# Patient Record
Sex: Female | Born: 1955 | Race: White | Hispanic: No | Smoking: Former smoker
Health system: Southern US, Community
[De-identification: ages and names within clinical notes are randomized; demographics above are authoritative.]

## PROBLEM LIST (undated history)

## (undated) DIAGNOSIS — I5189 Other ill-defined heart diseases: Secondary | ICD-10-CM

## (undated) DIAGNOSIS — R0789 Other chest pain: Secondary | ICD-10-CM

## (undated) DIAGNOSIS — Z8489 Family history of other specified conditions: Secondary | ICD-10-CM

## (undated) DIAGNOSIS — F1090 Alcohol use, unspecified, uncomplicated: Secondary | ICD-10-CM

## (undated) DIAGNOSIS — I872 Venous insufficiency (chronic) (peripheral): Secondary | ICD-10-CM

## (undated) DIAGNOSIS — I779 Disorder of arteries and arterioles, unspecified: Secondary | ICD-10-CM

## (undated) DIAGNOSIS — F40240 Claustrophobia: Secondary | ICD-10-CM

## (undated) DIAGNOSIS — K7689 Other specified diseases of liver: Secondary | ICD-10-CM

## (undated) DIAGNOSIS — R011 Cardiac murmur, unspecified: Secondary | ICD-10-CM

## (undated) DIAGNOSIS — D649 Anemia, unspecified: Secondary | ICD-10-CM

## (undated) DIAGNOSIS — I499 Cardiac arrhythmia, unspecified: Secondary | ICD-10-CM

## (undated) DIAGNOSIS — J189 Pneumonia, unspecified organism: Secondary | ICD-10-CM

## (undated) DIAGNOSIS — I2510 Atherosclerotic heart disease of native coronary artery without angina pectoris: Secondary | ICD-10-CM

## (undated) DIAGNOSIS — K5190 Ulcerative colitis, unspecified, without complications: Secondary | ICD-10-CM

## (undated) HISTORY — DX: Other ill-defined heart diseases: I51.89

## (undated) HISTORY — PX: FOOT SURGERY: SHX648

## (undated) HISTORY — DX: Venous insufficiency (chronic) (peripheral): I87.2

## (undated) HISTORY — DX: Other chest pain: R07.89

## (undated) HISTORY — DX: Ulcerative colitis, unspecified, without complications: K51.90

## (undated) HISTORY — PX: MOUTH SURGERY: SHX715

## (undated) HISTORY — PX: COLON SURGERY: SHX602

## (undated) HISTORY — PX: COLOSTOMY TAKEDOWN: SHX5783

## (undated) HISTORY — DX: Disorder of arteries and arterioles, unspecified: I77.9

## (undated) SURGERY — LAPAROSCOPIC CHOLECYSTECTOMY
Anesthesia: Choice

---

## 1989-09-21 HISTORY — PX: MANDIBLE FRACTURE SURGERY: SHX706

## 2004-11-09 ENCOUNTER — Emergency Department: Payer: Self-pay | Admitting: Emergency Medicine

## 2005-10-09 ENCOUNTER — Emergency Department: Payer: Self-pay | Admitting: Internal Medicine

## 2005-10-09 ENCOUNTER — Other Ambulatory Visit: Payer: Self-pay

## 2006-12-18 ENCOUNTER — Emergency Department: Payer: Self-pay | Admitting: Emergency Medicine

## 2007-04-02 ENCOUNTER — Ambulatory Visit: Payer: Self-pay | Admitting: Family Medicine

## 2009-01-21 ENCOUNTER — Emergency Department: Payer: Self-pay | Admitting: Emergency Medicine

## 2010-09-11 ENCOUNTER — Ambulatory Visit: Payer: Self-pay | Admitting: Obstetrics and Gynecology

## 2010-09-18 ENCOUNTER — Ambulatory Visit: Payer: Self-pay | Admitting: Obstetrics and Gynecology

## 2010-11-21 ENCOUNTER — Ambulatory Visit: Payer: Self-pay | Admitting: Unknown Physician Specialty

## 2011-10-08 ENCOUNTER — Ambulatory Visit: Payer: Self-pay | Admitting: Unknown Physician Specialty

## 2011-10-14 ENCOUNTER — Ambulatory Visit: Payer: Self-pay | Admitting: Obstetrics and Gynecology

## 2011-11-02 ENCOUNTER — Ambulatory Visit: Payer: Self-pay | Admitting: Surgery

## 2011-11-02 HISTORY — PX: BREAST BIOPSY: SHX20

## 2011-11-07 ENCOUNTER — Emergency Department: Payer: Self-pay | Admitting: Emergency Medicine

## 2012-12-09 ENCOUNTER — Other Ambulatory Visit: Payer: Self-pay | Admitting: Unknown Physician Specialty

## 2012-12-12 ENCOUNTER — Ambulatory Visit: Payer: Self-pay | Admitting: Unknown Physician Specialty

## 2013-01-05 ENCOUNTER — Ambulatory Visit: Payer: Self-pay | Admitting: Unknown Physician Specialty

## 2013-02-21 ENCOUNTER — Other Ambulatory Visit: Payer: Self-pay | Admitting: Unknown Physician Specialty

## 2013-02-21 LAB — CLOSTRIDIUM DIFFICILE BY PCR

## 2013-02-24 LAB — STOOL CULTURE

## 2013-08-22 DIAGNOSIS — K5190 Ulcerative colitis, unspecified, without complications: Secondary | ICD-10-CM | POA: Insufficient documentation

## 2014-02-23 ENCOUNTER — Emergency Department: Payer: Self-pay | Admitting: Emergency Medicine

## 2014-02-23 LAB — URINALYSIS, COMPLETE
BILIRUBIN, UR: NEGATIVE
BLOOD: NEGATIVE
Bacteria: NONE SEEN
Glucose,UR: NEGATIVE mg/dL (ref 0–75)
Ketone: NEGATIVE
Nitrite: NEGATIVE
Ph: 6 (ref 4.5–8.0)
Protein: NEGATIVE
Specific Gravity: 1.016 (ref 1.003–1.030)
Squamous Epithelial: 2

## 2014-02-23 LAB — CBC WITH DIFFERENTIAL/PLATELET
Basophil #: 0.1 10*3/uL (ref 0.0–0.1)
Basophil %: 0.5 %
EOS ABS: 0.1 10*3/uL (ref 0.0–0.7)
EOS PCT: 0.8 %
HCT: 33.8 % — AB (ref 35.0–47.0)
HGB: 10.7 g/dL — ABNORMAL LOW (ref 12.0–16.0)
LYMPHS ABS: 1.5 10*3/uL (ref 1.0–3.6)
LYMPHS PCT: 10.6 %
MCH: 25.7 pg — ABNORMAL LOW (ref 26.0–34.0)
MCHC: 31.5 g/dL — AB (ref 32.0–36.0)
MCV: 81 fL (ref 80–100)
MONOS PCT: 10.7 %
Monocyte #: 1.5 x10 3/mm — ABNORMAL HIGH (ref 0.2–0.9)
NEUTROS ABS: 11 10*3/uL — AB (ref 1.4–6.5)
Neutrophil %: 77.4 %
PLATELETS: 284 10*3/uL (ref 150–440)
RBC: 4.15 10*6/uL (ref 3.80–5.20)
RDW: 18 % — ABNORMAL HIGH (ref 11.5–14.5)
WBC: 14.3 10*3/uL — AB (ref 3.6–11.0)

## 2014-02-23 LAB — BASIC METABOLIC PANEL
Anion Gap: 4 — ABNORMAL LOW (ref 7–16)
BUN: 19 mg/dL — ABNORMAL HIGH (ref 7–18)
CO2: 29 mmol/L (ref 21–32)
Calcium, Total: 8.9 mg/dL (ref 8.5–10.1)
Chloride: 99 mmol/L (ref 98–107)
Creatinine: 0.63 mg/dL (ref 0.60–1.30)
EGFR (African American): >60
GLUCOSE: 123 mg/dL — AB (ref 65–99)
OSMOLALITY: 268 (ref 275–301)
Potassium: 3.5 mmol/L (ref 3.5–5.1)
Sodium: 132 mmol/L — ABNORMAL LOW (ref 136–145)

## 2014-02-24 ENCOUNTER — Ambulatory Visit (HOSPITAL_COMMUNITY)
Admission: AD | Admit: 2014-02-24 | Payer: Federal, State, Local not specified - PPO | Source: Ambulatory Visit | Admitting: *Deleted

## 2014-02-24 ENCOUNTER — Ambulatory Visit (HOSPITAL_COMMUNITY)
Admission: AD | Admit: 2014-02-24 | Discharge: 2014-02-24 | Disposition: A | Discharge status: Home / Self Care | Payer: Federal, State, Local not specified - PPO | Source: Ambulatory Visit | Attending: Colon and Rectal Surgery | Admitting: Colon and Rectal Surgery

## 2014-02-24 DIAGNOSIS — L0291 Cutaneous abscess, unspecified: Secondary | ICD-10-CM | POA: Insufficient documentation

## 2014-02-24 DIAGNOSIS — Z9889 Other specified postprocedural states: Secondary | ICD-10-CM | POA: Insufficient documentation

## 2014-02-24 DIAGNOSIS — L0390 Cellulitis, unspecified: Secondary | ICD-10-CM

## 2014-02-26 ENCOUNTER — Emergency Department: Payer: Self-pay | Admitting: Emergency Medicine

## 2014-02-26 LAB — COMPREHENSIVE METABOLIC PANEL
ALBUMIN: 2.7 g/dL — AB (ref 3.4–5.0)
Alkaline Phosphatase: 108 U/L
Anion Gap: 3 — ABNORMAL LOW (ref 7–16)
BILIRUBIN TOTAL: 0.2 mg/dL (ref 0.2–1.0)
BUN: 14 mg/dL (ref 7–18)
CALCIUM: 9 mg/dL (ref 8.5–10.1)
CREATININE: 0.9 mg/dL (ref 0.60–1.30)
Chloride: 109 mmol/L — ABNORMAL HIGH (ref 98–107)
Co2: 29 mmol/L (ref 21–32)
EGFR (African American): >60
Glucose: 95 mg/dL (ref 65–99)
OSMOLALITY: 282 (ref 275–301)
POTASSIUM: 4.1 mmol/L (ref 3.5–5.1)
SGOT(AST): 22 U/L (ref 15–37)
SGPT (ALT): 13 U/L (ref 12–78)
Sodium: 141 mmol/L (ref 136–145)
TOTAL PROTEIN: 7.2 g/dL (ref 6.4–8.2)

## 2014-02-26 LAB — CBC WITH DIFFERENTIAL/PLATELET
BASOS ABS: 0.1 10*3/uL (ref 0.0–0.1)
Basophil %: 1.1 %
EOS PCT: 5.8 %
Eosinophil #: 0.5 10*3/uL (ref 0.0–0.7)
HCT: 35 % (ref 35.0–47.0)
HGB: 11.3 g/dL — ABNORMAL LOW (ref 12.0–16.0)
LYMPHS PCT: 20.7 %
Lymphocyte #: 1.7 10*3/uL (ref 1.0–3.6)
MCH: 26 pg (ref 26.0–34.0)
MCHC: 32.3 g/dL (ref 32.0–36.0)
MCV: 81 fL (ref 80–100)
Monocyte #: 1 x10 3/mm — ABNORMAL HIGH (ref 0.2–0.9)
Monocyte %: 12.6 %
NEUTROS PCT: 59.8 %
Neutrophil #: 4.8 10*3/uL (ref 1.4–6.5)
PLATELETS: 376 10*3/uL (ref 150–440)
RBC: 4.34 10*6/uL (ref 3.80–5.20)
RDW: 18.2 % — AB (ref 11.5–14.5)
WBC: 8 10*3/uL (ref 3.6–11.0)

## 2014-02-28 LAB — CULTURE, BLOOD (SINGLE)

## 2014-03-03 LAB — CULTURE, BLOOD (SINGLE)

## 2015-01-13 NOTE — Op Note (Signed)
PATIENT NAME:  Haley Deleon, Haley Deleon MR#:  [id number] DATE OF BIRTH:  [birth year]  DATE OF PROCEDURE:  11/02/2011  PREOPERATIVE DIAGNOSIS: Right breast mass.   POSTOPERATIVE DIAGNOSIS: Right breast mass.   PROCEDURE: Excision right breast mass.   SURGEON: Loreli Dollar, MD  ANESTHESIA: General.   INDICATIONS: This 59 year old female had ultrasound findings of approximately 4 mm solid nodule at the 12 o'clock position of the right breast near the border of the areola. This had been persistent with follow-up ultrasound and excision was elected. She had preoperative ultrasound-guided insertion of Kopans wire with follow-up mammogram. The ultrasound and mammograms were viewed.  DESCRIPTION OF PROCEDURE: The patient was placed on the operating table in the supine position under general anesthesia. The dressing was removed from the right breast exposing the Kopans wire in the upper aspect of the right breast just above the border of the areola. The wire was cut 3 cm from the skin. The breast was prepared with ChloraPrep, draped in a sterile manner.   Ultrasound probe was used in a sterile sleeve and identified the 4 mm nodule adjacent to the wire. It appeared to be approximately 1.5 cm deep. Next, transversely oriented curvilinear incision was made near the border of the areola approximately 3 cm in length, carried down through subcutaneous tissues and encountered the wire. Next, the nodule was further demonstrated with ultrasound. It is noted that two traversing veins were divided between 4-0 chromic suture ligatures. A sample of tissue surrounding the wire was dissected free from surrounding structures. The sample of tissue was approximately 2.5 x 2.5 x 2.5 cm in dimension and was resected. Next, ultrasound was used to examine the specimen. Could identify the small nodule and also could palpate a firm nodule within the tissues and this was submitted in formalin for routine pathology. The background was  examined. Did not identify any other nodule in the immediate area. Next, the site was further infiltrated with 0.5% Sensorcaine with epinephrine. Hemostasis was intact. The wound was closed with a running 4-0 chromic subcuticular suture and Dermabond.   The patient tolerated the procedure satisfactorily and was then prepared for transfer to the recovery room.  ____________________________ Lenna Sciara. Rochel Brome, MD jws:cms D: 11/02/2011 11:33:56 ET T: 11/02/2011 11:46:47 ET JOB#: [id number]  cc: Loreli Dollar, MD, <Dictator> Loreli Dollar MD ELECTRONICALLY SIGNED 11/07/2011 12:33

## 2015-06-11 ENCOUNTER — Other Ambulatory Visit: Payer: Self-pay | Admitting: Physician Assistant

## 2015-06-11 DIAGNOSIS — M79605 Pain in left leg: Secondary | ICD-10-CM

## 2015-06-11 DIAGNOSIS — M7989 Other specified soft tissue disorders: Secondary | ICD-10-CM

## 2015-06-12 ENCOUNTER — Ambulatory Visit: Payer: Federal, State, Local not specified - PPO

## 2015-06-13 ENCOUNTER — Ambulatory Visit: Payer: Federal, State, Local not specified - PPO

## 2015-06-13 ENCOUNTER — Ambulatory Visit
Admission: RE | Admit: 2015-06-13 | Discharge: 2015-06-13 | Disposition: A | Discharge status: Home / Self Care | Payer: Federal, State, Local not specified - PPO | Source: Ambulatory Visit | Attending: Physician Assistant | Admitting: Physician Assistant

## 2015-06-13 DIAGNOSIS — M79605 Pain in left leg: Secondary | ICD-10-CM | POA: Insufficient documentation

## 2015-06-13 DIAGNOSIS — M7989 Other specified soft tissue disorders: Secondary | ICD-10-CM

## 2015-07-29 ENCOUNTER — Other Ambulatory Visit: Payer: Self-pay | Admitting: Internal Medicine

## 2015-07-29 DIAGNOSIS — Z1231 Encounter for screening mammogram for malignant neoplasm of breast: Secondary | ICD-10-CM

## 2015-08-12 ENCOUNTER — Ambulatory Visit
Admission: RE | Admit: 2015-08-12 | Discharge: 2015-08-12 | Disposition: A | Discharge status: Home / Self Care | Payer: Federal, State, Local not specified - PPO | Source: Ambulatory Visit | Attending: Internal Medicine | Admitting: Internal Medicine

## 2015-08-12 DIAGNOSIS — Z1231 Encounter for screening mammogram for malignant neoplasm of breast: Secondary | ICD-10-CM | POA: Diagnosis present

## 2015-09-19 ENCOUNTER — Emergency Department
Admission: EM | Admit: 2015-09-19 | Discharge: 2015-09-19 | Disposition: A | Discharge status: Home / Self Care | Payer: Federal, State, Local not specified - PPO | Attending: Emergency Medicine | Admitting: Emergency Medicine

## 2015-09-19 ENCOUNTER — Emergency Department: Payer: Federal, State, Local not specified - PPO

## 2015-09-19 ENCOUNTER — Encounter: Payer: Self-pay | Admitting: Emergency Medicine

## 2015-09-19 DIAGNOSIS — S60042A Contusion of left ring finger without damage to nail, initial encounter: Secondary | ICD-10-CM | POA: Diagnosis not present

## 2015-09-19 DIAGNOSIS — S60032A Contusion of left middle finger without damage to nail, initial encounter: Secondary | ICD-10-CM | POA: Diagnosis not present

## 2015-09-19 DIAGNOSIS — Y990 Civilian activity done for income or pay: Secondary | ICD-10-CM | POA: Diagnosis not present

## 2015-09-19 DIAGNOSIS — W231XXA Caught, crushed, jammed, or pinched between stationary objects, initial encounter: Secondary | ICD-10-CM | POA: Insufficient documentation

## 2015-09-19 DIAGNOSIS — S6000XA Contusion of unspecified finger without damage to nail, initial encounter: Secondary | ICD-10-CM

## 2015-09-19 DIAGNOSIS — Z87891 Personal history of nicotine dependence: Secondary | ICD-10-CM | POA: Diagnosis not present

## 2015-09-19 DIAGNOSIS — Y9389 Activity, other specified: Secondary | ICD-10-CM | POA: Diagnosis not present

## 2015-09-19 DIAGNOSIS — Y9289 Other specified places as the place of occurrence of the external cause: Secondary | ICD-10-CM | POA: Diagnosis not present

## 2015-09-19 DIAGNOSIS — S6992XA Unspecified injury of left wrist, hand and finger(s), initial encounter: Secondary | ICD-10-CM | POA: Diagnosis present

## 2015-09-19 MED ORDER — NAPROXEN 500 MG PO TABS
500.0000 mg | ORAL_TABLET | Freq: Once | ORAL | Status: DC
  Filled 2015-09-19: qty 1

## 2015-09-19 NOTE — ED Notes (Signed)
States she shut her hand ina door at work   Pain to left 3rd and 4 th fingers

## 2015-09-19 NOTE — Discharge Instructions (Signed)

## 2015-09-19 NOTE — ED Provider Notes (Signed)
[facility] Emergency Department Provider Note ?  ? ____________________________________________ ? Time seen: 7:00 PM ? I have reviewed the triage vital signs and the nursing notes.  ________ HISTORY ? Chief Complaint Hand Pain     HPI  Haley Deleon is a 59 y.o. female   who presents emergency department complaining of left hand/finger pain. She states that she smashed her left fourth digit between a door and door jamb at work today. She states the door was closing while her hand was going on to the [place]. She states that she has pain on the proximal phalanx of digits. She has full range of motion to same. She does endorse some mild edema and ecchymosis to fingers. No other injury or complaint at this time. ? ? ? History reviewed. No pertinent past medical history.  There are no active problems to display for this patient.  ? Past Surgical History  Procedure Laterality Date  . Breast biopsy Right 11/02/2011    ultrasound guided biopsy  . Colon surgery     ? No current outpatient prescriptions on file. ? Allergies Caffeine and Chocolate ? No family history on file. ? Social History Social History  Substance Use Topics  . Smoking status: Former Research scientist (life sciences)  . Smokeless tobacco: None  . Alcohol Use: No   ? Review of Systems Constitutional: no fever. Eyes: no discharge ENT: no sore throat. Cardiovascular: no chest pain. Respiratory: no cough. No sob Gastrointestinal: denies abdominal pain, vomiting, diarrhea, and constipation Genitourinary: no dysuria. Negative for hematuria Musculoskeletal: Negative for back pain. Endorses third and fourth digit pain left hand Skin: Negative for rash. Neurological: Negative for headaches  10-point ROS otherwise negative.  _______________ PHYSICAL EXAM: ? VITAL SIGNS:   ED Triage Vitals  Enc Vitals Group     BP 09/19/15 1721 155/74 mmHg     Pulse Rate 09/19/15 1721 73     Resp 09/19/15 1721 18    Temp 09/19/15 1721 98.1 F (36.7 C)     Temp Source 09/19/15 1721 Oral     SpO2 09/19/15 1721 99 %     Weight 09/19/15 1721 195 lb (88.451 kg)     Height 09/19/15 1721 5' 7.5" (1.715 m)     Head Cir --      Peak Flow --      Pain Score 09/19/15 1722 7     Pain Loc --      Pain Edu? --      Excl. in [place]? --    ?  Constitutional: Alert and oriented. Well appearing and in no distress. Eyes: Conjunctivae are normal.  ENT      Head: Normocephalic and atraumatic.      Ears:       Nose: No congestion/rhinnorhea.      Mouth/Throat: Mucous membranes are moist.       Hematological/Lymphatic/Immunilogical: No cervical lymphadenopathy. Cardiovascular: Normal rate, regular rhythm.  Respiratory: Normal respiratory effort without tachypnea nor retractions. Gastrointestinal: Soft and nontender. No distention. There is no CVA tenderness. Genitourinary:  Musculoskeletal: Nontender with normal range of motion in all extremities. No visible deformity to third and fourth digits left hand when compared with right. Minor edema and minor ecchymosis noted. Full range of motion to digits. Patient is tender to palpation over the distal phalanx. No palpable abnormality.  Neurologic:  Normal speech and language. No gross focal neurologic deficits are appreciated. Skin:  Skin is warm, dry and intact. No rash noted. Psychiatric: Mood and  affect are normal. Speech and behavior are normal. Patient exhibits appropriate insight and judgment.    ___________ RADIOLOGY  Left hand x-ray Impression: No evidence of fracture or dislocation.   I personally reviewed the images.  _____________ PROCEDURES ? Procedure(s) performed:    Medications  naproxen (NAPROSYN) tablet 500 mg (500 mg Oral Not Given 09/19/15 1824)    ______________________________________________________ INITIAL IMPRESSION / ASSESSMENT AND PLAN / ED COURSE ? Pertinent labs & imaging results that were available during my care of the  patient were reviewed by me and considered in my medical decision making (see chart for details).    She presents emergency department after having her fingers closed in a door at work. X-ray is negative for acute bony abnormality. Patient is diagnosed with contusion to the third and fourth digits. Patient is advised to continue to take anti-inflammatories. Patient does significant amount of typing at work and patient will be given off one day for recovery. Patient verbalizes understanding of diagnosis and treatment plan verbalizes compliance with same.    New Prescriptions   No medications on file   ____________________________________________ FINAL CLINICAL IMPRESSION(S) / ED DIAGNOSES?  Final diagnoses:  Finger contusion, initial encounter       Darletta Moll, PA-C 09/19/15 1900  Delman Kitten, MD 09/19/15 2053

## 2015-09-19 NOTE — ED Notes (Signed)
Pt comes into the ED via POC c/o hand pain after smashing her left middle and ring finger in a door at work.  Limited mobility in the two fingers with mild bruising.  Patient is no apparent distress at this time.

## 2016-03-18 ENCOUNTER — Ambulatory Visit: Payer: Self-pay

## 2016-03-18 ENCOUNTER — Encounter: Payer: Self-pay | Admitting: Podiatry

## 2016-03-18 ENCOUNTER — Ambulatory Visit (INDEPENDENT_AMBULATORY_CARE_PROVIDER_SITE_OTHER): Payer: Federal, State, Local not specified - PPO | Admitting: Podiatry

## 2016-03-18 VITALS — BP 128/82 | HR 66 | Resp 16

## 2016-03-18 DIAGNOSIS — Q828 Other specified congenital malformations of skin: Secondary | ICD-10-CM

## 2016-03-18 DIAGNOSIS — L600 Ingrowing nail: Secondary | ICD-10-CM

## 2016-03-18 NOTE — Progress Notes (Signed)
   Subjective:    Patient ID: Haley Deleon, female    DOB: [birth year], 60 y.o.   MRN: [id number]  HPI: She presents today with chief complaint of a painful ingrown nail which she has taken care of and it feels much better to the tibial border of the hallux/E states she pulled the edge out of and started packing it in salt seems to have gotten better and she is okay with that now however she is concerned about calluses plantar aspect of the bilateral foot and the fact that they continue to return after she has trimmed them herself.    Review of Systems  Allergic/Immunologic: Positive for food allergies.  All other systems reviewed and are negative.      Objective:   Physical Exam: STABLE SHE IS ALERT AND ORIENTED 3 PULSES ARE PALPABLE. NEUROLOGIC SENSORIUM IS INTACT. DEEP TENDON REFLEXES ARE INTACT. MUSCLE STRENGTH IS 5 OVER 5 DORSIFLEXION PLANTAR FLEXORS AND INVERTERS AND EVERTERS ALL OF HIS MUSCULATURE IS INTACT ORTHOPEDIC EVALUATION DEMONSTRATES MULTIPLE HAMMERTOE DEFORMITIES. CUTANEOUS EVALUATION WELL-HYDRATEDCUTISMULTIPLEPOROKERATOTICLESIONSDISTALASPECTOFTHETOESANDPLANTARASPECTOFTHEFOOT.        Assessment & Plan:  Painful porokeratosis ingrown nail hallux left which she has taken care of at this point.  Plan: I debrided all reactive hyperkeratoses for her today and I will follow-up with her on an as-needed basis for matrixectomy.

## 2016-03-19 NOTE — Addendum Note (Signed)
Addended by: Clovis Riley E on: 03/19/2016 05:06 PM   Modules accepted: Orders

## 2016-05-12 ENCOUNTER — Encounter: Payer: Self-pay | Admitting: Podiatry

## 2016-05-12 ENCOUNTER — Ambulatory Visit (INDEPENDENT_AMBULATORY_CARE_PROVIDER_SITE_OTHER): Payer: Federal, State, Local not specified - PPO | Admitting: Podiatry

## 2016-05-12 DIAGNOSIS — B351 Tinea unguium: Secondary | ICD-10-CM

## 2016-05-12 DIAGNOSIS — L600 Ingrowing nail: Secondary | ICD-10-CM

## 2016-05-12 NOTE — Patient Instructions (Signed)

## 2016-05-17 NOTE — Progress Notes (Signed)
Subjective: 60 year old female presents the also for concerns of ingrown toenail to the left big toe which is been ongoing for quite some time. She states that she tries to trim the area herself and does relieve the pain however discussed the point where she can no longer take care of it. She denies any draining or any redness swelling swelling. Denies any systemic complaints such as fevers, chills, nausea, vomiting. No acute changes since last appointment, and no other complaints at this time.   Objective: AAO x3, NAD DP/PT pulses palpable bilaterally, CRT less than 3 seconds There is evidence of incurvation along the medial nail border hallux toenail left side. There is localized edema to the area any erythema or increase in warmth. There is no ascending synovitis. There is no fluctuance or crepitus. No malodor. No tenderness either toenails. The nails, discolored hypertrophic. No edema, erythema, increase in warmth to bilateral lower extremities.  No open lesions or pre-ulcerative lesions.  No pain with calf compression, swelling, warmth, erythema  Assessment: Ingrown toenail left hallux toenail  Plan: -All treatment options discussed with the patient including all alternatives, risks, complications.  -At this time, the patient is requesting partial nail removal with chemical matricectomy to the symptomatic portion of the nail. Risks and complications were discussed with the patient for which they understand and  verbally consent to the procedure. Under sterile conditions a total of 3 mL of a mixture of 2% lidocaine plain and 0.5% Marcaine plain was infiltrated in a hallux block fashion. Once anesthetized, the skin was prepped in sterile fashion. A tourniquet was then applied. Next the symptomtatic aspect of hallux nail border was then sharply excised making sure to remove the entire offending nail border. Once the nails were ensured to be removed area was debrided and the underlying skin was  intact. There is no purulence identified in the procedure. Next phenol was then applied under standard conditions and copiously irrigated. Silvadene was applied. A dry sterile dressing was applied. After application of the dressing the tourniquet was removed and there is found to be an immediate capillary refill time to the digit. The patient tolerated the procedure well any complications. Post procedure instructions were discussed the patient for which he verbally understood. Follow-up in one week for nail check or sooner if any problems are to arise. Discussed signs/symptoms of infection and directed to call the office immediately should any occur or go directly to the emergency room. In the meantime, encouraged to call the office with any questions, concerns, changes symptoms. -Patient encouraged to call the office with any questions, concerns, change in symptoms.   Celesta Gentile, DPM

## 2016-05-19 ENCOUNTER — Encounter: Payer: Self-pay | Admitting: Podiatry

## 2016-05-19 ENCOUNTER — Ambulatory Visit (INDEPENDENT_AMBULATORY_CARE_PROVIDER_SITE_OTHER): Payer: Federal, State, Local not specified - PPO | Admitting: Podiatry

## 2016-05-19 DIAGNOSIS — L600 Ingrowing nail: Secondary | ICD-10-CM

## 2016-05-19 DIAGNOSIS — Z9889 Other specified postprocedural states: Secondary | ICD-10-CM

## 2016-05-19 NOTE — Patient Instructions (Signed)

## 2016-05-20 NOTE — Progress Notes (Signed)
Subjective: Haley Deleon is a 60 y.o.  Female returns to office today for follow up evaluation after having left Hallux partial nail avulsion performed. Patient has been soaking using epsom salts and applying topical antibiotic covered with bandaid daily. Patient denies fevers, chills, nausea, vomiting. Denies any calf pain, chest pain, SOB.   Objective:  Vitals: Reviewed  General: Well developed, nourished, in no acute distress, alert and oriented x3   Dermatology: Skin is warm, dry and supple bilateral. Left hallux nail border appears to be clean, dry, with mild granular tissue and surrounding scab. There is no surrounding erythema, edema, drainage/purulence. The remaining nails appear unremarkable at this time. There are no other lesions or other signs of infection present.  Neurovascular status: Intact. No lower extremity swelling; No pain with calf compression bilateral.  Musculoskeletal: Decreased tenderness to palpation of the left hallux nail fold. Muscular strength within normal limits bilateral.   Assesement and Plan: S/p partial nail avulsion, doing well.   -Continue soaking in epsom salts twice a day followed by antibiotic ointment and a band-aid. Can leave uncovered at night. Continue this until completely healed.  -If the area has not healed in 2 weeks, call the office for follow-up appointment, or sooner if any problems arise.  -Monitor for any signs/symptoms of infection. Call the office immediately if any occur or go directly to the emergency room. Call with any questions/concerns.  Celesta Gentile, DPM

## 2016-06-18 ENCOUNTER — Telehealth: Payer: Self-pay | Admitting: *Deleted

## 2016-06-18 NOTE — Telephone Encounter (Signed)
CALLED [facility] ABOUT THE MICROBIOLOGIC CULTURE AND THEY STATED THAT IT TAKES 30 DAYS FOR THAT TO GROW AND IT HAS NOT BEEN QUITE 69 DAYS YET AND WOULD LET us KNOW. Marielle Mantione

## 2016-06-29 ENCOUNTER — Telehealth: Payer: Self-pay | Admitting: *Deleted

## 2016-06-29 NOTE — Telephone Encounter (Signed)
Dr. Jacqualyn Posey reviewed 05/12/2016 fungal culture as negative, no treatment necessary.  Informed pt of results.

## 2017-03-19 ENCOUNTER — Other Ambulatory Visit: Payer: Self-pay | Admitting: Obstetrics and Gynecology

## 2017-03-19 DIAGNOSIS — Z1231 Encounter for screening mammogram for malignant neoplasm of breast: Secondary | ICD-10-CM

## 2017-04-12 ENCOUNTER — Ambulatory Visit
Admission: RE | Admit: 2017-04-12 | Discharge: 2017-04-12 | Disposition: A | Discharge status: Home / Self Care | Payer: Federal, State, Local not specified - PPO | Source: Ambulatory Visit | Attending: Obstetrics and Gynecology | Admitting: Obstetrics and Gynecology

## 2017-04-12 DIAGNOSIS — Z1231 Encounter for screening mammogram for malignant neoplasm of breast: Secondary | ICD-10-CM

## 2017-07-15 ENCOUNTER — Emergency Department: Payer: Federal, State, Local not specified - PPO

## 2017-07-15 ENCOUNTER — Encounter: Payer: Self-pay | Admitting: Emergency Medicine

## 2017-07-15 ENCOUNTER — Emergency Department
Admission: EM | Admit: 2017-07-15 | Discharge: 2017-07-15 | Disposition: A | Discharge status: Home / Self Care | Payer: Federal, State, Local not specified - PPO | Attending: Emergency Medicine | Admitting: Emergency Medicine

## 2017-07-15 DIAGNOSIS — Z87891 Personal history of nicotine dependence: Secondary | ICD-10-CM | POA: Diagnosis not present

## 2017-07-15 DIAGNOSIS — R1011 Right upper quadrant pain: Secondary | ICD-10-CM | POA: Diagnosis present

## 2017-07-15 DIAGNOSIS — Z79899 Other long term (current) drug therapy: Secondary | ICD-10-CM | POA: Insufficient documentation

## 2017-07-15 DIAGNOSIS — K8020 Calculus of gallbladder without cholecystitis without obstruction: Secondary | ICD-10-CM | POA: Diagnosis not present

## 2017-07-15 LAB — CBC
HCT: 46.3 % (ref 35.0–47.0)
Hemoglobin: 15.1 g/dL (ref 12.0–16.0)
MCH: 28 pg (ref 26.0–34.0)
MCHC: 32.6 g/dL (ref 32.0–36.0)
MCV: 85.8 fL (ref 80.0–100.0)
PLATELETS: 254 10*3/uL (ref 150–440)
RBC: 5.39 MIL/uL — AB (ref 3.80–5.20)
RDW: 14.3 % (ref 11.5–14.5)
WBC: 12.2 10*3/uL — AB (ref 3.6–11.0)

## 2017-07-15 LAB — COMPREHENSIVE METABOLIC PANEL
ALT: 16 U/L (ref 14–54)
AST: 23 U/L (ref 15–41)
Albumin: 3.8 g/dL (ref 3.5–5.0)
Alkaline Phosphatase: 98 U/L (ref 38–126)
Anion gap: 11 (ref 5–15)
BILIRUBIN TOTAL: 0.9 mg/dL (ref 0.3–1.2)
BUN: 14 mg/dL (ref 6–20)
CALCIUM: 9.4 mg/dL (ref 8.9–10.3)
CHLORIDE: 104 mmol/L (ref 101–111)
CO2: 26 mmol/L (ref 22–32)
Creatinine, Ser: 0.79 mg/dL (ref 0.44–1.00)
Glucose, Bld: 149 mg/dL — ABNORMAL HIGH (ref 65–99)
Potassium: 3.9 mmol/L (ref 3.5–5.1)
Sodium: 141 mmol/L (ref 135–145)
Total Protein: 8.1 g/dL (ref 6.5–8.1)

## 2017-07-15 LAB — URINALYSIS, COMPLETE (UACMP) WITH MICROSCOPIC
Bacteria, UA: NONE SEEN
Bilirubin Urine: NEGATIVE
GLUCOSE, UA: NEGATIVE mg/dL
Hgb urine dipstick: NEGATIVE
Ketones, ur: NEGATIVE mg/dL
LEUKOCYTES UA: NEGATIVE
Nitrite: NEGATIVE
PH: 5 (ref 5.0–8.0)
Protein, ur: NEGATIVE mg/dL
SPECIFIC GRAVITY, URINE: 1.018 (ref 1.005–1.030)

## 2017-07-15 LAB — TROPONIN I: Troponin I: <0.03 ng/mL (ref <0.03)

## 2017-07-15 LAB — LIPASE, BLOOD: LIPASE: 16 U/L (ref 11–51)

## 2017-07-15 MED ORDER — ONDANSETRON HCL 4 MG/2ML IJ SOLN
INTRAMUSCULAR | Status: AC
  Administered 2017-07-15: 4 mg via INTRAVENOUS
  Filled 2017-07-15: qty 2

## 2017-07-15 MED ORDER — KETOROLAC TROMETHAMINE 30 MG/ML IJ SOLN
15.0000 mg | Freq: Once | INTRAMUSCULAR | Status: AC
  Administered 2017-07-15: 15 mg via INTRAVENOUS

## 2017-07-15 MED ORDER — ONDANSETRON HCL 4 MG/2ML IJ SOLN
4.0000 mg | Freq: Once | INTRAMUSCULAR | Status: AC
  Administered 2017-07-15: 4 mg via INTRAVENOUS

## 2017-07-15 MED ORDER — SODIUM CHLORIDE 0.9 % IV BOLUS (SEPSIS)
1000.0000 mL | Freq: Once | INTRAVENOUS | Status: AC
  Administered 2017-07-15: 1000 mL via INTRAVENOUS

## 2017-07-15 MED ORDER — KETOROLAC TROMETHAMINE 30 MG/ML IJ SOLN
INTRAMUSCULAR | Status: AC
  Administered 2017-07-15: 15 mg via INTRAVENOUS
  Filled 2017-07-15: qty 1

## 2017-07-15 MED ORDER — ONDANSETRON 4 MG PO TBDP: 4.0000 mg | ORAL_TABLET | Freq: Three times a day (TID) | ORAL | 0 refills | Status: DC | PRN

## 2017-07-15 NOTE — ED Notes (Signed)
ED Provider at bedside. 

## 2017-07-15 NOTE — ED Notes (Addendum)
Pt c/o right side abdominal pain and nausea since last night. Pt still has gallbladder and appendix. Pt had colon resection in 2014. Pt states she had episode of diarrhea since arriving to room, but states her BM was normal.

## 2017-07-15 NOTE — ED Provider Notes (Signed)
[facility] Emergency Department Provider Note  ____________________________________________  Time seen: Approximately 2:23 PM  I have reviewed the triage vital signs and the nursing notes.   HISTORY  Chief Complaint Emesis and Abdominal Pain   HPI Haley Deleon is a 61 y.o. female with a history of ulcerative colitis status post total colectomy who presents for evaluation of abdominal pain. Patient reports that her pain started yesterday evening. She describes it as sharp, initially severe, located in the right upper quadrant, associated with nausea and several episodes of nonbloody nonbilious emesis. The pain was persistent throughout the night and is currently 6 out of 10. This morning she noted that her stool was watery. She does have a history of soft stools due to her colectomy however since this morning's been pure water. No blood, no melena, no hematemesis, no coffee-ground emesis, no hematochezia. She has had chills but no fever. Patient denies chest pain, shortness of breath, or cough. No dysuria or hematuria.  History reviewed. No pertinent past medical history.  Patient Active Problem List   Diagnosis Date Noted  . History of biliary T-tube placement 02/24/2014  . Cellulitis 02/24/2014  . Ulcerative colitis ([place]) 08/22/2013    Past Surgical History:  Procedure Laterality Date  . BREAST BIOPSY Right 11/02/2011   ultrasound guided biopsy/clip-benign  . COLON SURGERY      Prior to Admission medications   Medication Sig Start Date End Date Taking? Authorizing Provider  ferrous sulfate 325 (65 FE) MG tablet Take 325 mg by mouth.    [provider]  ibuprofen (GOODSENSE IBUPROFEN) 200 MG tablet Take 200 mg by mouth.    [provider]  loperamide (IMODIUM) 2 MG capsule Take 2 mg by mouth.    [provider]  Multiple Vitamin (MULTIVITAMIN) tablet Take 1 tablet by mouth daily.    [provider]  ondansetron  (ZOFRAN ODT) 4 MG disintegrating tablet Take 1 tablet (4 mg total) by mouth every 8 (eight) hours as needed for nausea or vomiting. 07/15/17   Rudene Re, MD    Allergies Caffeine and Chocolate  Family History  Problem Relation Age of Onset  . Breast cancer Neg Hx     Social History Social History  Substance Use Topics  . Smoking status: Former Research scientist (life sciences)  . Smokeless tobacco: Never Used  . Alcohol use No    Review of Systems  Constitutional: Negative for fever. + chills Eyes: Negative for visual changes. ENT: Negative for sore throat. Neck: No neck pain  Cardiovascular: Negative for chest pain. Respiratory: Negative for shortness of breath. Gastrointestinal: + RUQ abdominal pain, vomiting and diarrhea. Genitourinary: Negative for dysuria. Musculoskeletal: Negative for back pain. Skin: Negative for rash. Neurological: Negative for headaches, weakness or numbness. Psych: No SI or HI  ____________________________________________   PHYSICAL EXAM:  VITAL SIGNS: ED Triage Vitals  Enc Vitals Group     BP 07/15/17 1139 (!) 141/87     Pulse Rate 07/15/17 1139 90     Resp 07/15/17 1139 20     Temp 07/15/17 1139 98 F (36.7 C)     Temp Source 07/15/17 1139 Oral     SpO2 07/15/17 1139 96 %     Weight 07/15/17 1136 195 lb (88.5 kg)     Height --      Head Circumference --      Peak Flow --      Pain Score 07/15/17 1136 10     Pain Loc --  Pain Edu? --      Excl. in [place]? --     Constitutional: Alert and oriented. Well appearing and in no apparent distress. HEENT:      Head: Normocephalic and atraumatic.         Eyes: Conjunctivae are normal. Sclera is non-icteric.       Mouth/Throat: Mucous membranes are moist.       Neck: Supple with no signs of meningismus. Cardiovascular: Regular rate and rhythm. No murmurs, gallops, or rubs. 2+ symmetrical distal pulses are present in all extremities. No JVD. Respiratory: Normal respiratory effort. Lungs are clear to  auscultation bilaterally. No wheezes, crackles, or rhonchi.  Gastrointestinal: Soft, ttp over the RUQ with negative Murphy's sign, and non distended with positive bowel sounds. No rebound or guarding. Genitourinary: No CVA tenderness. Musculoskeletal: Nontender with normal range of motion in all extremities. No edema, cyanosis, or erythema of extremities. Neurologic: Normal speech and language. Face is symmetric. Moving all extremities. No gross focal neurologic deficits are appreciated. Skin: Skin is warm, dry and intact. No rash noted. Psychiatric: Mood and affect are normal. Speech and behavior are normal.  ____________________________________________   LABS (all labs ordered are listed, but only abnormal results are displayed)  Labs Reviewed  COMPREHENSIVE METABOLIC PANEL - Abnormal; Notable for the following:       Result Value   Glucose, Bld 149 (*)    All other components within normal limits  CBC - Abnormal; Notable for the following:    WBC 12.2 (*)    RBC 5.39 (*)    All other components within normal limits  URINALYSIS, COMPLETE (UACMP) WITH MICROSCOPIC - Abnormal; Notable for the following:    Color, Urine YELLOW (*)    APPearance CLEAR (*)    Squamous Epithelial / LPF 0-5 (*)    All other components within normal limits  LIPASE, BLOOD  TROPONIN I   ____________________________________________  EKG  ED ECG REPORT I, Rudene Re, the attending physician, personally viewed and interpreted this ECG.  Normal sinus rhythm, rate of 89, normal intervals, normal axis, no ST elevations or depressions, low voltage QRS.no significant changes when compared to prior from 2007 ____________________________________________  RADIOLOGY  RUQ [place]: cholelithiasis with no evidence of cholecystitis  ____________________________________________   PROCEDURES  Procedure(s) performed: None Procedures Critical Care performed:   None ____________________________________________   INITIAL IMPRESSION / ASSESSMENT AND PLAN / ED COURSE  61 y.o. female with a history of ulcerative colitis status post total colectomy who presents for evaluation of RUQ sharp constant abdominal pain since last night associated with nausea, chills, NBNB emesis and this morning having watery stools. patient is well-appearing, in no distress, has normal vital signs, abdomen shows tenderness to palpation in the right upper quadrant, negative Murphy sign, no rebound or guarding, positive bowel sounds with no clinical evidence of SBO. Differential diagnoses including food poisoning versus gastroenteritis versus gallbladder disease versus kidney stone. Labs show a slightly elevated white count of 12.2. Normal CMP and lipase.UA with no blood or infection. An EKG and troponin with no evidence of ischemia. We'll give IV fluids, Toradol, Zofran, and sent patient for right upper quadrant ultrasound.    _________________________ 3:15 PM on 07/15/2017 -----------------------------------------  ultrasound concerning for cholelithiasis with no evidence of cholecystitis. [name] pain has fully resolved after IV Toradol. She has no tenderness on exam. She is tolerating by mouth. We'll refer to Dr. Dahlia Byes at the request of the patient for outpatient management. Discussed return precautions for pain that  returns or doesn't resolve after 1-2 hours, significant nausea vomiting, or fever. Otherwise discussed the importance of avoiding fatty foods.   As part of my medical decision making, I reviewed the following data within the [place] notes reviewed and incorporated, Labs reviewed , EKG interpreted , Old EKG reviewed, Old chart reviewed, Radiograph reviewed , Notes from prior ED visits and [place] Controlled Substance Database    Pertinent labs & imaging results that were available during my care of the patient were reviewed by me and  considered in my medical decision making (see chart for details).    ____________________________________________   FINAL CLINICAL IMPRESSION(S) / ED DIAGNOSES  Final diagnoses:  RUQ abdominal pain  Calculus of gallbladder without cholecystitis without obstruction      NEW MEDICATIONS STARTED DURING THIS VISIT:  New Prescriptions   ONDANSETRON (ZOFRAN ODT) 4 MG DISINTEGRATING TABLET    Take 1 tablet (4 mg total) by mouth every 8 (eight) hours as needed for nausea or vomiting.     Note:  This document was prepared using Dragon voice recognition software and may include unintentional dictation errors.    Rudene Re, MD 07/15/17 [id number]

## 2017-07-15 NOTE — ED Triage Notes (Signed)
Pt reports abd pain and emesis started last night.

## 2017-07-26 ENCOUNTER — Encounter: Payer: Self-pay | Admitting: Surgery

## 2017-07-26 ENCOUNTER — Ambulatory Visit (INDEPENDENT_AMBULATORY_CARE_PROVIDER_SITE_OTHER): Payer: Federal, State, Local not specified - PPO | Admitting: Surgery

## 2017-07-26 VITALS — BP 121/75 | HR 73 | Temp 97.1°F | Ht 67.5 in | Wt 250.0 lb

## 2017-07-26 DIAGNOSIS — K8020 Calculus of gallbladder without cholecystitis without obstruction: Secondary | ICD-10-CM

## 2017-07-26 DIAGNOSIS — K432 Incisional hernia without obstruction or gangrene: Secondary | ICD-10-CM

## 2017-07-26 NOTE — Patient Instructions (Addendum)
You have requested to have your gallbladder removed. This will be done on 07/29/17 at [facility] with Dr. Dahlia Byes.  You will most likely be out of work 1-2 weeks for this surgery. You will return after your post-op appointment with a lifting restriction for approximately 4 more weeks.  You will be able to eat anything you would like to following surgery. But, start by eating a bland diet and advance this as tolerated. The Gallbladder diet is below, please go as closely by this diet as possible prior to surgery to avoid any further attacks.  Please see the (blue)pre-care form that you have been given today. If you have any questions, please call our office.  Laparoscopic Cholecystectomy Laparoscopic cholecystectomy is surgery to remove the gallbladder. The gallbladder is located in the upper right part of the abdomen, behind the liver. It is a storage sac for bile, which is produced in the liver. Bile aids in the digestion and absorption of fats. Cholecystectomy is often done for inflammation of the gallbladder (cholecystitis). This condition is usually caused by a buildup of gallstones (cholelithiasis) in the gallbladder. Gallstones can block the flow of bile, and that can result in inflammation and pain. In severe cases, emergency surgery may be required. If emergency surgery is not required, you will have time to prepare for the procedure. Laparoscopic surgery is an alternative to open surgery. Laparoscopic surgery has a shorter recovery time. Your common bile duct may also need to be examined during the procedure. If stones are found in the common bile duct, they may be removed. LET [facility] CARE PROVIDER KNOW ABOUT:  Any allergies you have.  All medicines you are taking, including vitamins, herbs, eye drops, creams, and over-the-counter medicines.  Previous problems you or members of your family have had with the use of anesthetics.  Any blood disorders you have.  Previous surgeries  you have had.    Any medical conditions you have. RISKS AND COMPLICATIONS Generally, this is a safe procedure. However, problems may occur, including:  Infection.  Bleeding.  Allergic reactions to medicines.  Damage to other structures or organs.  A stone remaining in the common bile duct.  A bile leak from the cyst duct that is clipped when your gallbladder is removed.  The need to convert to open surgery, which requires a larger incision in the abdomen. This may be necessary if your surgeon thinks that it is not safe to continue with a laparoscopic procedure. BEFORE THE PROCEDURE  Ask your health care provider about:  Changing or stopping your regular medicines. This is especially important if you are taking diabetes medicines or blood thinners.  Taking medicines such as aspirin and ibuprofen. These medicines can thin your blood. Do not take these medicines before your procedure if your health care provider instructs you not to.  Follow instructions from your health care provider about eating or drinking restrictions.  Let your health care provider know if you develop a cold or an infection before surgery.  Plan to have someone take you home after the procedure.  Ask your health care provider how your surgical site will be marked or identified.  You may be given antibiotic medicine to help prevent infection. PROCEDURE  To reduce your risk of infection:  Your health care team will wash or sanitize their hands.  Your skin will be washed with soap.  An IV tube may be inserted into one of your veins.  You will be given a medicine to make  you fall asleep (general anesthetic).  A breathing tube will be placed in your mouth.  The surgeon will make several small cuts (incisions) in your abdomen.  A thin, lighted tube (laparoscope) that has a tiny camera on the end will be inserted through one of the small incisions. The camera on the laparoscope will send a picture to  a TV screen (monitor) in the operating room. This will give the surgeon a good view inside your abdomen.  A gas will be pumped into your abdomen. This will expand your abdomen to give the surgeon more room to perform the surgery.  Other tools that are needed for the procedure will be inserted through the other incisions. The gallbladder will be removed through one of the incisions.  After your gallbladder has been removed, the incisions will be closed with stitches (sutures), staples, or skin glue.  Your incisions may be covered with a bandage (dressing). The procedure may vary among health care providers and hospitals. AFTER THE PROCEDURE  Your blood pressure, heart rate, breathing rate, and blood oxygen level will be monitored often until the medicines you were given have worn off.  You will be given medicines as needed to control your pain.   This information is not intended to replace advice given to you by your health care provider. Make sure you discuss any questions you have with your health care provider.   Document Released: 09/07/2005 Document Revised: 05/29/2015 Document Reviewed: 04/19/2013 Elsevier Interactive Patient Education 2016 [place] Diet for Gallbladder Conditions A low-fat diet can be helpful if you have pancreatitis or a gallbladder condition. With these conditions, your pancreas and gallbladder have trouble digesting fats. A healthy eating plan with less fat will help rest your pancreas and gallbladder and reduce your symptoms. WHAT DO I NEED TO KNOW ABOUT THIS DIET?  Eat a low-fat diet.  Reduce your fat intake to less than 20-30% of your total daily calories. This is less than 50-60 g of fat per day.  Remember that you need some fat in your diet. Ask your dietician what your daily goal should be.  Choose nonfat and low-fat healthy foods. Look for the words "nonfat," "low fat," or "fat free."  As a guide, look on the label and choose foods  with less than 3 g of fat per serving. Eat only one serving.  Avoid alcohol.  Do not smoke. If you need help quitting, talk with your health care provider.  Eat small frequent meals instead of three large heavy meals. WHAT FOODS CAN I EAT? Grains Include healthy grains and starches such as potatoes, wheat bread, fiber-rich cereal, and brown rice. Choose whole grain options whenever possible. In adults, whole grains should account for 45-65% of your daily calories.  Fruits and Vegetables Eat plenty of fruits and vegetables. Fresh fruits and vegetables add fiber to your diet. Meats and Other Protein Sources Eat lean meat such as chicken and pork. Trim any fat off of meat before cooking it. Eggs, fish, and beans are other sources of protein. In adults, these foods should account for 10-35% of your daily calories. Dairy Choose low-fat milk and dairy options. Dairy includes fat and protein, as well as calcium.  Fats and Oils Limit high-fat foods such as fried foods, sweets, baked goods, sugary drinks.  Other Creamy sauces and condiments, such as mayonnaise, can add extra fat. Think about whether or not you need to use them, or use smaller amounts or low fat options.  WHAT FOODS ARE NOT RECOMMENDED?  High fat foods, such as:  [employer].  Ice cream.  [place] toast.  Sweet rolls.  Pizza.  Cheese bread.  Foods covered with batter, butter, creamy sauces, or cheese.  Fried foods.  Sugary drinks and desserts.  Foods that cause gas or bloating   This information is not intended to replace advice given to you by your health care provider. Make sure you discuss any questions you have with your health care provider.   Document Released: 09/12/2013 Document Reviewed: 09/12/2013 Elsevier Interactive Patient Education [employer].

## 2017-07-27 ENCOUNTER — Encounter: Payer: Self-pay | Admitting: Surgery

## 2017-07-27 NOTE — Progress Notes (Signed)
Patient ID: Haley Deleon, female   DOB: Aug 20, 1956, 61 y.o.   MRN: [id number]  HPI Haley Deleon is a 61 y.o. female seen in consultation at the request of Dr. Alfred Levins. She does have significant surgical history significant for ulcerative colitis status post total abdominal colectomy with ileal J-pouch. She has had multiple major abdominal operations including a previous loop ileostomy takedown, multiple intra-abdominal abscesses requiring percutaneous drainage and prolonged antibiotics. She was previously on steroids for her use CPAP since she had the proctocolectomy she has been doing well. No evidence of bleeding. More recently sent to the emergency room around 10 days ago with abdominal pain nausea and vomiting. The pain was located in the right upper quadrant, was severe and sharp in nature. She was treated emergency room and responded well to IV narcotics. No fevers no chills no evidence of biliary obstruction. Ultrasound personally reviewed showing evidence of stones, no evidence of wall thickening. Normal common bile duct. White count was 12,000 and normal CMP. I have personally reviewed all the previous records from [facility] which she had previous abdominal operations. Is also a CT scan report from 2015 showing evidence of a ventral hernia near the umbilicus. For some reason there is a previous history in the computer consistent with T-tube placement. However the patient does not recall and looking at the previous CT scan I do not see any evidence of this.   HPI  History reviewed. No pertinent past medical history.  Past Surgical History:  Procedure Laterality Date  . BREAST BIOPSY Right 11/02/2011   ultrasound guided biopsy/clip-benign  . COLON SURGERY      Family History  Problem Relation Age of Onset  . Breast cancer Neg Hx     Social History Social History   Tobacco Use  . Smoking status: Former Research scientist (life sciences)  . Smokeless tobacco: Never Used  Substance Use Topics  .  Alcohol use: No  . Drug use: No    Allergies  Allergen Reactions  . Caffeine Anaphylaxis  . Chocolate Anaphylaxis  . Cocoa Anaphylaxis    Current Outpatient Medications  Medication Sig Dispense Refill  . ferrous sulfate 325 (65 FE) MG tablet Take 325 mg by mouth.    Marland Kitchen ibuprofen (GOODSENSE IBUPROFEN) 200 MG tablet Take 200 mg by mouth.    . loperamide (IMODIUM) 2 MG capsule Take 2 mg by mouth.    . Multiple Vitamin (MULTIVITAMIN) tablet Take 1 tablet by mouth daily.    . ondansetron (ZOFRAN ODT) 4 MG disintegrating tablet Take 1 tablet (4 mg total) by mouth every 8 (eight) hours as needed for nausea or vomiting. 20 tablet 0   No current facility-administered medications for this visit.      Review of Systems Full ROS  was asked and was negative except for the information on the HPI  Physical Exam Blood pressure 121/75, pulse 73, temperature (!) 97.1 F (36.2 C), temperature source Oral, height 5' 7.5" (1.715 m), weight 113.4 kg (250 lb). CONSTITUTIONAL: NAD obese female EYES: Pupils are equal, round, and reactive to light, Sclera are non-icteric. EARS, NOSE, MOUTH AND THROAT: The oropharynx is clear. The oral mucosa is pink and moist. Hearing is intact to voice. LYMPH NODES:  Lymph nodes in the neck are normal. RESPIRATORY:  Lungs are clear. There is normal respiratory effort, with equal breath sounds bilaterally, and without pathologic use of accessory muscles. CARDIOVASCULAR: Heart is regular without murmurs, gallops, or rubs. GI: The abdomen is soft, nontender, and nondistended.  Previous lower midline incision. Reducible ventral hernia periumbilical area There are no palpable masses. There is no hepatosplenomegaly. There are normal bowel sounds in all quadrants. GU: Rectal deferred.   MUSCULOSKELETAL: Normal muscle strength and tone. No cyanosis or edema.   SKIN: Turgor is good and there are no pathologic skin lesions or ulcers. NEUROLOGIC: Motor and sensation is grossly  normal. Cranial nerves are grossly intact. PSYCH:  Oriented to person, place and time. Affect is normal.  Data Reviewed  I have personally reviewed the patient's imaging, laboratory findings and medical records.    Assessment/Plan 61 yo female w symptomatic cholelithiasis as well as a small ventral hernia. Scars with the patient in detail about my recommendation for cholecystectomy and concomitant hernia repair. + or minus mesh placement depending on clinical condition. The risks, benefits, complications, treatment options, and expected outcomes were discussed with the patient. The possibilities of bleeding, recurrent infection, finding a normal gallbladder, perforation of viscus organs, damage to surrounding structures, bile leak, abscess formation, needing a drain placed, the need for additional procedures, reaction to medication, pulmonary aspiration,  failure to diagnose a condition, the possible need to convert to an open procedure, and creating a complication requiring transfusion or operation were discussed with the patient. The patient and/or family concurred with the proposed plan, giving informed consent.    Caroleen Hamman, MD FACS General Surgeon 07/27/2017, 12:28 PM

## 2017-07-27 NOTE — H&P (View-Only) (Signed)
Patient ID: Haley Deleon, female   DOB: 1956-07-14, 61 y.o.   MRN: [id number]  HPI Haley Deleon is a 61 y.o. female seen in consultation at the request of Dr. Alfred Levins. She does have significant surgical history significant for ulcerative colitis status post total abdominal colectomy with ileal J-pouch. She has had multiple major abdominal operations including a previous loop ileostomy takedown, multiple intra-abdominal abscesses requiring percutaneous drainage and prolonged antibiotics. She was previously on steroids for her use CPAP since she had the proctocolectomy she has been doing well. No evidence of bleeding. More recently sent to the emergency room around 10 days ago with abdominal pain nausea and vomiting. The pain was located in the right upper quadrant, was severe and sharp in nature. She was treated emergency room and responded well to IV narcotics. No fevers no chills no evidence of biliary obstruction. Ultrasound personally reviewed showing evidence of stones, no evidence of wall thickening. Normal common bile duct. White count was 12,000 and normal CMP. I have personally reviewed all the previous records from [facility] which she had previous abdominal operations. Is also a CT scan report from 2015 showing evidence of a ventral hernia near the umbilicus. For some reason there is a previous history in the computer consistent with T-tube placement. However the patient does not recall and looking at the previous CT scan I do not see any evidence of this.   HPI  History reviewed. No pertinent past medical history.  Past Surgical History:  Procedure Laterality Date  . BREAST BIOPSY Right 11/02/2011   ultrasound guided biopsy/clip-benign  . COLON SURGERY      Family History  Problem Relation Age of Onset  . Breast cancer Neg Hx     Social History Social History   Tobacco Use  . Smoking status: Former Research scientist (life sciences)  . Smokeless tobacco: Never Used  Substance Use Topics  .  Alcohol use: No  . Drug use: No    Allergies  Allergen Reactions  . Caffeine Anaphylaxis  . Chocolate Anaphylaxis  . Cocoa Anaphylaxis    Current Outpatient Medications  Medication Sig Dispense Refill  . ferrous sulfate 325 (65 FE) MG tablet Take 325 mg by mouth.    Marland Kitchen ibuprofen (GOODSENSE IBUPROFEN) 200 MG tablet Take 200 mg by mouth.    . loperamide (IMODIUM) 2 MG capsule Take 2 mg by mouth.    . Multiple Vitamin (MULTIVITAMIN) tablet Take 1 tablet by mouth daily.    . ondansetron (ZOFRAN ODT) 4 MG disintegrating tablet Take 1 tablet (4 mg total) by mouth every 8 (eight) hours as needed for nausea or vomiting. 20 tablet 0   No current facility-administered medications for this visit.      Review of Systems Full ROS  was asked and was negative except for the information on the HPI  Physical Exam Blood pressure 121/75, pulse 73, temperature (!) 97.1 F (36.2 C), temperature source Oral, height 5' 7.5" (1.715 m), weight 113.4 kg (250 lb). CONSTITUTIONAL: NAD obese female EYES: Pupils are equal, round, and reactive to light, Sclera are non-icteric. EARS, NOSE, MOUTH AND THROAT: The oropharynx is clear. The oral mucosa is pink and moist. Hearing is intact to voice. LYMPH NODES:  Lymph nodes in the neck are normal. RESPIRATORY:  Lungs are clear. There is normal respiratory effort, with equal breath sounds bilaterally, and without pathologic use of accessory muscles. CARDIOVASCULAR: Heart is regular without murmurs, gallops, or rubs. GI: The abdomen is soft, nontender, and nondistended.  Previous lower midline incision. Reducible ventral hernia periumbilical area There are no palpable masses. There is no hepatosplenomegaly. There are normal bowel sounds in all quadrants. GU: Rectal deferred.   MUSCULOSKELETAL: Normal muscle strength and tone. No cyanosis or edema.   SKIN: Turgor is good and there are no pathologic skin lesions or ulcers. NEUROLOGIC: Motor and sensation is grossly  normal. Cranial nerves are grossly intact. PSYCH:  Oriented to person, place and time. Affect is normal.  Data Reviewed  I have personally reviewed the patient's imaging, laboratory findings and medical records.    Assessment/Plan 61 yo female w symptomatic cholelithiasis as well as a small ventral hernia. Scars with the patient in detail about my recommendation for cholecystectomy and concomitant hernia repair. + or minus mesh placement depending on clinical condition. The risks, benefits, complications, treatment options, and expected outcomes were discussed with the patient. The possibilities of bleeding, recurrent infection, finding a normal gallbladder, perforation of viscus organs, damage to surrounding structures, bile leak, abscess formation, needing a drain placed, the need for additional procedures, reaction to medication, pulmonary aspiration,  failure to diagnose a condition, the possible need to convert to an open procedure, and creating a complication requiring transfusion or operation were discussed with the patient. The patient and/or family concurred with the proposed plan, giving informed consent.    Caroleen Hamman, MD FACS General Surgeon 07/27/2017, 12:28 PM

## 2017-07-28 ENCOUNTER — Telehealth: Payer: Self-pay | Admitting: Surgery

## 2017-07-28 ENCOUNTER — Encounter
Admission: RE | Admit: 2017-07-28 | Discharge: 2017-07-28 | Disposition: A | Discharge status: Home / Self Care | Payer: Federal, State, Local not specified - PPO | Source: Ambulatory Visit | Attending: Surgery | Admitting: Surgery

## 2017-07-28 HISTORY — DX: Anemia, unspecified: D64.9

## 2017-07-28 HISTORY — DX: Claustrophobia: F40.240

## 2017-07-28 HISTORY — DX: Family history of other specified conditions: Z84.89

## 2017-07-28 HISTORY — DX: Other specified diseases of liver: K76.89

## 2017-07-28 MED ORDER — CEFAZOLIN SODIUM-DEXTROSE 2-4 GM/100ML-% IV SOLN
2.0000 g | INTRAVENOUS | Status: AC
  Administered 2017-07-29: 2 g via INTRAVENOUS

## 2017-07-28 NOTE — Pre-Procedure Instructions (Signed)
EKG  ED ECG REPORT I, Rudene Re, the attending physician, personally viewed and interpreted this ECG.  Normal sinus rhythm, rate of 89, normal intervals, normal axis, no ST elevations or depressions, low voltage QRS.no significant changes when compared to prior from 2007 ____________________________________________  RADIOLOGY  RUQ [place]: cholelithiasis with no evidence of cholecystitis  ____________________________________________   PROCEDURES  Procedure(s) performed: None Procedures Critical Care performed:  None ____________________________________________   INITIAL IMPRESSION / ASSESSMENT AND PLAN / ED COURSE  61 y.o. female with a history of ulcerative colitis status post total colectomy who presents for evaluation of RUQ sharp constant abdominal pain since last night associated with nausea, chills, NBNB emesis and this morning having watery stools. patient is well-appearing, in no distress, has normal vital signs, abdomen shows tenderness to palpation in the right upper quadrant, negative Murphy sign, no rebound or guarding, positive bowel sounds with no clinical evidence of SBO. Differential diagnoses including food poisoning versus gastroenteritis versus gallbladder disease versus kidney stone. Labs show a slightly elevated white count of 12.2. Normal CMP and lipase.UA with no blood or infection. An EKG and troponin with no evidence of ischemia. We'll give IV fluids, Toradol, Zofran, and sent patient for right upper quadrant ultrasound.    _________________________ 3:15 PM on 07/15/2017 -----------------------------------------  ultrasound concerning for cholelithiasis with no evidence of cholecystitis. [name] pain has fully resolved after IV Toradol. She has no tenderness on exam. She is tolerating by mouth. We'll refer to Dr. Dahlia Byes at the request of the patient for outpatient management. Discussed return precautions for pain that returns or doesn't resolve  after 1-2 hours, significant nausea vomiting, or fever. Otherwise discussed the importance of avoiding fatty foods.   As part of my medical decision making, I reviewed the following data within the [facility] notes reviewed and incorporated, Labs reviewed , EKG interpreted , Old EKG reviewed, Old chart reviewed, Radiograph reviewed , Notes from prior ED visits and [place] Controlled Substance Database    Pertinent labs & imaging results that were available during my care of the patient were reviewed by me and considered in my medical decision making (see chart for details).    ____________________________________________   FINAL CLINICAL IMPRESSION(S) / ED DIAGNOSES  Final diagnoses:  RUQ abdominal pain  Calculus of gallbladder without cholecystitis without obstruction      NEW MEDICATIONS STARTED DURING THIS VISIT:      New Prescriptions   ONDANSETRON (ZOFRAN ODT) 4 MG DISINTEGRATING TABLET    Take 1 tablet (4 mg total) by mouth every 8 (eight) hours as needed for nausea or vomiting.     Note:  This document was prepared using Dragon voice recognition software and may include unintentional dictation errors.    Rudene Re, MD 07/15/17 [phone]           Electronically signed by Rudene Re, MD at 07/15/2017 3:16 PM     ED on 07/15/2017        Detailed Report

## 2017-07-28 NOTE — Patient Instructions (Signed)
  Your procedure is scheduled on: 07-29-17  Report to Same Day Surgery 2nd floor medical mall [facility] Entrance-take elevator on left to 2nd floor.  Check in with surgery information desk.) To find out your arrival time please call [phone] between 1PM - 3PM on 07-28-17  Remember: Instructions that are not followed completely may result in serious medical risk, up to and including death, or upon the discretion of your surgeon and anesthesiologist your surgery may need to be rescheduled.    _x___ 1. Do not eat food after midnight the night before your procedure. NO GUM CHEWING OR CANDY AFTER MIDNIGHT.  You may drink clear liquids up to 2 hours before you are scheduled to arrive at the hospital for your procedure.  Do not drink clear liquids within 2 hours of your scheduled arrival to the hospital.  Clear liquids include  --Water or Apple juice without pulp  --Clear carbohydrate beverage such as ClearFast or Gatorade  --Black Coffee or Clear Tea (No milk, no creamers, do not add anything to  the coffee or Tea      __x__ 2. No Alcohol for 24 hours before or after surgery.   __x__3. No Smoking for 24 prior to surgery.   ____  4. Bring all medications with you on the day of surgery if instructed.    __x__ 5. Notify your doctor if there is any change in your medical condition     (cold, fever, infections).     Do not wear jewelry, make-up, hairpins, clips or nail polish.  Do not wear lotions, powders, or perfumes. You may wear deodorant.  Do not shave 48 hours prior to surgery. Men may shave face and neck.  Do not bring valuables to the hospital.    [facility] is not responsible for any belongings or valuables.               Contacts, dentures or bridgework may not be worn into surgery.  Leave your suitcase in the car. After surgery it may be brought to your room.  For patients admitted to the hospital, discharge time is determined by your treatment team.   Patients discharged  the day of surgery will not be allowed to drive home.  You will need someone to drive you home and stay with you the night of your procedure.    Please read over the following fact sheets that you were given:   [facility] Preparing for Surgery and or MRSA Information   ____ Take anti-hypertensive listed below, cardiac, seizure, asthma, anti-reflux and psychiatric medicines. These include:  1. NONE  2.  3.  4.  5.  6.  ____Fleets enema or Magnesium Citrate as directed.   ____ Use CHG Soap or sage wipes as directed on instruction sheet   ____ Use inhalers on the day of surgery and bring to hospital day of surgery  ____ Stop Metformin and Janumet 2 days prior to surgery.    ____ Take 1/2 of usual insulin dose the night before surgery and none on the morning surgery.   ____ Follow recommendations from Cardiologist, Pulmonologist or PCP regarding stopping Aspirin, Coumadin, Plavix ,Eliquis, Effient, or Pradaxa, and Pletal.  X____Stop Anti-inflammatories such as Advil, Aleve, Ibuprofen, Motrin, Naproxen, Naprosyn, Goodies powders or aspirin products NOW- OK to take Tylenol    ____ Stop supplements until after surgery.     ____ Bring C-Pap to the hospital.

## 2017-07-28 NOTE — Telephone Encounter (Signed)
Pt advised of pre op date/time and sx date. Sx: 07/29/17 with Dr Pabon--laparoscopic cholecystectomy with ventral hernia repair.  Pre op: arrive 2 hours early -9:30am-- the day of surgery at Pre Admit testing in the [facility].   Patient advised to hold all medications, food and water after midnight.   Patient understands.

## 2017-07-29 ENCOUNTER — Ambulatory Visit: Payer: Federal, State, Local not specified - PPO | Admitting: Certified Registered"

## 2017-07-29 ENCOUNTER — Encounter
Admission: RE | Disposition: A | Discharge status: Home / Self Care | Payer: Self-pay | Source: Ambulatory Visit | Attending: Surgery

## 2017-07-29 ENCOUNTER — Ambulatory Visit
Admission: RE | Admit: 2017-07-29 | Discharge: 2017-07-29 | Disposition: A | Discharge status: Home / Self Care | Payer: Federal, State, Local not specified - PPO | Source: Ambulatory Visit | Attending: Surgery | Admitting: Surgery

## 2017-07-29 DIAGNOSIS — N800 Endometriosis of uterus: Secondary | ICD-10-CM | POA: Insufficient documentation

## 2017-07-29 DIAGNOSIS — Z87891 Personal history of nicotine dependence: Secondary | ICD-10-CM | POA: Insufficient documentation

## 2017-07-29 DIAGNOSIS — K439 Ventral hernia without obstruction or gangrene: Secondary | ICD-10-CM | POA: Diagnosis not present

## 2017-07-29 DIAGNOSIS — K8010 Calculus of gallbladder with chronic cholecystitis without obstruction: Secondary | ICD-10-CM | POA: Insufficient documentation

## 2017-07-29 DIAGNOSIS — Z6838 Body mass index (BMI) 38.0-38.9, adult: Secondary | ICD-10-CM | POA: Insufficient documentation

## 2017-07-29 DIAGNOSIS — Z79899 Other long term (current) drug therapy: Secondary | ICD-10-CM | POA: Insufficient documentation

## 2017-07-29 DIAGNOSIS — K5190 Ulcerative colitis, unspecified, without complications: Secondary | ICD-10-CM | POA: Diagnosis not present

## 2017-07-29 DIAGNOSIS — K432 Incisional hernia without obstruction or gangrene: Secondary | ICD-10-CM | POA: Diagnosis not present

## 2017-07-29 DIAGNOSIS — Z9049 Acquired absence of other specified parts of digestive tract: Secondary | ICD-10-CM | POA: Insufficient documentation

## 2017-07-29 DIAGNOSIS — K8000 Calculus of gallbladder with acute cholecystitis without obstruction: Secondary | ICD-10-CM | POA: Diagnosis not present

## 2017-07-29 HISTORY — PX: VENTRAL HERNIA REPAIR: SHX424

## 2017-07-29 HISTORY — PX: CHOLECYSTECTOMY: SHX55

## 2017-07-29 SURGERY — LAPAROSCOPIC CHOLECYSTECTOMY
Anesthesia: General | Wound class: Clean Contaminated

## 2017-07-29 MED ORDER — ROCURONIUM BROMIDE 50 MG/5ML IV SOLN
INTRAVENOUS | Status: AC
  Filled 2017-07-29: qty 1

## 2017-07-29 MED ORDER — CHLORHEXIDINE GLUCONATE CLOTH 2 % EX PADS: 6.0000 | MEDICATED_PAD | Freq: Once | CUTANEOUS | Status: DC

## 2017-07-29 MED ORDER — ONDANSETRON HCL 4 MG/2ML IJ SOLN
INTRAMUSCULAR | Status: AC
  Filled 2017-07-29: qty 2

## 2017-07-29 MED ORDER — BUPIVACAINE-EPINEPHRINE (PF) 0.25% -1:200000 IJ SOLN
INTRAMUSCULAR | Status: AC
  Filled 2017-07-29: qty 30

## 2017-07-29 MED ORDER — LIDOCAINE HCL (CARDIAC) 20 MG/ML IV SOLN
INTRAVENOUS | Status: DC | PRN
  Administered 2017-07-29: 100 mg via INTRAVENOUS

## 2017-07-29 MED ORDER — MIDAZOLAM HCL 2 MG/2ML IJ SOLN
INTRAMUSCULAR | Status: AC
  Filled 2017-07-29: qty 2

## 2017-07-29 MED ORDER — FENTANYL CITRATE (PF) 100 MCG/2ML IJ SOLN: 25.0000 ug | INTRAMUSCULAR | Status: DC | PRN

## 2017-07-29 MED ORDER — PROMETHAZINE HCL 25 MG/ML IJ SOLN: 6.2500 mg | INTRAMUSCULAR | Status: DC | PRN

## 2017-07-29 MED ORDER — HYDROMORPHONE HCL 1 MG/ML IJ SOLN
INTRAMUSCULAR | Status: DC | PRN
  Administered 2017-07-29: 0.5 mg via INTRAVENOUS

## 2017-07-29 MED ORDER — ROCURONIUM BROMIDE 100 MG/10ML IV SOLN
INTRAVENOUS | Status: DC | PRN
  Administered 2017-07-29: 40 mg via INTRAVENOUS
  Administered 2017-07-29: 10 mg via INTRAVENOUS

## 2017-07-29 MED ORDER — DEXAMETHASONE SODIUM PHOSPHATE 10 MG/ML IJ SOLN
INTRAMUSCULAR | Status: AC
  Filled 2017-07-29: qty 1

## 2017-07-29 MED ORDER — PROPOFOL 10 MG/ML IV BOLUS
INTRAVENOUS | Status: AC
  Filled 2017-07-29: qty 20

## 2017-07-29 MED ORDER — SUCCINYLCHOLINE CHLORIDE 20 MG/ML IJ SOLN
INTRAMUSCULAR | Status: DC | PRN
  Administered 2017-07-29: 100 mg via INTRAVENOUS

## 2017-07-29 MED ORDER — ONDANSETRON HCL 4 MG/2ML IJ SOLN
INTRAMUSCULAR | Status: DC | PRN
  Administered 2017-07-29: 4 mg via INTRAVENOUS

## 2017-07-29 MED ORDER — HYDROMORPHONE HCL 1 MG/ML IJ SOLN
INTRAMUSCULAR | Status: AC
  Filled 2017-07-29: qty 1

## 2017-07-29 MED ORDER — SUGAMMADEX SODIUM 500 MG/5ML IV SOLN
INTRAVENOUS | Status: AC
  Filled 2017-07-29: qty 5

## 2017-07-29 MED ORDER — FENTANYL CITRATE (PF) 100 MCG/2ML IJ SOLN
INTRAMUSCULAR | Status: DC | PRN
  Administered 2017-07-29 (×3): 50 ug via INTRAVENOUS
  Administered 2017-07-29: 100 ug via INTRAVENOUS

## 2017-07-29 MED ORDER — ACETAMINOPHEN 10 MG/ML IV SOLN
INTRAVENOUS | Status: DC | PRN
  Administered 2017-07-29: 1000 mg via INTRAVENOUS

## 2017-07-29 MED ORDER — LIDOCAINE HCL (PF) 2 % IJ SOLN
INTRAMUSCULAR | Status: AC
  Filled 2017-07-29: qty 10

## 2017-07-29 MED ORDER — BUPIVACAINE-EPINEPHRINE (PF) 0.25% -1:200000 IJ SOLN
INTRAMUSCULAR | Status: DC | PRN
  Administered 2017-07-29: 30 mL via PERINEURAL

## 2017-07-29 MED ORDER — MIDAZOLAM HCL 2 MG/2ML IJ SOLN
INTRAMUSCULAR | Status: DC | PRN
  Administered 2017-07-29: 2 mg via INTRAVENOUS

## 2017-07-29 MED ORDER — SUGAMMADEX SODIUM 500 MG/5ML IV SOLN
INTRAVENOUS | Status: DC | PRN
  Administered 2017-07-29: 230 mg via INTRAVENOUS

## 2017-07-29 MED ORDER — ACETAMINOPHEN 10 MG/ML IV SOLN
INTRAVENOUS | Status: AC
  Filled 2017-07-29: qty 100

## 2017-07-29 MED ORDER — FAMOTIDINE 20 MG PO TABS
20.0000 mg | ORAL_TABLET | Freq: Once | ORAL | Status: AC
  Administered 2017-07-29: 20 mg via ORAL

## 2017-07-29 MED ORDER — HYDROCODONE-ACETAMINOPHEN 5-325 MG PO TABS: 1.0000 | ORAL_TABLET | Freq: Four times a day (QID) | ORAL | 0 refills | Status: DC | PRN

## 2017-07-29 MED ORDER — LACTATED RINGERS IV SOLN
INTRAVENOUS | Status: DC
  Administered 2017-07-29: 11:00:00 via INTRAVENOUS

## 2017-07-29 MED ORDER — DEXAMETHASONE SODIUM PHOSPHATE 10 MG/ML IJ SOLN
INTRAMUSCULAR | Status: DC | PRN
  Administered 2017-07-29: 4 mg via INTRAVENOUS

## 2017-07-29 MED ORDER — FENTANYL CITRATE (PF) 250 MCG/5ML IJ SOLN
INTRAMUSCULAR | Status: AC
  Filled 2017-07-29: qty 5

## 2017-07-29 MED ORDER — FAMOTIDINE 20 MG PO TABS
ORAL_TABLET | ORAL | Status: AC
  Administered 2017-07-29: 20 mg via ORAL
  Filled 2017-07-29: qty 1

## 2017-07-29 MED ORDER — PROPOFOL 10 MG/ML IV BOLUS
INTRAVENOUS | Status: DC | PRN
  Administered 2017-07-29: 200 mg via INTRAVENOUS

## 2017-07-29 MED ORDER — KETOROLAC TROMETHAMINE 30 MG/ML IJ SOLN
INTRAMUSCULAR | Status: DC | PRN
  Administered 2017-07-29: 30 mg via INTRAVENOUS

## 2017-07-29 MED ORDER — CEFAZOLIN SODIUM-DEXTROSE 2-4 GM/100ML-% IV SOLN
INTRAVENOUS | Status: AC
  Filled 2017-07-29: qty 100

## 2017-07-29 SURGICAL SUPPLY — 56 items
APPLICATOR COTTON TIP 6IN STRL (MISCELLANEOUS) IMPLANT
APPLIER CLIP 5 13 M/L LIGAMAX5 (MISCELLANEOUS) ×3
BLADE SURG 15 STRL LF DISP TIS (BLADE) ×1 IMPLANT
BLADE SURG 15 STRL SS (BLADE) ×2
CANISTER SUCT 1200ML W/VALVE (MISCELLANEOUS) ×3 IMPLANT
CHLORAPREP W/TINT 26ML (MISCELLANEOUS) ×3 IMPLANT
CHOLANGIOGRAM CATH TAUT (CATHETERS) IMPLANT
CLEANER CAUTERY TIP 5X5 PAD (MISCELLANEOUS) ×1 IMPLANT
CLIP APPLIE 5 13 M/L LIGAMAX5 (MISCELLANEOUS) ×1 IMPLANT
DECANTER SPIKE VIAL GLASS SM (MISCELLANEOUS) IMPLANT
DERMABOND ADVANCED (GAUZE/BANDAGES/DRESSINGS) ×2
DERMABOND ADVANCED .7 DNX12 (GAUZE/BANDAGES/DRESSINGS) ×1 IMPLANT
DEVICE SECURE STRAP 25 ABSORB (INSTRUMENTS) IMPLANT
DRAPE C-ARM XRAY 36X54 (DRAPES) IMPLANT
DRAPE INCISE IOBAN 66X45 STRL (DRAPES) ×3 IMPLANT
ELECT CAUTERY BLADE 6.4 (BLADE) ×3 IMPLANT
ELECT REM PT RETURN 9FT ADLT (ELECTROSURGICAL) ×3
ELECTRODE REM PT RTRN 9FT ADLT (ELECTROSURGICAL) ×1 IMPLANT
GLOVE BIO SURGEON STRL SZ7 (GLOVE) ×18 IMPLANT
GOWN STRL REUS W/ TWL LRG LVL3 (GOWN DISPOSABLE) ×3 IMPLANT
GOWN STRL REUS W/TWL LRG LVL3 (GOWN DISPOSABLE) ×6
GRASPER SUT TROCAR 14GX15 (MISCELLANEOUS) ×3 IMPLANT
HANDLE YANKAUER SUCT BULB TIP (MISCELLANEOUS) ×3 IMPLANT
IRRIGATION STRYKERFLOW (MISCELLANEOUS) ×1 IMPLANT
IRRIGATOR STRYKERFLOW (MISCELLANEOUS) ×3
IV CATH ANGIO 12GX3 LT BLUE (NEEDLE) IMPLANT
IV NS 1000ML (IV SOLUTION) ×2
IV NS 1000ML BAXH (IV SOLUTION) ×1 IMPLANT
L-HOOK LAP DISP 36CM (ELECTROSURGICAL) ×3
LHOOK LAP DISP 36CM (ELECTROSURGICAL) ×1 IMPLANT
NDL INSUFF ACCESS 14 VERSASTEP (NEEDLE) ×3 IMPLANT
NEEDLE HYPO 22GX1.5 SAFETY (NEEDLE) ×3 IMPLANT
NS IRRIG 500ML POUR BTL (IV SOLUTION) ×3 IMPLANT
PACK LAP CHOLECYSTECTOMY (MISCELLANEOUS) ×3 IMPLANT
PAD CLEANER CAUTERY TIP 5X5 (MISCELLANEOUS) ×2
PENCIL ELECTRO HAND CTR (MISCELLANEOUS) ×3 IMPLANT
POUCH SPECIMEN RETRIEVAL 10MM (ENDOMECHANICALS) ×3 IMPLANT
SCISSORS METZENBAUM CVD 33 (INSTRUMENTS) IMPLANT
SHEARS HARMONIC ACE PLUS 36CM (ENDOMECHANICALS) IMPLANT
SLEEVE ENDOPATH XCEL 5M (ENDOMECHANICALS) ×6 IMPLANT
SOL ANTI-FOG 6CC FOG-OUT (MISCELLANEOUS) ×1 IMPLANT
SOL FOG-OUT ANTI-FOG 6CC (MISCELLANEOUS) ×2
SPONGE LAP 18X18 5 PK (GAUZE/BANDAGES/DRESSINGS) ×3 IMPLANT
STOPCOCK 4 WAY LG BORE MALE ST (IV SETS) IMPLANT
SUT ETHIBOND 0 MO6 C/R (SUTURE) ×3 IMPLANT
SUT MNCRL AB 4-0 PS2 18 (SUTURE) ×3 IMPLANT
SUT VIC AB 0 SH 27 (SUTURE) ×3 IMPLANT
SUT VIC AB 2-0 SH 27 (SUTURE) ×2
SUT VIC AB 2-0 SH 27XBRD (SUTURE) ×1 IMPLANT
SUT VICRYL 0 AB UR-6 (SUTURE) ×6 IMPLANT
SYR 20CC LL (SYRINGE) ×3 IMPLANT
TROCAR XCEL BLUNT TIP 100MML (ENDOMECHANICALS) ×3 IMPLANT
TROCAR XCEL NON-BLD 11X100MML (ENDOMECHANICALS) ×3 IMPLANT
TROCAR XCEL NON-BLD 5MMX100MML (ENDOMECHANICALS) ×6 IMPLANT
TUBING INSUFFLATOR HI FLOW (MISCELLANEOUS) ×3 IMPLANT
WATER STERILE IRR 1000ML POUR (IV SOLUTION) IMPLANT

## 2017-07-29 NOTE — Anesthesia Postprocedure Evaluation (Signed)
Anesthesia Post Note  Patient: ZANYLA KLEBBA  Procedure(s) Performed: LAPAROSCOPIC CHOLECYSTECTOMY (N/A ) LAPAROSCOPIC VENTRAL HERNIA (N/A )  Patient location during evaluation: PACU Anesthesia Type: General Level of consciousness: awake and alert Pain management: pain level controlled Vital Signs Assessment: post-procedure vital signs reviewed and stable Respiratory status: spontaneous breathing, nonlabored ventilation, respiratory function stable and patient connected to nasal cannula oxygen Cardiovascular status: blood pressure returned to baseline and stable Postop Assessment: no apparent nausea or vomiting Anesthetic complications: no     Last Vitals:  Vitals:   07/29/17 1510 07/29/17 1528  BP: (!) 157/85   Pulse: 87 (P) 77  Resp: (!) 34 (P) 18  Temp: 37 C (P) 36.7 C  SpO2: 96% (P) 97%    Last Pain:  Vitals:   07/29/17 1528  TempSrc: (P) Temporal  PainSc:                  Martha Clan

## 2017-07-29 NOTE — Transfer of Care (Signed)
Immediate Anesthesia Transfer of Care Note  Patient: Haley Deleon  Procedure(s) Performed: LAPAROSCOPIC CHOLECYSTECTOMY (N/A ) LAPAROSCOPIC VENTRAL HERNIA (N/A )  Patient Location: PACU  Anesthesia Type:General  Level of Consciousness: awake and responds to stimulation  Airway & Oxygen Therapy: Patient Spontanous Breathing and Patient connected to nasal cannula oxygen  Post-op Assessment: Report given to RN and Post -op Vital signs reviewed and stable  Post vital signs: Reviewed and stable  Last Vitals:  Vitals:   07/29/17 1046 07/29/17 1425  BP: (!) 132/92 (!) 176/83  Pulse: 97 92  Resp: 18 (!) 21  Temp: 36.7 C   SpO2: 99% 100%    Last Pain:  Vitals:   07/29/17 1046  TempSrc: Oral      Patients Stated Pain Goal: 2 (48/62/82 4175)  Complications: No apparent anesthesia complications

## 2017-07-29 NOTE — Anesthesia Post-op Follow-up Note (Signed)
Anesthesia QCDR form completed.        

## 2017-07-29 NOTE — Anesthesia Preprocedure Evaluation (Signed)
Anesthesia Evaluation  Patient identified by MRN, date of birth, ID band Patient awake    Reviewed: Allergy & Precautions, H&P , NPO status , [name] Chart, lab work & pertinent test results, reviewed documented beta blocker date and time   History of Anesthesia Complications (+) PONV and history of anesthetic complications  Airway Mallampati: III  TM Distance: >3 FB Neck ROM: full    Dental  (+) Caps, Dental Advidsory Given, Teeth Intact   Pulmonary neg pulmonary ROS, former smoker,           Cardiovascular Exercise Tolerance: Good negative cardio ROS       Neuro/Psych negative neurological ROS  negative psych ROS   GI/Hepatic Neg liver ROS, PUD, neg GERD  ,  Endo/Other  neg diabetesMorbid obesity  Renal/GU negative Renal ROS  negative genitourinary   Musculoskeletal   Abdominal   Peds  Hematology  (+) Blood dyscrasia, anemia ,   Anesthesia Other Findings Past Medical History: No date: Anemia No date: Claustrophobia     Comment:  NO MEDS No date: Family history of adverse reaction to anesthesia     Comment:  1ST COUSIN-WAKE UP DURING SURGERY No date: Liver cyst   Reproductive/Obstetrics negative OB ROS                             Anesthesia Physical Anesthesia Plan  ASA: II  Anesthesia Plan: General   Post-op Pain Management:    Induction: Intravenous  PONV Risk Score and Plan: 3 and Ondansetron and Dexamethasone  Airway Management Planned: Oral ETT  Additional Equipment:   Intra-op Plan:   Post-operative Plan: Extubation in OR  Informed Consent: I have reviewed the patients History and Physical, chart, labs and discussed the procedure including the risks, benefits and alternatives for the proposed anesthesia with the patient or authorized representative who has indicated his/her understanding and acceptance.   Dental Advisory Given  Plan Discussed with:  Anesthesiologist, CRNA and Surgeon  Anesthesia Plan Comments:         Anesthesia Quick Evaluation

## 2017-07-29 NOTE — Anesthesia Procedure Notes (Signed)
Procedure Name: Intubation Date/Time: 07/29/2017 12:29 PM Performed by: Johnna Acosta, CRNA Pre-anesthesia Checklist: Patient identified, Emergency Drugs available, Suction available, Patient being monitored and Timeout performed Patient Re-evaluated:Patient Re-evaluated prior to induction Oxygen Delivery Method: Circle system utilized Preoxygenation: Pre-oxygenation with 100% oxygen Induction Type: IV induction Ventilation: Mask ventilation without difficulty and Oral airway inserted - appropriate to patient size Laryngoscope Size: Sabra Heck and 2 Grade View: Grade III Tube type: Oral Tube size: 7.0 mm Number of attempts: 1 Airway Equipment and Method: Stylet Placement Confirmation: ETT inserted through vocal cords under direct vision,  positive ETCO2 and breath sounds checked- equal and bilateral Secured at: 22 cm Tube secured with: Tape Dental Injury: Teeth and Oropharynx as per pre-operative assessment  Difficulty Due To: Difficulty was anticipated, Difficult Airway- due to anterior larynx, Difficult Airway- due to limited oral opening and Difficult Airway- due to large tongue Future Recommendations: Recommend- induction with short-acting agent, and alternative techniques readily available

## 2017-07-29 NOTE — Op Note (Signed)
Laparoscopic Cholecystectomy w Ventral hernia repair  Pre-operative Diagnosis: Cholelithiasis and ventral hernia  Post-operative Diagnosis: Same  Procedure:  Laparoscopic cholecystectomy Ventral hernia repair Primary  Surgeon: Caroleen Hamman, MD FACS  Anesthesia: Gen. with endotracheal tube  Findings: Chronic Cholecystitis Ventral hernia defect measuring 3.5 cms, I chose not to place mesh given multiple risks factors.  Extensive adhesion from the SB to the abdominal wall, liver to the abd wall and sb to the hernia sac.  Estimated Blood Loss: 25cc         Specimens: Gallbladder           Complications: none   Procedure Details  The patient was seen again in the Holding Room. The benefits, complications, treatment options, and expected outcomes were discussed with the patient. The risks of bleeding, infection, recurrence of symptoms, failure to resolve symptoms, bile duct damage, bile duct leak, retained common bile duct stone, bowel injury, any of which could require further surgery and/or ERCP, stent, or papillotomy were reviewed with the patient. The likelihood of improving the patient's symptoms with return to their baseline status is good.  The patient and/or family concurred with the proposed plan, giving informed consent.  The patient was taken to Operating Room, identified as Haley Deleon and the procedure verified as Laparoscopic Cholecystectomy.  A Time Out was held and the above information confirmed.  Prior to the induction of general anesthesia, antibiotic prophylaxis was administered. VTE prophylaxis was in place. General endotracheal anesthesia was then administered and tolerated well. After the induction, the abdomen was prepped with Chloraprep and draped in the sterile fashion. The patient was positioned in the supine position.  Supraumbilical incision created and hernia sac identified. We carefully dissected the sac from adjacent tissues w cautery. Once the sac and the  fascial edges were identified., we incised the sac encountering a loop of SB. We performed open LOA w Metz scissors until we free up the bowel from the abdominal wall. Hernia sac was removed. Under direct vision we entered the abdominal cavity and a Hasson trochar was placed after two vicryl stitches were anchored to the fascia. Pneumoperitoneum was then created with CO2 and tolerated well without any adverse changes in the patient's vital signs.  Three 5-mm ports were placed in the right upper quadrant all under direct vision. All skin incisions  were infiltrated with a local anesthetic agent before making the incision and placing the trocars.  Please note that we initially placed an epigastric port and performed lap LOA to create a window within the RUQ for proper dissection. Once the RUQ was freed all the ports were placed.  The patient was positioned  in reverse Trendelenburg, tilted slightly to the patient's left.  The gallbladder was identified, the fundus grasped and retracted cephalad. Adhesions were lysed bluntly. The infundibulum was grasped and retracted laterally, exposing the peritoneum overlying the triangle of Calot. This was then divided and exposed in a blunt fashion. An extended critical view of the cystic duct and cystic artery was obtained.  The cystic duct was clearly identified and bluntly dissected.   Artery and duct were double clipped and divided.  The gallbladder was taken from the gallbladder fossa in a retrograde fashion with the electrocautery. The gallbladder was removed and placed in an Endocatch bag. The liver bed was irrigated and inspected. Hemostasis was achieved with the electrocautery. Copious irrigation was utilized and was repeatedly aspirated until clear.  The gallbladder and Endocatch sac were then removed through a port site.  Inspection of the right upper quadrant was performed. No bleeding, bile duct injury or leak, or bowel injury was noted. Pneumoperitoneum was  released.     The Ventral hernia defect was closed with interrupted ethibond sutures. I performed an umbilicoplasty and excised the umbilicus given extensive dissection and likelyhood of wound issues post operatively.  2-0 vicryl used to close subq tissue. 4-0 subcuticular Monocryl was used to close the skin. Dermabond was  applied.  The patient was then extubated and brought to the recovery room in stable condition. Sponge, lap, and needle counts were correct at closure and at the conclusion of the case.               Caroleen Hamman, MD, FACS

## 2017-07-29 NOTE — Interval H&P Note (Signed)
History and Physical Interval Note:  07/29/2017 12:09 PM  Haley Deleon  has presented today for surgery, with the diagnosis of calculus of gallbladder  The various methods of treatment have been discussed with the patient and family. After consideration of risks, benefits and other options for treatment, the patient has consented to  Procedure(s): LAPAROSCOPIC CHOLECYSTECTOMY (N/A) LAPAROSCOPIC VENTRAL HERNIA (N/A) as a surgical intervention .  The patient's history has been reviewed, patient examined, no change in status, stable for surgery.  I have reviewed the patient's chart and labs.  Questions were answered to the patient's satisfaction.     [place]

## 2017-07-29 NOTE — Discharge Instructions (Addendum)

## 2017-07-30 ENCOUNTER — Telehealth: Payer: Self-pay | Admitting: Surgery

## 2017-07-30 ENCOUNTER — Encounter: Payer: Self-pay | Admitting: Surgery

## 2017-07-30 NOTE — Telephone Encounter (Signed)
Patient was just calling to let Dr. Dahlia Byes know that she is doing great, and said everything is very well, the only thing that was bothering her was her eyes, but thinks it may of just been by the tape.

## 2017-07-30 NOTE — Telephone Encounter (Signed)
Called patient and left a detailed message letting her know that we are glad to hear that she is doing well. I also told her that if she kept having problems with her eyes, then to contact her primary care doctor or [place]. However, this could be due to the tape that was placed.

## 2017-08-02 LAB — SURGICAL PATHOLOGY

## 2017-08-18 ENCOUNTER — Ambulatory Visit (INDEPENDENT_AMBULATORY_CARE_PROVIDER_SITE_OTHER): Payer: Federal, State, Local not specified - PPO | Admitting: Surgery

## 2017-08-18 ENCOUNTER — Encounter: Payer: Self-pay | Admitting: Surgery

## 2017-08-18 VITALS — BP 116/80 | HR 72 | Temp 98.0°F | Wt 245.0 lb

## 2017-08-18 DIAGNOSIS — Z09 Encounter for follow-up examination after completed treatment for conditions other than malignant neoplasm: Secondary | ICD-10-CM

## 2017-08-18 NOTE — Progress Notes (Signed)
S/p lap chole and ventral H repair Doing great No pain No N/V Taking Po Path d/w pt  PE NAD Abd: soft, incisions c/d/i. No infection No recurrence  A/P Doing well RTC prn

## 2017-08-18 NOTE — Patient Instructions (Signed)

## 2017-09-17 ENCOUNTER — Emergency Department
Admission: EM | Admit: 2017-09-17 | Discharge: 2017-09-17 | Disposition: A | Discharge status: Home / Self Care | Payer: Federal, State, Local not specified - PPO | Attending: Emergency Medicine | Admitting: Emergency Medicine

## 2017-09-17 ENCOUNTER — Encounter: Payer: Self-pay | Admitting: Emergency Medicine

## 2017-09-17 ENCOUNTER — Emergency Department: Payer: Federal, State, Local not specified - PPO

## 2017-09-17 DIAGNOSIS — R0789 Other chest pain: Secondary | ICD-10-CM | POA: Diagnosis not present

## 2017-09-17 DIAGNOSIS — Z87891 Personal history of nicotine dependence: Secondary | ICD-10-CM | POA: Insufficient documentation

## 2017-09-17 DIAGNOSIS — R42 Dizziness and giddiness: Secondary | ICD-10-CM | POA: Diagnosis not present

## 2017-09-17 LAB — TROPONIN I

## 2017-09-17 LAB — BASIC METABOLIC PANEL
Anion gap: 7 (ref 5–15)
BUN: 13 mg/dL (ref 6–20)
CALCIUM: 9 mg/dL (ref 8.9–10.3)
CHLORIDE: 106 mmol/L (ref 101–111)
CO2: 26 mmol/L (ref 22–32)
CREATININE: 0.8 mg/dL (ref 0.44–1.00)
GFR calc non Af Amer: >60 mL/min (ref >60)
Glucose, Bld: 140 mg/dL — ABNORMAL HIGH (ref 65–99)
Potassium: 4.1 mmol/L (ref 3.5–5.1)
SODIUM: 139 mmol/L (ref 135–145)

## 2017-09-17 LAB — CBC
HCT: 45.1 % (ref 35.0–47.0)
Hemoglobin: 14.5 g/dL (ref 12.0–16.0)
MCH: 28.4 pg (ref 26.0–34.0)
MCHC: 32.2 g/dL (ref 32.0–36.0)
MCV: 88.2 fL (ref 80.0–100.0)
PLATELETS: 251 10*3/uL (ref 150–440)
RBC: 5.11 MIL/uL (ref 3.80–5.20)
RDW: 14.4 % (ref 11.5–14.5)
WBC: 7.4 10*3/uL (ref 3.6–11.0)

## 2017-09-17 MED ORDER — MECLIZINE HCL 25 MG PO TABS: 25.0000 mg | ORAL_TABLET | Freq: Three times a day (TID) | ORAL | 1 refills | Status: DC | PRN

## 2017-09-17 NOTE — ED Provider Notes (Signed)
[facility] Emergency Department Provider Note       Time seen: ----------------------------------------- 3:31 PM on 09/17/2017 -----------------------------------------   I have reviewed the triage vital signs and the nursing notes.  HISTORY   Chief Complaint Dizziness and Chest Pain    HPI Haley Deleon is a 61 y.o. female with a history of anemia, claustrophobia, peripheral vertigo who presents to the ED for dizziness and chest discomfort that started this morning.  Patient states the room has been spinning.  Subsequently after this started she began having some chest pain.  She is not sure if she became nervous or anxious after the dizziness started.  She presents to the ER for further evaluation.  Symptoms have resolved at this time.  She does have a history of vertigo for which she has had the Epley maneuver performed.  Past Medical History:  Diagnosis Date  . Anemia   . Claustrophobia    NO MEDS  . Family history of adverse reaction to anesthesia    1ST COUSIN-WAKE UP DURING SURGERY  . Liver cyst     Patient Active Problem List   Diagnosis Date Noted  . Gallstones and inflammation of gallbladder without obstruction   . Incisional hernia, without obstruction or gangrene   . History of biliary T-tube placement 02/24/2014  . Cellulitis 02/24/2014  . Cellulitis and abscess 02/24/2014  . Ulcerative colitis ([place]) 08/22/2013    Past Surgical History:  Procedure Laterality Date  . BREAST BIOPSY Right 11/02/2011   ultrasound guided biopsy/clip-benign  . CHOLECYSTECTOMY N/A 07/29/2017   Procedure: LAPAROSCOPIC CHOLECYSTECTOMY;  Surgeon: Jules Husbands, MD;  Location: [facility] ORS;  Service: General;  Laterality: N/A;  . COLON SURGERY  2014, 2015   DUE TO ULERATIVE COLITIS WITH COLOSTOMY  . COLOSTOMY TAKEDOWN    . FOOT SURGERY Bilateral    BONE SPURS   . MANDIBLE FRACTURE SURGERY  1991  . MOUTH SURGERY    . VENTRAL HERNIA REPAIR N/A 07/29/2017   Procedure: LAPAROSCOPIC VENTRAL HERNIA;  Surgeon: Jules Husbands, MD;  Location: [facility] ORS;  Service: General;  Laterality: N/A;    Allergies Caffeine; Chocolate; and Cocoa  Social History Social History   Tobacco Use  . Smoking status: Former Smoker    Packs/day: 1.00    Years: 20.00    Pack years: 20.00    Types: Cigarettes    Last attempt to quit: 07/28/2005    Years since quitting: 12.1  . Smokeless tobacco: Never Used  Substance Use Topics  . Alcohol use: Yes    Comment: RARE WINE  . Drug use: No    Review of Systems Constitutional: Negative for fever. Cardiovascular: Positive for chest pain Respiratory: Negative for shortness of breath. Gastrointestinal: Negative for abdominal pain, vomiting and diarrhea. Genitourinary: Negative for dysuria. Musculoskeletal: Negative for back pain. Skin: Negative for rash. Neurological: Negative for headaches, focal weakness or numbness.  Positive for dizziness  All systems negative/normal/unremarkable except as stated in the HPI  ____________________________________________   PHYSICAL EXAM:  VITAL SIGNS: ED Triage Vitals  Enc Vitals Group     BP 09/17/17 1228 133/73     Pulse Rate 09/17/17 1228 89     Resp 09/17/17 1228 18     Temp 09/17/17 1228 98.2 F (36.8 C)     Temp Source 09/17/17 1228 Oral     SpO2 09/17/17 1228 100 %     Weight 09/17/17 1228 254 lb (115.2 kg)     Height 09/17/17 1228  5' 7.5" (1.715 m)     Head Circumference --      Peak Flow --      Pain Score 09/17/17 1240 1     Pain Loc --      Pain Edu? --      Excl. in [place]? --     Constitutional: Alert and oriented. Well appearing and in no distress. Eyes: Conjunctivae are normal. Normal extraocular movements. ENT   Head: Normocephalic and atraumatic.   Nose: No congestion/rhinnorhea.   Mouth/Throat: Mucous membranes are moist.   Neck: No stridor. Cardiovascular: Normal rate, regular rhythm. No murmurs, rubs, or gallops. Respiratory:  Normal respiratory effort without tachypnea nor retractions. Breath sounds are clear and equal bilaterally. No wheezes/rales/rhonchi. Gastrointestinal: Soft and nontender. Normal bowel sounds Musculoskeletal: Nontender with normal range of motion in extremities. No lower extremity tenderness nor edema. Neurologic:  Normal speech and language. No gross focal neurologic deficits are appreciated.  Skin:  Skin is warm, dry and intact. No rash noted. Psychiatric: Mood and affect are normal. Speech and behavior are normal.  ____________________________________________  EKG: Interpreted by me.  Sinus rhythm the rate of 100 bpm, normal PR interval, normal QRS, normal QT.  ____________________________________________  ED COURSE:  As part of my medical decision making, I reviewed the following data within the [place] History obtained from family if available, nursing notes, old chart and ekg, as well as notes from prior ED visits. Patient presented for dizziness and chest pain, we will assess with labs and imaging as indicated at this time.   Procedures ____________________________________________   LABS (pertinent positives/negatives)  Labs Reviewed  BASIC METABOLIC PANEL - Abnormal; Notable for the following components:      Result Value   Glucose, Bld 140 (*)    All other components within normal limits  CBC  TROPONIN I    RADIOLOGY Images were viewed by me  Chest x-ray reveals no acute process  ____________________________________________  DIFFERENTIAL DIAGNOSIS   Vertigo, anxiety, unstable angina, PE, pneumothorax, electrolyte abnormality, dehydration  FINAL ASSESSMENT AND PLAN  Vertigo, chest pain   Plan: Patient had presented for vertigo and chest pain. [name] labs do not reveal any acute process. [name] imaging was unremarkable.  She will be referred to cardiology for outpatient follow-up.  It appears she had vertigo which likely triggered  anxiety due to her mother's death within the past 2 years.  She was concerned she may be having a heart attack.  She is cleared for outpatient follow-up.   Earleen Newport, MD   Note: This note was generated in part or whole with voice recognition software. Voice recognition is usually quite accurate but there are transcription errors that can and very often do occur. I apologize for any typographical errors that were not detected and corrected.     Earleen Newport, MD 09/17/17 [phone]

## 2017-09-17 NOTE — ED Triage Notes (Signed)
Pt comes into the ED via POV c/o dizziness and chest discomfort that started this morning.  Patient states the room has been spinning.  Patient states she has a h/o crystals in her ear that can cause vertigo.  Patient has even and unlabored respirations and is neurologically intact at this time.  Patient ambulatory to triage and in NAD. Denies any N/V or shortness of breath.

## 2017-09-17 NOTE — ED Triage Notes (Addendum)
FIRST NURSE NOTE-C/O CP and dizziness.  Ambulatory to check in without difficulty. Declined wheel chair. Pulled next for EKG

## 2017-09-17 NOTE — ED Notes (Signed)
Pt reports dizziness since 9am this morning. Pt states that this has happened in the past and it has been the crystals in her ear. Denies headache or aura.

## 2017-09-17 NOTE — ED Notes (Signed)
Pt ambulatory upon discharge. Verbalized understanding of discharge instructions, follow-up care and prescriptions. VSS. Skin warm and dry. A&O x4.

## 2017-09-22 ENCOUNTER — Telehealth: Payer: Self-pay

## 2017-09-22 NOTE — Telephone Encounter (Signed)
Lmov for patient to call and schedule ED fu appointment They were seen on 09/17/17   Will try again at a later time

## 2017-10-01 NOTE — Telephone Encounter (Signed)
Pt is scheduled 11/17/16 to see Dr End   Nothing else needed

## 2017-11-14 IMAGING — US US ABDOMEN LIMITED
1 series · 14 of 25 positions shown · non-contrast
Comparison: CT 02/27/2014 .

CLINICAL DATA: Right upper quadrant pain.

EXAM:
ULTRASOUND ABDOMEN LIMITED RIGHT UPPER QUADRANT

[Series 1: us abdomen limited · 0.22mm/px · 14 of 54 slices shown]
[im 1/54]
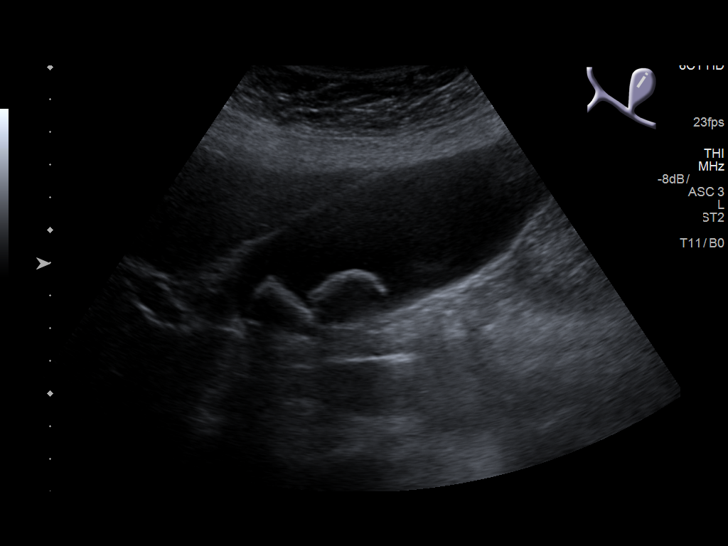
[im 5/54]
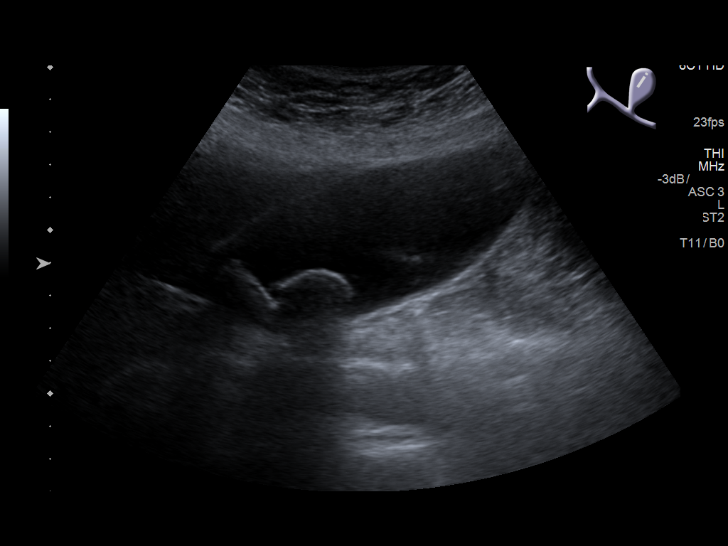
[im 9/54]
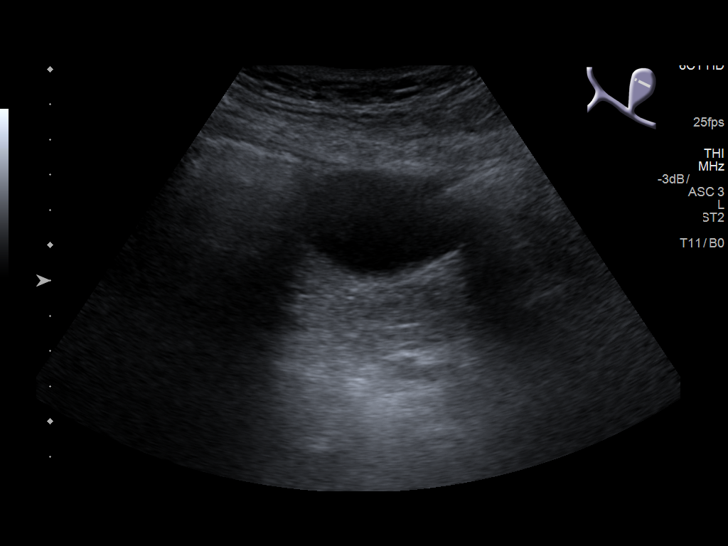
[im 14/54]
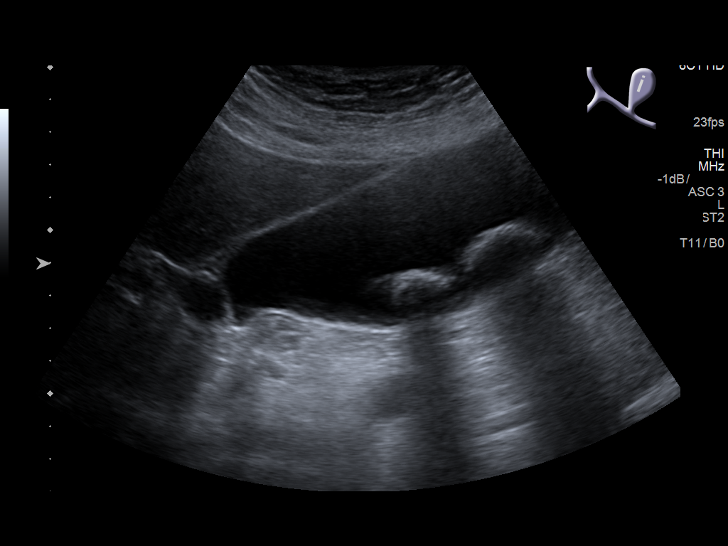
[im 18/54]
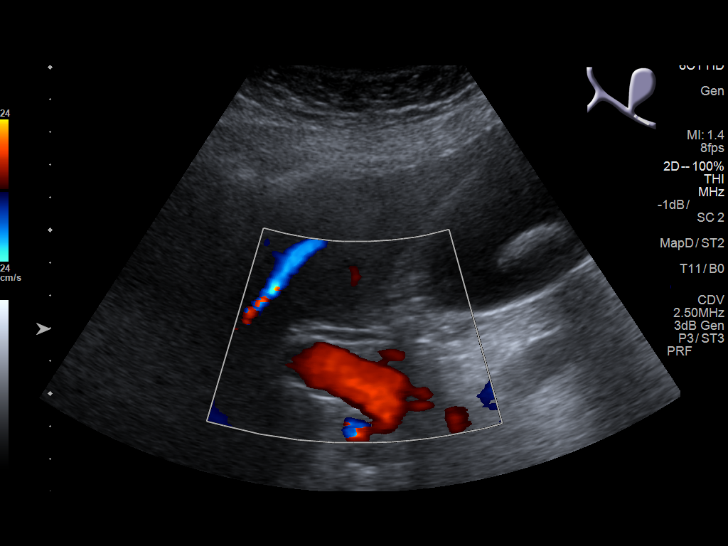
[im 20/54]
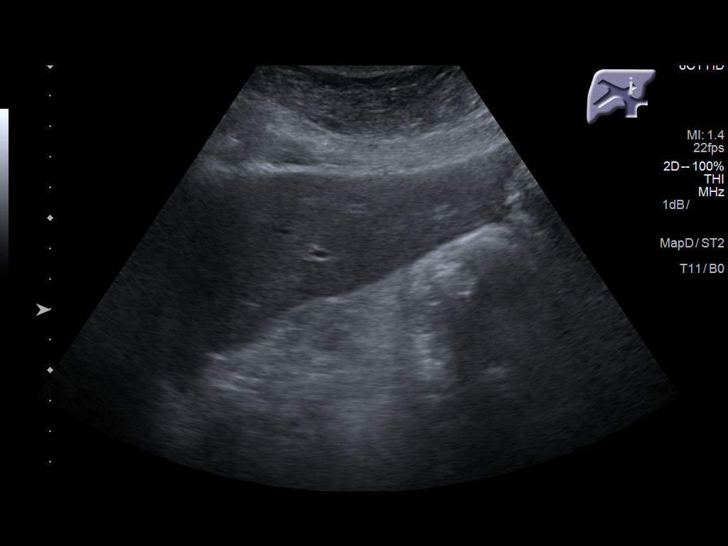
[im 25/54]
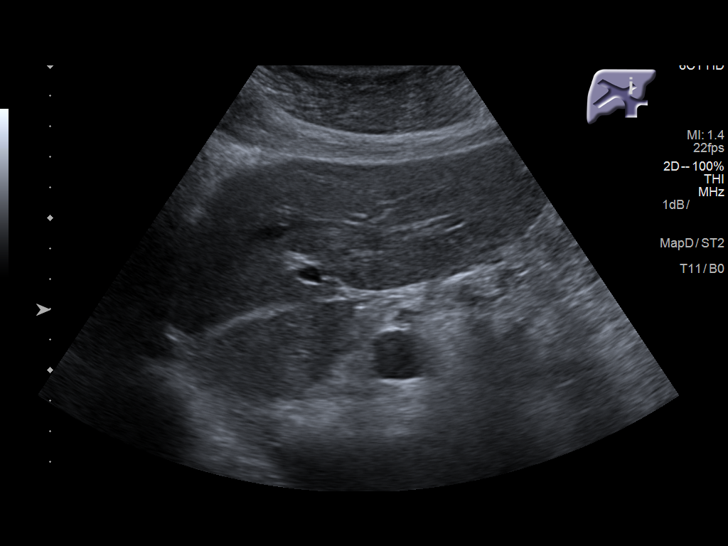
[im 29/54]
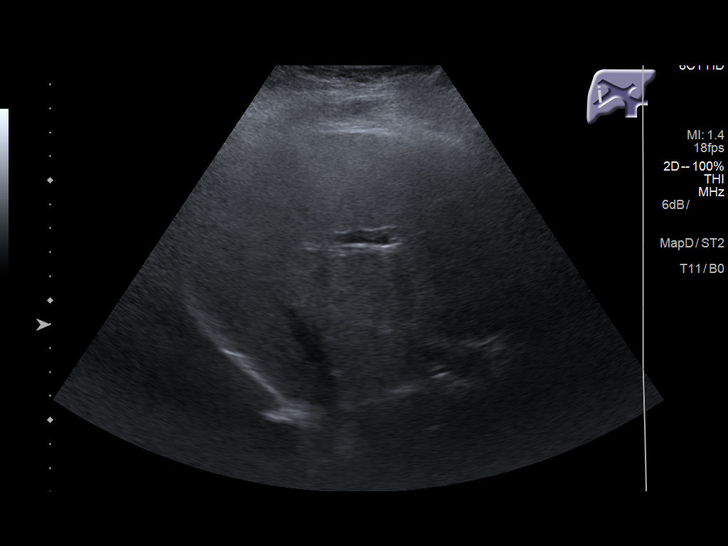
[im 34/54]
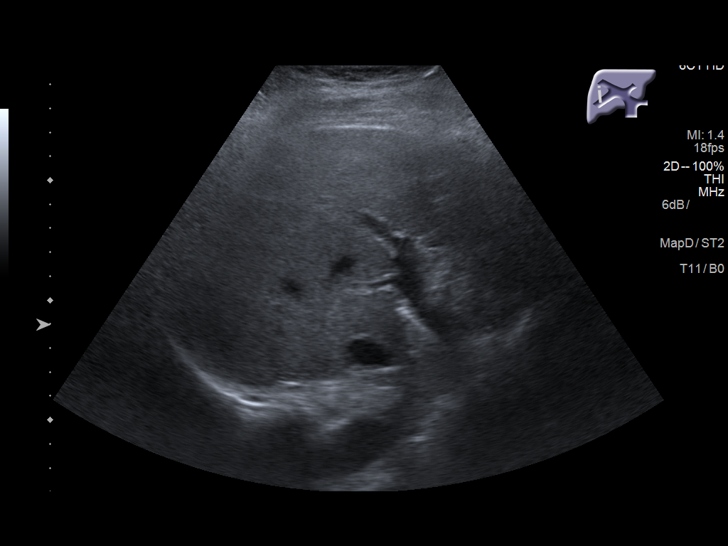
[im 36/54]
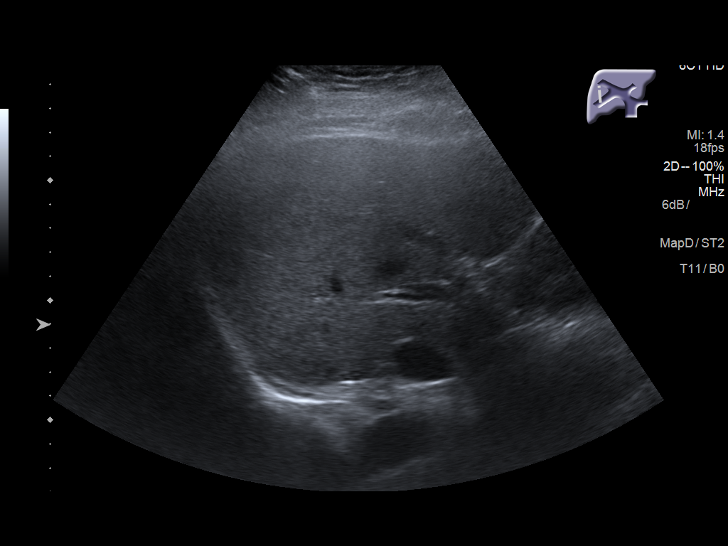
[im 40/54]
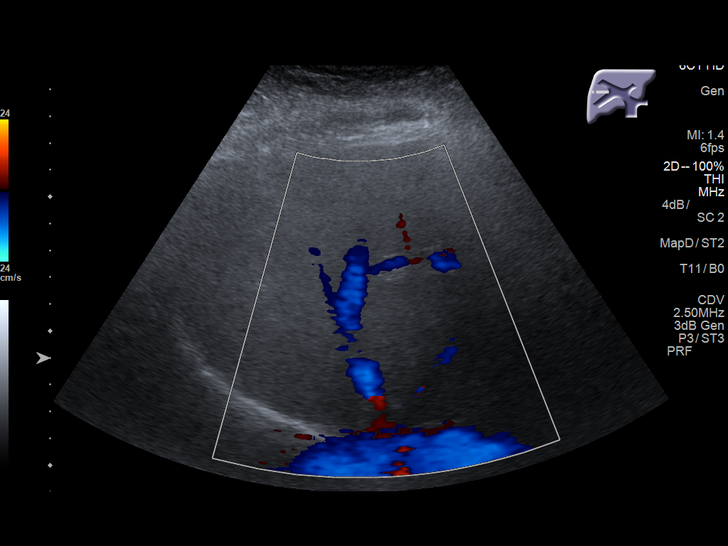
[im 45/54]
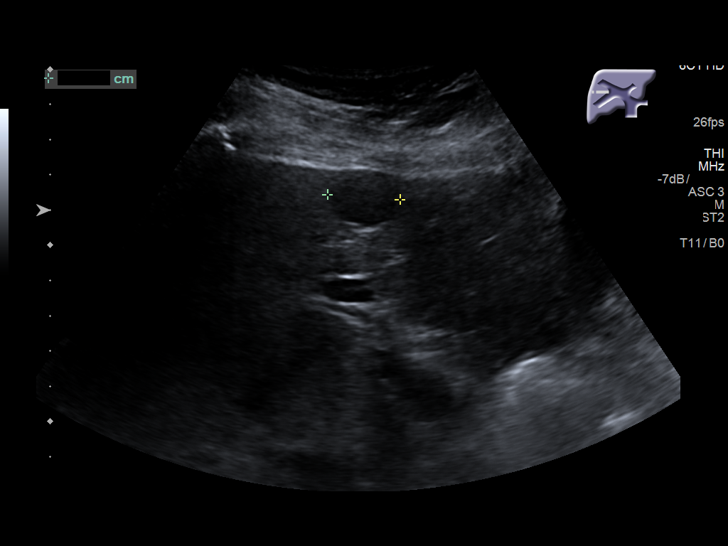
[im 49/54]
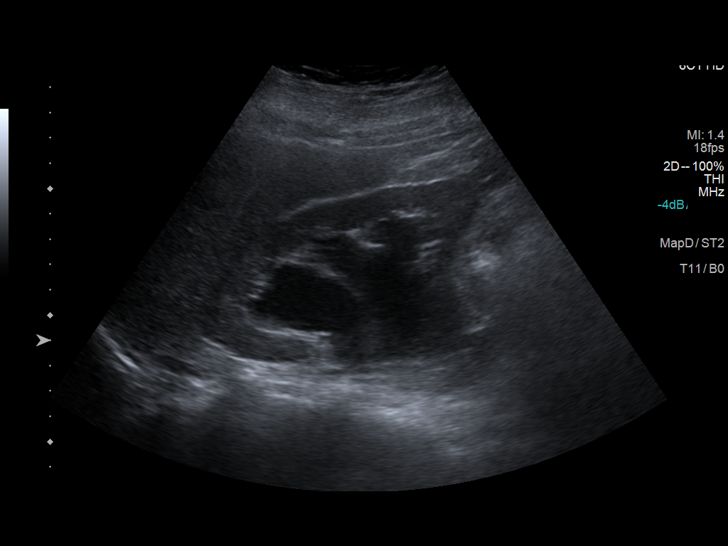
[im 54/54]
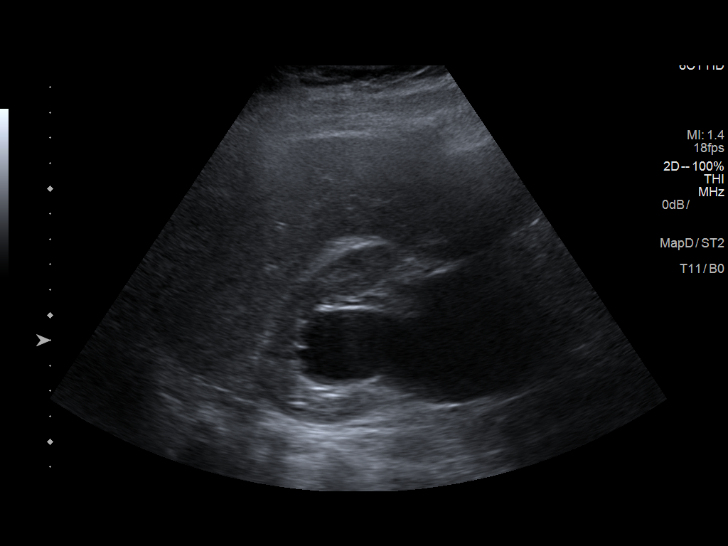

[14 of 25 positions shown; findings below may reference images not displayed]

FINDINGS: Gallbladder:

Multiple gallstones are noted.  Largest measures 2.7 cm

Common bile duct:

Diameter: 2.8 mm

Liver:

2.3 cm simple cyst in the right hepatic lobe. Portal vein is patent
on color Doppler imaging with normal direction of blood flow towards
the liver. Incidental note is made of prominent right
hydronephrosis.
IMPRESSION: 1. Multiple gallstones. No evidence of cholecystitis or biliary
distention.

2. Increased hepatic echogenicity consistent fatty infiltration
and/or hepatocellular disease. 2.3 cm simple cyst in the right
hepatic lobe noted. This is stable.

3. Incidental note most is made of prominent right hydronephrosis .

## 2017-11-17 ENCOUNTER — Ambulatory Visit: Payer: Federal, State, Local not specified - PPO | Admitting: Internal Medicine

## 2017-11-17 ENCOUNTER — Encounter: Payer: Self-pay | Admitting: Internal Medicine

## 2017-11-17 VITALS — BP 118/80 | HR 83 | Ht 67.5 in | Wt 249.5 lb

## 2017-11-17 DIAGNOSIS — R011 Cardiac murmur, unspecified: Secondary | ICD-10-CM | POA: Diagnosis not present

## 2017-11-17 DIAGNOSIS — R600 Localized edema: Secondary | ICD-10-CM

## 2017-11-17 DIAGNOSIS — Z136 Encounter for screening for cardiovascular disorders: Secondary | ICD-10-CM

## 2017-11-17 DIAGNOSIS — R42 Dizziness and giddiness: Secondary | ICD-10-CM | POA: Insufficient documentation

## 2017-11-17 NOTE — Patient Instructions (Signed)
Medication Instructions: - Your physician recommends that you continue on your current medications as directed. Please refer to the Current Medication list given to you today.  Labwork: - Your physician recommends that you return for a FASTING lipid profile: the day of your carotid ultrasound/ echo  Procedures/Testing: - Your physician has requested that you have a carotid duplex (early in the AM as possible). This test is an ultrasound of the carotid arteries in your neck. It looks at blood flow through these arteries that supply the brain with blood. Allow one hour for this exam. There are no restrictions or special instructions.  - Your physician has requested that you have an echocardiogram (early in the AM as possible). Echocardiography is a painless test that uses sound waves to create images of your heart. It provides your doctor with information about the size and shape of your heart and how well your heart's chambers and valves are working. This procedure takes approximately one hour. There are no restrictions for this procedure.  Follow-Up: - Your physician recommends that you schedule a follow-up appointment in: 3 months with Dr. Saunders Revel.   Any Additional Special Instructions Will Be Listed Below (If Applicable).     If you need a refill on your cardiac medications before your next appointment, please call your pharmacy.

## 2017-11-17 NOTE — Progress Notes (Signed)
New Outpatient Visit Date: 11/17/2017  Primary Care Provider: [facility] [address] [place], [place] [address]  Chief Complaint: Dizziness  HPI:  [name] is a 62 y.o. female who is being seen today for the evaluation of chest pain after emergency department visit in late December.  She presented to the [facility] ED with dizziness and chest discomfort (though today she denies any chest pain). She has a history of ulcerative colitis, cholecystitis, and incisional hernia.  Emergency department evaluation, including EKG and troponin x1, was unrevealing.  She was discharged with diagnoses of vertigo and anxiety, and was instructed to follow-up with cardiology.  Today, [name] reports that she is feeling relatively well.  Her dizziness, which she arrives as a feeling of things spinning around her, has resolved.  She again denies chest pain as well as shortness of breath, orthopnea, and PND.  Noted rare palpitations that she describes as a skipped beat.  There is no accompanying lightheadedness or other symptoms.  [name] has chronic leg edema and varicose veins, worse on the left.  This became more pronounced after total colectomy for ulcerative colitis a few years ago.  She notes that the leg edema improved somewhat with leg elevation.  It was also better when she was furloughed from her job earlier this year and was not sitting is much.  She was evaluated by a vein specialist several years ago.  No further intervention was recommended at that time.  [name] denies a history of prior heart disease.  She reports undergoing an echocardiogram while in the [facility] approximately 20 years ago.  She believes it was normal.  --------------------------------------------------------------------------------------------------  Cardiovascular History & Procedures: Cardiovascular Problems:  Atypical chest pain  Risk Factors:  Obesity and prior  tobacco use  Cath/PCI:  None  CV Surgery:  None  EP Procedures and Devices:  None  Non-Invasive Evaluation(s):  None available  Recent CV Pertinent Labs: Lab Results  Component Value Date   K 4.1 09/17/2017   K 4.1 02/26/2014   BUN 13 09/17/2017   BUN 14 02/26/2014   CREATININE 0.80 09/17/2017   CREATININE 0.90 02/26/2014    --------------------------------------------------------------------------------------------------  Past Medical History:  Diagnosis Date  . Anemia   . Claustrophobia    NO MEDS  . Family history of adverse reaction to anesthesia    1ST COUSIN-WAKE UP DURING SURGERY  . Liver cyst   . Ulcerative colitis ([place])   . Venous insufficiency     Past Surgical History:  Procedure Laterality Date  . BREAST BIOPSY Right 11/02/2011   ultrasound guided biopsy/clip-benign  . CHOLECYSTECTOMY N/A 07/29/2017   Procedure: LAPAROSCOPIC CHOLECYSTECTOMY;  Surgeon: Jules Husbands, MD;  Location: [facility] ORS;  Service: General;  Laterality: N/A;  . COLON SURGERY  2014, 2015   DUE TO ULERATIVE COLITIS WITH COLOSTOMY  . COLOSTOMY TAKEDOWN    . FOOT SURGERY Bilateral    BONE SPURS   . MANDIBLE FRACTURE SURGERY  1991  . MOUTH SURGERY    . VENTRAL HERNIA REPAIR N/A 07/29/2017   Procedure: LAPAROSCOPIC VENTRAL HERNIA;  Surgeon: Jules Husbands, MD;  Location: [facility] ORS;  Service: General;  Laterality: N/A;    Current Meds  Medication Sig  . ferrous sulfate 325 (65 FE) MG tablet Take 325 mg daily as needed by mouth.   . loperamide (IMODIUM) 2 MG capsule Take 2 mg as needed by mouth for diarrhea or loose stools (NORMALLY TAKES 2-3 TIMES  DAILY SINCE HAVING HER COLON SURGERY).   . Multiple Vitamin (MULTIVITAMIN) tablet Take 1 tablet by mouth daily.    Allergies: Caffeine; Chocolate; Cocoa; and Meclizine  Social History   Socioeconomic History  . Marital status: Divorced    Spouse name: Not on file  . Number of children: Not on file  . Years of education: Not on  file  . Highest education level: Not on file  Social Needs  . Financial resource strain: Not on file  . Food insecurity - worry: Not on file  . Food insecurity - inability: Not on file  . Transportation needs - medical: Not on file  . Transportation needs - non-medical: Not on file  Occupational History  . Not on file  Tobacco Use  . Smoking status: Former Smoker    Packs/day: 1.00    Years: 20.00    Pack years: 20.00    Types: Cigarettes    Last attempt to quit: 07/28/2005    Years since quitting: 12.3  . Smokeless tobacco: Never Used  Substance and Sexual Activity  . Alcohol use: No    Frequency: Never    Comment: RARE WINE  . Drug use: No  . Sexual activity: Not on file  Other Topics Concern  . Not on file  Social History Narrative  . Not on file    Family History  Problem Relation Age of Onset  . Heart attack Mother 34  . Hyperlipidemia Brother   . Breast cancer Neg Hx     Review of Systems: A 12-system review of systems was performed and was negative except as noted in the HPI.  --------------------------------------------------------------------------------------------------  Physical Exam: BP 118/80 (BP Location: Right Arm, Patient Position: Sitting, Cuff Size: Large)   Pulse 83   Ht 5' 7.5" (1.715 m)   Wt 249 lb 8 oz (113.2 kg)   BMI 38.50 kg/m   General: Obese woman, seated comfortably in the exam room. HEENT: No conjunctival pallor or scleral icterus. Moist mucous membranes. OP clear. Neck: Supple without lymphadenopathy, thyromegaly, JVD, or HJR. No carotid bruit. Lungs: Normal work of breathing. Clear to auscultation bilaterally without wheezes or crackles. Heart: Regular rate and rhythm with 1/6 systolic murmur loudest at the right upper sternal border.  No rubs or gallops.  Unable to assess PMI due to body habitus. Abd: Bowel sounds present. Soft, NT/ND.  Unable to assess HSM due to body habitus. Ext: 1-2+ left and trace right nonpitting pretibial  edema.  Prominent varicose veins noted, left greater than right.. Radial, PT, and DP pulses are 2+ bilaterally Skin: Warm and dry without rash. Neuro: CNIII-XII intact. Strength and fine-touch sensation intact in upper and lower extremities bilaterally. Psych: Normal mood and affect.  EKG: Normal sinus rhythm with low voltage.  Otherwise, no significant abnormalities.  Lab Results  Component Value Date   WBC 7.4 09/17/2017   HGB 14.5 09/17/2017   HCT 45.1 09/17/2017   MCV 88.2 09/17/2017   PLT 251 09/17/2017    Lab Results  Component Value Date   NA 139 09/17/2017   K 4.1 09/17/2017   CL 106 09/17/2017   CO2 26 09/17/2017   BUN 13 09/17/2017   CREATININE 0.80 09/17/2017   GLUCOSE 140 (H) 09/17/2017   ALT 16 07/15/2017    No results found for: CHOL, HDL, LDLCALC, LDLDIRECT, TRIG, CHOLHDL  --------------------------------------------------------------------------------------------------  ASSESSMENT AND PLAN: Dizziness and heart murmur Description of symptoms is most consistent with vertigo.  I have a low suspicion for  cardiac etiology.  However, in the setting of edema and heart murmur, we have agreed to obtain a transthoracic echocardiogram.  We will also obtain carotid Dopplers.  Lower extremity edema Most likely due to venous insufficiency and varicose veins.  I have recommended sodium restriction and elevation of the legs.  I have also provided her with a prescription for knee-high compression stockings.  If swelling does not improve or worsening pain develops, will need to refer her to a vein specialist for further assessment and treatment.  Atherosclerotic cardiovascular disease screening Cardiac risk factors include obesity and prior tobacco use.  Patient is concerned about potential for cardiovascular disease.  As above, we will obtain an echo and carotid Dopplers.  I will also plan for a lipid panel when the patient returns for her tests for further risk  stratification.  Follow-up: Return to clinic in 3 months.  Nelva Bush, MD 11/17/2017 8:38 PM

## 2017-12-10 ENCOUNTER — Other Ambulatory Visit: Payer: Self-pay

## 2017-12-16 ENCOUNTER — Ambulatory Visit (INDEPENDENT_AMBULATORY_CARE_PROVIDER_SITE_OTHER): Payer: Federal, State, Local not specified - PPO

## 2017-12-16 ENCOUNTER — Other Ambulatory Visit: Payer: Self-pay

## 2017-12-16 ENCOUNTER — Other Ambulatory Visit (INDEPENDENT_AMBULATORY_CARE_PROVIDER_SITE_OTHER): Payer: Federal, State, Local not specified - PPO | Admitting: *Deleted

## 2017-12-16 DIAGNOSIS — Z136 Encounter for screening for cardiovascular disorders: Secondary | ICD-10-CM

## 2017-12-16 DIAGNOSIS — R011 Cardiac murmur, unspecified: Secondary | ICD-10-CM

## 2017-12-16 DIAGNOSIS — R42 Dizziness and giddiness: Secondary | ICD-10-CM | POA: Diagnosis not present

## 2017-12-17 LAB — LIPID PANEL
CHOLESTEROL TOTAL: 149 mg/dL (ref 100–199)
Chol/HDL Ratio: 2 ratio (ref 0.0–4.4)
HDL: 75 mg/dL (ref >39)
LDL Calculated: 63 mg/dL (ref 0–99)
Triglycerides: 54 mg/dL (ref 0–149)
VLDL CHOLESTEROL CAL: 11 mg/dL (ref 5–40)

## 2017-12-23 ENCOUNTER — Telehealth: Payer: Self-pay | Admitting: *Deleted

## 2017-12-23 NOTE — Telephone Encounter (Signed)
Called patient. She was apologetic to me for having to call her several times to explain the results to her. I said it was no problem and I understood her concern and worry. She was very [employer] attorney.

## 2017-12-23 NOTE — Telephone Encounter (Signed)
Patient called and states she called serval times about her results and she wanted to let Haley Deleon know that she apologize if she came across as being aggravating regarding these results. Patient states after she thought about the results that Haley Deleon gave her she understood what she was talking about. Patient states Haley Deleon can call her back regarding this matter. Her contact #  Is [phone]. Please advise. Thank you

## 2018-01-27 ENCOUNTER — Ambulatory Visit: Payer: Self-pay | Admitting: Internal Medicine

## 2018-03-23 ENCOUNTER — Other Ambulatory Visit: Payer: Self-pay | Admitting: Obstetrics and Gynecology

## 2018-03-23 DIAGNOSIS — Z1231 Encounter for screening mammogram for malignant neoplasm of breast: Secondary | ICD-10-CM

## 2018-04-06 ENCOUNTER — Ambulatory Visit: Payer: Self-pay | Admitting: Internal Medicine

## 2018-04-06 ENCOUNTER — Ambulatory Visit: Payer: Federal, State, Local not specified - PPO | Admitting: Internal Medicine

## 2018-04-18 ENCOUNTER — Ambulatory Visit
Admission: RE | Admit: 2018-04-18 | Discharge: 2018-04-18 | Disposition: A | Discharge status: Home / Self Care | Payer: Federal, State, Local not specified - PPO | Source: Ambulatory Visit | Attending: Obstetrics and Gynecology | Admitting: Obstetrics and Gynecology

## 2018-04-18 DIAGNOSIS — Z1231 Encounter for screening mammogram for malignant neoplasm of breast: Secondary | ICD-10-CM | POA: Insufficient documentation

## 2019-08-07 ENCOUNTER — Emergency Department: Payer: Federal, State, Local not specified - PPO

## 2019-08-07 ENCOUNTER — Encounter: Payer: Self-pay | Admitting: Intensive Care

## 2019-08-07 ENCOUNTER — Emergency Department
Admission: EM | Admit: 2019-08-07 | Discharge: 2019-08-07 | Disposition: A | Discharge status: Home / Self Care | Payer: Federal, State, Local not specified - PPO | Attending: Emergency Medicine | Admitting: Emergency Medicine

## 2019-08-07 ENCOUNTER — Other Ambulatory Visit: Payer: Self-pay

## 2019-08-07 DIAGNOSIS — Z87891 Personal history of nicotine dependence: Secondary | ICD-10-CM | POA: Insufficient documentation

## 2019-08-07 DIAGNOSIS — Z79899 Other long term (current) drug therapy: Secondary | ICD-10-CM | POA: Diagnosis not present

## 2019-08-07 DIAGNOSIS — R079 Chest pain, unspecified: Secondary | ICD-10-CM | POA: Insufficient documentation

## 2019-08-07 DIAGNOSIS — R42 Dizziness and giddiness: Secondary | ICD-10-CM | POA: Insufficient documentation

## 2019-08-07 DIAGNOSIS — R0789 Other chest pain: Secondary | ICD-10-CM

## 2019-08-07 LAB — BASIC METABOLIC PANEL
Anion gap: 11 (ref 5–15)
BUN: 17 mg/dL (ref 8–23)
CO2: 25 mmol/L (ref 22–32)
Calcium: 9.2 mg/dL (ref 8.9–10.3)
Chloride: 105 mmol/L (ref 98–111)
Creatinine, Ser: 0.97 mg/dL (ref 0.44–1.00)
GFR calc Af Amer: >60 mL/min (ref >60)
GFR calc non Af Amer: >60 mL/min (ref >60)
Glucose, Bld: 144 mg/dL — ABNORMAL HIGH (ref 70–99)
Potassium: 4 mmol/L (ref 3.5–5.1)
Sodium: 141 mmol/L (ref 135–145)

## 2019-08-07 LAB — CBC
HCT: 43.5 % (ref 36.0–46.0)
Hemoglobin: 14.6 g/dL (ref 12.0–15.0)
MCH: 29.1 pg (ref 26.0–34.0)
MCHC: 33.6 g/dL (ref 30.0–36.0)
MCV: 86.7 fL (ref 80.0–100.0)
Platelets: 235 10*3/uL (ref 150–400)
RBC: 5.02 MIL/uL (ref 3.87–5.11)
RDW: 13.1 % (ref 11.5–15.5)
WBC: 8 10*3/uL (ref 4.0–10.5)
nRBC: 0 % (ref 0.0–0.2)

## 2019-08-07 LAB — FIBRIN DERIVATIVES D-DIMER (ARMC ONLY): Fibrin derivatives D-dimer (ARMC): 878.22 ng/mL (FEU) — ABNORMAL HIGH (ref 0.00–499.00)

## 2019-08-07 LAB — TROPONIN I (HIGH SENSITIVITY)
Troponin I (High Sensitivity): 3 ng/L (ref <18)
Troponin I (High Sensitivity): 6 ng/L (ref <18)

## 2019-08-07 MED ORDER — IOHEXOL 350 MG/ML SOLN
75.0000 mL | Freq: Once | INTRAVENOUS | Status: AC | PRN
  Administered 2019-08-07: 22:00:00 75 mL via INTRAVENOUS

## 2019-08-07 NOTE — ED Triage Notes (Signed)
Patient c/o dull chest pain intermittently. She felt her heart racing and dizziness right before chest pain incident. Denies pain in triage at this moment. A&O x4 in triage

## 2019-08-07 NOTE — ED Provider Notes (Signed)
[facility] Emergency Department Provider Note   ____________________________________________   I have reviewed the triage vital signs and the nursing notes.   HISTORY  Chief Complaint Chest Pain   History limited by: Not Limited   HPI Haley Deleon is a 63 y.o. female who presents to the emergency department today because of concern for an episode of chest pain and dizziness. The symptoms started roughly 30 minutes after the patient ate pork sandwiches. The patient describes the chest pain as being located in the middle of her chest. The pain was short lived. The patient says that she also felt a slight headache and had some dizziness. The patient has had vertigo in the past but has never had it with chest pain. Mother had heart disease. At the time of my exam the patient states that her symptoms have improved.    Records reviewed. Per medical record review patient has a history of anemia, UC, dizziness.   Past Medical History:  Diagnosis Date  . Anemia   . Claustrophobia    NO MEDS  . Family history of adverse reaction to anesthesia    1ST COUSIN-WAKE UP DURING SURGERY  . Liver cyst   . Ulcerative colitis ([place])   . Venous insufficiency     Patient Active Problem List   Diagnosis Date Noted  . Dizziness 11/17/2017  . Murmur, cardiac 11/17/2017  . Screening for cardiovascular condition 11/17/2017  . Lower extremity edema 11/17/2017  . Gallstones and inflammation of gallbladder without obstruction   . Incisional hernia, without obstruction or gangrene   . History of biliary T-tube placement 02/24/2014  . Cellulitis 02/24/2014  . Cellulitis and abscess 02/24/2014  . Ulcerative colitis ([place]) 08/22/2013    Past Surgical History:  Procedure Laterality Date  . BREAST BIOPSY Right 11/02/2011   ultrasound guided biopsy/clip-benign  . CHOLECYSTECTOMY N/A 07/29/2017   Procedure: LAPAROSCOPIC CHOLECYSTECTOMY;  Surgeon: Jules Husbands, MD;  Location:  [facility] ORS;  Service: General;  Laterality: N/A;  . COLON SURGERY  2014, 2015   DUE TO ULERATIVE COLITIS WITH COLOSTOMY  . COLOSTOMY TAKEDOWN    . FOOT SURGERY Bilateral    BONE SPURS   . MANDIBLE FRACTURE SURGERY  1991  . MOUTH SURGERY    . VENTRAL HERNIA REPAIR N/A 07/29/2017   Procedure: LAPAROSCOPIC VENTRAL HERNIA;  Surgeon: Jules Husbands, MD;  Location: [facility] ORS;  Service: General;  Laterality: N/A;    Prior to Admission medications   Medication Sig Start Date End Date Taking? Authorizing Provider  ferrous sulfate 325 (65 FE) MG tablet Take 325 mg daily as needed by mouth.     [provider]  loperamide (IMODIUM) 2 MG capsule Take 2 mg as needed by mouth for diarrhea or loose stools (NORMALLY TAKES 2-3 TIMES DAILY SINCE HAVING HER COLON SURGERY).     [provider]  Multiple Vitamin (MULTIVITAMIN) tablet Take 1 tablet by mouth daily.    [provider]    Allergies Caffeine, Chocolate, Cocoa, and Meclizine  Family History  Problem Relation Age of Onset  . Heart attack Mother 24  . Hyperlipidemia Brother   . Breast cancer Neg Hx     Social History Social History   Tobacco Use  . Smoking status: Former Smoker    Packs/day: 1.00    Years: 20.00    Pack years: 20.00    Types: Cigarettes    Quit date: 07/28/2005    Years since quitting: 14.0  . Smokeless  tobacco: Never Used  Substance Use Topics  . Alcohol use: No    Frequency: Never    Comment: RARE WINE  . Drug use: No    Review of Systems Constitutional: No fever/chills Eyes: No visual changes. ENT: No sore throat. Cardiovascular: Positive for chest pain. Respiratory: Denies shortness of breath. Gastrointestinal: No abdominal pain.  No nausea, no vomiting.  No diarrhea.   Genitourinary: Negative for dysuria. Musculoskeletal: Negative for back pain. Skin: Negative for rash. Neurological: Positive for dizziness.  ____________________________________________   PHYSICAL  EXAM:  VITAL SIGNS: ED Triage Vitals [08/07/19 1547]  Enc Vitals Group     BP (!) 143/81     Pulse Rate 85     Resp 16     Temp 98.3 F (36.8 C)     Temp Source Oral     SpO2 97 %     Weight 260 lb (117.9 kg)     Height 5' 7"  (1.702 m)     Head Circumference      Peak Flow      Pain Score 0   Constitutional: Alert and oriented.  Eyes: Conjunctivae are normal.  ENT      Head: Normocephalic and atraumatic.      Nose: No congestion/rhinnorhea.      Mouth/Throat: Mucous membranes are moist.      Neck: No stridor. Hematological/Lymphatic/Immunilogical: No cervical lymphadenopathy. Cardiovascular: Tachycardic, regular rhythm.  No murmurs, rubs, or gallops.  Respiratory: Normal respiratory effort without tachypnea nor retractions. Breath sounds are clear and equal bilaterally. No wheezes/rales/rhonchi. Gastrointestinal: Soft and non tender. No rebound. No guarding.  Genitourinary: Deferred Musculoskeletal: Normal range of motion in all extremities. No lower extremity edema. Neurologic:  Normal speech and language. No gross focal neurologic deficits are appreciated.  Skin:  Skin is warm, dry and intact. No rash noted. Psychiatric: Mood and affect are normal. Speech and behavior are normal. Patient exhibits appropriate insight and judgment.  ____________________________________________    LABS (pertinent positives/negatives)  BMP wnl except glu 144 CBC wbc 8.0, hgb 14.6, plt 235 Trop hs 3->6 ____________________________________________   EKG  I, Nance Pear, attending physician, personally viewed and interpreted this EKG  EKG Time: 1534 Rate: 86 Rhythm: normal sinus rhythm Axis: normal Intervals: qtc 428 QRS: narrow, q waves v1 ST changes: no st elevation Impression: abnormal ekg ____________________________________________    RADIOLOGY  CXR Coarse interstitial  disease  ____________________________________________   PROCEDURES  Procedures  ____________________________________________   INITIAL IMPRESSION / ASSESSMENT AND PLAN / ED COURSE  Pertinent labs & imaging results that were available during my care of the patient were reviewed by me and considered in my medical decision making (see chart for details).   Patient presented to the emergency department today because of concern for an episode of chest pain and dizziness. Patient was slightly tachycardic on my exam. Patient had negative troponin. CXR was negative. Did get d-dimer given chest pain, tachycardia and dizziness. This was elevated however CT angio was negative for blood clot. Discussed the finding with the patient. Will plan on discharging home.   ____________________________________________   FINAL CLINICAL IMPRESSION(S) / ED DIAGNOSES  Final diagnoses:  Atypical chest pain  Dizziness     Note: This dictation was prepared with Dragon dictation. Any transcriptional errors that result from this process are unintentional     Nance Pear, MD 08/07/19 2306

## 2019-08-07 NOTE — ED Notes (Signed)
Lab notified of D-Dimer lab test as ordered by Dr Archie Balboa at this time. Lab tech reports they have a light blue tube that was collected in Triage that they can use and will begin processing at this time.

## 2019-08-07 NOTE — Discharge Instructions (Addendum)
Please seek medical attention for any high fevers, chest pain, shortness of breath, change in behavior, persistent vomiting, bloody stool or any other new or concerning symptoms.  

## 2019-10-02 ENCOUNTER — Other Ambulatory Visit: Payer: Self-pay | Admitting: Obstetrics and Gynecology

## 2019-10-02 DIAGNOSIS — Z1231 Encounter for screening mammogram for malignant neoplasm of breast: Secondary | ICD-10-CM

## 2019-10-12 ENCOUNTER — Other Ambulatory Visit: Payer: Self-pay

## 2019-10-12 ENCOUNTER — Ambulatory Visit
Admission: RE | Admit: 2019-10-12 | Discharge: 2019-10-12 | Disposition: A | Discharge status: Home / Self Care | Payer: Federal, State, Local not specified - PPO | Source: Ambulatory Visit | Attending: Obstetrics and Gynecology | Admitting: Obstetrics and Gynecology

## 2019-10-12 DIAGNOSIS — Z1231 Encounter for screening mammogram for malignant neoplasm of breast: Secondary | ICD-10-CM | POA: Diagnosis present

## 2020-01-10 ENCOUNTER — Ambulatory Visit: Payer: Federal, State, Local not specified - PPO | Admitting: Nurse Practitioner

## 2020-01-10 ENCOUNTER — Encounter: Payer: Self-pay | Admitting: Nurse Practitioner

## 2020-01-10 NOTE — Progress Notes (Deleted)
Office Visit    Patient Name: Haley Deleon Date of Encounter: 01/10/2020  Primary Care Provider:  [facility] Primary Cardiologist:  Nelva Bush, MD  Chief Complaint    64 year old female with a history of ulcerative colitis, cholecystitis, incisional hernia, heart murmur, venous insufficiency, and atypical chest pain, who presents for follow-up related to ***.  Past Medical History    Past Medical History:  Diagnosis Date  . Anemia   . Atypical chest pain   . Carotid arterial disease ([place])    a. 11/2017 Carotid U/S: RICA <54, LICA nl.  . Claustrophobia    NO MEDS  . Diastolic dysfunction    a. 11/2017 Echo: EF 60-65%, no rwma, Gr1 DD. Nl RV fxn.  . Family history of adverse reaction to anesthesia    1ST COUSIN-WAKE UP DURING SURGERY  . Liver cyst   . Ulcerative colitis ([place])   . Venous insufficiency    Past Surgical History:  Procedure Laterality Date  . BREAST BIOPSY Right 11/02/2011   ultrasound guided biopsy/clip-benign  . CHOLECYSTECTOMY N/A 07/29/2017   Procedure: LAPAROSCOPIC CHOLECYSTECTOMY;  Surgeon: Jules Husbands, MD;  Location: [facility] ORS;  Service: General;  Laterality: N/A;  . COLON SURGERY  2014, 2015   DUE TO ULERATIVE COLITIS WITH COLOSTOMY  . COLOSTOMY TAKEDOWN    . FOOT SURGERY Bilateral    BONE SPURS   . MANDIBLE FRACTURE SURGERY  1991  . MOUTH SURGERY    . VENTRAL HERNIA REPAIR N/A 07/29/2017   Procedure: LAPAROSCOPIC VENTRAL HERNIA;  Surgeon: Jules Husbands, MD;  Location: [facility] ORS;  Service: General;  Laterality: N/A;    Allergies  Allergies  Allergen Reactions  . Caffeine Anaphylaxis  . Chocolate Anaphylaxis  . Cocoa Anaphylaxis  . Meclizine     Vaginal bleeding and headache.    History of Present Illness    63 year old female with a history of ulcerative colitis, cholecystitis, incisional hernia, heart murmur, venous insufficiency, and atypical chest pain.  She was previously seen in cardiology clinic  in February 2019 in the setting of dizziness and cardiac murmur.  Symptoms were felt to be most consistent with vertigo.  She also reported lower extremity swelling which was felt to be secondary to venous insufficiency and varicose veins.  She underwent 2D echocardiography which showed normal LV function and grade 1 diastolic dysfunction.  Carotid ultrasound was performed for cardiovascular risk screening and showed a less than 50% right internal carotid artery stenosis.  In the setting of an LDL of 63, conservative management was recommended.  More recently, she was seen in the emergency department in November 2020 in the setting of chest pain and dizziness that occurred after eating a pork sandwich.  High-sensitivity troponin was normal however D-dimer was elevated.  CT angio of the chest was negative for PE and she was discharged home.  Home Medications    Prior to Admission medications   Medication Sig Start Date End Date Taking? Authorizing Provider  ferrous sulfate 325 (65 FE) MG tablet Take 325 mg daily as needed by mouth.     [provider]  loperamide (IMODIUM) 2 MG capsule Take 2 mg as needed by mouth for diarrhea or loose stools (NORMALLY TAKES 2-3 TIMES DAILY SINCE HAVING HER COLON SURGERY).     [provider]  Multiple Vitamin (MULTIVITAMIN) tablet Take 1 tablet by mouth daily.    [provider]    Review of Systems    ***.  All other systems reviewed and are otherwise negative except as noted above.  Physical Exam    VS:  There were no vitals taken for this visit. , BMI There is no height or weight on file to calculate BMI. GEN: Well nourished, well developed, in no acute distress. HEENT: normal. Neck: Supple, no JVD, carotid bruits, or masses. Cardiac: RRR, no murmurs, rubs, or gallops. No clubbing, cyanosis, edema.  Radials/DP/PT 2+ and equal bilaterally.  Respiratory:  Respirations regular and unlabored, clear to auscultation bilaterally. GI:  Soft, nontender, nondistended, BS + x 4. MS: no deformity or atrophy. Skin: warm and dry, no rash. Neuro:  Strength and sensation are intact. Psych: Normal affect.  Accessory Clinical Findings    ECG personally reviewed by me today - *** - no acute changes.  Lab Results  Component Value Date   WBC 8.0 08/07/2019   HGB 14.6 08/07/2019   HCT 43.5 08/07/2019   MCV 86.7 08/07/2019   PLT 235 08/07/2019   Lab Results  Component Value Date   CREATININE 0.97 08/07/2019   BUN 17 08/07/2019   NA 141 08/07/2019   K 4.0 08/07/2019   CL 105 08/07/2019   CO2 25 08/07/2019   Lab Results  Component Value Date   ALT 16 07/15/2017   AST 23 07/15/2017   ALKPHOS 98 07/15/2017   BILITOT 0.9 07/15/2017   Lab Results  Component Value Date   CHOL 149 12/16/2017   HDL 75 12/16/2017   LDLCALC 63 12/16/2017   TRIG 54 12/16/2017   CHOLHDL 2.0 12/16/2017    No results found for: HGBA1C  Assessment & Plan    1.  ***   Murray Hodgkins, NP 01/10/2020, 9:59 AM

## 2020-01-11 ENCOUNTER — Telehealth (INDEPENDENT_AMBULATORY_CARE_PROVIDER_SITE_OTHER): Payer: Federal, State, Local not specified - PPO | Admitting: Internal Medicine

## 2020-01-11 ENCOUNTER — Encounter: Payer: Self-pay | Admitting: Internal Medicine

## 2020-01-11 ENCOUNTER — Telehealth: Payer: Self-pay

## 2020-01-11 ENCOUNTER — Telehealth: Payer: Self-pay | Admitting: Internal Medicine

## 2020-01-11 ENCOUNTER — Other Ambulatory Visit: Payer: Self-pay

## 2020-01-11 VITALS — BP 127/84 | HR 80 | Ht 67.5 in | Wt 240.0 lb

## 2020-01-11 DIAGNOSIS — R600 Localized edema: Secondary | ICD-10-CM | POA: Diagnosis not present

## 2020-01-11 DIAGNOSIS — R42 Dizziness and giddiness: Secondary | ICD-10-CM | POA: Diagnosis not present

## 2020-01-11 NOTE — Telephone Encounter (Signed)
Verbally consented    Patient Consent for Virtual Visit     {  Haley Deleon has provided verbal consent on 01/11/2020 for a virtual visit (video or telephone).   CONSENT FOR VIRTUAL VISIT FOR:  Haley Deleon  By participating in this virtual visit I agree to the following:  I hereby voluntarily request, consent and authorize [facility] and its employed or contracted physicians, physician assistants, nurse practitioners or other licensed health care professionals (the Practitioner), to provide me with telemedicine health care services (the "Services") as deemed necessary by the treating Practitioner. I acknowledge and consent to receive the Services by the Practitioner via telemedicine. I understand that the telemedicine visit will involve communicating with the Practitioner through live audiovisual communication technology and the disclosure of certain medical information by electronic transmission. I acknowledge that I have been given the opportunity to request an in-person assessment or other available alternative prior to the telemedicine visit and am voluntarily participating in the telemedicine visit.  I understand that I have the right to withhold or withdraw my consent to the use of telemedicine in the course of my care at any time, without affecting my right to future care or treatment, and that the Practitioner or I may terminate the telemedicine visit at any time. I understand that I have the right to inspect all information obtained and/or recorded in the course of the telemedicine visit and may receive copies of available information for a reasonable fee.  I understand that some of the potential risks of receiving the Services via telemedicine include:  Marland Kitchen Delay or interruption in medical evaluation due to technological equipment failure or disruption; . Information transmitted may not be sufficient (e.g. poor resolution of images) to allow for appropriate medical decision making  by the Practitioner; and/or  . In rare instances, security protocols could fail, causing a breach of personal health information.  Furthermore, I acknowledge that it is my responsibility to provide information about my medical history, conditions and care that is complete and accurate to the best of my ability. I acknowledge that Practitioner's advice, recommendations, and/or decision may be based on factors not within their control, such as incomplete or inaccurate data provided by me or distortions of diagnostic images or specimens that may result from electronic transmissions. I understand that the practice of medicine is not an exact science and that Practitioner makes no warranties or guarantees regarding treatment outcomes. I acknowledge that a copy of this consent can be made available to me via my patient portal ([facility]), or I can request a printed copy by calling the office of [facility].    I understand that my insurance will be billed for this visit.   I have read or had this consent read to me. . I understand the contents of this consent, which adequately explains the benefits and risks of the Services being provided via telemedicine.  . I have been provided ample opportunity to ask questions regarding this consent and the Services and have had my questions answered to my satisfaction. . I give my informed consent for the services to be provided through the use of telemedicine in my medical care

## 2020-01-11 NOTE — Telephone Encounter (Signed)
Allergies updated: meclizine removed and meloxicam added.  Nelva Bush, MD [facility]

## 2020-01-11 NOTE — Patient Instructions (Signed)
Medication Instructions:  Your physician recommends that you continue on your current medications as directed. Please refer to the Current Medication list given to you today.  *If you need a refill on your cardiac medications before your next appointment, please call your pharmacy*   Follow-Up: At [facility], you and your health needs are our priority.  As part of our continuing mission to provide you with exceptional heart care, we have created designated Provider Care Teams.  These Care Teams include your primary Cardiologist (physician) and Advanced Practice Providers (APPs -  Physician Assistants and Nurse Practitioners) who all work together to provide you with the care you need, when you need it.  We recommend signing up for the patient portal called "MyChart".  Sign up information is provided on this After Visit Summary.  MyChart is used to connect with patients for Virtual Visits (Telemedicine).  Patients are able to view lab/test results, encounter notes, upcoming appointments, etc.  Non-urgent messages can be sent to your provider as well.   To learn more about what you can do with MyChart, go to [email or web address].    Your next appointment:   6 month(s)  The format for your next appointment:   In Person  Provider:    You may see Nelva Bush, MD or one of the following Advanced Practice Providers on your designated Care Team:    Murray Hodgkins, NP  Christell Faith, PA-C  Marrianne Mood, PA-C

## 2020-01-11 NOTE — Progress Notes (Signed)
Virtual Visit via Telephone Note   This visit type was conducted due to national recommendations for restrictions regarding the COVID-19 Pandemic (e.g. social distancing) in an effort to limit this patient's exposure and mitigate transmission in our community.  Due to her co-morbid illnesses, this patient is at least at moderate risk for complications without adequate follow up.  This format is felt to be most appropriate for this patient at this time.  The patient did not have access to video technology/had technical difficulties with video requiring transitioning to audio format only (telephone).  All issues noted in this document were discussed and addressed.  No physical exam could be performed with this format.  Please refer to the patient's chart for her  consent to telehealth for [facility].   The patient was identified using 2 identifiers.  Date:  01/11/2020   ID:  Haley Deleon, Haley Deleon [birth year], MRN [id number]  Patient Location: Home Provider Location: Home  PCP:  [facility]  Cardiologist:  Nelva Bush, MD  Electrophysiologist:  None   Evaluation Performed:  Follow-Up Visit  Chief Complaint:  Leg swelling  History of Present Illness:    Haley Deleon is a 64 y.o. female with history of ulcerative colitis, cholecystitis, and incisional hernia.  We are speaking today evaluation of leg swelling (L>R).  I saw her once over 2 years ago for evaluation of chest pain following ED visit.  However, at the time, Haley Deleon was most concerned about dizziness and leg swelling.  Heart murmur was also noted.  Echocardiogram was recommended though normal LVEF with grade 1 diastolic dysfunction.  Carotid Dopplers demonstrated mild plaquing in the right common carotid artery.  Leg edema was felt to be most consistent with venous insufficiency.  51-monthfollow-up was recommended.  She subsequently saw a vein specialist and was told that her leg edema and varicose  veins were cosmetic.  She notes that compression stockings and leg elevation have not helped much.  Today, Haley Deleon concerned about continued leg swelling (still L>R).  There left ankle sometimes feels tight and heavy but is without pain.  She denies acute worsening of the chronic edema.  She has not had any recent surgery or prolonged travel.  Haley Deleon chest pain, shortness of breath, palpitations, and lightheadedness.  She presented to the ED last fall with dizziness and atypical chest pain.  She attributes her symptoms to eating a very salty meal.  ED workup was unrevealing.  She believed that she contracted COVID-19 over a year ago.  She has since gotten both COVID-19 vaccinations.  The patient does not have symptoms concerning for COVID-19 infection (fever, chills, cough, or new shortness of breath).    Past Medical History:  Diagnosis Date  . Anemia   . Atypical chest pain   . [place]    a. 11/2017 Carotid U/S: RICA <<38 LICA nl.  . Claustrophobia    NO MEDS  . Diastolic dysfunction    a. 11/2017 Echo: EF 60-65%, no rwma, Gr1 DD. Nl RV fxn.  . Family history of adverse reaction to anesthesia    1ST COUSIN-WAKE UP DURING SURGERY  . Liver cyst   . Ulcerative colitis ([place]   . Venous insufficiency    Past Surgical History:  Procedure Laterality Date  . BREAST BIOPSY Right 11/02/2011   ultrasound guided biopsy/clip-benign  . CHOLECYSTECTOMY N/A 07/29/2017   Procedure: LAPAROSCOPIC CHOLECYSTECTOMY;  Surgeon: PJules Husbands MD;  Location: [facility] ORS;  Service: General;  Laterality: N/A;  . COLON SURGERY  2014, 2015   DUE TO ULERATIVE COLITIS WITH COLOSTOMY  . COLOSTOMY TAKEDOWN    . FOOT SURGERY Bilateral    BONE SPURS   . MANDIBLE FRACTURE SURGERY  1991  . MOUTH SURGERY    . VENTRAL HERNIA REPAIR N/A 07/29/2017   Procedure: LAPAROSCOPIC VENTRAL HERNIA;  Surgeon: Jules Husbands, MD;  Location: [facility] ORS;  Service: General;  Laterality: N/A;      Current Meds  Medication Sig  . acetaminophen (TYLENOL) 325 MG tablet Take by mouth.  . ferrous sulfate 325 (65 FE) MG tablet Take 325 mg daily as needed by mouth.   . loperamide (IMODIUM) 2 MG capsule Take 2 mg as needed by mouth for diarrhea or loose stools (NORMALLY TAKES 2-3 TIMES DAILY SINCE HAVING HER COLON SURGERY).   . Multiple Vitamin (MULTIVITAMIN) tablet Take 1 tablet by mouth daily.     Allergies:   Caffeine, Chocolate, Chocolate flavor, Cocoa, and Meloxicam   Social History   Tobacco Use  . Smoking status: Former Smoker    Packs/day: 1.00    Years: 20.00    Pack years: 20.00    Types: Cigarettes    Quit date: 07/28/2005    Years since quitting: 14.4  . Smokeless tobacco: Never Used  Substance Use Topics  . Alcohol use: No    Comment: RARE WINE  . Drug use: No     Family Hx: The patient's family history includes Heart attack (age of onset: 54) in her mother; Hyperlipidemia in her brother. There is no history of Breast cancer.  ROS:   Please see the history of present illness.   All other systems reviewed and are negative.   Prior CV studies:   The following studies were reviewed today:  Carotid Doppler (12/16/2017): Less than 50% stenosis noted in the right common carotid artery.  Otherwise, no significant disease involving either internal carotid artery, left common carotid artery, or subclavian arteries.  Antegrade flow noted in both vertebral arteries.  TTE (12/16/2017): Normal LV size and wall thickness.  LVEF 60-65% with grade 1 diastolic dysfunction.  Normal RV size and function.  Mild tricuspid regurgitation.  Otherwise, no significant valvular abnormality.  Labs/Other Tests and Data Reviewed:    EKG:  No ECG reviewed.  Recent Labs: 08/07/2019: BUN 17; Creatinine, Ser 0.97; Hemoglobin 14.6; Platelets 235; Potassium 4.0; Sodium 141   Recent Lipid Panel Lab Results  Component Value Date/Time   CHOL 149 12/16/2017 08:13 AM   TRIG 54 12/16/2017  08:13 AM   HDL 75 12/16/2017 08:13 AM   CHOLHDL 2.0 12/16/2017 08:13 AM   LDLCALC 63 12/16/2017 08:13 AM    Wt Readings from Last 3 Encounters:  01/11/20 240 lb (108.9 kg)  08/07/19 260 lb (117.9 kg)  11/17/17 249 lb 8 oz (113.2 kg)     Objective:    Vital Signs:  BP 127/84   Pulse 80   Ht 5' 7.5" (1.715 m)   Wt 240 lb (108.9 kg)   BMI 37.03 kg/m    VITAL SIGNS:  reviewed  ASSESSMENT & PLAN:    Lower extremity edema: Chronic swelling (L>R) is most likely due to venous insufficiency.  We discussed referral to [facility] for second opinion regarding treatment but the patient declined at this time.  Advised her to work on leg elevation and sodium restriction.  Dizziness: No recent episodes.  No further workup/intervention at  this time.  Time:   Today, I have spent 12 minutes with the patient with telehealth technology discussing the above problems.     Medication Adjustments/Labs and Tests Ordered: Current medicines are reviewed at length with the patient today.  Concerns regarding medicines are outlined above.   Tests Ordered: None.  Medication Changes: None  Follow Up:  In Person in 6 month(s)  Signed, Nelva Bush, MD  01/11/2020 1:36 PM    [facility]

## 2020-01-11 NOTE — Telephone Encounter (Deleted)
Your [facility] team (Cardiologist [Heart Doctor] and Advanced Practice Provider [phone] Assistant; Nurse Practitioner]) has arranged for your next office appointment to be a virtual visit (also known as "Telehealth", "Telemedicine", "E-Visit").   We now offer virtual visits for all our patients.  This helps [place] to expand our ability to see patients in a timely and safe manner.  These visits are billed to your insurance just like traditional, in person, appointments.  Please review this IMPORTANT information about your upcoming appointment.   **PLEASE READ THE SECTION BELOW LABELED "CONSENT".**   **CALL OUR OFFICE WITH QUESTIONS.**   WHAT YOU NEED FOR YOUR VIRTUAL VISIT:  You will need a SmartPhone with microphone and video capability.  [If you are using MyChart to connect to your visit, it is also possible to use a desktop/laptop computer (with an Internet connection), as long as you have microphone and video capability.]  You will need to use Chrome, Edge or [employer] as your Wellsite geologist.  We highly recommend that you have a MyChart account as this will make connecting to your visit seamless.  A MyChart account not only allows you to connect to your provider for a virtual visit, but also allows you to see the results of all your tests, provider notes, medications and upcoming appointments.    A MyChart account is not absolutely necessary.  We can still complete your visit if you do not have one.    If you do not have a computer or SmartPhone with video/microphone capability or your Internet/cell service is weak on the day of your visit, we will do your visit by telephone.    A blood pressure cuff and scale are essential to collect your vital signs at home.  If you do not have these and you are unable to obtain them, please contact our office so that we can make arrangements for you.  If you have a pulse oximeter, Apple watch, Kardia mobile device, etc, you can collect data  from these devices as well to share with the provider for your visit.  These devices are not required for a virtual visit.    WHAT TO DO ON THE DAY OF YOUR APPOINTMENT: 30 minutes before your appointment:  Take your blood pressure, pulse or heart rate (if your blood pressure machine is able to collect it) and weight.  Write all these numbers down so you can give it to the nurse or medical assistant that calls.  Get all of the medications you currently take and put them where you will be sitting for the appointment.  The nurse/medical assistant will go over these with you when he/she calls.  15 minutes before your appointment:  You will receive a phone call from a nurse or medical assistant from our office.  The caller ID on your phone may indicate that the caller is either "[facility]" or "[place]".  However, the number may come across as spam.  Please turn off any spam blocker so you do not miss the call.  The nurse will:  Ask for your blood pressure, pulse, weight, height.  Go over all of your medications to make sure your chart is correct.  Review your allergies, smoking history, reason for appointment, etc.   Give you instructions on how to connect with the video platform or telephone.  IF YOUR VISIT IS BY TELEPHONE ONLY: After the nurse finishes getting you ready for the visit, your provider will call you on the phone  number you provide to [place].   TO CONNECT WITH YOUR PROVIDER FOR YOUR APPOINTMENT (BY VIDEO): You will either connect with the provider with your MyChart account or (if you are not using MyChart) with a link sent to your SmartPhone by text message.  If you are using MyChart, see below.  If you are not using MyChart, the nurse will send you the text message during the phone call.     If you are connecting with your MyChart account:   (The nurse that calls you will tell you when to do this):  You will log into your account. At the top of your home screen, you  should see the following prompt that tells you to "Begin your video visit with . . . ".  Click the green button (BEGIN VISIT).    The next screen will say "It's time to start your video visit!" Click the green button (BEGIN VIDEO VISIT)    There may be a screen that appears that asks for permission to use your camera and/or microphone.  Click ALLOW.  This will open the browser where your appointment will take place.   You may see the message: "Welcome.  Waiting for the call to begin."  Or, you will see that the nurse or your provider are already "in" the room waiting on you.   If you are connecting with a link sent to your SmartPhone via text: The nurse that calls you will send the link by text message. Click on the link (it should look something like this):     If asked to give permission to use the camera and or microphone, click ALLOW This will open the browser where your appointment will take place.      You may see the message: "Welcome.  Waiting for the call to begin."  Or, you will see that the nurse or your provider are already "in" the room waiting on you.    The controls for your visit look like the picture below.  Please note that this is what the microphone and camera look like when they are ON.  If muted, they will have a line through them.    After the appointment: Once your provider leaves the appointment, he/she will go over instructions with the nurse/medical assistant.  If needed, this person will call you with any instructions, appointments, etc.   A copy of your After Visit Summary (AVS) will be available later that day in your MyChart account.  This document will have all of your instructions, medications, appointments, etc.  If you are not using MyChart, we will mail it to your home.     *CONSENT FOR TELE-HEALTH VISIT - PLEASE REVIEW* By participating in the scheduled virtual visit (and any virtual visit scheduled within 365 days of the printing of this  document), I agree to the following:   I hereby voluntarily request, consent and authorize [facility] and its employed or contracted physicians, physician assistants, nurse practitioners or other licensed health care professionals (the Practitioner), to provide me with telemedicine health care services (the "Services") as deemed necessary by the treating Practitioner. I acknowledge and consent to receive the Services by the Practitioner via telemedicine. I understand that the telemedicine visit will involve communicating with the Practitioner through live audiovisual communication technology and the disclosure of certain medical information by electronic transmission. I acknowledge that I have been given the opportunity to request an in-person assessment or other available alternative prior to the telemedicine visit  and am voluntarily participating in the telemedicine visit.  I understand that I have the right to withhold or withdraw my consent to the use of telemedicine in the course of my care at any time, without affecting my right to future care or treatment, and that the Practitioner or I may terminate the telemedicine visit at any time. I understand that I have the right to inspect all information obtained and/or recorded in the course of the telemedicine visit and may receive copies of available information for a reasonable fee.  I understand that some of the potential risks of receiving the Services via telemedicine include:  Marland Kitchen Delay or interruption in medical evaluation due to technological equipment failure or disruption; . Information transmitted may not be sufficient (e.g. poor resolution of images) to allow for appropriate medical decision making by the Practitioner; and/or  . In rare instances, security protocols could fail, causing a breach of personal health information.  Furthermore, I acknowledge that it is my responsibility to provide information about my medical history, conditions  and care that is complete and accurate to the best of my ability. I acknowledge that Practitioner's advice, recommendations, and/or decision may be based on factors not within their control, such as incomplete or inaccurate data provided by me or distortions of diagnostic images or specimens that may result from electronic transmissions. I understand that the practice of medicine is not an exact science and that Practitioner makes no warranties or guarantees regarding treatment outcomes. I acknowledge that a copy of this consent can be made available to me via my patient portal ([place]), or I can request a printed copy by calling the office of [place].    I understand that my insurance will be billed for this visit.   I have read or had this consent read to me. . I understand the contents of this consent, which adequately explains the benefits and risks of the Services being provided via telemedicine.  . I have been provided ample opportunity to ask questions regarding this consent and the Services and have had my questions answered to my satisfaction. . I give my informed consent for the services to be provided through the use of telemedicine in my medical care

## 2020-01-11 NOTE — Telephone Encounter (Signed)
Pt c/o medication issue:  1. Name of Medication: meclizine 25  2. How are you currently taking this medication (dosage and times per day)? na  3. Are you having a reaction (difficulty breathing--STAT)? n/a  4. What is your medication issue? Patient noticed that she was marked allergic to the medication and she is not. Patient is allergic to Meloxicam   Patient states this was done from prior ED visit and unsure if our office can make this fix or not

## 2020-01-11 NOTE — Telephone Encounter (Signed)
Routing to Dr End to make sure it is ok to update her chart accordingly.

## 2020-03-18 ENCOUNTER — Telehealth: Payer: Self-pay | Admitting: Internal Medicine

## 2020-03-18 NOTE — Telephone Encounter (Signed)
Patient calling  Would like to know if Dr End is able to mail out colon tests If so, she would like another for [name] Badgett Please call to discuss

## 2020-03-19 NOTE — Telephone Encounter (Signed)
I spoke with the patient. I advised her that we do no do colon testing and that this would need to come from her PCP.  The patient voices understanding and states she has reached out to her PCP as well.

## 2020-04-23 ENCOUNTER — Other Ambulatory Visit (INDEPENDENT_AMBULATORY_CARE_PROVIDER_SITE_OTHER): Payer: Self-pay | Admitting: Nurse Practitioner

## 2020-04-23 DIAGNOSIS — I83899 Varicose veins of unspecified lower extremities with other complications: Secondary | ICD-10-CM

## 2020-04-24 ENCOUNTER — Ambulatory Visit (INDEPENDENT_AMBULATORY_CARE_PROVIDER_SITE_OTHER): Payer: Federal, State, Local not specified - PPO | Admitting: Nurse Practitioner

## 2020-04-24 ENCOUNTER — Ambulatory Visit (INDEPENDENT_AMBULATORY_CARE_PROVIDER_SITE_OTHER): Payer: Federal, State, Local not specified - PPO

## 2020-04-24 ENCOUNTER — Other Ambulatory Visit: Payer: Self-pay

## 2020-04-24 ENCOUNTER — Encounter (INDEPENDENT_AMBULATORY_CARE_PROVIDER_SITE_OTHER): Payer: Self-pay | Admitting: Nurse Practitioner

## 2020-04-24 VITALS — BP 137/83 | HR 81 | Resp 16 | Ht 67.0 in | Wt 269.4 lb

## 2020-04-24 DIAGNOSIS — I83813 Varicose veins of bilateral lower extremities with pain: Secondary | ICD-10-CM | POA: Diagnosis not present

## 2020-04-24 DIAGNOSIS — I83899 Varicose veins of unspecified lower extremities with other complications: Secondary | ICD-10-CM

## 2020-04-24 DIAGNOSIS — K51919 Ulcerative colitis, unspecified with unspecified complications: Secondary | ICD-10-CM | POA: Diagnosis not present

## 2020-04-24 NOTE — Patient Instructions (Signed)
Nonsurgical Procedures for Varicose Veins Various nonsurgical procedures can be used to treat varicose veins. Varicose veins are swollen, twisted veins that are visible under the skin. They occur most often in the legs. These veins may appear blue and bulging. Varicose veins are caused by damage to the valves in veins. All veins have a valve that makes blood flow in only one direction. If a valve gets weak or damaged, blood can pool and cause varicose veins. You may need a procedure to treat your varicose veins if they are causing symptoms or complications, or if lifestyle changes have not helped. These procedures can reduce pain, aching, and the risk of bleeding and blood clots. They can also improve the way the affected area looks (cosmetic appearance). The three common nonsurgical procedures are:  Sclerotherapy. A chemical is injected to close off a vein.  Laser treatment. Light energy is applied to close off the vein.  Radiofrequency vein ablation. Electrical energy is used to produce heat that closes off the vein. Your health care provider will discuss the method that is best for you based on your condition. Tell a health care provider about:  Any allergies you have.  All medicines you are taking, including vitamins, herbs, eye drops, creams, and over-the-counter medicines.  Any problems you or family members have had with anesthetic medicines.  Any blood disorders you have.  Any surgeries you have had.  Any medical conditions you have.  Whether you are pregnant or may be pregnant. What are the risks? Generally, this is a safe procedure. However, problems may occur, including:  Damage to nearby nerves, tissues, or veins.  Skin irritation, sores, or dark spots.  Numbness.  Clotting.  Infection.  Allergic reactions to medicines.  Scarring.  Leg swelling.  Need for additional treatments.  Bruising. What happens before the procedure?  Ask your health care provider  about: ? Changing or stopping your regular medicines. This is especially important if you are taking diabetes medicines or blood thinners. ? Taking over-the-counter medicines, vitamins, herbs, and supplements. ? Taking medicines such as aspirin and ibuprofen. These medicines can thin your blood. Do not take these medicines unless your health care provider tells you to take them.  You may have an exam or testing. This can include a tests to: ? Check for clots and check blood flow using sound waves (Doppler ultrasound). ? Observe how blood flows through your veins by injecting a dye that outlines your veins on X-rays (angiogram). This test is used in rare cases. What happens during the procedure? One of the following procedures will be performed: Sclerotherapy This procedure is often used for small to medium veins.  A chemical (sclerosant) that irritates the lining of the vein will be injected into the vein. This will cause the varicose vein to be closed off. Sclerosants in different amounts and strengths can be used, depending on the size and location of the vein.  All of the varicose vein sites will be injected. You may need more than one treatment because new varicose veins may develop, or more than one injection may be needed for each varicose vein.  Laser treatment There are two ways that lasers are used to treat varicose veins:  Light energy from a laser may be directed onto the vein through the skin.  A needle may be used to pass a thin laser catheter into the vein to cause it to close. You may need more than one treatment if the vein re-opens. In some cases,  laser treatment may be combined with sclerotherapy. Radiofrequency vein ablation   You will be given a medicine that numbs the area (local anesthetic).  A small incision will be made near the varicose vein.  A thin tube (catheter) will be threaded into your vein.  The tip of the catheter will deploy electrodes.  The  electrodes will deliver electrical energy to produce heat that closes off the vein. What happens after the procedure?  A bandage (dressing) may be used to cover the injection site or incisions.  You may have to wear compression stockings. These stockings help to prevent blood clots and reduce swelling in your legs.  Return to your normal activities as told by your health care provider. Summary  Varicose veins are swollen, twisted veins that are visible under the skin. They occur most often in the legs.  Various procedures can be used to treat varicose veins. You may need a procedure to treat your varicose veins if they are causing symptoms or complications, or if lifestyle changes have not helped.  Your health care provider will discuss the method that is best for you based on your condition. This information is not intended to replace advice given to you by your health care provider. Make sure you discuss any questions you have with your health care provider. Document Revised: 12/30/2018 Document Reviewed: 12/18/2016 Elsevier Patient Education  [place].  Nonsurgical Procedures for Varicose Veins, Care After This sheet gives you information about how to care for yourself after your procedure. Your health care provider may also give you more specific instructions. If you have problems or questions, contact your health care provider. What can I expect after the procedure? After the procedure, it is common to have:  Swelling.  Bruising.  Soreness.  Mild skin discoloration.  Slight bleeding at the incision sites. Follow these instructions at home: Incision or puncture site care  Follow instructions from your health care provider about how to take care of your incision or puncture site. Make sure you: ? Wash your hands with soap and water before you change your bandage (dressing). If soap and water are not available, use hand sanitizer. ? Change your dressing as told by  your health care provider. ? Leave skin glue or adhesive strips in place. These skin closures may need to stay in place for 2 weeks or longer. If adhesive strip edges start to loosen and curl up, you may trim the loose edges. Do not remove adhesive strips completely unless your health care provider tells you to do that.  Check your incision or puncture area every day for signs of infection. Check for: ? Redness, swelling, or pain. ? Fluid or blood. ? Warmth. ? Pus or a bad smell. General instructions   Take over-the-counter and prescription medicines only as told by your health care provider.  Wear compression stockings as told by your health care provider. These stockings help to prevent blood clots and reduce swelling in your legs.  Do not take baths, swim, or use a hot tub until your health care provider approves. Ask your health care provider if you can take showers.  Wear loose-fitting clothing.  Return to your normal activities as told by your health care provider. Ask your health care provider what activities are safe for you.  Get regular daily exercise. Walk or ride a stationary bike daily or as told by your health care provider.  Keep all follow-up visits as told by your health care provider. This is  important. Contact a health care provider if:  You have a fever.  You have redness, swelling, or pain around your incision or puncture site.  You have fluid or blood coming from your incision or puncture site.  Your incision or puncture site feels warm to the touch.  You have pus or a bad smell coming from your incision or puncture site.  You develop a cough. Get help right away if:  You pass out.  You have very bad pain in your leg.  You have leg pain that gets worse when you walk.  You have redness or swelling in your leg that is getting worse.  You have trouble breathing.  You cough up blood. Summary  After the procedure, it is common to have swelling,  bruising, soreness, or mild skin discoloration.  Follow instructions from your health care provider about how to take care of your incision or puncture site.  Wear compression stockings as told by your health care provider. These stockings help to prevent blood clots and reduce swelling in your legs. This information is not intended to replace advice given to you by your health care provider. Make sure you discuss any questions you have with your health care provider. Document Revised: 08/20/2017 Document Reviewed: 12/18/2016 Elsevier Patient Education  [place].

## 2020-04-24 NOTE — Progress Notes (Signed)
Subjective:    Patient ID: Haley Deleon, female    DOB: January 04, 1956, 64 y.o.   MRN: [id number] Chief Complaint  Patient presents with  . New Patient (Initial Visit)    ref Leyva spider veins w/pain    The patient is seen for evaluation of symptomatic varicose veins. The patient relates burning and stinging which worsened steadily throughout the course of the day, particularly with standing. The patient also notes an aching and throbbing pain over the varicosities, particularly with prolonged dependent positions. The symptoms are significantly improved with elevation.  The patient also notes that during hot weather the symptoms are greatly intensified. The patient states the pain from the varicose veins interferes with work, daily exercise, shopping and household maintenance. At this point, the symptoms are persistent and severe enough that they're having a negative impact on lifestyle and are interfering with daily activities.  There is no history of DVT or pulmonary embolism.  However the patient has had a superficial thrombophlebitis in the left lower extremity last year. There is no history of ulceration or hemorrhage. The patient endorses having a family history of varicose veins.   The patient has worn graduated compression in the past. At the present time the patient has been using over-the-counter analgesics. There is prior history of sclerotherapy.  Today noninvasive studies show no evidence of DVT or superficial thrombophlebitis bilaterally.  There is evidence of deep venous insufficiency in the right lower extremity.  The bilateral lower extremities have evidence of venous reflux.  The left lower extremity has evidence of reflux in the great saphenous vein at the proximal thigh extending to the knee.  These vein diameters range from 0.75-1.11.  The patient also has evidence of reflux in the left small saphenous vein with a vein diameter of 0.52 cm.  The right lower extremity has  evidence of reflux at the great saphenous vein and the saphenofemoral junction that is not present in the proximal or mid thigh but beginning at the distal thigh to the proximal calf there is evidence of venous reflux.  The vein diameters range from 0.52 cm to 0.60 cm   Review of Systems  Cardiovascular: Positive for leg swelling.  Skin:       Scattered large varicosities 4 to 5 mm in size  All other systems reviewed and are negative.      Objective:   Physical Exam Vitals reviewed.  HENT:     Head: Normocephalic.  Cardiovascular:     Rate and Rhythm: Normal rate and regular rhythm.     Pulses: Normal pulses.  Pulmonary:     Effort: Pulmonary effort is normal.  Musculoskeletal:        General: Normal range of motion.  Skin:    General: Skin is warm and dry.  Neurological:     Mental Status: She is alert and oriented to person, place, and time.  Psychiatric:        Mood and Affect: Mood normal.        Behavior: Behavior normal.        Thought Content: Thought content normal.        Judgment: Judgment normal.     BP 137/83 (BP Location: Right Arm)   Pulse 81   Resp 16   Ht 5' 7"  (1.702 m)   Wt 269 lb 6.4 oz (122.2 kg)   BMI 42.19 kg/m   Past Medical History:  Diagnosis Date  . Anemia   . Atypical chest pain   .  Carotid arterial disease ([place])    a. 11/2017 Carotid U/S: RICA <78, LICA nl.  . Claustrophobia    NO MEDS  . Diastolic dysfunction    a. 11/2017 Echo: EF 60-65%, no rwma, Gr1 DD. Nl RV fxn.  . Family history of adverse reaction to anesthesia    1ST COUSIN-WAKE UP DURING SURGERY  . Liver cyst   . Ulcerative colitis ([place])   . Venous insufficiency     Social History   Socioeconomic History  . Marital status: Divorced    Spouse name: Not on file  . Number of children: Not on file  . Years of education: Not on file  . Highest education level: Not on file  Occupational History  . Not on file  Tobacco Use  . Smoking status: Former Smoker     Packs/day: 1.00    Years: 20.00    Pack years: 20.00    Types: Cigarettes    Quit date: 07/28/2005    Years since quitting: 14.7  . Smokeless tobacco: Never Used  Vaping Use  . Vaping Use: Never used  Substance and Sexual Activity  . Alcohol use: No    Comment: RARE WINE  . Drug use: No  . Sexual activity: Not on file  Other Topics Concern  . Not on file  Social History Narrative  . Not on file   Social Determinants of Health   Financial Resource Strain:   . Difficulty of Paying Living Expenses:   Food Insecurity:   . Worried About Charity fundraiser in the Last Year:   . Arboriculturist in the Last Year:   Transportation Needs:   . Film/video editor (Medical):   Marland Kitchen Lack of Transportation (Non-Medical):   Physical Activity:   . Days of Exercise per Week:   . Minutes of Exercise per Session:   Stress:   . Feeling of Stress :   Social Connections:   . Frequency of Communication with Friends and Family:   . Frequency of Social Gatherings with Friends and Family:   . Attends Religious Services:   . Active Member of Clubs or Organizations:   . Attends Archivist Meetings:   Marland Kitchen Marital Status:   Intimate Partner Violence:   . Fear of Current or Ex-Partner:   . Emotionally Abused:   Marland Kitchen Physically Abused:   . Sexually Abused:     Past Surgical History:  Procedure Laterality Date  . BREAST BIOPSY Right 11/02/2011   ultrasound guided biopsy/clip-benign  . CHOLECYSTECTOMY N/A 07/29/2017   Procedure: LAPAROSCOPIC CHOLECYSTECTOMY;  Surgeon: Jules Husbands, MD;  Location: [facility] ORS;  Service: General;  Laterality: N/A;  . COLON SURGERY  2014, 2015   DUE TO ULERATIVE COLITIS WITH COLOSTOMY  . COLOSTOMY TAKEDOWN    . FOOT SURGERY Bilateral    BONE SPURS   . MANDIBLE FRACTURE SURGERY  1991  . MOUTH SURGERY    . VENTRAL HERNIA REPAIR N/A 07/29/2017   Procedure: LAPAROSCOPIC VENTRAL HERNIA;  Surgeon: Jules Husbands, MD;  Location: [facility] ORS;  Service: General;   Laterality: N/A;    Family History  Problem Relation Age of Onset  . Heart attack Mother 67  . Hyperlipidemia Brother   . Breast cancer Neg Hx     Allergies  Allergen Reactions  . Caffeine Anaphylaxis  . Chocolate Anaphylaxis  . Chocolate Flavor Anaphylaxis  . Cocoa Anaphylaxis  . Meloxicam        Assessment & Plan:  1. Varicose veins of both lower extremities with pain Recommend  I have reviewed my previous  discussion with the patient regarding  varicose veins and why they cause symptoms. Patient will continue  wearing graduated compression stockings class 1 on a daily basis, beginning first thing in the morning and removing them in the evening.    In addition, behavioral modification including elevation during the day was again discussed and this will continue.  The patient has utilized over the counter pain medications and has been exercising.  However, at this time conservative therapy has not alleviated the patient's symptoms of leg pain and swelling  Recommend: laser ablation of the left great saphenous vein and small saphenous vein followed by right great saphenous veins to eliminate the symptoms of pain and swelling of the lower extremities caused by the severe superficial venous reflux disease.   2. Ulcerative colitis with complication, unspecified location [facility]) Due to previous colon  surgical interventions, it may be best to have the patient scheduled early in the morning to prevent any complications or delays.   Current Outpatient Medications on File Prior to Visit  Medication Sig Dispense Refill  . acetaminophen (TYLENOL) 325 MG tablet Take by mouth.    . ferrous sulfate 325 (65 FE) MG tablet Take 325 mg daily as needed by mouth.     . loperamide (IMODIUM) 2 MG capsule Take 2 mg as needed by mouth for diarrhea or loose stools (NORMALLY TAKES 2-3 TIMES DAILY SINCE HAVING HER COLON SURGERY).     . Multiple Vitamin (MULTIVITAMIN) tablet Take 1 tablet by mouth  daily.     No current facility-administered medications on file prior to visit.    Patient Instructions  Nonsurgical Procedures for Varicose Veins Various nonsurgical procedures can be used to treat varicose veins. Varicose veins are swollen, twisted veins that are visible under the skin. They occur most often in the legs. These veins may appear blue and bulging. Varicose veins are caused by damage to the valves in veins. All veins have a valve that makes blood flow in only one direction. If a valve gets weak or damaged, blood can pool and cause varicose veins. You may need a procedure to treat your varicose veins if they are causing symptoms or complications, or if lifestyle changes have not helped. These procedures can reduce pain, aching, and the risk of bleeding and blood clots. They can also improve the way the affected area looks (cosmetic appearance). The three common nonsurgical procedures are:  Sclerotherapy. A chemical is injected to close off a vein.  Laser treatment. Light energy is applied to close off the vein.  Radiofrequency vein ablation. Electrical energy is used to produce heat that closes off the vein. Your health care provider will discuss the method that is best for you based on your condition. Tell a health care provider about:  Any allergies you have.  All medicines you are taking, including vitamins, herbs, eye drops, creams, and over-the-counter medicines.  Any problems you or family members have had with anesthetic medicines.  Any blood disorders you have.  Any surgeries you have had.  Any medical conditions you have.  Whether you are pregnant or may be pregnant. What are the risks? Generally, this is a safe procedure. However, problems may occur, including:  Damage to nearby nerves, tissues, or veins.  Skin irritation, sores, or dark spots.  Numbness.  Clotting.  Infection.  Allergic reactions to medicines.  Scarring.  Leg  swelling.  Need for  additional treatments.  Bruising. What happens before the procedure?  Ask your health care provider about: ? Changing or stopping your regular medicines. This is especially important if you are taking diabetes medicines or blood thinners. ? Taking over-the-counter medicines, vitamins, herbs, and supplements. ? Taking medicines such as aspirin and ibuprofen. These medicines can thin your blood. Do not take these medicines unless your health care provider tells you to take them.  You may have an exam or testing. This can include a tests to: ? Check for clots and check blood flow using sound waves (Doppler ultrasound). ? Observe how blood flows through your veins by injecting a dye that outlines your veins on X-rays (angiogram). This test is used in rare cases. What happens during the procedure? One of the following procedures will be performed: Sclerotherapy This procedure is often used for small to medium veins.  A chemical (sclerosant) that irritates the lining of the vein will be injected into the vein. This will cause the varicose vein to be closed off. Sclerosants in different amounts and strengths can be used, depending on the size and location of the vein.  All of the varicose vein sites will be injected. You may need more than one treatment because new varicose veins may develop, or more than one injection may be needed for each varicose vein.  Laser treatment There are two ways that lasers are used to treat varicose veins:  Light energy from a laser may be directed onto the vein through the skin.  A needle may be used to pass a thin laser catheter into the vein to cause it to close. You may need more than one treatment if the vein re-opens. In some cases, laser treatment may be combined with sclerotherapy. Radiofrequency vein ablation   You will be given a medicine that numbs the area (local anesthetic).  A small incision will be made near the varicose  vein.  A thin tube (catheter) will be threaded into your vein.  The tip of the catheter will deploy electrodes.  The electrodes will deliver electrical energy to produce heat that closes off the vein. What happens after the procedure?  A bandage (dressing) may be used to cover the injection site or incisions.  You may have to wear compression stockings. These stockings help to prevent blood clots and reduce swelling in your legs.  Return to your normal activities as told by your health care provider. Summary  Varicose veins are swollen, twisted veins that are visible under the skin. They occur most often in the legs.  Various procedures can be used to treat varicose veins. You may need a procedure to treat your varicose veins if they are causing symptoms or complications, or if lifestyle changes have not helped.  Your health care provider will discuss the method that is best for you based on your condition. This information is not intended to replace advice given to you by your health care provider. Make sure you discuss any questions you have with your health care provider. Document Revised: 12/30/2018 Document Reviewed: 12/18/2016 Elsevier Patient Education  [place].  Nonsurgical Procedures for Varicose Veins, Care After This sheet gives you information about how to care for yourself after your procedure. Your health care provider may also give you more specific instructions. If you have problems or questions, contact your health care provider. What can I expect after the procedure? After the procedure, it is common to have:  Swelling.  Bruising.  Soreness.  Mild  skin discoloration.  Slight bleeding at the incision sites. Follow these instructions at home: Incision or puncture site care  Follow instructions from your health care provider about how to take care of your incision or puncture site. Make sure you: ? Wash your hands with soap and water before you  change your bandage (dressing). If soap and water are not available, use hand sanitizer. ? Change your dressing as told by your health care provider. ? Leave skin glue or adhesive strips in place. These skin closures may need to stay in place for 2 weeks or longer. If adhesive strip edges start to loosen and curl up, you may trim the loose edges. Do not remove adhesive strips completely unless your health care provider tells you to do that.  Check your incision or puncture area every day for signs of infection. Check for: ? Redness, swelling, or pain. ? Fluid or blood. ? Warmth. ? Pus or a bad smell. General instructions   Take over-the-counter and prescription medicines only as told by your health care provider.  Wear compression stockings as told by your health care provider. These stockings help to prevent blood clots and reduce swelling in your legs.  Do not take baths, swim, or use a hot tub until your health care provider approves. Ask your health care provider if you can take showers.  Wear loose-fitting clothing.  Return to your normal activities as told by your health care provider. Ask your health care provider what activities are safe for you.  Get regular daily exercise. Walk or ride a stationary bike daily or as told by your health care provider.  Keep all follow-up visits as told by your health care provider. This is important. Contact a health care provider if:  You have a fever.  You have redness, swelling, or pain around your incision or puncture site.  You have fluid or blood coming from your incision or puncture site.  Your incision or puncture site feels warm to the touch.  You have pus or a bad smell coming from your incision or puncture site.  You develop a cough. Get help right away if:  You pass out.  You have very bad pain in your leg.  You have leg pain that gets worse when you walk.  You have redness or swelling in your leg that is getting  worse.  You have trouble breathing.  You cough up blood. Summary  After the procedure, it is common to have swelling, bruising, soreness, or mild skin discoloration.  Follow instructions from your health care provider about how to take care of your incision or puncture site.  Wear compression stockings as told by your health care provider. These stockings help to prevent blood clots and reduce swelling in your legs. This information is not intended to replace advice given to you by your health care provider. Make sure you discuss any questions you have with your health care provider. Document Revised: 08/20/2017 Document Reviewed: 12/18/2016 Elsevier Patient Education  [place].   No follow-ups on file.   Kris Hartmann, NP

## 2020-06-13 ENCOUNTER — Telehealth (INDEPENDENT_AMBULATORY_CARE_PROVIDER_SITE_OTHER): Payer: Self-pay

## 2020-06-13 ENCOUNTER — Other Ambulatory Visit (INDEPENDENT_AMBULATORY_CARE_PROVIDER_SITE_OTHER): Payer: Federal, State, Local not specified - PPO | Admitting: Vascular Surgery

## 2020-06-13 NOTE — Telephone Encounter (Signed)
Pt called and left a message on the nurses line I returned the pt's call an left a VM and asked that she return my call so I could help her with what ever she needed.

## 2020-06-14 ENCOUNTER — Ambulatory Visit (INDEPENDENT_AMBULATORY_CARE_PROVIDER_SITE_OTHER): Payer: Federal, State, Local not specified - PPO | Admitting: Vascular Surgery

## 2020-06-14 ENCOUNTER — Other Ambulatory Visit (INDEPENDENT_AMBULATORY_CARE_PROVIDER_SITE_OTHER): Payer: Self-pay | Admitting: Vascular Surgery

## 2020-06-14 ENCOUNTER — Other Ambulatory Visit: Payer: Self-pay

## 2020-06-14 DIAGNOSIS — I83812 Varicose veins of left lower extremities with pain: Secondary | ICD-10-CM

## 2020-06-14 NOTE — Progress Notes (Signed)
  Haley Deleon is a 64 y.o. female who presents with symptomatic venous reflux  Past Medical History:  Diagnosis Date  . Anemia   . Atypical chest pain   . Carotid arterial disease ([place])    a. 11/2017 Carotid U/S: RICA <88, LICA nl.  . Claustrophobia    NO MEDS  . Diastolic dysfunction    a. 11/2017 Echo: EF 60-65%, no rwma, Gr1 DD. Nl RV fxn.  . Family history of adverse reaction to anesthesia    1ST COUSIN-WAKE UP DURING SURGERY  . Liver cyst   . Ulcerative colitis ([place])   . Venous insufficiency     Past Surgical History:  Procedure Laterality Date  . BREAST BIOPSY Right 11/02/2011   ultrasound guided biopsy/clip-benign  . CHOLECYSTECTOMY N/A 07/29/2017   Procedure: LAPAROSCOPIC CHOLECYSTECTOMY;  Surgeon: Jules Husbands, MD;  Location: [facility] ORS;  Service: General;  Laterality: N/A;  . COLON SURGERY  2014, 2015   DUE TO ULERATIVE COLITIS WITH COLOSTOMY  . COLOSTOMY TAKEDOWN    . FOOT SURGERY Bilateral    BONE SPURS   . MANDIBLE FRACTURE SURGERY  1991  . MOUTH SURGERY    . VENTRAL HERNIA REPAIR N/A 07/29/2017   Procedure: LAPAROSCOPIC VENTRAL HERNIA;  Surgeon: Jules Husbands, MD;  Location: [facility] ORS;  Service: General;  Laterality: N/A;     Current Outpatient Medications:  .  acetaminophen (TYLENOL) 325 MG tablet, Take by mouth., Disp: , Rfl:  .  loperamide (IMODIUM) 2 MG capsule, Take 2 mg as needed by mouth for diarrhea or loose stools (NORMALLY TAKES 2-3 TIMES DAILY SINCE HAVING HER COLON SURGERY). , Disp: , Rfl:  .  Multiple Vitamin (MULTIVITAMIN) tablet, Take 1 tablet by mouth daily., Disp: , Rfl:  .  ALPRAZolam (XANAX) 0.5 MG tablet, Take by mouth., Disp: , Rfl:  .  ferrous sulfate 325 (65 FE) MG tablet, Take 325 mg daily as needed by mouth.  (Patient not taking: Reported on 06/14/2020), Disp: , Rfl:   Allergies  Allergen Reactions  . Caffeine Anaphylaxis  . Chocolate Anaphylaxis  . Chocolate Flavor Anaphylaxis  . Cocoa Anaphylaxis  . Meloxicam      Varicose  veins of leg with pain, left     PLAN: The patient's left lower extremity was sterilely prepped and draped. The ultrasound machine was used to visualize the saphenous vein throughout its course. A segment in the upper calf was selected for access. The saphenous vein was accessed without difficulty using ultrasound guidance with a micropuncture needle. A 0.018 wire was then placed beyond the saphenofemoral junction and the needle was removed. The 65 cm sheath was then placed over the wire and the wire and dilator were removed. The laser fiber was then placed through the sheath and its tip was placed approximately 5 centimeters below the saphenofemoral junction. Tumescent anesthesia was then created with a dilute lidocaine solution. Laser energy was then delivered with constant withdrawal of the sheath and laser fiber. Approximately 1266 joules of energy were delivered over a length of 33 centimeters using a 1470 Hz VenaCure machine at 7 W. Sterile dressings were placed. The patient tolerated the procedure well without obvious complications.   Follow-up in 1 week with post-laser duplex.

## 2020-06-17 ENCOUNTER — Other Ambulatory Visit: Payer: Self-pay

## 2020-06-17 ENCOUNTER — Ambulatory Visit (INDEPENDENT_AMBULATORY_CARE_PROVIDER_SITE_OTHER): Payer: Federal, State, Local not specified - PPO

## 2020-06-17 DIAGNOSIS — I83812 Varicose veins of left lower extremities with pain: Secondary | ICD-10-CM

## 2020-06-27 ENCOUNTER — Other Ambulatory Visit (INDEPENDENT_AMBULATORY_CARE_PROVIDER_SITE_OTHER): Payer: Federal, State, Local not specified - PPO | Admitting: Vascular Surgery

## 2020-07-01 ENCOUNTER — Encounter (INDEPENDENT_AMBULATORY_CARE_PROVIDER_SITE_OTHER): Payer: Federal, State, Local not specified - PPO

## 2020-07-04 ENCOUNTER — Ambulatory Visit: Payer: Federal, State, Local not specified - PPO | Admitting: Gastroenterology

## 2020-07-09 ENCOUNTER — Encounter (INDEPENDENT_AMBULATORY_CARE_PROVIDER_SITE_OTHER): Payer: Self-pay | Admitting: Vascular Surgery

## 2020-07-09 ENCOUNTER — Ambulatory Visit (INDEPENDENT_AMBULATORY_CARE_PROVIDER_SITE_OTHER): Payer: Federal, State, Local not specified - PPO | Admitting: Vascular Surgery

## 2020-07-09 ENCOUNTER — Other Ambulatory Visit: Payer: Self-pay

## 2020-07-09 DIAGNOSIS — I83811 Varicose veins of right lower extremities with pain: Secondary | ICD-10-CM | POA: Diagnosis not present

## 2020-07-09 DIAGNOSIS — I83813 Varicose veins of bilateral lower extremities with pain: Secondary | ICD-10-CM | POA: Diagnosis not present

## 2020-07-09 NOTE — Progress Notes (Signed)
  Haley Deleon is a 63 y.o. female who presents with symptomatic venous reflux  Past Medical History:  Diagnosis Date  . Anemia   . Atypical chest pain   . Carotid arterial disease ([place])    a. 11/2017 Carotid U/S: RICA <12, LICA nl.  . Claustrophobia    NO MEDS  . Diastolic dysfunction    a. 11/2017 Echo: EF 60-65%, no rwma, Gr1 DD. Nl RV fxn.  . Family history of adverse reaction to anesthesia    1ST COUSIN-WAKE UP DURING SURGERY  . Liver cyst   . Ulcerative colitis ([place])   . Venous insufficiency     Past Surgical History:  Procedure Laterality Date  . BREAST BIOPSY Right 11/02/2011   ultrasound guided biopsy/clip-benign  . CHOLECYSTECTOMY N/A 07/29/2017   Procedure: LAPAROSCOPIC CHOLECYSTECTOMY;  Surgeon: Jules Husbands, MD;  Location: [facility] ORS;  Service: General;  Laterality: N/A;  . COLON SURGERY  2014, 2015   DUE TO ULERATIVE COLITIS WITH COLOSTOMY  . COLOSTOMY TAKEDOWN    . FOOT SURGERY Bilateral    BONE SPURS   . MANDIBLE FRACTURE SURGERY  1991  . MOUTH SURGERY    . VENTRAL HERNIA REPAIR N/A 07/29/2017   Procedure: LAPAROSCOPIC VENTRAL HERNIA;  Surgeon: Jules Husbands, MD;  Location: [facility] ORS;  Service: General;  Laterality: N/A;     Current Outpatient Medications:  .  acetaminophen (TYLENOL) 325 MG tablet, Take by mouth., Disp: , Rfl:  .  ALPRAZolam (XANAX) 0.5 MG tablet, Take by mouth., Disp: , Rfl:  .  ferrous sulfate 325 (65 FE) MG tablet, Take 325 mg by mouth daily as needed. , Disp: , Rfl:  .  loperamide (IMODIUM) 2 MG capsule, Take 2 mg as needed by mouth for diarrhea or loose stools (NORMALLY TAKES 2-3 TIMES DAILY SINCE HAVING HER COLON SURGERY). , Disp: , Rfl:  .  Multiple Vitamin (MULTIVITAMIN) tablet, Take 1 tablet by mouth daily., Disp: , Rfl:   Allergies  Allergen Reactions  . Caffeine Anaphylaxis  . Chocolate Anaphylaxis  . Chocolate Flavor Anaphylaxis  . Cocoa Anaphylaxis  . Meloxicam      Varicose veins of lower extremity with pain,  bilateral     PLAN: The patient's right lower extremity was sterilely prepped and draped. The ultrasound machine was used to visualize the saphenous vein throughout its course. A segment in the mid to upper calf was selected for access. The saphenous vein was accessed without difficulty using ultrasound guidance with a micropuncture needle. A 0.018 wire was then placed beyond the saphenofemoral junction and the needle was removed. The 65 cm sheath was then placed over the wire and the wire and dilator were removed. The laser fiber was then placed through the sheath and its tip was placed approximately 5 centimeters below the saphenofemoral junction. Tumescent anesthesia was then created with a dilute lidocaine solution. Laser energy was then delivered with constant withdrawal of the sheath and laser fiber. Approximately 1469 joules of energy were delivered over a length of 39 centimeters using a 1470 Hz VenaCure machine at 7 W. Sterile dressings were placed. The patient tolerated the procedure well without obvious complications.   Follow-up in 1 week with post-laser duplex.

## 2020-07-12 ENCOUNTER — Ambulatory Visit (INDEPENDENT_AMBULATORY_CARE_PROVIDER_SITE_OTHER): Payer: Federal, State, Local not specified - PPO

## 2020-07-12 ENCOUNTER — Other Ambulatory Visit: Payer: Self-pay

## 2020-07-12 DIAGNOSIS — I83813 Varicose veins of bilateral lower extremities with pain: Secondary | ICD-10-CM | POA: Diagnosis not present

## 2020-07-17 ENCOUNTER — Ambulatory Visit: Payer: Federal, State, Local not specified - PPO | Admitting: Internal Medicine

## 2020-08-06 ENCOUNTER — Ambulatory Visit (INDEPENDENT_AMBULATORY_CARE_PROVIDER_SITE_OTHER): Payer: Federal, State, Local not specified - PPO | Admitting: Vascular Surgery

## 2020-08-09 ENCOUNTER — Ambulatory Visit (INDEPENDENT_AMBULATORY_CARE_PROVIDER_SITE_OTHER): Payer: Federal, State, Local not specified - PPO | Admitting: Vascular Surgery

## 2020-08-23 ENCOUNTER — Ambulatory Visit (INDEPENDENT_AMBULATORY_CARE_PROVIDER_SITE_OTHER): Payer: Federal, State, Local not specified - PPO | Admitting: Internal Medicine

## 2020-08-23 ENCOUNTER — Encounter: Payer: Self-pay | Admitting: Internal Medicine

## 2020-08-23 VITALS — BP 130/84 | HR 94 | Ht 67.0 in | Wt 249.0 lb

## 2020-08-23 DIAGNOSIS — M7989 Other specified soft tissue disorders: Secondary | ICD-10-CM | POA: Diagnosis not present

## 2020-08-23 NOTE — Progress Notes (Signed)
Follow-up Outpatient Visit Date: 08/23/2020  Primary Care Provider: [place] Sia, [place] [id number]  Chief Complaint: Follow-up leg swelling  HPI:  Haley Deleon is a 64 y.o. female with history of ulcerative colitis, cholecystitis, and incisional hernia, who presents for follow-up of leg swelling.  Haley Deleon has been under a lot of stress since her daughter died unexpectedly earlier this year.  She is now the caretaker for her grandchildren and is undergoing legal proceedings to obtain permanent custody.  Since our last visit, she underwent vein therapy by vascular surgery in both calves and has noticed significant improvement in her leg swelling.  She will likely need further cosmetic procedures to the veins in her legs.  She denies chest pain, shortness of breath, palpitations, and lightheadedness.  She has been trying to walk regularly and happily reports that she has lost about 30 pounds over the last year.  --------------------------------------------------------------------------------------------------  Past Medical History:  Diagnosis Date  . Anemia   . Atypical chest pain   . Carotid arterial disease ([place])    a. 11/2017 Carotid U/S: RICA <86, LICA nl.  . Claustrophobia    NO MEDS  . Diastolic dysfunction    a. 11/2017 Echo: EF 60-65%, no rwma, Gr1 DD. Nl RV fxn.  . Family history of adverse reaction to anesthesia    1ST COUSIN-WAKE UP DURING SURGERY  . Liver cyst   . Ulcerative colitis ([place])   . Venous insufficiency    Past Surgical History:  Procedure Laterality Date  . BREAST BIOPSY Right 11/02/2011   ultrasound guided biopsy/clip-benign  . CHOLECYSTECTOMY N/A 07/29/2017   Procedure: LAPAROSCOPIC CHOLECYSTECTOMY;  Surgeon: Jules Husbands, MD;  Location: [facility] ORS;  Service: General;  Laterality: N/A;  . COLON SURGERY  2014, 2015   DUE TO ULERATIVE COLITIS WITH COLOSTOMY  . COLOSTOMY TAKEDOWN    . FOOT SURGERY Bilateral    BONE SPURS    . MANDIBLE FRACTURE SURGERY  1991  . MOUTH SURGERY    . VENTRAL HERNIA REPAIR N/A 07/29/2017   Procedure: LAPAROSCOPIC VENTRAL HERNIA;  Surgeon: Jules Husbands, MD;  Location: [facility] ORS;  Service: General;  Laterality: N/A;    Current Meds  Medication Sig  . acetaminophen (TYLENOL) 325 MG tablet Take by mouth.  . ALPRAZolam (XANAX) 0.5 MG tablet Take by mouth.  . ferrous sulfate 325 (65 FE) MG tablet Take 325 mg by mouth daily as needed.   . loperamide (IMODIUM) 2 MG capsule Take 2 mg as needed by mouth for diarrhea or loose stools (NORMALLY TAKES 2-3 TIMES DAILY SINCE HAVING HER COLON SURGERY).   . Multiple Vitamin (MULTIVITAMIN) tablet Take 1 tablet by mouth daily.    Allergies: Caffeine, Chocolate, Chocolate flavor, Cocoa, and Meloxicam  Social History   Tobacco Use  . Smoking status: Former Smoker    Packs/day: 1.00    Years: 20.00    Pack years: 20.00    Types: Cigarettes    Quit date: 07/28/2005    Years since quitting: 15.0  . Smokeless tobacco: Never Used  Vaping Use  . Vaping Use: Never used  Substance Use Topics  . Alcohol use: No    Comment: RARE WINE  . Drug use: No    Family History  Problem Relation Age of Onset  . Heart attack Mother 60  . Hyperlipidemia Brother   . Breast cancer Neg Hx     Review of Systems: A 12-system review of systems was  performed and was negative except as noted in the HPI.  --------------------------------------------------------------------------------------------------  Physical Exam: BP 130/84 (BP Location: Left Arm, Patient Position: Sitting, Cuff Size: Large)   Pulse 94   Ht 5' 7"  (1.702 m)   Wt 249 lb (112.9 kg)   BMI 39.00 kg/m   General:  NAD.  Accompanied by her granddaughter. Neck: No JVD or HJR, though body habitus limits evaluation. Lungs: CTA bilaterally. Heart: RRR w/o m/r/g. Abd: Soft, NT/ND. Ext: Trace ankle edema noted.  EKG:  Normal sinus rhythm with borderline left atrial enlargement.  Otherwise,  no significant abnormality.  Lab Results  Component Value Date   WBC 8.0 08/07/2019   HGB 14.6 08/07/2019   HCT 43.5 08/07/2019   MCV 86.7 08/07/2019   PLT 235 08/07/2019    Lab Results  Component Value Date   NA 141 08/07/2019   K 4.0 08/07/2019   CL 105 08/07/2019   CO2 25 08/07/2019   BUN 17 08/07/2019   CREATININE 0.97 08/07/2019   GLUCOSE 144 (H) 08/07/2019   ALT 16 07/15/2017    Lab Results  Component Value Date   CHOL 149 12/16/2017   HDL 75 12/16/2017   LDLCALC 63 12/16/2017   TRIG 54 12/16/2017   CHOLHDL 2.0 12/16/2017    --------------------------------------------------------------------------------------------------  ASSESSMENT AND PLAN: Leg swelling: Most likely due to varicose veins, with edema having improved considerably following vein interventions by vascular surgery.  Significant weight loss has likely also helped.  I encouraged Ms. Gruber to continue working on her weight loss and to follow-up with vascular surgery as previously arranged.  Follow-up: Return to clinic in 1 year.  Nelva Bush, MD 08/25/2020 1:59 PM

## 2020-08-23 NOTE — Patient Instructions (Signed)
Medication Instructions:  Your physician recommends that you continue on your current medications as directed. Please refer to the Current Medication list given to you today.  *If you need a refill on your cardiac medications before your next appointment, please call your pharmacy*   Lab Work: None Ordered If you have labs (blood work) drawn today and your tests are completely normal, you will receive your results only by: Marland Kitchen MyChart Message (if you have MyChart) OR . A paper copy in the mail If you have any lab test that is abnormal or we need to change your treatment, we will call you to review the results.   Testing/Procedures: None Ordered   Follow-Up: At [facility], you and your health needs are our priority.  As part of our continuing mission to provide you with exceptional heart care, we have created designated Provider Care Teams.  These Care Teams include your primary Cardiologist (physician) and Advanced Practice Providers (APPs -  Physician Assistants and Nurse Practitioners) who all work together to provide you with the care you need, when you need it.  We recommend signing up for the patient portal called "MyChart".  Sign up information is provided on this After Visit Summary.  MyChart is used to connect with patients for Virtual Visits (Telemedicine).  Patients are able to view lab/test results, encounter notes, upcoming appointments, etc.  Non-urgent messages can be sent to your provider as well.   To learn more about what you can do with MyChart, go to [email or web address].    Your next appointment:   1 year(s)  The format for your next appointment:   In Person  Provider:   You may see Nelva Bush, MD or one of the following Advanced Practice Providers on your designated Care Team:    Murray Hodgkins, NP  Christell Faith, PA-C  Marrianne Mood, PA-C  Cadence [place], [place]  Laurann Montana, NP    Other Instructions

## 2020-08-25 ENCOUNTER — Encounter: Payer: Self-pay | Admitting: Internal Medicine

## 2020-08-27 ENCOUNTER — Ambulatory Visit (INDEPENDENT_AMBULATORY_CARE_PROVIDER_SITE_OTHER): Payer: Federal, State, Local not specified - PPO | Admitting: Vascular Surgery

## 2020-09-06 ENCOUNTER — Other Ambulatory Visit: Payer: Self-pay | Admitting: Obstetrics and Gynecology

## 2020-09-06 DIAGNOSIS — Z1231 Encounter for screening mammogram for malignant neoplasm of breast: Secondary | ICD-10-CM

## 2020-10-14 ENCOUNTER — Ambulatory Visit
Admission: RE | Admit: 2020-10-14 | Discharge: 2020-10-14 | Disposition: A | Discharge status: Home / Self Care | Payer: Federal, State, Local not specified - PPO | Source: Ambulatory Visit | Attending: Obstetrics and Gynecology | Admitting: Obstetrics and Gynecology

## 2020-10-14 ENCOUNTER — Other Ambulatory Visit: Payer: Self-pay

## 2020-10-14 DIAGNOSIS — Z1231 Encounter for screening mammogram for malignant neoplasm of breast: Secondary | ICD-10-CM | POA: Diagnosis not present

## 2020-10-22 ENCOUNTER — Ambulatory Visit (INDEPENDENT_AMBULATORY_CARE_PROVIDER_SITE_OTHER): Payer: Federal, State, Local not specified - PPO | Admitting: Nurse Practitioner

## 2020-10-22 ENCOUNTER — Other Ambulatory Visit: Payer: Self-pay

## 2020-10-22 VITALS — BP 121/81 | HR 81 | Ht 67.0 in | Wt 235.0 lb

## 2020-10-22 DIAGNOSIS — I83813 Varicose veins of bilateral lower extremities with pain: Secondary | ICD-10-CM

## 2020-10-23 ENCOUNTER — Telehealth (INDEPENDENT_AMBULATORY_CARE_PROVIDER_SITE_OTHER): Payer: Self-pay | Admitting: Nurse Practitioner

## 2020-10-23 ENCOUNTER — Other Ambulatory Visit (INDEPENDENT_AMBULATORY_CARE_PROVIDER_SITE_OTHER): Payer: Self-pay

## 2020-10-23 NOTE — Telephone Encounter (Signed)
Medications has been added the medication list

## 2020-10-25 ENCOUNTER — Other Ambulatory Visit: Payer: Self-pay | Admitting: Family Medicine

## 2020-10-25 ENCOUNTER — Other Ambulatory Visit: Payer: Self-pay

## 2020-10-25 ENCOUNTER — Ambulatory Visit
Admission: RE | Admit: 2020-10-25 | Discharge: 2020-10-25 | Disposition: A | Discharge status: Home / Self Care | Payer: Federal, State, Local not specified - PPO | Attending: Family Medicine | Admitting: Family Medicine

## 2020-10-25 ENCOUNTER — Ambulatory Visit
Admission: RE | Admit: 2020-10-25 | Discharge: 2020-10-25 | Disposition: A | Discharge status: Home / Self Care | Payer: Federal, State, Local not specified - PPO | Source: Ambulatory Visit | Attending: Family Medicine | Admitting: Family Medicine

## 2020-10-25 DIAGNOSIS — M79631 Pain in right forearm: Secondary | ICD-10-CM | POA: Diagnosis not present

## 2020-10-25 DIAGNOSIS — M25562 Pain in left knee: Secondary | ICD-10-CM

## 2020-10-25 DIAGNOSIS — M25531 Pain in right wrist: Secondary | ICD-10-CM

## 2020-10-25 DIAGNOSIS — W19XXXA Unspecified fall, initial encounter: Secondary | ICD-10-CM | POA: Insufficient documentation

## 2020-10-25 DIAGNOSIS — S0993XA Unspecified injury of face, initial encounter: Secondary | ICD-10-CM

## 2020-10-26 ENCOUNTER — Encounter (INDEPENDENT_AMBULATORY_CARE_PROVIDER_SITE_OTHER): Payer: Self-pay | Admitting: Nurse Practitioner

## 2020-10-26 NOTE — Progress Notes (Signed)
Subjective:    Patient ID: Haley Deleon, female    DOB: 1956/02/13, 65 y.o.   MRN: [id number] Chief Complaint  Patient presents with  . Follow-up    4 wk post laser     The patient returns to the office for followup status post laser ablation of the left saphenous vein on 05/23/2020 followed by right on 07/09/2020.  The patient note significant improvement in the lower extremity pain but not resolution of the symptoms. The patient notes multiple residual varicosities bilaterally which continued to hurt with dependent positions and remained tender to palpation. The patient's swelling is minimally from preoperative status. The patient continues to wear graduated compression stockings on a daily basis but these are not eliminating the pain and discomfort. The patient continues to use over-the-counter anti-inflammatory medications to treat the pain and related symptoms but this has not given the patient relief. The patient notes the pain in the lower extremities is causing problems with daily exercise, problems at work and even with household activities such as preparing meals and doing dishes.  The patient is otherwise done well and there have been no complications related to the laser procedure or interval changes in the patient's overall   Post laser ultrasound shows successful ablation of the bilateral great saphenous veins   Review of Systems  Cardiovascular: Positive for leg swelling.  Hematological: Bruises/bleeds easily.  All other systems reviewed and are negative.      Objective:   Physical Exam Vitals reviewed.  HENT:     Head: Normocephalic.  Cardiovascular:     Rate and Rhythm: Normal rate.     Pulses: Normal pulses.  Pulmonary:     Effort: Pulmonary effort is normal.  Musculoskeletal:        General: Normal range of motion.  Neurological:     Mental Status: She is alert and oriented to person, place, and time.  Psychiatric:        Mood and Affect: Mood normal.         Behavior: Behavior normal.        Thought Content: Thought content normal.        Judgment: Judgment normal.     BP 121/81   Pulse 81   Ht 5' 7"  (1.702 m)   Wt 235 lb (106.6 kg)   BMI 36.81 kg/m   Past Medical History:  Diagnosis Date  . Anemia   . Atypical chest pain   . Carotid arterial disease ([place])    a. 11/2017 Carotid U/S: RICA <28, LICA nl.  . Claustrophobia    NO MEDS  . Diastolic dysfunction    a. 11/2017 Echo: EF 60-65%, no rwma, Gr1 DD. Nl RV fxn.  . Family history of adverse reaction to anesthesia    1ST COUSIN-WAKE UP DURING SURGERY  . Liver cyst   . Ulcerative colitis ([place])   . Venous insufficiency     Social History   Socioeconomic History  . Marital status: Divorced    Spouse name: Not on file  . Number of children: Not on file  . Years of education: Not on file  . Highest education level: Not on file  Occupational History  . Not on file  Tobacco Use  . Smoking status: Former Smoker    Packs/day: 1.00    Years: 20.00    Pack years: 20.00    Types: Cigarettes    Quit date: 07/28/2005    Years since quitting: 15.2  . Smokeless tobacco: Never  Used  Vaping Use  . Vaping Use: Never used  Substance and Sexual Activity  . Alcohol use: No    Comment: RARE WINE  . Drug use: No  . Sexual activity: Not on file  Other Topics Concern  . Not on file  Social History Narrative  . Not on file   Social Determinants of Health   Financial Resource Strain: Not on file  Food Insecurity: Not on file  Transportation Needs: Not on file  Physical Activity: Not on file  Stress: Not on file  Social Connections: Not on file  Intimate Partner Violence: Not on file    Past Surgical History:  Procedure Laterality Date  . BREAST BIOPSY Right 11/02/2011   ultrasound guided biopsy/clip-benign  . CHOLECYSTECTOMY N/A 07/29/2017   Procedure: LAPAROSCOPIC CHOLECYSTECTOMY;  Surgeon: Jules Husbands, MD;  Location: [facility] ORS;  Service: General;  Laterality: N/A;  .  COLON SURGERY  2014, 2015   DUE TO ULERATIVE COLITIS WITH COLOSTOMY  . COLOSTOMY TAKEDOWN    . FOOT SURGERY Bilateral    BONE SPURS   . MANDIBLE FRACTURE SURGERY  1991  . MOUTH SURGERY    . VENTRAL HERNIA REPAIR N/A 07/29/2017   Procedure: LAPAROSCOPIC VENTRAL HERNIA;  Surgeon: Jules Husbands, MD;  Location: [facility] ORS;  Service: General;  Laterality: N/A;    Family History  Problem Relation Age of Onset  . Heart attack Mother 11  . Hyperlipidemia Brother   . Breast cancer Neg Hx     Allergies  Allergen Reactions  . Caffeine Anaphylaxis  . Chocolate Anaphylaxis  . Chocolate Flavor Anaphylaxis  . Cocoa Anaphylaxis  . Meloxicam     CBC Latest Ref Rng & Units 08/07/2019 09/17/2017 07/15/2017  WBC 4.0 - 10.5 K/uL 8.0 7.4 12.2(H)  Hemoglobin 12.0 - 15.0 g/dL 14.6 14.5 15.1  Hematocrit 36.0 - 46.0 % 43.5 45.1 46.3  Platelets 150 - 400 K/uL 235 251 254      CMP     Component Value Date/Time   NA 141 08/07/2019 1549   NA 141 02/26/2014 2309   K 4.0 08/07/2019 1549   K 4.1 02/26/2014 2309   CL 105 08/07/2019 1549   CL 109 (H) 02/26/2014 2309   CO2 25 08/07/2019 1549   CO2 29 02/26/2014 2309   GLUCOSE 144 (H) 08/07/2019 1549   GLUCOSE 95 02/26/2014 2309   BUN 17 08/07/2019 1549   BUN 14 02/26/2014 2309   CREATININE 0.97 08/07/2019 1549   CREATININE 0.90 02/26/2014 2309   CALCIUM 9.2 08/07/2019 1549   CALCIUM 9.0 02/26/2014 2309   PROT 8.1 07/15/2017 1135   PROT 7.2 02/26/2014 2309   ALBUMIN 3.8 07/15/2017 1135   ALBUMIN 2.7 (L) 02/26/2014 2309   AST 23 07/15/2017 1135   AST 22 02/26/2014 2309   ALT 16 07/15/2017 1135   ALT 13 02/26/2014 2309   ALKPHOS 98 07/15/2017 1135   ALKPHOS 108 02/26/2014 2309   BILITOT 0.9 07/15/2017 1135   BILITOT 0.2 02/26/2014 2309   GFRNONAA >60 08/07/2019 1549   GFRNONAA >60 02/26/2014 2309   GFRAA >60 08/07/2019 1549   GFRAA >60 02/26/2014 2309     No results found.     Assessment & Plan:   1. 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  Patient should undergo injection sclerotherapy to treat the residual varicosities.  The risks, benefits and alternative therapies were reviewed in detail with the patient.  All questions were answered.  The patient agrees to proceed with sclerotherapy at their convenience.  The patient will continue wearing the graduated compression stockings and using the over-the-counter pain medications to treat her symptoms.        Current Outpatient Medications on File Prior to Visit  Medication Sig Dispense Refill  . acetaminophen (TYLENOL) 325 MG tablet Take by mouth.    . escitalopram (LEXAPRO) 10 MG tablet Take 10 mg by mouth daily.    . ferrous sulfate 325 (65 FE) MG tablet Take 325 mg by mouth daily as needed.     . hydrOXYzine (ATARAX/VISTARIL) 10 MG tablet Take 10 mg by mouth 3 (three) times daily as needed.    . loperamide (IMODIUM) 2 MG capsule Take 2 mg as needed by mouth for diarrhea or loose stools (NORMALLY TAKES 2-3 TIMES DAILY SINCE HAVING HER COLON SURGERY).     . Multiple Vitamin (MULTIVITAMIN) tablet Take 1 tablet by mouth daily.     No current facility-administered medications on file prior to visit.    There are no Patient Instructions on file for this visit. No follow-ups on file.   Kris Hartmann, NP

## 2020-11-15 ENCOUNTER — Ambulatory Visit (INDEPENDENT_AMBULATORY_CARE_PROVIDER_SITE_OTHER): Payer: Federal, State, Local not specified - PPO | Admitting: Nurse Practitioner

## 2020-11-15 ENCOUNTER — Other Ambulatory Visit: Payer: Self-pay

## 2020-11-15 ENCOUNTER — Encounter (INDEPENDENT_AMBULATORY_CARE_PROVIDER_SITE_OTHER): Payer: Self-pay | Admitting: Nurse Practitioner

## 2020-11-15 VITALS — BP 130/83 | HR 83 | Ht 67.0 in | Wt 237.0 lb

## 2020-11-15 DIAGNOSIS — I83813 Varicose veins of bilateral lower extremities with pain: Secondary | ICD-10-CM

## 2020-11-15 NOTE — Progress Notes (Signed)
Varicose veins of bilatera  lower extremity with inflammation (454.1  I83.10) Current Plans   Indication: Patient presents with symptomatic varicose veins of the bilateral  lower extremity.   Procedure: Sclerotherapy using hypertonic saline mixed with 1% Lidocaine was performed on the bilateral lower extremity. Compression wraps were placed. The patient tolerated the procedure well.

## 2020-12-06 ENCOUNTER — Encounter (INDEPENDENT_AMBULATORY_CARE_PROVIDER_SITE_OTHER): Payer: Self-pay | Admitting: Nurse Practitioner

## 2020-12-06 ENCOUNTER — Ambulatory Visit (INDEPENDENT_AMBULATORY_CARE_PROVIDER_SITE_OTHER): Payer: Federal, State, Local not specified - PPO | Admitting: Nurse Practitioner

## 2020-12-06 ENCOUNTER — Other Ambulatory Visit: Payer: Self-pay

## 2020-12-06 VITALS — BP 120/76 | HR 87 | Resp 16 | Wt 245.0 lb

## 2020-12-06 DIAGNOSIS — I83813 Varicose veins of bilateral lower extremities with pain: Secondary | ICD-10-CM

## 2020-12-11 ENCOUNTER — Encounter (INDEPENDENT_AMBULATORY_CARE_PROVIDER_SITE_OTHER): Payer: Self-pay | Admitting: Nurse Practitioner

## 2020-12-11 NOTE — Progress Notes (Signed)
Varicose veins of bilateral  lower extremity with inflammation (454.1  I83.10) Current Plans   Indication: Patient presents with symptomatic varicose veins of the bilateral  lower extremity.   Procedure: Sclerotherapy using hypertonic saline mixed with 1% Lidocaine was performed on the bilateral lower extremity. Compression wraps were placed. The patient tolerated the procedure well.

## 2020-12-27 ENCOUNTER — Other Ambulatory Visit: Payer: Self-pay

## 2020-12-27 ENCOUNTER — Ambulatory Visit (INDEPENDENT_AMBULATORY_CARE_PROVIDER_SITE_OTHER): Payer: Federal, State, Local not specified - PPO | Admitting: Nurse Practitioner

## 2020-12-27 ENCOUNTER — Encounter (INDEPENDENT_AMBULATORY_CARE_PROVIDER_SITE_OTHER): Payer: Self-pay | Admitting: Nurse Practitioner

## 2020-12-27 VITALS — BP 123/74 | HR 89 | Resp 16 | Wt 249.0 lb

## 2020-12-27 DIAGNOSIS — I83813 Varicose veins of bilateral lower extremities with pain: Secondary | ICD-10-CM | POA: Diagnosis not present

## 2020-12-29 ENCOUNTER — Encounter (INDEPENDENT_AMBULATORY_CARE_PROVIDER_SITE_OTHER): Payer: Self-pay | Admitting: Nurse Practitioner

## 2020-12-29 NOTE — Progress Notes (Signed)
Varicose veins of bilateral  lower extremity with inflammation (454.1  I83.10) Current Plans   Indication: Patient presents with symptomatic varicose veins of the bilateral  lower extremity.   Procedure: Sclerotherapy using hypertonic saline mixed with 1% Lidocaine was performed on the bilateral lower extremity. Compression wraps were placed. The patient tolerated the procedure well.

## 2021-01-17 ENCOUNTER — Ambulatory Visit (INDEPENDENT_AMBULATORY_CARE_PROVIDER_SITE_OTHER): Payer: Federal, State, Local not specified - PPO | Admitting: Nurse Practitioner

## 2021-02-07 ENCOUNTER — Ambulatory Visit (INDEPENDENT_AMBULATORY_CARE_PROVIDER_SITE_OTHER): Payer: Federal, State, Local not specified - PPO | Admitting: Nurse Practitioner

## 2021-02-24 ENCOUNTER — Inpatient Hospital Stay (HOSPITAL_COMMUNITY)
Admit: 2021-02-24 | Discharge: 2021-02-24 | Disposition: A | Discharge status: Home / Self Care | Payer: Federal, State, Local not specified - PPO | Attending: Internal Medicine | Admitting: Internal Medicine

## 2021-02-24 ENCOUNTER — Inpatient Hospital Stay
Admission: EM | Admit: 2021-02-24 | Discharge: 2021-02-24 | DRG: 310 | Disposition: A | Discharge status: Home / Self Care | Payer: Federal, State, Local not specified - PPO | Attending: Internal Medicine | Admitting: Internal Medicine

## 2021-02-24 ENCOUNTER — Other Ambulatory Visit: Payer: Self-pay

## 2021-02-24 ENCOUNTER — Encounter: Payer: Self-pay | Admitting: Emergency Medicine

## 2021-02-24 DIAGNOSIS — Z9049 Acquired absence of other specified parts of digestive tract: Secondary | ICD-10-CM | POA: Diagnosis not present

## 2021-02-24 DIAGNOSIS — R739 Hyperglycemia, unspecified: Secondary | ICD-10-CM | POA: Diagnosis present

## 2021-02-24 DIAGNOSIS — Z8249 Family history of ischemic heart disease and other diseases of the circulatory system: Secondary | ICD-10-CM | POA: Diagnosis not present

## 2021-02-24 DIAGNOSIS — I441 Atrioventricular block, second degree: Secondary | ICD-10-CM | POA: Diagnosis present

## 2021-02-24 DIAGNOSIS — Z6838 Body mass index (BMI) 38.0-38.9, adult: Secondary | ICD-10-CM | POA: Diagnosis not present

## 2021-02-24 DIAGNOSIS — I472 Ventricular tachycardia, unspecified: Secondary | ICD-10-CM

## 2021-02-24 DIAGNOSIS — I471 Supraventricular tachycardia, unspecified: Secondary | ICD-10-CM

## 2021-02-24 DIAGNOSIS — Z87891 Personal history of nicotine dependence: Secondary | ICD-10-CM | POA: Diagnosis not present

## 2021-02-24 DIAGNOSIS — Z91018 Allergy to other foods: Secondary | ICD-10-CM

## 2021-02-24 DIAGNOSIS — F32A Depression, unspecified: Secondary | ICD-10-CM | POA: Diagnosis present

## 2021-02-24 DIAGNOSIS — Z79899 Other long term (current) drug therapy: Secondary | ICD-10-CM

## 2021-02-24 DIAGNOSIS — I491 Atrial premature depolarization: Secondary | ICD-10-CM | POA: Diagnosis present

## 2021-02-24 DIAGNOSIS — Z20822 Contact with and (suspected) exposure to covid-19: Secondary | ICD-10-CM | POA: Diagnosis present

## 2021-02-24 DIAGNOSIS — F419 Anxiety disorder, unspecified: Secondary | ICD-10-CM | POA: Diagnosis present

## 2021-02-24 DIAGNOSIS — Z886 Allergy status to analgesic agent status: Secondary | ICD-10-CM

## 2021-02-24 DIAGNOSIS — F1010 Alcohol abuse, uncomplicated: Secondary | ICD-10-CM | POA: Diagnosis present

## 2021-02-24 DIAGNOSIS — R002 Palpitations: Secondary | ICD-10-CM | POA: Diagnosis not present

## 2021-02-24 DIAGNOSIS — I4729 Other ventricular tachycardia: Secondary | ICD-10-CM

## 2021-02-24 DIAGNOSIS — I493 Ventricular premature depolarization: Secondary | ICD-10-CM

## 2021-02-24 LAB — ECHOCARDIOGRAM COMPLETE
AR max vel: 2.65 cm2
AV Area VTI: 2.48 cm2
AV Area mean vel: 2.33 cm2
AV Mean grad: 3.5 mmHg
AV Peak grad: 7.3 mmHg
Ao pk vel: 1.35 m/s
Area-P 1/2: 3.27 cm2
Height: 67 in
S' Lateral: 2.99 cm
Weight: 3920 oz

## 2021-02-24 LAB — BASIC METABOLIC PANEL
Anion gap: 9 (ref 5–15)
BUN: 15 mg/dL (ref 8–23)
CO2: 27 mmol/L (ref 22–32)
Calcium: 8.8 mg/dL — ABNORMAL LOW (ref 8.9–10.3)
Chloride: 104 mmol/L (ref 98–111)
Creatinine, Ser: 0.72 mg/dL (ref 0.44–1.00)
GFR, Estimated: >60 mL/min (ref >60)
Glucose, Bld: 183 mg/dL — ABNORMAL HIGH (ref 70–99)
Potassium: 3.8 mmol/L (ref 3.5–5.1)
Sodium: 140 mmol/L (ref 135–145)

## 2021-02-24 LAB — CBC
HCT: 47.1 % — ABNORMAL HIGH (ref 36.0–46.0)
Hemoglobin: 15.4 g/dL — ABNORMAL HIGH (ref 12.0–15.0)
MCH: 30.6 pg (ref 26.0–34.0)
MCHC: 32.7 g/dL (ref 30.0–36.0)
MCV: 93.5 fL (ref 80.0–100.0)
Platelets: 235 10*3/uL (ref 150–400)
RBC: 5.04 MIL/uL (ref 3.87–5.11)
RDW: 13.4 % (ref 11.5–15.5)
WBC: 7.2 10*3/uL (ref 4.0–10.5)
nRBC: 0 % (ref 0.0–0.2)

## 2021-02-24 LAB — URINE DRUG SCREEN, QUALITATIVE (ARMC ONLY)
Amphetamines, Ur Screen: NOT DETECTED
Barbiturates, Ur Screen: NOT DETECTED
Benzodiazepine, Ur Scrn: NOT DETECTED
Cannabinoid 50 Ng, Ur ~~LOC~~: NOT DETECTED
Cocaine Metabolite,Ur ~~LOC~~: NOT DETECTED
MDMA (Ecstasy)Ur Screen: NOT DETECTED
Methadone Scn, Ur: NOT DETECTED
Opiate, Ur Screen: NOT DETECTED
Phencyclidine (PCP) Ur S: NOT DETECTED
Tricyclic, Ur Screen: NOT DETECTED

## 2021-02-24 LAB — TSH: TSH: 6.857 u[IU]/mL — ABNORMAL HIGH (ref 0.350–4.500)

## 2021-02-24 LAB — ETHANOL: Alcohol, Ethyl (B): <10 mg/dL (ref <10)

## 2021-02-24 LAB — TROPONIN I (HIGH SENSITIVITY)
Troponin I (High Sensitivity): 4 ng/L (ref <18)
Troponin I (High Sensitivity): 5 ng/L (ref <18)

## 2021-02-24 LAB — MAGNESIUM: Magnesium: 2 mg/dL (ref 1.7–2.4)

## 2021-02-24 LAB — RESP PANEL BY RT-PCR (FLU A&B, COVID) ARPGX2
Influenza A by PCR: NEGATIVE
Influenza B by PCR: NEGATIVE
SARS Coronavirus 2 by RT PCR: NEGATIVE

## 2021-02-24 LAB — HEMOGLOBIN A1C
Hgb A1c MFr Bld: 5.3 % (ref 4.8–5.6)
Mean Plasma Glucose: 105 mg/dL

## 2021-02-24 LAB — T4, FREE: Free T4: 0.8 ng/dL (ref 0.61–1.12)

## 2021-02-24 MED ORDER — THIAMINE HCL 100 MG PO TABS
100.0000 mg | ORAL_TABLET | Freq: Every day | ORAL | Status: DC
  Administered 2021-02-24: 100 mg via ORAL
  Filled 2021-02-24: qty 1

## 2021-02-24 MED ORDER — ENOXAPARIN SODIUM 60 MG/0.6ML IJ SOSY: 0.5000 mg/kg | PREFILLED_SYRINGE | INTRAMUSCULAR | Status: DC

## 2021-02-24 MED ORDER — ADULT MULTIVITAMIN W/MINERALS CH
1.0000 | ORAL_TABLET | Freq: Every day | ORAL | Status: DC
  Administered 2021-02-24: 1 via ORAL
  Filled 2021-02-24: qty 1

## 2021-02-24 MED ORDER — LOPERAMIDE HCL 2 MG PO CAPS: 2.0000 mg | ORAL_CAPSULE | ORAL | Status: DC | PRN

## 2021-02-24 MED ORDER — FOLIC ACID 1 MG PO TABS
1.0000 mg | ORAL_TABLET | Freq: Every day | ORAL | Status: DC
  Administered 2021-02-24: 1 mg via ORAL
  Filled 2021-02-24: qty 1

## 2021-02-24 MED ORDER — SODIUM CHLORIDE 0.9 % IV BOLUS (SEPSIS)
1000.0000 mL | Freq: Once | INTRAVENOUS | Status: AC
  Administered 2021-02-24: 1000 mL via INTRAVENOUS

## 2021-02-24 MED ORDER — AMIODARONE HCL IN DEXTROSE 360-4.14 MG/200ML-% IV SOLN
60.0000 mg/h | INTRAVENOUS | Status: AC
  Administered 2021-02-24: 60 mg/h via INTRAVENOUS
  Filled 2021-02-24: qty 200

## 2021-02-24 MED ORDER — THIAMINE HCL 100 MG/ML IJ SOLN: 100.0000 mg | Freq: Every day | INTRAMUSCULAR | Status: DC

## 2021-02-24 MED ORDER — LORAZEPAM 1 MG PO TABS: 1.0000 mg | ORAL_TABLET | ORAL | Status: DC | PRN

## 2021-02-24 MED ORDER — LORAZEPAM 2 MG/ML IJ SOLN
1.0000 mg | INTRAMUSCULAR | Status: DC | PRN
Start: 2021-02-24 — End: 2021-02-24

## 2021-02-24 MED ORDER — ESCITALOPRAM OXALATE 10 MG PO TABS
10.0000 mg | ORAL_TABLET | Freq: Every day | ORAL | Status: DC
  Filled 2021-02-24: qty 1

## 2021-02-24 MED ORDER — METOPROLOL TARTRATE 5 MG/5ML IV SOLN
5.0000 mg | Freq: Once | INTRAVENOUS | Status: AC
  Administered 2021-02-24: 5 mg via INTRAVENOUS
  Filled 2021-02-24: qty 5

## 2021-02-24 NOTE — Discharge Summary (Signed)
[facility] NAME: Haley Deleon    MR#:  [id number]  DATE OF BIRTH:  1956-08-26  DATE OF ADMISSION:  02/24/2021 ADMITTING PHYSICIAN: Rise Patience, MD  DATE OF DISCHARGE: 02/24/2021  PRIMARY CARE PHYSICIAN: [facility] [place]    ADMISSION DIAGNOSIS:  Palpitations [R00.2]  DISCHARGE DIAGNOSIS:  Palpitation due to frequent PVC and short run off and NSVT chronic anxiety SECONDARY DIAGNOSIS:   Past Medical History:  Diagnosis Date  . Anemia   . Atypical chest pain   . Carotid arterial disease ([place])    a. 11/2017 Carotid U/S: RICA <20, LICA nl.  . Claustrophobia    NO MEDS  . Diastolic dysfunction    a. 11/2017 Echo: EF 60-65%, no rwma, Gr1 DD. Nl RV fxn.  . Family history of adverse reaction to anesthesia    1ST COUSIN-WAKE UP DURING SURGERY  . Liver cyst   . Ulcerative colitis ([place])   . Venous insufficiency     HOSPITAL COURSE:  Haley Deleon is a 65 y.o. female with history of ulcerative colitis status post total colectomy, history of diastolic dysfunction per 2D echo done in 2019 presents to the ER after patient has been having sudden onset of palpitations after she went to bed.  Patient states she has had palpitation off and on very briefly which did not bother her but last night after going to bed she started having persistent palpitations.   Palpitation with multiple runs of NSVT and PVCs  -- ER physician initially given IV metoprolol despite which the rhythm persisted -- [facility] ca ardiology who recommended patient being started on amiodarone drip. Patient is currently of amiodarone drip. Her heart rate in the 60s and 70s with occasional PVCs. She was seen by Dr. Fletcher Anon. Patient will get zio monitor be mailed at her home to where and follow-up with her primary cardiologist Dr End as outpatient --  2D echo reviewed.  Alcohol abuse -advised about quitting.  -- No signs of withdrawal. Hemodynamically  stable. Patient advised about alcohol related health issues.  Chronic anxiety on Lexapro and as needed hydroxyzine. Patient [facility]. She tells me these meds are helping her.  History of ulcerative colitis status post total colectomy.  Morbid obesity. Patient advised on healthy lifestyle with diet and exercise. She has tendency to binge eat.  Overall hemodynamically stable feeling well. She is requesting to go home. Discussed with cardiology okay to discharge. Patient will follow-up [facility] cardiology as outpatient. . CONSULTS OBTAINED:  Treatment Team:  Wellington Hampshire, MD  DRUG ALLERGIES:   Allergies  Allergen Reactions  . Caffeine Anaphylaxis  . Chocolate Anaphylaxis  . Chocolate Flavor Anaphylaxis  . Cocoa Anaphylaxis  . Meloxicam Other (See Comments)    migraine and bleeding     DISCHARGE MEDICATIONS:   Allergies as of 02/24/2021      Reactions   Caffeine Anaphylaxis   Chocolate Anaphylaxis   Chocolate Flavor Anaphylaxis   Cocoa Anaphylaxis   Meloxicam Other (See Comments)   migraine and bleeding       Medication List    TAKE these medications   escitalopram 10 MG tablet Commonly known as: LEXAPRO Take 10 mg by mouth daily.   ferrous sulfate 325 (65 FE) MG tablet Take 325 mg by mouth daily as needed.   hydrOXYzine 10 MG tablet Commonly known as: ATARAX/VISTARIL Take 10 mg by mouth in the morning and at bedtime.  loperamide 2 MG capsule Commonly known as: IMODIUM Take 2 mg as needed by mouth for diarrhea or loose stools (NORMALLY TAKES 2-3 TIMES DAILY SINCE HAVING HER COLON SURGERY).   multivitamin tablet Take 1 tablet by mouth daily.       If you experience worsening of your admission symptoms, develop shortness of breath, life threatening emergency, suicidal or homicidal thoughts you must seek medical attention immediately by calling 911 or calling your MD immediately  if symptoms less severe.  You Must read complete  instructions/literature along with all the possible adverse reactions/side effects for all the Medicines you take and that have been prescribed to you. Take any new Medicines after you have completely understood and accept all the possible adverse reactions/side effects.   Please note  You were cared for by a hospitalist during your hospital stay. If you have any questions about your discharge medications or the care you received while you were in the hospital after you are discharged, you can call the unit and asked to speak with the hospitalist on call if the hospitalist that took care of you is not available. Once you are discharged, your primary care physician will handle any further medical issues. Please note that NO REFILLS for any discharge medications will be authorized once you are discharged, as it is imperative that you return to your primary care physician (or establish a relationship with a primary care physician if you do not have one) for your aftercare needs so that they can reassess your need for medications and monitor your lab values. Today   SUBJECTIVE   I feel a lot better today.  I would like to go home to my granddaughter.  VITAL SIGNS:  Blood pressure 135/81, pulse 69, temperature 98.4 F (36.9 C), temperature source Oral, resp. rate 16, height 5' 7"  (1.702 m), weight 111.1 kg, SpO2 95 %.  I/O:    Intake/Output Summary (Last 24 hours) at 02/24/2021 1436 Last data filed at 02/24/2021 1006 Gross per 24 hour  Intake 176.72 ml  Output --  Net 176.72 ml    PHYSICAL EXAMINATION:  GENERAL:  65 y.o.-year-old patient lying in the bed with no acute distress. obese LUNGS: Normal breath sounds bilaterally, no wheezing, rales,rhonchi or crepitation. No use of accessory muscles of respiration.  CARDIOVASCULAR: S1, S2 normal. No murmurs, rubs, or gallops.  ABDOMEN: Soft, non-tender, non-distended. Bowel sounds present. No organomegaly or mass.  EXTREMITIES: No pedal edema,  cyanosis, or clubbing.  NEUROLOGIC: non focal , no tremors PSYCHIATRIC: The patient is alert and oriented x 3.  SKIN: No obvious rash, lesion, or ulcer.   DATA REVIEW:   CBC  Recent Labs  Lab 02/24/21 0245  WBC 7.2  HGB 15.4*  HCT 47.1*  PLT 235    Chemistries  Recent Labs  Lab 02/24/21 0245  NA 140  K 3.8  CL 104  CO2 27  GLUCOSE 183*  BUN 15  CREATININE 0.72  CALCIUM 8.8*  MG 2.0    Microbiology Results   Recent Results (from the past 240 hour(s))  Resp Panel by RT-PCR (Flu A&B, Covid) Nasopharyngeal Swab     Status: None   Collection Time: 02/24/21  4:45 AM   Specimen: Nasopharyngeal Swab; Nasopharyngeal(NP) swabs in vial transport medium  Result Value Ref Range Status   SARS Coronavirus 2 by RT PCR NEGATIVE NEGATIVE Final    Comment: (NOTE) SARS-CoV-2 target nucleic acids are NOT DETECTED.  The SARS-CoV-2 RNA is generally detectable in upper respiratory  specimens during the acute phase of infection. The lowest concentration of SARS-CoV-2 viral copies this assay can detect is 138 copies/mL. A negative result does not preclude SARS-Cov-2 infection and should not be used as the sole basis for treatment or other patient management decisions. A negative result may occur with  improper specimen collection/handling, submission of specimen other than nasopharyngeal swab, presence of viral mutation(s) within the areas targeted by this assay, and inadequate number of viral copies(<138 copies/mL). A negative result must be combined with clinical observations, patient history, and epidemiological information. The expected result is Negative.  Fact Sheet for Patients:  EntrepreneurPulse.com.au  Fact Sheet for Healthcare Providers:  IncredibleEmployment.be  This test is no t yet approved or cleared by the [place] FDA and  has been authorized for detection and/or diagnosis of SARS-CoV-2 by FDA under an Emergency Use  Authorization (EUA). This EUA will remain  in effect (meaning this test can be used) for the duration of the COVID-19 declaration under Section 564(b)(1) of the Act, 21 U.S.C.section 360bbb-3(b)(1), unless the authorization is terminated  or revoked sooner.       Influenza A by PCR NEGATIVE NEGATIVE Final   Influenza B by PCR NEGATIVE NEGATIVE Final    Comment: (NOTE) The Xpert Xpress SARS-CoV-2/FLU/RSV plus assay is intended as an aid in the diagnosis of influenza from Nasopharyngeal swab specimens and should not be used as a sole basis for treatment. Nasal washings and aspirates are unacceptable for Xpert Xpress SARS-CoV-2/FLU/RSV testing.  Fact Sheet for Patients: EntrepreneurPulse.com.au  Fact Sheet for Healthcare Providers: IncredibleEmployment.be  This test is not yet approved or cleared by the [place] FDA and has been authorized for detection and/or diagnosis of SARS-CoV-2 by FDA under an Emergency Use Authorization (EUA). This EUA will remain in effect (meaning this test can be used) for the duration of the COVID-19 declaration under Section 564(b)(1) of the Act, 21 U.S.C. section 360bbb-3(b)(1), unless the authorization is terminated or revoked.  Performed at [facility], [address] [place], [place] [address]     RADIOLOGY:  ECHOCARDIOGRAM COMPLETE  Result Date: 02/24/2021    ECHOCARDIOGRAM REPORT   Patient Name:   Haley Deleon Date of Exam: 02/24/2021 Medical Rec #:  [id number]       Height:       67.0 in Accession #:    [id number]      Weight:       245.0 lb Date of Birth:  November 01, 1955       BSA:          2.204 m Patient Age:    14 years        BP:           119/63 mmHg Patient Gender: F               HR:           78 bpm. Exam Location:  [facility] Procedure: 2D Echo, Cardiac Doppler and Color Doppler Indications:     Ventricular Tachycardia I47.2  History:         Patient has no prior history of Echocardiogram  examinations and                  Patient has prior history of Echocardiogram examinations, most                  recent 12/16/2017. CAD; Signs/Symptoms:Chest Pain.  Sonographer:     Sherrie Sport RDCS (AE) Referring Phys:  [id number] Rise Patience Diagnosing Phys: Kathlyn Sacramento MD  Sonographer Comments: Suboptimal parasternal window. IMPRESSIONS  1. Left ventricular ejection fraction, by estimation, is 60 to 65%. The left ventricle has normal function. The left ventricle has no regional wall motion abnormalities. Left ventricular diastolic parameters were normal.  2. Right ventricular systolic function is normal. The right ventricular size is normal. There is normal pulmonary artery systolic pressure.  3. The mitral valve is normal in structure. No evidence of mitral valve regurgitation. No evidence of mitral stenosis.  4. The aortic valve is normal in structure. Aortic valve regurgitation is not visualized. No aortic stenosis is present. FINDINGS  Left Ventricle: Left ventricular ejection fraction, by estimation, is 60 to 65%. The left ventricle has normal function. The left ventricle has no regional wall motion abnormalities. The left ventricular internal cavity size was normal in size. There is  no left ventricular hypertrophy. Left ventricular diastolic parameters were normal. Right Ventricle: The right ventricular size is normal. No increase in right ventricular wall thickness. Right ventricular systolic function is normal. There is normal pulmonary artery systolic pressure. The tricuspid regurgitant velocity is 2.42 m/s, and  with an assumed right atrial pressure of 3 mmHg, the estimated right ventricular systolic pressure is 63.8 mmHg. Left Atrium: Left atrial size was normal in size. Right Atrium: Right atrial size was normal in size. Pericardium: There is no evidence of pericardial effusion. Mitral Valve: The mitral valve is normal in structure. No evidence of mitral valve regurgitation. No evidence of mitral  valve stenosis. Tricuspid Valve: The tricuspid valve is normal in structure. Tricuspid valve regurgitation is trivial. No evidence of tricuspid stenosis. Aortic Valve: The aortic valve is normal in structure. Aortic valve regurgitation is not visualized. No aortic stenosis is present. Aortic valve mean gradient measures 3.5 mmHg. Aortic valve peak gradient measures 7.3 mmHg. Aortic valve area, by VTI measures 2.48 cm. Pulmonic Valve: The pulmonic valve was normal in structure. Pulmonic valve regurgitation is not visualized. No evidence of pulmonic stenosis. Aorta: The aortic root is normal in size and structure. Venous: The inferior vena cava was not well visualized. IAS/Shunts: No atrial level shunt detected by color flow Doppler.  LEFT VENTRICLE PLAX 2D LVIDd:         4.77 cm  Diastology LVIDs:         2.99 cm  LV e' medial:    10.00 cm/s LV PW:         1.06 cm  LV E/e' medial:  9.4 LV IVS:        0.77 cm  LV e' lateral:   9.46 cm/s LVOT diam:     2.00 cm  LV E/e' lateral: 10.0 LV SV:         69 LV SV Index:   31 LVOT Area:     3.14 cm  RIGHT VENTRICLE RV Basal diam:  4.61 cm RV S prime:     13.50 cm/s TAPSE (M-mode): 4.6 cm LEFT ATRIUM             Index       RIGHT ATRIUM           Index LA diam:        4.00 cm 1.81 cm/m  RA Area:     16.80 cm LA Vol (A2C):   50.3 ml 22.82 ml/m RA Volume:   42.70 ml  19.37 ml/m LA Vol (A4C):   31.1 ml 14.11 ml/m LA Biplane Vol: 42.3 ml 19.19 ml/m  AORTIC VALVE                   PULMONIC VALVE AV Area (Vmax):    2.65 cm    PV Vmax:        0.79 m/s AV Area (Vmean):   2.33 cm    PV Peak grad:   2.5 mmHg AV Area (VTI):     2.48 cm    RVOT Peak grad: 3 mmHg AV Vmax:           135.00 cm/s AV Vmean:          89.600 cm/s AV VTI:            0.280 m AV Peak Grad:      7.3 mmHg AV Mean Grad:      3.5 mmHg LVOT Vmax:         114.00 cm/s LVOT Vmean:        66.500 cm/s LVOT VTI:          0.221 m LVOT/AV VTI ratio: 0.79  AORTA Ao Root diam: 2.85 cm MITRAL VALVE                TRICUSPID VALVE MV Area (PHT): 3.27 cm    TR Peak grad:   23.4 mmHg MV Decel Time: 232 msec    TR Vmax:        242.00 cm/s MV E velocity: 94.30 cm/s MV A velocity: 78.80 cm/s  SHUNTS MV E/A ratio:  1.20        Systemic VTI:  0.22 m                            Systemic Diam: 2.00 cm Kathlyn Sacramento MD Electronically signed by Kathlyn Sacramento MD Signature Date/Time: 02/24/2021/12:22:31 PM    Final      CODE STATUS:     Code Status Orders  (From admission, onward)         Start     Ordered   02/24/21 0449  Full code  Continuous        02/24/21 0449        Code Status History    This patient has a current code status but no historical code status.   Advance Care Planning Activity       TOTAL TIME TAKING CARE OF THIS PATIENT: *35* minutes.    Fritzi Mandes M.D  [facility]    CC: Primary care physician; [facility] [place]

## 2021-02-24 NOTE — ED Triage Notes (Addendum)
Pt states has felt palpitations, sensation of feeling "like my heart is trying to reset itself". Pt states has sensation of heart "fluttering" pt states she awoke this am with same. Pt drinks ETOH daily. Pt denies pain. Pt with very frequent multiple PACs noted while hooked to ekg machine

## 2021-02-24 NOTE — ED Notes (Signed)
Dr

## 2021-02-24 NOTE — Consult Note (Signed)
Cardiology Consultation:   Patient ID: Haley Deleon MRN: [id number]; DOB: 1956/07/28  Admit date: 02/24/2021 Date of Consult: 02/24/2021  PCP:  [facility]  Cardiologist:  Nelva Bush, MD  Advanced Practice Provider:  No care team member to display Electrophysiologist:  None   Patient Profile:   Haley Deleon is a 65 y.o. female with a hx of ulcerative colitis, cholecystitis, incisional hernia, varicose veins, and who is being seen today for the evaluation of palpitations at the request of Dr. Posey Pronto.  History of Present Illness:   Haley Deleon is a 65 year old female with no previously known history of cardiac disease.  She last presented to [facility] 09/19/2020 and reported a significant amount of stress since her daughter unexpectedly died died earlier in the year.  She reported lower extremity edema after recent vein therapy by vascular surgery.  Ongoing weight loss and follow-up with vascular surgery was encouraged.  She presented to [facility] 02/24/2021 with report of palpitations.  She reports significant stress and anxiety since the death of her daughter last year.  The patient reports having an eating disorder where she binges on sweets and occasionally alcohol.  Yesterday, she had half a bag of  [employer] cups and a half a box of donuts.  She then went to eat outside and ordered a margarita that she had to drink quickly because the restaurant was closing.  Shortly after that, she started having palpitations with fatigue but no dizziness, syncope or presyncope.  No chest pain or shortness of breath.  She came to the ED for evaluation and was noted to have frequent PVCs as well as short runs of nonsustained ventricular tachycardia up to 6 beats.  She was placed on amiodarone drip which was subsequently discontinued overnight.  On the monitor, she was noted to have intermittent bradycardia at night with second-degree AV  block likely Mobitz 1.   She feels back to baseline at the present time and she wants to go home.  She denies prior syncopal episodes.  Troponin is negative.  Since presenting to the emergency department, echo has been obtained with EF 60 to 65%, NRWM.   Past Medical History:  Diagnosis Date  . Anemia   . Atypical chest pain   . Carotid arterial disease ([place])    a. 11/2017 Carotid U/S: RICA <56, LICA nl.  . Claustrophobia    NO MEDS  . Diastolic dysfunction    a. 11/2017 Echo: EF 60-65%, no rwma, Gr1 DD. Nl RV fxn.  . Family history of adverse reaction to anesthesia    1ST COUSIN-WAKE UP DURING SURGERY  . Liver cyst   . Ulcerative colitis ([place])   . Venous insufficiency     Past Surgical History:  Procedure Laterality Date  . BREAST BIOPSY Right 11/02/2011   ultrasound guided biopsy/clip-benign  . CHOLECYSTECTOMY N/A 07/29/2017   Procedure: LAPAROSCOPIC CHOLECYSTECTOMY;  Surgeon: Jules Husbands, MD;  Location: [facility] ORS;  Service: General;  Laterality: N/A;  . COLON SURGERY  2014, 2015   DUE TO ULERATIVE COLITIS WITH COLOSTOMY  . COLOSTOMY TAKEDOWN    . FOOT SURGERY Bilateral    BONE SPURS   . MANDIBLE FRACTURE SURGERY  1991  . MOUTH SURGERY    . VENTRAL HERNIA REPAIR N/A 07/29/2017   Procedure: LAPAROSCOPIC VENTRAL HERNIA;  Surgeon: Jules Husbands, MD;  Location: [facility] ORS;  Service: General;  Laterality: N/A;     Home Medications:  Prior to Admission medications   Medication Sig Start Date End Date Taking? Authorizing Provider  escitalopram (LEXAPRO) 10 MG tablet Take 10 mg by mouth daily.   Yes [provider]  ferrous sulfate 325 (65 FE) MG tablet Take 325 mg by mouth daily as needed.    Yes [provider]  hydrOXYzine (ATARAX/VISTARIL) 10 MG tablet Take 10 mg by mouth in the morning and at bedtime. 10/03/20  Yes [provider]  loperamide (IMODIUM) 2 MG capsule Take 2 mg as needed by mouth for diarrhea or loose stools (NORMALLY TAKES 2-3 TIMES  DAILY SINCE HAVING HER COLON SURGERY).    Yes [provider]  Multiple Vitamin (MULTIVITAMIN) tablet Take 1 tablet by mouth daily.   Yes [provider]    Inpatient Medications: Scheduled Meds: . enoxaparin (LOVENOX) injection  0.5 mg/kg Subcutaneous Q24H  . escitalopram  10 mg Oral Daily  . folic acid  1 mg Oral Daily  . multivitamin with minerals  1 tablet Oral Daily  . thiamine  100 mg Oral Daily   Or  . thiamine  100 mg Intravenous Daily   Continuous Infusions:  PRN Meds: loperamide, LORazepam **OR** LORazepam  Allergies:    Allergies  Allergen Reactions  . Caffeine Anaphylaxis  . Chocolate Anaphylaxis  . Chocolate Flavor Anaphylaxis  . Cocoa Anaphylaxis  . Meloxicam Other (See Comments)    migraine and bleeding     Social History:   Social History   Socioeconomic History  . Marital status: Divorced    Spouse name: Not on file  . Number of children: Not on file  . Years of education: Not on file  . Highest education level: Not on file  Occupational History  . Not on file  Tobacco Use  . Smoking status: Former Smoker    Packs/day: 1.00    Years: 20.00    Pack years: 20.00    Types: Cigarettes    Quit date: 07/28/2005    Years since quitting: 15.5  . Smokeless tobacco: Never Used  Vaping Use  . Vaping Use: Never used  Substance and Sexual Activity  . Alcohol use: Yes    Comment: RARE WINE  . Drug use: No  . Sexual activity: Not on file  Other Topics Concern  . Not on file  Social History Narrative  . Not on file   Social Determinants of Health   Financial Resource Strain: Not on file  Food Insecurity: Not on file  Transportation Needs: Not on file  Physical Activity: Not on file  Stress: Not on file  Social Connections: Not on file  Intimate Partner Violence: Not on file    Family History:    Family History  Problem Relation Age of Onset  . Heart attack Mother 70  . Hyperlipidemia Brother   . Breast cancer Neg Hx       ROS:  Please see the history of present illness.   All other ROS reviewed and negative.     Physical Exam/Data:   Vitals:   02/24/21 0730 02/24/21 0800 02/24/21 0900 02/24/21 1000  BP: 102/60 123/73 125/72 (!) 117/93  Pulse: 73 70 69 68  Resp:  17 16 20   Temp:      TempSrc:      SpO2: 98% 97% 99% 98%  Weight:      Height:        Intake/Output Summary (Last 24 hours) at 02/24/2021 1323 Last data filed at 02/24/2021 1006 Gross per 24  hour  Intake 176.72 ml  Output --  Net 176.72 ml   Last 3 Weights 02/24/2021 12/27/2020 12/06/2020  Weight (lbs) 245 lb 249 lb 244 lb 15.4 oz  Weight (kg) 111.131 kg 112.946 kg 111.113 kg     Body mass index is 38.37 kg/m.  General:  Well nourished, well developed, in no acute distress HEENT: normal Lymph: no adenopathy Neck: no JVD Endocrine:  No thryomegaly Vascular: No carotid bruits; FA pulses 2+ bilaterally without bruits  Cardiac:  normal S1, S2; RRR; no murmur  Lungs:  clear to auscultation bilaterally, no wheezing, rhonchi or rales  Abd: soft, nontender, no hepatomegaly  Ext: no edema Musculoskeletal:  No deformities, BUE and BLE strength normal and equal Skin: warm and dry  Neuro:  CNs 2-12 intact, no focal abnormalities noted Psych:  Normal affect   EKG:  The EKG was personally reviewed and demonstrates: Sinus tachycardia, PACs, poor R wave progression in lead III likely due to lead placement, poor R wave progression in V1 Telemetry:  Telemetry was personally reviewed and demonstrates: NSR, SVT, PVCs, NSVT with no significant pauses.  Sinus bradycardia and intermittent second-degree AV block likely Mobitz 1.  Relevant CV Studies: Echo 01/2021 1. Left ventricular ejection fraction, by estimation, is 60 to 65%. The  left ventricle has normal function. The left ventricle has no regional  wall motion abnormalities. Left ventricular diastolic parameters were  normal.  2. Right ventricular systolic function is normal. The right  ventricular  size is normal. There is normal pulmonary artery systolic pressure.  3. The mitral valve is normal in structure. No evidence of mitral valve  regurgitation. No evidence of mitral stenosis.  4. The aortic valve is normal in structure. Aortic valve regurgitation is  not visualized. No aortic stenosis is present.   Laboratory Data:  High Sensitivity Troponin:   Recent Labs  Lab 02/24/21 0245 02/24/21 0445  TROPONINIHS 4 5     Chemistry Recent Labs  Lab 02/24/21 0245  NA 140  K 3.8  CL 104  CO2 27  GLUCOSE 183*  BUN 15  CREATININE 0.72  CALCIUM 8.8*  GFRNONAA >60  ANIONGAP 9    No results for input(s): PROT, ALBUMIN, AST, ALT, ALKPHOS, BILITOT in the last 168 hours. Hematology Recent Labs  Lab 02/24/21 0245  WBC 7.2  RBC 5.04  HGB 15.4*  HCT 47.1*  MCV 93.5  MCH 30.6  MCHC 32.7  RDW 13.4  PLT 235   BNPNo results for input(s): BNP, PROBNP in the last 168 hours.  DDimer No results for input(s): DDIMER in the last 168 hours.   Radiology/Studies:  ECHOCARDIOGRAM COMPLETE  Result Date: 02/24/2021    ECHOCARDIOGRAM REPORT   Patient Name:   LILLIEMAE FRUGE Date of Exam: 02/24/2021 Medical Rec #:  [id number]       Height:       67.0 in Accession #:    [id number]      Weight:       245.0 lb Date of Birth:  01-13-1956       BSA:          2.204 m Patient Age:    86 years        BP:           119/63 mmHg Patient Gender: F               HR:           78 bpm. Exam Location:  [place]  Procedure: 2D Echo, Cardiac Doppler and Color Doppler Indications:     Ventricular Tachycardia I47.2  History:         Patient has no prior history of Echocardiogram examinations and                  Patient has prior history of Echocardiogram examinations, most                  recent 12/16/2017. CAD; Signs/Symptoms:Chest Pain.  Sonographer:     Sherrie Sport RDCS (AE) Referring Phys:  [place] Diagnosing Phys: Kathlyn Sacramento MD  Sonographer Comments: Suboptimal parasternal window.  IMPRESSIONS  1. Left ventricular ejection fraction, by estimation, is 60 to 65%. The left ventricle has normal function. The left ventricle has no regional wall motion abnormalities. Left ventricular diastolic parameters were normal.  2. Right ventricular systolic function is normal. The right ventricular size is normal. There is normal pulmonary artery systolic pressure.  3. The mitral valve is normal in structure. No evidence of mitral valve regurgitation. No evidence of mitral stenosis.  4. The aortic valve is normal in structure. Aortic valve regurgitation is not visualized. No aortic stenosis is present. FINDINGS  Left Ventricle: Left ventricular ejection fraction, by estimation, is 60 to 65%. The left ventricle has normal function. The left ventricle has no regional wall motion abnormalities. The left ventricular internal cavity size was normal in size. There is  no left ventricular hypertrophy. Left ventricular diastolic parameters were normal. Right Ventricle: The right ventricular size is normal. No increase in right ventricular wall thickness. Right ventricular systolic function is normal. There is normal pulmonary artery systolic pressure. The tricuspid regurgitant velocity is 2.42 m/s, and  with an assumed right atrial pressure of 3 mmHg, the estimated right ventricular systolic pressure is 94.8 mmHg. Left Atrium: Left atrial size was normal in size. Right Atrium: Right atrial size was normal in size. Pericardium: There is no evidence of pericardial effusion. Mitral Valve: The mitral valve is normal in structure. No evidence of mitral valve regurgitation. No evidence of mitral valve stenosis. Tricuspid Valve: The tricuspid valve is normal in structure. Tricuspid valve regurgitation is trivial. No evidence of tricuspid stenosis. Aortic Valve: The aortic valve is normal in structure. Aortic valve regurgitation is not visualized. No aortic stenosis is present. Aortic valve mean gradient measures 3.5 mmHg.  Aortic valve peak gradient measures 7.3 mmHg. Aortic valve area, by VTI measures 2.48 cm. Pulmonic Valve: The pulmonic valve was normal in structure. Pulmonic valve regurgitation is not visualized. No evidence of pulmonic stenosis. Aorta: The aortic root is normal in size and structure. Venous: The inferior vena cava was not well visualized. IAS/Shunts: No atrial level shunt detected by color flow Doppler.  LEFT VENTRICLE PLAX 2D LVIDd:         4.77 cm  Diastology LVIDs:         2.99 cm  LV e' medial:    10.00 cm/s LV PW:         1.06 cm  LV E/e' medial:  9.4 LV IVS:        0.77 cm  LV e' lateral:   9.46 cm/s LVOT diam:     2.00 cm  LV E/e' lateral: 10.0 LV SV:         69 LV SV Index:   31 LVOT Area:     3.14 cm  RIGHT VENTRICLE RV Basal diam:  4.61 cm RV S prime:  13.50 cm/s TAPSE (M-mode): 4.6 cm LEFT ATRIUM             Index       RIGHT ATRIUM           Index LA diam:        4.00 cm 1.81 cm/m  RA Area:     16.80 cm LA Vol (A2C):   50.3 ml 22.82 ml/m RA Volume:   42.70 ml  19.37 ml/m LA Vol (A4C):   31.1 ml 14.11 ml/m LA Biplane Vol: 42.3 ml 19.19 ml/m  AORTIC VALVE                   PULMONIC VALVE AV Area (Vmax):    2.65 cm    PV Vmax:        0.79 m/s AV Area (Vmean):   2.33 cm    PV Peak grad:   2.5 mmHg AV Area (VTI):     2.48 cm    RVOT Peak grad: 3 mmHg AV Vmax:           135.00 cm/s AV Vmean:          89.600 cm/s AV VTI:            0.280 m AV Peak Grad:      7.3 mmHg AV Mean Grad:      3.5 mmHg LVOT Vmax:         114.00 cm/s LVOT Vmean:        66.500 cm/s LVOT VTI:          0.221 m LVOT/AV VTI ratio: 0.79  AORTA Ao Root diam: 2.85 cm MITRAL VALVE               TRICUSPID VALVE MV Area (PHT): 3.27 cm    TR Peak grad:   23.4 mmHg MV Decel Time: 232 msec    TR Vmax:        242.00 cm/s MV E velocity: 94.30 cm/s MV A velocity: 78.80 cm/s  SHUNTS MV E/A ratio:  1.20        Systemic VTI:  0.22 m                            Systemic Diam: 2.00 cm Kathlyn Sacramento MD Electronically signed by Kathlyn Sacramento  MD Signature Date/Time: 02/24/2021/12:22:31 PM    Final      Assessment and Plan:   1.  Palpitations: Likely due to frequent PVCs and short runs of nonsustained ventricular tachycardia.  This was in the setting of excessive sugar and alcohol intake.  No evidence of an ischemic etiology as her troponin is normal.  Echocardiogram showed normal LV systolic function and no significant valvular abnormalities.  She does not require any antiarrhythmic medication at the present time.  She continues to have mild PVC burden now and also has intermittent second-degree AV block.  Thus, I recommend a 2-week outpatient monitor and then follow-up with Dr. Saunders Revel.  I discussed with her the importance of healthy diet.  I advised her to cut down on alcohol intake.  2.  Anxiety: Currently on SSRI and hydroxyzine.  This might be contributing to her arrhythmia as well.  I discussed with Dr. Posey Pronto.  The patient can be discharged home with plans for outpatient monitor.   For questions or updates, please contact [place] Please consult [email or web address] for contact info under    Signed, Arvil Chaco, PA-C  02/24/2021 1:23 PM

## 2021-02-24 NOTE — H&P (Signed)
History and Physical    Haley Deleon:[id number] DOB: Jan 02, 1956 DOA: 02/24/2021  PCP: [facility] [place]   Patient coming from: Home.  Chief Complaint: Palpitations.  HPI: Haley Deleon is a 65 y.o. female with history of ulcerative colitis status post total colectomy, history of diastolic dysfunction per 2D echo done in 2019 presents to the ER after patient has been having sudden onset of palpitations after she went to bed.  Patient states she has had palpitation off and on very briefly which did not bother her but last night after going to bed she started having persistent palpitations.  Since her daughter's death in 05/18/2020 patient has been drinking alcohol every day and last night patient had some tequila at [employer] and went to bed.  Patient has not had any shortness of breath or chest pain.  ED Course: In the ER patient was found to have multiple runs of premature atrial complexes and nonsustained V. tach.  Patient was initially given IV metoprolol despite which patient still had abnormal rhythms.  ER physician discussed with on-call cardiologist Dr. Lovena Le who advised patient to be placed on amiodarone.  High sensitive troponin was negative and TSH was 6.85.  COVID test is pending.  Review of Systems: As per HPI, rest all negative.   Past Medical History:  Diagnosis Date  . Anemia   . Atypical chest pain   . Carotid arterial disease ([place])    a. 11/2017 Carotid U/S: RICA <81, LICA nl.  . Claustrophobia    NO MEDS  . Diastolic dysfunction    a. 11/2017 Echo: EF 60-65%, no rwma, Gr1 DD. Nl RV fxn.  . Family history of adverse reaction to anesthesia    1ST COUSIN-WAKE UP DURING SURGERY  . Liver cyst   . Ulcerative colitis ([place])   . Venous insufficiency     Past Surgical History:  Procedure Laterality Date  . BREAST BIOPSY Right 11/02/2011   ultrasound guided biopsy/clip-benign  . CHOLECYSTECTOMY N/A 07/29/2017   Procedure: LAPAROSCOPIC  CHOLECYSTECTOMY;  Surgeon: Jules Husbands, MD;  Location: [facility] ORS;  Service: General;  Laterality: N/A;  . COLON SURGERY  2014, 2015   DUE TO ULERATIVE COLITIS WITH COLOSTOMY  . COLOSTOMY TAKEDOWN    . FOOT SURGERY Bilateral    BONE SPURS   . MANDIBLE FRACTURE SURGERY  1991  . MOUTH SURGERY    . VENTRAL HERNIA REPAIR N/A 07/29/2017   Procedure: LAPAROSCOPIC VENTRAL HERNIA;  Surgeon: Jules Husbands, MD;  Location: [facility] ORS;  Service: General;  Laterality: N/A;     reports that she quit smoking about 15 years ago. Her smoking use included cigarettes. She has a 20.00 pack-year smoking history. She has never used smokeless tobacco. She reports current alcohol use. She reports that she does not use drugs.  Allergies  Allergen Reactions  . Caffeine Anaphylaxis  . Chocolate Anaphylaxis  . Chocolate Flavor Anaphylaxis  . Cocoa Anaphylaxis  . Meloxicam     Family History  Problem Relation Age of Onset  . Heart attack Mother 52  . Hyperlipidemia Brother   . Breast cancer Neg Hx     Prior to Admission medications   Medication Sig Start Date End Date Taking? Authorizing Provider  acetaminophen (TYLENOL) 325 MG tablet Take by mouth. 02/24/14   [provider]  escitalopram (LEXAPRO) 10 MG tablet Take 10 mg by mouth daily.    [provider]  ferrous sulfate 325 (65 FE) MG  tablet Take 325 mg by mouth daily as needed.     [provider]  hydrOXYzine (ATARAX/VISTARIL) 10 MG tablet Take 10 mg by mouth 3 (three) times daily as needed. 10/03/20   [provider]  loperamide (IMODIUM) 2 MG capsule Take 2 mg as needed by mouth for diarrhea or loose stools (NORMALLY TAKES 2-3 TIMES DAILY SINCE HAVING HER COLON SURGERY).     [provider]  Multiple Vitamin (MULTIVITAMIN) tablet Take 1 tablet by mouth daily.    [provider]    Physical Exam: Constitutional: Moderately built and nourished. Vitals:   02/24/21 0239 02/24/21 0242 02/24/21 0302  02/24/21 0330  BP: 130/67  131/76 120/63  Pulse: (!) 103   91  Resp: 18  13 16   Temp: 98.4 F (36.9 C)     TempSrc: Oral     SpO2: 96%   100%  Weight:  111.1 kg    Height:  5' 7"  (1.702 m)     Eyes: Anicteric no pallor. ENMT: No discharge from the ears eyes nose and mouth. Neck: No mass felt.  No neck rigidity. Respiratory: No rhonchi or crepitations. Cardiovascular: S1-S2 heard. Abdomen: Soft nontender bowel sounds present. Musculoskeletal: No edema. Skin: No rash. Neurologic: Alert awake oriented to time place and person.  Moves all extremities. Psychiatric: Appears normal.  Normal affect.   Labs on Admission: I have personally reviewed following labs and imaging studies  CBC: Recent Labs  Lab 02/24/21 0245  WBC 7.2  HGB 15.4*  HCT 47.1*  MCV 93.5  PLT 947   Basic Metabolic Panel: Recent Labs  Lab 02/24/21 0245  NA 140  K 3.8  CL 104  CO2 27  GLUCOSE 183*  BUN 15  CREATININE 0.72  CALCIUM 8.8*  MG 2.0   GFR: Estimated Creatinine Clearance: 91.3 mL/min (by C-G formula based on SCr of 0.72 mg/dL). Liver Function Tests: No results for input(s): AST, ALT, ALKPHOS, BILITOT, PROT, ALBUMIN in the last 168 hours. No results for input(s): LIPASE, AMYLASE in the last 168 hours. No results for input(s): AMMONIA in the last 168 hours. Coagulation Profile: No results for input(s): INR, PROTIME in the last 168 hours. Cardiac Enzymes: No results for input(s): CKTOTAL, CKMB, CKMBINDEX, TROPONINI in the last 168 hours. BNP (last 3 results) No results for input(s): PROBNP in the last 8760 hours. HbA1C: No results for input(s): HGBA1C in the last 72 hours. CBG: No results for input(s): GLUCAP in the last 168 hours. Lipid Profile: No results for input(s): CHOL, HDL, LDLCALC, TRIG, CHOLHDL, LDLDIRECT in the last 72 hours. Thyroid Function Tests: Recent Labs    02/24/21 0245  TSH 6.857*   Anemia Panel: No results for input(s): VITAMINB12, FOLATE, FERRITIN, TIBC,  IRON, RETICCTPCT in the last 72 hours. Urine analysis:    Component Value Date/Time   COLORURINE YELLOW (A) 07/15/2017 1135   APPEARANCEUR CLEAR (A) 07/15/2017 1135   APPEARANCEUR Clear 02/23/2014 2251   LABSPEC 1.018 07/15/2017 1135   LABSPEC 1.016 02/23/2014 2251   PHURINE 5.0 07/15/2017 1135   GLUCOSEU NEGATIVE 07/15/2017 1135   GLUCOSEU Negative 02/23/2014 2251   HGBUR NEGATIVE 07/15/2017 1135   BILIRUBINUR NEGATIVE 07/15/2017 1135   BILIRUBINUR Negative 02/23/2014 2251   KETONESUR NEGATIVE 07/15/2017 1135   PROTEINUR NEGATIVE 07/15/2017 1135   NITRITE NEGATIVE 07/15/2017 1135   LEUKOCYTESUR NEGATIVE 07/15/2017 1135   LEUKOCYTESUR Trace 02/23/2014 2251   Sepsis Labs: @LABRCNTIP (procalcitonin:4,lacticidven:4) )No results found for this or any previous visit (from the past  240 hour(s)).   Radiological Exams on Admission: No results found.  EKG: Independently reviewed.  Sinus tachycardia with PACs.  Assessment/Plan Principal Problem:   Palpitations Active Problems:   NSVT (nonsustained ventricular tachycardia) (HCC)    1. Palpitation with multiple runs of NSVT and PACs for which ER physician initially given IV metoprolol despite which the rhythm persisted and discussed with cardiology who recommended patient being started on amiodarone drip.  We will check 2D echo and follow further recommendations per cardiology. 2. Alcohol abuse -advised about quitting.  On CIWA. 3. Grief/depression with anxiety on Lexapro and as needed hydroxyzine. 4. History of ulcerative colitis status post total colectomy. 5. Mild hyperglycemia -check hemoglobin A1c with next blood draw.  Since patient has multiple runs of NSVT's and PACs and has been started on amiodarone infusion will need close monitoring and inpatient status.   DVT prophylaxis: Lovenox. Code Status: Full code. Family Communication: Discussed with patient. Disposition Plan: Home when stable. Consults called:  Cardiologist. Admission status: Inpatient   Rise Patience MD [facility] Pager [phone].  If 7PM-7AM, please contact night-coverage [email or web address] Password [facility]  02/24/2021, 4:49 AM

## 2021-02-24 NOTE — Progress Notes (Signed)
PHARMACIST - PHYSICIAN COMMUNICATION  CONCERNING:  Enoxaparin (Lovenox) for DVT Prophylaxis    RECOMMENDATION: Patient was prescribed enoxaprin 26m q24 hours for VTE prophylaxis.   Filed Weights   02/24/21 0242  Weight: 111.1 kg (245 lb)    Body mass index is 38.37 kg/m.  Estimated Creatinine Clearance: 91.3 mL/min (by C-G formula based on SCr of 0.72 mg/dL).   Based on [place]patient is candidate for enoxaparin 0.531mkg TBW SQ every 24 hours based on BMI being >30.  DESCRIPTION: Pharmacy has [facility]aparin dose [facility].  Patient is now receiving enoxaparin 0.5 mg/kg every 24 hours   NaRenda RollsPharmD, MBCascade Surgery Center LLC/02/2021 4:54 AM

## 2021-02-24 NOTE — ED Notes (Signed)
Pt is ambulatory to the bathroom at this time.

## 2021-02-24 NOTE — ED Provider Notes (Signed)
[facility] Emergency Department Provider Note  ____________________________________________   Event Date/Time   First MD Initiated Contact with Patient 02/24/21 0258     (approximate)  I have reviewed the triage vital signs and the nursing notes.   HISTORY  Chief Complaint Palpitations    HPI Haley Deleon is a 65 y.o. female with history of ulcerative colitis, diastolic dysfunction who presents to the emergency department with palpitations tonight.  States she felt her heart was racing, skipping beats.  No chest pain or chest discomfort, shortness of breath, nausea, vomiting, diarrhea, bloody stools or melena, fever, diaphoresis, dizziness.  States she does not drink caffeine.  No illicit drug use.  No other over-the-counter stimulant use.  No known history of thyroid dysfunction.  States she has been drinking 2-3 alcoholic beverages every day since August of last year when her daughter died.  Also reports that she has not been eating well, reporting that she eats a bag of [employer] cups every day.        Past Medical History:  Diagnosis Date  . Anemia   . Atypical chest pain   . Carotid arterial disease ([place])    a. 11/2017 Carotid U/S: RICA <46, LICA nl.  . Claustrophobia    NO MEDS  . Diastolic dysfunction    a. 11/2017 Echo: EF 60-65%, no rwma, Gr1 DD. Nl RV fxn.  . Family history of adverse reaction to anesthesia    1ST COUSIN-WAKE UP DURING SURGERY  . Liver cyst   . Ulcerative colitis ([name])   . Venous insufficiency     Patient Active Problem List   Diagnosis Date Noted  . Palpitations 02/24/2021  . 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 Varicose veins of leg with pain, left 06/14/2020  . Dizziness 11/17/2017  . Murmur, cardiac 11/17/2017  . Screening for cardiovascular condition 11/17/2017  . Lower extremity edema 11/17/2017  . Gallstones and inflammation of gallbladder without obstruction   . Incisional  hernia, without obstruction or gangrene   . History of biliary T-tube placement 02/24/2014  . Cellulitis 02/24/2014  . Cellulitis and abscess 02/24/2014  . Ulcerative colitis ([name]) 08/22/2013    Past Surgical History:  Procedure Laterality Date  . BREAST BIOPSY Right 11/02/2011   ultrasound guided biopsy/clip-benign  . CHOLECYSTECTOMY N/A 07/29/2017   Procedure: LAPAROSCOPIC CHOLECYSTECTOMY;  Surgeon: Jules Husbands, MD;  Location: [facility] ORS;  Service: General;  Laterality: N/A;  . COLON SURGERY  2014, 2015   DUE TO ULERATIVE COLITIS WITH COLOSTOMY  . COLOSTOMY TAKEDOWN    . FOOT SURGERY Bilateral    BONE SPURS   . MANDIBLE FRACTURE SURGERY  1991  . MOUTH SURGERY    . VENTRAL HERNIA REPAIR N/A 07/29/2017   Procedure: LAPAROSCOPIC VENTRAL HERNIA;  Surgeon: Jules Husbands, MD;  Location: [facility] ORS;  Service: General;  Laterality: N/A;    Prior to Admission medications   Medication Sig Start Date End Date Taking? Authorizing Provider  acetaminophen (TYLENOL) 325 MG tablet Take by mouth. 02/24/14   [provider]  escitalopram (LEXAPRO) 10 MG tablet Take 10 mg by mouth daily.    [provider]  ferrous sulfate 325 (65 FE) MG tablet Take 325 mg by mouth daily as needed.     [provider]  hydrOXYzine (ATARAX/VISTARIL) 10 MG tablet Take 10 mg by mouth 3 (three) times daily as needed. 10/03/20   [provider]  loperamide (IMODIUM) 2 MG capsule  Take 2 mg as needed by mouth for diarrhea or loose stools (NORMALLY TAKES 2-3 TIMES DAILY SINCE HAVING HER COLON SURGERY).     [provider]  Multiple Vitamin (MULTIVITAMIN) tablet Take 1 tablet by mouth daily.    [provider]    Allergies Caffeine, Chocolate, Chocolate flavor, Cocoa, and Meloxicam  Family History  Problem Relation Age of Onset  . Heart attack Mother 1  . Hyperlipidemia Brother   . Breast cancer Neg Hx     Social History Social History   Tobacco Use  . Smoking  status: Former Smoker    Packs/day: 1.00    Years: 20.00    Pack years: 20.00    Types: Cigarettes    Quit date: 07/28/2005    Years since quitting: 15.5  . Smokeless tobacco: Never Used  Vaping Use  . Vaping Use: Never used  Substance Use Topics  . Alcohol use: Yes    Comment: RARE WINE  . Drug use: No    Review of Systems Constitutional: No fever. Eyes: No visual changes. ENT: No sore throat. Cardiovascular: Denies chest pain. Respiratory: Denies shortness of breath. Gastrointestinal: No nausea, vomiting, diarrhea. Genitourinary: Negative for dysuria. Musculoskeletal: Negative for back pain. Skin: Negative for rash. Neurological: Negative for focal weakness or numbness.  ____________________________________________   PHYSICAL EXAM:  VITAL SIGNS: ED Triage Vitals  Enc Vitals Group     BP 02/24/21 0239 130/67     Pulse Rate 02/24/21 0239 (!) 103     Resp 02/24/21 0239 18     Temp 02/24/21 0239 98.4 F (36.9 C)     Temp Source 02/24/21 0239 Oral     SpO2 02/24/21 0239 96 %     Weight 02/24/21 0242 245 lb (111.1 kg)     Height 02/24/21 0242 5' 7"  (1.702 m)     Head Circumference --      Peak Flow --      Pain Score 02/24/21 0242 0     Pain Loc --      Pain Edu? --      Excl. in [place]? --    CONSTITUTIONAL: Alert and oriented and responds appropriately to questions. Well-appearing; well-nourished HEAD: Normocephalic EYES: Conjunctivae clear, pupils appear equal, EOM appear intact ENT: normal nose; moist mucous membranes NECK: Supple, normal ROM CARD: RRR; S1 and S2 appreciated; no murmurs, no clicks, no rubs, no gallops, frequent ectopy with PACs, PVCs, SVT and ventricular tachycardia seen on cardiac monitoring RESP: Normal chest excursion without splinting or tachypnea; breath sounds clear and equal bilaterally; no wheezes, no rhonchi, no rales, no hypoxia or respiratory distress, speaking full sentences ABD/GI: Normal bowel sounds; non-distended; soft,  non-tender, no rebound, no guarding, no peritoneal signs, no hepatosplenomegaly BACK: The back appears normal EXT: Normal ROM in all joints; no deformity noted, no edema; no cyanosis, no calf tenderness or calf swelling SKIN: Normal color for age and race; warm; no rash on exposed skin NEURO: Moves all extremities equally PSYCH: The patient's mood and manner are appropriate.  ____________________________________________   LABS (all labs ordered are listed, but only abnormal results are displayed)  Labs Reviewed  BASIC METABOLIC PANEL - Abnormal; Notable for the following components:      Result Value   Glucose, Bld 183 (*)    Calcium 8.8 (*)    All other components within normal limits  CBC - Abnormal; Notable for the following components:   Hemoglobin 15.4 (*)    HCT 47.1 (*)  All other components within normal limits  TSH - Abnormal; Notable for the following components:   TSH 6.857 (*)    All other components within normal limits  MAGNESIUM  ETHANOL  URINE DRUG SCREEN, QUALITATIVE ([facility] ONLY)  T4, FREE  TROPONIN I (HIGH SENSITIVITY)  TROPONIN I (HIGH SENSITIVITY)   ____________________________________________  EKG   EKG Interpretation  Date/Time:  Monday February 24 2021 02:35:26 EDT Ventricular Rate:  113 PR Interval:  148 QRS Duration: 80 QT Interval:  334 QTC Calculation: 458 R Axis:   38 Text Interpretation: Sinus tachycardia with Premature atrial complexes Low voltage QRS Cannot rule out Anterior infarct , age undetermined Abnormal ECG Confirmed by Pryor Curia [phone]) on 02/24/2021 3:31:49 AM       ____________________________________________  RADIOLOGY Jessie Foot Becky Colan, personally viewed and evaluated these images (plain radiographs) as part of my medical decision making, as well as reviewing the written report by the radiologist.  ED MD interpretation:    Official radiology report(s): No results  found.  ____________________________________________   PROCEDURES  Procedure(s) performed (including Critical Care):  Procedures  CRITICAL CARE Performed by: Pryor Curia   Total critical care time: 45 minutes  Critical care time was exclusive of separately billable procedures and treating other patients.  Critical care was necessary to treat or prevent imminent or life-threatening deterioration.  Critical care was time spent personally by me on the following activities: development of treatment plan with patient and/or surrogate as well as nursing, discussions with consultants, evaluation of patient's response to treatment, examination of patient, obtaining history from patient or surrogate, ordering and performing treatments and interventions, ordering and review of laboratory studies, ordering and review of radiographic studies, pulse oximetry and re-evaluation of patient's condition.  ____________________________________________   INITIAL IMPRESSION / ASSESSMENT AND PLAN / ED COURSE  As part of my medical decision making, I reviewed the following data within the [place] notes reviewed and incorporated, Labs reviewed , EKG interpreted , Old chart reviewed, Discussed with admitting physician , A consult was requested and obtained from this/these consultant(s) Cardiology and Notes from prior ED visits         Patient here with palpitations.  History of recent increased alcohol use over the past year.  We will check electrolytes, hemoglobin, troponin, TSH today.  She is having intermittent runs of PACs, PVCs, SVT and ventricular tachycardia.  She is symptomatic during these episodes.  Will give IV fluids, metoprolol and reassess.  ED PROGRESS  4:20 AM  Metoprolol has not caused any improvement in her palpitations or different arrhythmias.  Still seeing frequent PACs, PVCs, SVT and ventricular tachycardia.  Longest run of ventricular tachycardia around  8-10 beats seen on cardiac monitoring.  Discussed with Dr. Lovena Le on-call for cardiology who agrees with starting amiodarone infusion.  Will discuss with hospitalist for admission.  Hemoglobin today is 15 suggestive of hemoconcentration.  Electrolytes normal.  TSH is actually elevated.  Drug screen pending.  Ethanol level negative.  Patient did have 1 slightly low blood pressure in the upper 82C systolic.  Immediately on recheck it is 116/77.  We will continue to closely monitor her blood pressure but will hold off on amiodarone bolus given this may contribute to further hypotension.  4:26 AM Discussed patient's case with hospitalist, Dr. Hal Hope.  I have recommended admission and patient (and family if present) agree with this plan. Admitting physician will place admission orders.   I reviewed all nursing notes, vitals, pertinent previous records  and reviewed/interpreted all EKGs, lab and urine results, imaging (as available).   ____________________________________________   FINAL CLINICAL IMPRESSION(S) / ED DIAGNOSES  Final diagnoses:  Palpitations  Premature atrial contractions  Premature ventricular contractions  Ventricular tachycardia (HCC)  SVT (supraventricular tachycardia) [facility])     ED Discharge Orders    None      *Please note:  Haley Deleon was evaluated in Emergency Department on 02/24/2021 for the symptoms described in the history of present illness. She was evaluated in the context of the global COVID-19 pandemic, which necessitated consideration that the patient might be at risk for infection with the SARS-CoV-2 virus that causes COVID-19. Institutional protocols and algorithms that pertain to the evaluation of patients at risk for COVID-19 are in a state of rapid change based on information released by regulatory bodies including the CDC and federal and state organizations. These policies and algorithms were followed during the patient's care in the ED.  Some ED  evaluations and interventions may be delayed as a result of limited staffing during and the pandemic.*   Note:  This document was prepared using Dragon voice recognition software and may include unintentional dictation errors.   Acacia Latorre, Delice Bison, DO 02/24/21 [phone]

## 2021-02-24 NOTE — ED Notes (Signed)
Patient reports she woke up with her heart racing and then came to the ED.  Patient reports being under some increased stress recently.  Patient is awake, alert and oriented.

## 2021-02-24 NOTE — Discharge Instructions (Signed)
Patient is instructed she will be getting Zio monitor mail at home from cardiology office.

## 2021-02-24 NOTE — Progress Notes (Signed)
*  PRELIMINARY RESULTS* Echocardiogram 2D Echocardiogram has been performed.  Sherrie Sport 02/24/2021, 11:03 AM

## 2021-02-25 ENCOUNTER — Telehealth: Payer: Self-pay

## 2021-02-25 ENCOUNTER — Ambulatory Visit (INDEPENDENT_AMBULATORY_CARE_PROVIDER_SITE_OTHER): Payer: Federal, State, Local not specified - PPO

## 2021-02-25 DIAGNOSIS — I493 Ventricular premature depolarization: Secondary | ICD-10-CM

## 2021-02-25 DIAGNOSIS — I4729 Other ventricular tachycardia: Secondary | ICD-10-CM

## 2021-02-25 DIAGNOSIS — I472 Ventricular tachycardia: Secondary | ICD-10-CM

## 2021-02-25 NOTE — Addendum Note (Signed)
Addended by: Wynema Birch on: 02/25/2021 05:02 PM   Modules accepted: Orders

## 2021-02-25 NOTE — Telephone Encounter (Signed)
Patient contacted regarding discharge from [facility] on 02/24/2021.  Patient understands to follow up with provider Dr. Saunders Revel on 03/20/21 at 10:40am at [facility]. Patient understands discharge instructions? Yes Patient understands medications and regiment? Yes Patient understands to bring all medications to this visit? Yes  Pt is enrolled to MyChart  Did speak with pt regarding Zio monitor per [name], PA-C  This patient was admitted and seen at [facility] for palpitations.   We are expecting discharge today 02/24/2021.   1 ) Can you please mail the patient a 2-week ZIO XT monitor for palpitations with NSVT/PVC seen on telemetry? We were unable to place one before discharge in the hospital.   2) Can you please call and arrange / schedule for follow-up TCM appointment with Dr. Saunders Revel or APP within the next 1 to 2 months to follow-up after cardiac monitoring?   Haley Deleon agrees to wear monitor, understands to wear for 2 weeks (14 days). Monitor will be mailed to her, hopefully results will be in before her upcoming appt on 6/30. She reports feeling much better, stated she thinks "this could have been from something I ate that was spicy, I feel so much better now, but I will try the monitor just to see".   Zio instrutions reviewed with pt 1. Avoid showering during the first 24 hours of wearing the monitor. 2. Avoid excessive sweating to help maximize wear time. 3. Do not submerge the device, no hot tubs, and no swimming pools. 4. Keep any lotions or oils away from the patch. 5. After 24 hours you may shower with the patch on. Take brief showers with your back facing the shower head.  6. Do not remove patch once it has been placed because that will interrupt data and decrease adhesive wear time. 7. Push the button when you have any symptoms and write down what you were feeling. 8. Once you have completed wearing your monitor, remove and place into box which has postage paid and place in  your outgoing mailbox.  9. If for some reason you have misplaced your box then call our office and we can provide another box and/or mail it off for you. Advised for any trouble shooitngio to call the 1-800 number on the box.   Zio monitor has been order  Pt is thankful for the return phone call, will call back with any further questions or concern before her upcoming appt.

## 2021-02-25 NOTE — Telephone Encounter (Signed)
Attempted to reach out to for a TOC after her recent admission to hospital, unable to make contact via phone, no DPR, advised pt to call back.   Visser, Jacquelyn D, PA-C  P C[facility] [place] Triage Hello,   This patient was admitted and seen at [facility] for palpitations.   We are expecting discharge today 02/24/2021.   1 ) Can you please mail the patient a 2-week ZIO XT monitor for palpitations with NSVT/PVC seen on telemetry? We were unable to place one before discharge in the hospital.   2) Can you please call and arrange / schedule for follow-up TCM appointment with Dr. Saunders Revel or APP within the next 1 to 2 months to follow-up after cardiac monitoring?

## 2021-03-05 DIAGNOSIS — I472 Ventricular tachycardia: Secondary | ICD-10-CM | POA: Diagnosis not present

## 2021-03-05 DIAGNOSIS — I493 Ventricular premature depolarization: Secondary | ICD-10-CM | POA: Diagnosis not present

## 2021-03-07 ENCOUNTER — Ambulatory Visit (INDEPENDENT_AMBULATORY_CARE_PROVIDER_SITE_OTHER): Payer: Federal, State, Local not specified - PPO | Admitting: Nurse Practitioner

## 2021-03-20 ENCOUNTER — Ambulatory Visit: Payer: Federal, State, Local not specified - PPO | Admitting: Internal Medicine

## 2021-04-02 ENCOUNTER — Ambulatory Visit (INDEPENDENT_AMBULATORY_CARE_PROVIDER_SITE_OTHER): Payer: Federal, State, Local not specified - PPO | Admitting: Nurse Practitioner

## 2021-04-16 ENCOUNTER — Telehealth: Payer: Self-pay | Admitting: Physician Assistant

## 2021-04-16 NOTE — Telephone Encounter (Signed)
Called to give the patient monitor results. Unable to lmom. Pt phone rings out with no voicemail option.

## 2021-04-16 NOTE — Telephone Encounter (Signed)
Arvil Chaco, PA-C  04/15/2021  1:31 PM EDT Back to Top     Forwarding to provider that we will see at office follow-up.   Predominantly in a normal heart rhythm. Rare extra beats from the top and bottom chambers (PACs, PVCs). Brief 5 beats of a faster rhythm from the bottom chambers of the heart. Breif 7 beats from a faster heart rhythm from the top chambers of the heart. Overall, reassuring findings with no sustained arrhythmia or prolonged pausesobserved.

## 2021-04-18 NOTE — Telephone Encounter (Signed)
Patient made aware of zio monitor results with verbalized understanding with verbalized understanding. Patient sts that she had a death in her family and will need to reschedule her 04/24/21 appt with [facility]. Appt rescheduled for 05/29/21 @ 3:30pm. Patient aware of the reschedule date and time.

## 2021-04-18 NOTE — Telephone Encounter (Signed)
Patient returning call for results 

## 2021-04-18 NOTE — Telephone Encounter (Signed)
Attempted to call pt. No answer. Lmtcb.  

## 2021-04-24 ENCOUNTER — Ambulatory Visit: Payer: Federal, State, Local not specified - PPO | Admitting: Medical

## 2021-04-30 ENCOUNTER — Ambulatory Visit (INDEPENDENT_AMBULATORY_CARE_PROVIDER_SITE_OTHER): Payer: Federal, State, Local not specified - PPO | Admitting: Nurse Practitioner

## 2021-05-28 ENCOUNTER — Ambulatory Visit (INDEPENDENT_AMBULATORY_CARE_PROVIDER_SITE_OTHER): Payer: Federal, State, Local not specified - PPO | Admitting: Nurse Practitioner

## 2021-05-29 ENCOUNTER — Ambulatory Visit: Payer: Federal, State, Local not specified - PPO | Admitting: Medical

## 2021-06-25 ENCOUNTER — Ambulatory Visit (INDEPENDENT_AMBULATORY_CARE_PROVIDER_SITE_OTHER): Payer: Federal, State, Local not specified - PPO | Admitting: Nurse Practitioner

## 2021-07-16 ENCOUNTER — Other Ambulatory Visit: Payer: Self-pay

## 2021-07-16 ENCOUNTER — Encounter: Payer: Self-pay | Admitting: Internal Medicine

## 2021-07-16 ENCOUNTER — Ambulatory Visit: Payer: Federal, State, Local not specified - PPO | Admitting: Internal Medicine

## 2021-07-16 VITALS — BP 136/90 | HR 92 | Ht 67.5 in | Wt 281.0 lb

## 2021-07-16 DIAGNOSIS — R002 Palpitations: Secondary | ICD-10-CM | POA: Diagnosis not present

## 2021-07-16 DIAGNOSIS — R030 Elevated blood-pressure reading, without diagnosis of hypertension: Secondary | ICD-10-CM

## 2021-07-16 DIAGNOSIS — I471 Supraventricular tachycardia: Secondary | ICD-10-CM

## 2021-07-16 DIAGNOSIS — I4729 Other ventricular tachycardia: Secondary | ICD-10-CM

## 2021-07-16 DIAGNOSIS — R600 Localized edema: Secondary | ICD-10-CM | POA: Diagnosis not present

## 2021-07-16 NOTE — Patient Instructions (Signed)

## 2021-07-16 NOTE — Progress Notes (Signed)
Follow-up Outpatient Visit Date: 07/16/2021  Primary Care Provider: [facility] [place] [id number]  Chief Complaint: Follow-up palpitations and leg swelling  HPI:  Haley Deleon is a 65 y.o. female with history of ulcerative colitis, cholecystitis, and incisional hernia, who presents for follow-up of palpitations and leg swelling.  She was seen in the [facility] ED in early June for evaluation of palpitations.  She was noted to have frequent PAC's, PVC's, PSVT, and NSVT on telemetry in the setting of excess sugar and alcohol consumption prior to presentation.  She was started on IV amiodarone per Dr. Tanna Furry recommendation.  Overnight, Mobitz type 1 second degree AV block was noted.  Echo was unremarkable.  14-day event monitor showed predominantly sinus rhythm with rare PAC's, PVC's and a few brief episodes of NSVT and PSVT.  Today, Haley Deleon reports that she has continued to struggle with anxiety and stress stemming from her daughter's death in June 11, 2020, though her anxiety is improving.  She notes having put on quite a bit of weight over the last 6 months due to dietary indiscretion and inactivity.  She denies chest pain, shortness of breath, palpitations, or lightheadedness since her ED visit in June.  She notes mild swelling in her legs though this has improved with vein therapies.  She denies having been tested for sleep apnea in the past but does not think that she has sleep apnea.  --------------------------------------------------------------------------------------------------  Past Medical History:  Diagnosis Date   Anemia    Atypical chest pain    Carotid arterial disease ([place])    a. 11/2017 Carotid U/S: RICA <49, LICA nl.   Claustrophobia    NO MEDS   Diastolic dysfunction    a. 11/2017 Echo: EF 60-65%, no rwma, Gr1 DD. Nl RV fxn.   Family history of adverse reaction to anesthesia    1ST COUSIN-WAKE UP DURING SURGERY   Liver  cyst    Ulcerative colitis ([place])    Venous insufficiency    Past Surgical History:  Procedure Laterality Date   BREAST BIOPSY Right 11/02/2011   ultrasound guided biopsy/clip-benign   CHOLECYSTECTOMY N/A 07/29/2017   Procedure: LAPAROSCOPIC CHOLECYSTECTOMY;  Surgeon: Jules Husbands, MD;  Location: [facility] ORS;  Service: General;  Laterality: N/A;   COLON SURGERY  2014, 2015   DUE TO ULERATIVE COLITIS WITH COLOSTOMY   COLOSTOMY TAKEDOWN     FOOT SURGERY Bilateral    BONE SPURS    MANDIBLE FRACTURE SURGERY  1991   MOUTH SURGERY     VENTRAL HERNIA REPAIR N/A 07/29/2017   Procedure: LAPAROSCOPIC VENTRAL HERNIA;  Surgeon: Jules Husbands, MD;  Location: [facility] ORS;  Service: General;  Laterality: N/A;    Current Meds  Medication Sig   escitalopram (LEXAPRO) 10 MG tablet Take 10 mg by mouth daily.   ferrous sulfate 325 (65 FE) MG tablet Take 325 mg by mouth daily as needed.    hydrOXYzine (ATARAX/VISTARIL) 10 MG tablet Take 10 mg by mouth daily.   loperamide (IMODIUM) 2 MG capsule Take 2 mg as needed by mouth for diarrhea or loose stools (NORMALLY TAKES 2-3 TIMES DAILY SINCE HAVING HER COLON SURGERY).    Multiple Vitamin (MULTIVITAMIN) tablet Take 1 tablet by mouth daily.    Allergies: Caffeine, Chocolate, Chocolate flavor, Cocoa, and Meloxicam  Social History   Tobacco Use   Smoking status: Former    Packs/day: 1.00    Years: 20.00    Pack years: 20.00  Types: Cigarettes    Quit date: 07/28/2005    Years since quitting: 15.9   Smokeless tobacco: Never  Vaping Use   Vaping Use: Never used  Substance Use Topics   Alcohol use: Yes    Comment: RARE WINE   Drug use: No    Family History  Problem Relation Age of Onset   Heart attack Mother 50   Hyperlipidemia Brother    Breast cancer Neg Hx     Review of Systems: A 12-system review of systems was performed and was negative except as noted in the  HPI.  --------------------------------------------------------------------------------------------------  Physical Exam: BP 136/90 (BP Location: Left Arm, Patient Position: Sitting, Cuff Size: Large)   Pulse 92   Ht 5' 7.5" (1.715 m)   Wt 281 lb (127.5 kg)   SpO2 97%   BMI 43.36 kg/m  Repeat BP: 132/90  General:  NAD. Neck: Unable to assess JVP due to body habitus. Lungs: Clear to auscultation bilaterally without wheezes or crackles. Heart: Distant heart sounds.  Regular rate and rhythm without murmurs, rubs, or gallops. Abdomen: Soft, nontender, nondistended. Extremities: Trace pretibial.  EKG: Normal sinus rhythm without abnormality.  Lab Results  Component Value Date   WBC 7.2 02/24/2021   HGB 15.4 (H) 02/24/2021   HCT 47.1 (H) 02/24/2021   MCV 93.5 02/24/2021   PLT 235 02/24/2021    Lab Results  Component Value Date   NA 140 02/24/2021   K 3.8 02/24/2021   CL 104 02/24/2021   CO2 27 02/24/2021   BUN 15 02/24/2021   CREATININE 0.72 02/24/2021   GLUCOSE 183 (H) 02/24/2021   ALT 16 07/15/2017    Lab Results  Component Value Date   CHOL 149 12/16/2017   HDL 75 12/16/2017   LDLCALC 63 12/16/2017   TRIG 54 12/16/2017   CHOLHDL 2.0 12/16/2017    --------------------------------------------------------------------------------------------------  ASSESSMENT AND PLAN: Palpitations with PSVT and NSVT: Haley Deleon believes that anxiety and dietary indiscretion (excess salt and sugar intake) led to her palpitations with frequent ectopy in June.  She has not had any recurrence since that time.  Echocardiogram did not show any significant structural abnormalities.  We discussed ischemia evaluation but have agreed to defer this given absence of further symptoms.  Lower extremity edema: Trace pretibial edema noted today, which has improved from prior visits.  Haley Deleon should continue following with her vein specialist for management of her venous insufficiency.   Weight loss encouraged.  Elevated blood pressure: Blood pressure borderline elevated today.  I encouraged Haley Deleon to keep working on lifestyle modifications including weight loss and sodium restriction.  Follow-up: Return to clinic in 6 months.  Nelva Bush, MD 07/16/2021 4:39 PM

## 2021-07-17 ENCOUNTER — Encounter: Payer: Self-pay | Admitting: Internal Medicine

## 2021-07-17 DIAGNOSIS — I471 Supraventricular tachycardia: Secondary | ICD-10-CM | POA: Insufficient documentation

## 2021-07-30 ENCOUNTER — Ambulatory Visit (INDEPENDENT_AMBULATORY_CARE_PROVIDER_SITE_OTHER): Payer: Federal, State, Local not specified - PPO | Admitting: Nurse Practitioner

## 2021-08-27 ENCOUNTER — Ambulatory Visit (INDEPENDENT_AMBULATORY_CARE_PROVIDER_SITE_OTHER): Payer: Federal, State, Local not specified - PPO | Admitting: Nurse Practitioner

## 2021-09-24 ENCOUNTER — Ambulatory Visit (INDEPENDENT_AMBULATORY_CARE_PROVIDER_SITE_OTHER): Payer: Federal, State, Local not specified - PPO | Admitting: Nurse Practitioner

## 2021-10-15 ENCOUNTER — Other Ambulatory Visit: Payer: Self-pay

## 2021-10-15 ENCOUNTER — Ambulatory Visit (INDEPENDENT_AMBULATORY_CARE_PROVIDER_SITE_OTHER): Payer: Federal, State, Local not specified - PPO

## 2021-10-15 ENCOUNTER — Ambulatory Visit
Admission: EM | Admit: 2021-10-15 | Discharge: 2021-10-15 | Disposition: A | Discharge status: Home / Self Care | Payer: Federal, State, Local not specified - PPO

## 2021-10-15 ENCOUNTER — Encounter: Payer: Self-pay | Admitting: Emergency Medicine

## 2021-10-15 DIAGNOSIS — M25522 Pain in left elbow: Secondary | ICD-10-CM

## 2021-10-15 DIAGNOSIS — M25422 Effusion, left elbow: Secondary | ICD-10-CM

## 2021-10-15 NOTE — ED Triage Notes (Signed)
Pt has swelling, and pain on her left interior elbow. Started about 2 days ago. No redness.

## 2021-10-15 NOTE — ED Provider Notes (Addendum)
[facility]    CSN: [id number] Arrival date & time: 10/15/21  1324      History   Chief Complaint Chief Complaint  Patient presents with   Arm Swelling    left    HPI Haley Deleon is a 66 y.o. female.   HPI  Arm Swelling: For the past 3-4 days she has had swollen area. The area is sore and tender. No injury recently but 1 year ago had a bad bruise in this area from a fall. She has tried rest, ice for symptoms which did help some. No loss of sensation of hand, no skin color changes of the hands, no fevers.   Past Medical History:  Diagnosis Date   Anemia    Atypical chest pain    Carotid arterial disease ([place])    a. 11/2017 Carotid U/S: RICA <38, LICA nl.   Claustrophobia    NO MEDS   Diastolic dysfunction    a. 11/2017 Echo: EF 60-65%, no rwma, Gr1 DD. Nl RV fxn.   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, bilateral 07/09/2020   Varicose veins of leg with pain, left 06/14/2020   Dizziness 11/17/2017   Murmur, cardiac 11/17/2017   Screening for cardiovascular condition 11/17/2017   Lower extremity edema 11/17/2017   Gallstones and inflammation of gallbladder without obstruction    Incisional hernia, without obstruction or gangrene    History of biliary T-tube placement 02/24/2014   Cellulitis 02/24/2014   Cellulitis and abscess 02/24/2014   Ulcerative colitis ([place]) 08/22/2013    Past Surgical History:  Procedure Laterality Date   BREAST BIOPSY Right 11/02/2011   ultrasound guided biopsy/clip-benign   CHOLECYSTECTOMY N/A 07/29/2017   Procedure: LAPAROSCOPIC  CHOLECYSTECTOMY;  Surgeon: Jules Husbands, MD;  Location: [facility] ORS;  Service: General;  Laterality: N/A;   COLON SURGERY  2014, 2015   DUE TO ULERATIVE COLITIS WITH COLOSTOMY   COLOSTOMY TAKEDOWN     FOOT SURGERY Bilateral    BONE [place] N/A 07/29/2017   Procedure: LAPAROSCOPIC VENTRAL HERNIA;  Surgeon: Jules Husbands, MD;  Location: [facility] ORS;  Service: General;  Laterality: N/A;    OB History   No obstetric history on file.      Home Medications    Prior to Admission medications   Medication Sig Start Date End Date Taking? Authorizing Provider  ferrous sulfate 325 (65 FE) MG tablet Take 325 mg by mouth daily as needed.    Yes [provider]  FLUoxetine (PROZAC) 10 MG tablet Take 10 mg by mouth daily.   Yes [provider]  loperamide (IMODIUM) 2 MG capsule Take 2 mg as needed by mouth for diarrhea or loose stools (NORMALLY TAKES 2-3 TIMES DAILY SINCE HAVING HER COLON SURGERY).    Yes [provider]  Multiple Vitamin (MULTIVITAMIN) tablet Take 1 tablet by mouth daily.   Yes [provider]  escitalopram (LEXAPRO) 10 MG tablet Take 10 mg by mouth daily.    [provider]  hydrOXYzine (ATARAX) 10 MG tablet Take 10 mg by mouth daily.    [provider]    Family History Family History  Problem Relation Age of Onset   Heart attack Mother 57   Hyperlipidemia Brother    Breast cancer Neg Hx     Social History Social History   Tobacco Use   Smoking status: Former    Packs/day: 1.00    Years: 20.00    Pack years: 20.00    Types: Cigarettes    Quit date: 07/28/2005    Years since quitting: 16.2   Smokeless tobacco: Never  Vaping Use   Vaping Use: Never used  Substance Use Topics   Alcohol use: Yes    Comment: RARE WINE   Drug use: No     Allergies   Caffeine, Chocolate, Chocolate flavor, Cocoa, and Meloxicam   Review of  Systems Review of Systems  As stated above  Physical Exam Triage Vital Signs ED Triage Vitals  Enc Vitals Group     BP 10/15/21 1343 121/81     Pulse Rate 10/15/21 1343 (!) 109     Resp 10/15/21 1343 18     Temp 10/15/21 1343 98.2 F (36.8 C)     Temp Source 10/15/21 1343 Oral     SpO2 10/15/21 1343 99 %     Weight 10/15/21 1340 281 lb 1.4 oz (127.5 kg)     Height 10/15/21 1340 5' 7.5" (1.715 m)     Head Circumference --      Peak Flow --      Pain Score 10/15/21 1338 5     Pain Loc --      Pain Edu? --      Excl. in [place]? --    No data found.  Updated Vital Signs BP 121/81 (BP Location: Right Arm)    Pulse (!) 109    Temp 98.2 F (36.8 C) (Oral)    Resp 18    Ht 5' 7.5" (1.715 m)    Wt 281 lb 1.4 oz (127.5 kg)    SpO2 99%    BMI 43.37 kg/m   Physical Exam Vitals and nursing note reviewed.  Constitutional:      General: She is not in acute distress.    Appearance: Normal appearance. She is not ill-appearing, toxic-appearing or diaphoretic.  Cardiovascular:     Pulses: Normal pulses.  Musculoskeletal:        General: Swelling (inferior left elbow. very mild without induration) and tenderness (mild left anterior elbowvery mild) present. Normal range of motion.     Comments: No other swelling noted.   Skin:    General: Skin is warm.     Capillary Refill: Capillary refill takes less than 2 seconds.     Coloration: Skin is not pale.     Findings: No bruising, erythema, lesion or rash.  Neurological:     General: No focal deficit present.     Mental Status: She is alert and oriented to person, place, and time.     Sensory: No sensory deficit.     Motor: No weakness.     Coordination: Coordination normal.  Psychiatric:     Comments: Anxious appearing     UC Treatments / Results  Labs (all labs ordered are listed, but only abnormal results are displayed) Labs Reviewed - No data to display  EKG   Radiology No results found.  Procedures Procedures (including  critical care time)  Medications Ordered  in UC Medications - No data to display  Initial Impression / Assessment and Plan / UC Course  I have reviewed the triage vital signs and the nursing notes.  Pertinent labs & imaging results that were available during my care of the patient were reviewed by me and considered in my medical decision making (see chart for details).     New.  Given history of injury 1 year ago with what appears to be a localized inflammation I am going to assess via x ray to ensure no sign of gouty arthritis vs injury.  If x-ray is normal this likely is a form of bursitis.  Patient is concerned about a blood clot however she does not have any swelling of the lower arm and without any skin color changes or decreased pulses this is not likely however she could obtain an ultrasound in the emergency room for further work-up is that we do not have this available today in office.  Discussed red flag signs and symptoms along with cold compress and time and or Tylenol/Motrin.  Follow-up as needed. Final Clinical Impressions(s) / UC Diagnoses   Final diagnoses:  None   Discharge Instructions   None    ED Prescriptions   None    PDMP not reviewed this encounter.   Hughie Closs, PA-C 10/15/21 1421    Hughie Closs, PA-C 10/15/21 1439

## 2021-10-16 ENCOUNTER — Other Ambulatory Visit: Payer: Self-pay

## 2021-10-16 ENCOUNTER — Emergency Department
Admission: EM | Admit: 2021-10-16 | Discharge: 2021-10-16 | Disposition: A | Discharge status: Home / Self Care | Payer: Federal, State, Local not specified - PPO | Attending: Emergency Medicine | Admitting: Emergency Medicine

## 2021-10-16 ENCOUNTER — Emergency Department: Payer: Federal, State, Local not specified - PPO

## 2021-10-16 DIAGNOSIS — M79602 Pain in left arm: Secondary | ICD-10-CM | POA: Diagnosis present

## 2021-10-16 DIAGNOSIS — I809 Phlebitis and thrombophlebitis of unspecified site: Secondary | ICD-10-CM

## 2021-10-16 DIAGNOSIS — I10 Essential (primary) hypertension: Secondary | ICD-10-CM | POA: Insufficient documentation

## 2021-10-16 DIAGNOSIS — I82612 Acute embolism and thrombosis of superficial veins of left upper extremity: Secondary | ICD-10-CM | POA: Diagnosis not present

## 2021-10-16 LAB — CBC
HCT: 50.1 % — ABNORMAL HIGH (ref 36.0–46.0)
Hemoglobin: 15.6 g/dL — ABNORMAL HIGH (ref 12.0–15.0)
MCH: 28.4 pg (ref 26.0–34.0)
MCHC: 31.1 g/dL (ref 30.0–36.0)
MCV: 91.1 fL (ref 80.0–100.0)
Platelets: 272 10*3/uL (ref 150–400)
RBC: 5.5 MIL/uL — ABNORMAL HIGH (ref 3.87–5.11)
RDW: 13 % (ref 11.5–15.5)
WBC: 10.2 10*3/uL (ref 4.0–10.5)
nRBC: 0 % (ref 0.0–0.2)

## 2021-10-16 LAB — BASIC METABOLIC PANEL
Anion gap: 10 (ref 5–15)
BUN: 16 mg/dL (ref 8–23)
CO2: 25 mmol/L (ref 22–32)
Calcium: 9.1 mg/dL (ref 8.9–10.3)
Chloride: 103 mmol/L (ref 98–111)
Creatinine, Ser: 0.87 mg/dL (ref 0.44–1.00)
GFR, Estimated: >60 mL/min (ref >60)
Glucose, Bld: 164 mg/dL — ABNORMAL HIGH (ref 70–99)
Potassium: 4 mmol/L (ref 3.5–5.1)
Sodium: 138 mmol/L (ref 135–145)

## 2021-10-16 MED ORDER — IBUPROFEN 400 MG PO TABS
400.0000 mg | ORAL_TABLET | Freq: Once | ORAL | Status: AC
  Administered 2021-10-16: 400 mg via ORAL
  Filled 2021-10-16: qty 1

## 2021-10-16 NOTE — Discharge Instructions (Addendum)
I recommend 400 mg of ibuprofen as tolerated for pain.  I do not believe antibiotics are currently indicated but if she experience any worsening of symptoms he may need these in the future if you develop a cellulitis.  The mainstay of treatment for this is compression and elevation and warm compresses.  As we discussed please follow-up with PCP in 7 to 10 days and return immediately for any new or worsening of symptoms.

## 2021-10-16 NOTE — ED Provider Notes (Signed)
[facility] Provider Note    Event Date/Time   First MD Initiated Contact with Patient 10/16/21 1407     (approximate)   History   Arm Pain   HPI  Haley Deleon is a 66 y.o. female  with history of ulcerative colitis, cholecystitis, and incisional hernia status post colectomy who presents for assessment of some pain redness and swelling on the inside of the left elbow she noticed 2 or 3 days ago.  She does not recall any injuries or falls.  She has not had any fevers or pain or swelling in the wrist or shoulder.  She is never had a bursitis or gout.  She states she is most worried about a blood clot.  She went to her clinic yesterday and x-ray done it was normal but she could not get an ultrasound done to rule out blood clot.  No other acute concerns at this time.  No fevers, chest pain, cough, shortness of breath, abdominal pain, vomiting, diarrhea, rash or other areas of redness.      Physical Exam  Triage Vital Signs: ED Triage Vitals  Enc Vitals Group     BP 10/16/21 1345 (!) 161/104     Pulse Rate 10/16/21 1345 (!) 107     Resp 10/16/21 1345 20     Temp 10/16/21 1348 98.5 F (36.9 C)     Temp src --      SpO2 10/16/21 1345 99 %     Weight 10/16/21 1344 250 lb (113.4 kg)     Height 10/16/21 1344 5' 7.5" (1.715 m)     Head Circumference --      Peak Flow --      Pain Score 10/16/21 1344 6     Pain Loc --      Pain Edu? --      Excl. in [place]? --     Most recent vital signs: Vitals:   10/16/21 1345 10/16/21 1348  BP: (!) 161/104   Pulse: (!) 107   Resp: 20   Temp:  98.5 F (36.9 C)  SpO2: 99%     General: Awake, no distress.  CV:  Good peripheral perfusion.  2+ radial pulses. Resp:  Normal effort.  Clear bilaterally. Abd:  No distention.  Other:  Approximately 6 x 8 oval area of tenderness erythema and edema over the medial aspect of the left elbow extending towards the proximal and medial forearm.  No involvement of the posterior  elbow or apparent effusion.  Patient has full strength and range of motion left elbow.  No other overlying skin changes areas of edema or tenderness.  Left shoulder and wrist joints are unremarkable.  Sensation is intact in the distribution of the radial ulnar and median nerves.  2+ radial pulse.   ED Results / Procedures / Treatments  Labs (all labs ordered are listed, but only abnormal results are displayed) Labs Reviewed  CBC - Abnormal; Notable for the following components:      Result Value   RBC 5.50 (*)    Hemoglobin 15.6 (*)    HCT 50.1 (*)    All other components within normal limits  BASIC METABOLIC PANEL - Abnormal; Notable for the following components:   Glucose, Bld 164 (*)    All other components within normal limits     EKG    RADIOLOGY  Plain film of the left elbow obtained yesterday and reviewed by myself shows no fracture dislocation or joint  effusion.  Also reviewed radiologist interpretation agrees and agree with their findings.  Ultrasound of the left upper extremity shows no DVT but there is a superficial thrombophlebitis in the left basilic vein.  There is no abscess or any other abnormalities noted.  PROCEDURES:  Critical Care performed: No  Procedures    MEDICATIONS ORDERED IN ED: Medications  ibuprofen (ADVIL) tablet 400 mg (400 mg Oral Given 10/16/21 1511)     IMPRESSION / MDM / ASSESSMENT AND PLAN / ED COURSE  I reviewed the triage vital signs and the nursing notes.                              Differential diagnosis includes, but is not limited to, cellulitis, bursitis, superficial thrombophlebitis, and DVT with a lower suspicion for a septic joint given exam shows erythema fairly well demarcated over the skin on the medial aspect of the forearm without any circumferential involvement of the joint or large effusion on exam or limitation or pain on range of motion.  In addition x-ray reviewed by myself obtained yesterday showed no effusion or  any evidence of fracture.  BMP shows slight hyperglycemia with a glucose of 164 without any other significant electrolyte or metabolic derangements.  CBC shows no leukocytosis or acute anemia.  Ultrasound of the left upper extremity shows no DVT but there is a superficial thrombophlebitis in the left basilic vein.  There is no abscess or any other abnormalities noted.  No history of recent IV placement in the area or trauma.  At this point I do not believe anticoagulation currently indicated I think it is reasonable to trial supportive care with compression and NSAIDs elevation and follow-up in 7 to 10 days.  I discussed this with patient and necessity for returning to the emergency room for any falls of any shortness of breath, chest pain or spreading of the redness or worsening pain in the arm.  She is amenable to this.  Low suspicion for cellulitis at this time.  Discharged in stable condition.  Strict return precautions advised and discussed.  Also advised of elevated blood pressures noted emergency room today and recommendation at this rechecked by PCP in a couple days.     FINAL CLINICAL IMPRESSION(S) / ED DIAGNOSES   Final diagnoses:  Superficial phlebitis  Hypertension, unspecified type     Rx / DC Orders   ED Discharge Orders     None        Note:  This document was prepared using Dragon voice recognition software and may include unintentional dictation errors.   Lucrezia Starch, MD 10/16/21 1538

## 2021-10-16 NOTE — ED Triage Notes (Signed)
Pt to ED for pain and swelling to left arm that started 2 days ago, sent from UC. Reports pain worsens when she palpates arm.  Denies injury.

## 2021-10-22 ENCOUNTER — Ambulatory Visit (INDEPENDENT_AMBULATORY_CARE_PROVIDER_SITE_OTHER): Payer: Federal, State, Local not specified - PPO | Admitting: Nurse Practitioner

## 2021-12-01 ENCOUNTER — Other Ambulatory Visit: Payer: Self-pay | Admitting: Obstetrics and Gynecology

## 2021-12-01 DIAGNOSIS — Z1231 Encounter for screening mammogram for malignant neoplasm of breast: Secondary | ICD-10-CM

## 2021-12-06 ENCOUNTER — Encounter: Payer: Self-pay | Admitting: Emergency Medicine

## 2021-12-06 ENCOUNTER — Other Ambulatory Visit: Payer: Self-pay

## 2021-12-06 ENCOUNTER — Ambulatory Visit
Admission: EM | Admit: 2021-12-06 | Discharge: 2021-12-06 | Disposition: A | Discharge status: Home / Self Care | Payer: Federal, State, Local not specified - PPO | Attending: Internal Medicine | Admitting: Internal Medicine

## 2021-12-06 DIAGNOSIS — L0390 Cellulitis, unspecified: Secondary | ICD-10-CM

## 2021-12-06 DIAGNOSIS — S81801A Unspecified open wound, right lower leg, initial encounter: Secondary | ICD-10-CM

## 2021-12-06 MED ORDER — DOXYCYCLINE HYCLATE 100 MG PO CAPS: 100.0000 mg | ORAL_CAPSULE | Freq: Two times a day (BID) | ORAL | 0 refills | Status: DC

## 2021-12-06 NOTE — ED Provider Notes (Signed)
?[facility] ? ? ? ?CSN: [id number] ?Arrival date & time: 12/06/21  1420 ? ? ?  ? ?History   ?Chief Complaint ?Chief Complaint  ?Patient presents with  ? Wound Check  ? ? ?HPI ?Haley Deleon is a 66 y.o. female who presents with R lower leg wound which is red and swollen. She had hit it on a metal corner bed frame 1.5 weeks ago. She noticed a big chunk of tissue had been scraped. She did not seek medical care. Has been applying neosporin, but is not getting better. Her last TD was 7 y ago.  ? ? ?Past Medical History:  ?Diagnosis Date  ? Anemia   ? Atypical chest pain   ? Carotid arterial disease ([place])   ? a. 11/2017 Carotid U/S: RICA <47, LICA nl.  ? Claustrophobia   ? NO MEDS  ? Diastolic dysfunction   ? a. 11/2017 Echo: EF 60-65%, no rwma, Gr1 DD. Nl RV fxn.  ? Family history of adverse reaction to anesthesia   ? 1ST COUSIN-WAKE UP DURING SURGERY  ? Liver cyst   ? Ulcerative colitis ([place])   ? Venous insufficiency   ? ? ?Patient Active Problem List  ? Diagnosis Date Noted  ? PSVT (paroxysmal supraventricular tachycardia) ([place]) 07/17/2021  ? Palpitations 02/24/2021  ? NSVT (nonsustained ventricular tachycardia) 02/24/2021  ? Palpitation 02/24/2021  ? 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 Varicose veins of leg with pain, left 06/14/2020  ? Dizziness 11/17/2017  ? Murmur, cardiac 11/17/2017  ? Screening for cardiovascular condition 11/17/2017  ? Lower extremity edema 11/17/2017  ? Gallstones and inflammation of gallbladder without obstruction   ? Incisional hernia, without obstruction or gangrene   ? History of biliary T-tube placement 02/24/2014  ? Cellulitis 02/24/2014  ? Cellulitis and abscess 02/24/2014  ? Ulcerative colitis ([place]) 08/22/2013  ? ? ?Past Surgical History:  ?Procedure Laterality Date  ? BREAST BIOPSY Right 11/02/2011  ? ultrasound guided biopsy/clip-benign  ? CHOLECYSTECTOMY N/A 07/29/2017  ? Procedure: LAPAROSCOPIC CHOLECYSTECTOMY;  Surgeon: Jules Husbands, MD;   Location: [facility] ORS;  Service: General;  Laterality: N/A;  ? COLON SURGERY  2014, 2015  ? DUE TO ULERATIVE COLITIS WITH COLOSTOMY  ? COLOSTOMY TAKEDOWN    ? FOOT SURGERY Bilateral   ? BONE SPURS   ? [place]  ? MOUTH SURGERY    ? VENTRAL HERNIA REPAIR N/A 07/29/2017  ? Procedure: LAPAROSCOPIC VENTRAL HERNIA;  Surgeon: Jules Husbands, MD;  Location: [facility] ORS;  Service: General;  Laterality: N/A;  ? ? ?OB History   ?No obstetric history on file. ?  ? ? ? ?Home Medications   ? ?Prior to Admission medications   ?Medication Sig Start Date End Date Taking? Authorizing Provider  ?doxycycline (VIBRAMYCIN) 100 MG capsule Take 1 capsule (100 mg total) by mouth 2 (two) times daily. 12/06/21  Yes Rodriguez-Southworth, Sunday Spillers, PA-C  ?escitalopram (LEXAPRO) 10 MG tablet Take 10 mg by mouth daily.   Yes [provider]  ?ferrous sulfate 325 (65 FE) MG tablet Take 325 mg by mouth daily as needed.    Yes [provider]  ?hydrOXYzine (ATARAX) 10 MG tablet Take 10 mg by mouth daily.   Yes [provider]  ?Multiple Vitamin (MULTIVITAMIN) tablet Take 1 tablet by mouth daily.   Yes [provider]  ?FLUoxetine (PROZAC) 10 MG tablet Take 10 mg by mouth daily.    [provider]  ?loperamide (  IMODIUM) 2 MG capsule Take 2 mg as needed by mouth for diarrhea or loose stools (NORMALLY TAKES 2-3 TIMES DAILY SINCE HAVING HER COLON SURGERY).     [provider]  ? ? ?Family History ?Family History  ?Problem Relation Age of Onset  ? Heart attack Mother 16  ? Hyperlipidemia Brother   ? Breast cancer Neg Hx   ? ? ?Social History ?Social History  ? ?Tobacco Use  ? Smoking status: Former  ?  Packs/day: 1.00  ?  Years: 20.00  ?  Pack years: 20.00  ?  Types: Cigarettes  ?  Quit date: 07/28/2005  ?  Years since quitting: 16.3  ? Smokeless tobacco: Never  ?Vaping Use  ? Vaping Use: Never used  ?Substance Use Topics  ? Alcohol use: Yes  ?  Comment: RARE WINE  ? Drug use: No   ? ? ? ?Allergies   ?Caffeine, Chocolate, Chocolate flavor, Cocoa, and Meloxicam ? ? ?Review of Systems ?Review of Systems ? ? ?Physical Exam ?Triage Vital Signs ?ED Triage Vitals [12/06/21 1446]  ?Enc Vitals Group  ?   BP 132/85  ?   Pulse Rate 100  ?   Resp 16  ?   Temp 99.1 ?F (37.3 ?C)  ?   Temp Source Oral  ?   SpO2 95 %  ?   Weight   ?   Height   ?   Head Circumference   ?   Peak Flow   ?   Pain Score   ?   Pain Loc   ?   Pain Edu?   ?   Excl. in [place]?   ? ?No data found. ? ?Updated Vital Signs ?BP 132/85   Pulse 100   Temp 99.1 ?F (37.3 ?C) (Oral)   Resp 16   SpO2 95%  ? ?Visual Acuity ?Right Eye Distance:   ?Left Eye Distance:   ?Bilateral Distance:   ? ?Right Eye Near:   ?Left Eye Near:    ?Bilateral Near:    ? ?Physical Exam ?Vitals and nursing note reviewed.  ?HENT:  ?   Right Ear: External ear normal.  ?   Left Ear: External ear normal.  ?Eyes:  ?   General: No scleral icterus. ?   Conjunctiva/sclera: Conjunctivae normal.  ?Pulmonary:  ?   Effort: Pulmonary effort is normal.  ?Musculoskeletal:  ?   Cervical back: Neck supple.  ?Skin: ?   General: Skin is warm and dry.  ?   Comments: R LOWER LEG- with 3 x 4 cm deep wound with erythema around it and warmth. No red streaks noted. Has skin changes from venostasis.    ?Neurological:  ?   Mental Status: She is alert and oriented to person, place, and time.  ?   Gait: Gait normal.  ?Psychiatric:     ?   Mood and Affect: Mood normal.     ?   Behavior: Behavior normal.     ?   Thought Content: Thought content normal.     ?   Judgment: Judgment normal.  ? ? ? ?UC Treatments / Results  ?Labs ?(all labs ordered are listed, but only abnormal results are displayed) ?Labs Reviewed - No data to display ? ?EKG ? ? ?Radiology ?No results found. ? ?Procedures ?Procedures (including critical care time) ? ?Medications Ordered in UC ?Medications - No data to display ? ?Initial Impression / Assessment and Plan / UC Course  ?I have reviewed the triage vital  signs and the  nursing notes. ?R lower leg wound with cellulitis ?I placed her on Doxy, educated her how to care for her wound and needs to see PCP in 7-10 days or wound clinic.  ?Final Clinical Impressions(s) / UC Diagnoses  ? ?Final diagnoses:  ?Cellulitis, unspecified cellulitis site  ?Unspecified open wound, right lower leg, initial encounter  ? ? ? ?Discharge Instructions   ? ?  ?Follow up with your family doctor in 7-10 days to make sure is getting better. ?Cover the wound with gauze when out and about and go to bed.  ? ? ? ? ?ED Prescriptions   ? ? Medication Sig Dispense Auth. Provider  ? doxycycline (VIBRAMYCIN) 100 MG capsule Take 1 capsule (100 mg total) by mouth 2 (two) times daily. 20 capsule Rodriguez-Southworth, Sunday Spillers, PA-C  ? ?  ? ?PDMP not reviewed this encounter. ?  ?Shelby Mattocks, PA-C ?12/06/21 1508 ? ?

## 2021-12-06 NOTE — Discharge Instructions (Addendum)
Follow up with your family doctor in 7-10 days to make sure is getting better. ?Cover the wound with gauze when out and about and go to bed.  ?

## 2021-12-06 NOTE — ED Triage Notes (Signed)
PT hit right lower leg on metal bed frame 1.5 weeks ago. She did not get sutures. Swelling and redness around wound site. Not a diabetic, but does have venous issues in lower extremities.  ?

## 2021-12-26 ENCOUNTER — Ambulatory Visit: Payer: Federal, State, Local not specified - PPO | Admitting: Physician Assistant

## 2021-12-29 ENCOUNTER — Ambulatory Visit: Payer: Federal, State, Local not specified - PPO | Admitting: Physician Assistant

## 2021-12-30 ENCOUNTER — Encounter: Payer: Federal, State, Local not specified - PPO | Attending: Physician Assistant | Admitting: Physician Assistant

## 2021-12-30 DIAGNOSIS — L97812 Non-pressure chronic ulcer of other part of right lower leg with fat layer exposed: Secondary | ICD-10-CM | POA: Diagnosis not present

## 2021-12-30 DIAGNOSIS — I4729 Other ventricular tachycardia: Secondary | ICD-10-CM | POA: Insufficient documentation

## 2021-12-30 DIAGNOSIS — I872 Venous insufficiency (chronic) (peripheral): Secondary | ICD-10-CM | POA: Insufficient documentation

## 2022-01-06 DIAGNOSIS — I872 Venous insufficiency (chronic) (peripheral): Secondary | ICD-10-CM | POA: Diagnosis not present

## 2022-01-06 NOTE — Progress Notes (Signed)
SHANLEY, FURLOUGH ([id number]) ?Visit Report for 12/30/2021 ?Allergy List Details ?Patient Name: Haley Deleon, Haley Deleon ?Date of Service: 12/30/2021 8:45 AM ?Medical Record Number: [id number] ?Patient Account Number: [id number] ?Date of Birth/Sex: [birth year] (66 y.o. F) ?Treating RN: Carlene Coria ?Primary Care Romon Devereux: Denton Lank Other Clinician: ?Referring Lisandra Mathisen: Denton Lank ?Treating Phylliss Strege/Extender: Jeri Cos ?Weeks in Treatment: 0 ?Allergies ?Active Allergies ?caffeine ?meloxicam ?chocolate flavor ?Allergy Notes ?Electronic Signature(s) ?Signed: 01/06/2022 9:16:22 AM By: Carlene Coria RN ?Entered By: Carlene Coria on 12/30/2021 09:10:20 ?BELL, CARBO ([id number]) ?-------------------------------------------------------------------------------- ?Arrival Information Details ?Patient Name: Haley Deleon, Haley Deleon ?Date of Service: 12/30/2021 8:45 AM ?Medical Record Number: [id number] ?Patient Account Number: [id number] ?Date of Birth/Sex: [birth year] (66 y.o. F) ?Treating RN: Carlene Coria ?Primary Care Tiearra Colwell: Denton Lank Other Clinician: ?Referring Temari Schooler: Denton Lank ?Treating Emmersen Garraway/Extender: Jeri Cos ?Weeks in Treatment: 0 ?Visit Information ?Patient Arrived: Ambulatory ?Arrival Time: 09:04 ?Accompanied By: self ?Transfer Assistance: None ?Patient Identification Verified: Yes ?Secondary Verification Process Completed: Yes ?Patient Requires Transmission-Based Precautions: No ?Patient Has Alerts: No ?Electronic Signature(s) ?Signed: 01/06/2022 9:16:22 AM By: Carlene Coria RN ?Entered By: Carlene Coria on 12/30/2021 09:07:16 ?Haley Deleon, Haley Deleon ([id number]) ?-------------------------------------------------------------------------------- ?Clinic Level of Care Assessment Details ?Patient Name: Haley Deleon, Haley Deleon ?Date of Service: 12/30/2021 8:45 AM ?Medical Record Number: [id number] ?Patient Account Number: [id number] ?Date of Birth/Sex: 03/31/1956 (66 y.o. F) ?Treating RN: Carlene Coria ?Primary Care Dannell Gortney: Denton Lank  Other Clinician: ?Referring Gerell Fortson: Denton Lank ?Treating Kynedi Profitt/Extender: Jeri Cos ?Weeks in Treatment: 0 ?Clinic Level of Care Assessment Items ?TOOL 1 Quantity Score ?X - Use when EandM and Procedure is performed on INITIAL visit 1 0 ?ASSESSMENTS - Nursing Assessment / Reassessment ?X - General Physical Exam (combine w/ comprehensive assessment (listed just below) when performed on new ?1 20 ?pt. evals) ?X- 1 25 ?Comprehensive Assessment (HX, ROS, Risk Assessments, Wounds Hx, etc.) ?ASSESSMENTS - Wound and Skin Assessment / Reassessment ?[]  - Dermatologic / Skin Assessment (not related to wound area) 0 ?ASSESSMENTS - Ostomy and/or Continence Assessment and Care ?[]  - Incontinence Assessment and Management 0 ?[]  - 0 ?Ostomy Care Assessment and Management (repouching, etc.) ?PROCESS - Coordination of Care ?X - Simple Patient / Family Education for ongoing care 1 15 ?[]  - 0 ?Complex (extensive) Patient / Family Education for ongoing care ?X- 1 10 ?Staff obtains Consents, Records, Test Results / Process Orders ?[]  - 0 ?Staff telephones HHA, Nursing Homes / Clarify orders / etc ?[]  - 0 ?Routine Transfer to another Facility (non-emergent condition) ?[]  - 0 ?Routine Hospital Admission (non-emergent condition) ?X- 1 15 ?New Admissions / Biomedical engineer / Ordering NPWT, Apligraf, etc. ?[]  - 0 ?Emergency Hospital Admission (emergent condition) ?PROCESS - Special Needs ?[]  - Pediatric / Minor Patient Management 0 ?[]  - 0 ?Isolation Patient Management ?[]  - 0 ?Hearing / Language / Visual special needs ?[]  - 0 ?Assessment of Community assistance (transportation, D/C planning, etc.) ?[]  - 0 ?Additional assistance / Altered mentation ?[]  - 0 ?Support Surface(s) Assessment (bed, cushion, seat, etc.) ?INTERVENTIONS - Miscellaneous ?[]  - External ear exam 0 ?[]  - 0 ?Patient Transfer (multiple staff / Civil Service fast streamer / Similar devices) ?[]  - 0 ?Simple Staple / Suture removal (25 or less) ?[]  - 0 ?Complex Staple / Suture  removal (26 or more) ?[]  - 0 ?Hypo/Hyperglycemic Management (do not check if billed separately) ?X- 1 15 ?Ankle / Brachial Index (ABI) - do not check if billed separately ?Has the patient been seen at the hospital within the last three years: Yes ?Total Score: 100 ?Level Of  Care: New/Established - Level ?3 ?Haley Deleon, Haley Deleon ([id number]) ?Electronic Signature(s) ?Signed: 01/06/2022 9:16:22 AM By: Carlene Coria RN ?Entered By: Carlene Coria on 12/30/2021 09:44:15 ?Haley Deleon, Haley Deleon ([id number]) ?-------------------------------------------------------------------------------- ?Encounter Discharge Information Details ?Patient Name: Haley Deleon, Haley Deleon ?Date of Service: 12/30/2021 8:45 AM ?Medical Record Number: [id number] ?Patient Account Number: [id number] ?Date of Birth/Sex: January 15, 1956 (66 y.o. F) ?Treating RN: Carlene Coria ?Primary Care Leaha Cuervo: Denton Lank Other Clinician: ?Referring Taiten Brawn: Denton Lank ?Treating Jahquan Klugh/Extender: Jeri Cos ?Weeks in Treatment: 0 ?Encounter Discharge Information Items Post Procedure Vitals ?Discharge Condition: Stable ?Temperature (?F): 98.5 ?Ambulatory Status: Ambulatory ?Pulse (bpm): 98 ?Discharge Destination: Home ?Respiratory Rate (breaths/min): 20 ?Transportation: Private Auto ?Blood Pressure (mmHg): 162/94 ?Accompanied By: self ?Schedule Follow-up Appointment: Yes ?Clinical Summary of Care: Patient Declined ?Electronic Signature(s) ?Signed: 01/06/2022 9:16:22 AM By: Carlene Coria RN ?Entered By: Carlene Coria on 12/30/2021 09:45:37 ?Haley Deleon, Haley Deleon ([id number]) ?-------------------------------------------------------------------------------- ?Lower Extremity Assessment Details ?Patient Name: Haley Deleon, Haley Deleon ?Date of Service: 12/30/2021 8:45 AM ?Medical Record Number: [id number] ?Patient Account Number: [id number] ?Date of Birth/Sex: [birth year] (66 y.o. F) ?Treating RN: Carlene Coria ?Primary Care Lyan Holck: Denton Lank Other Clinician: ?Referring Dajai Wahlert: Denton Lank ?Treating  Larenzo Caples/Extender: Jeri Cos ?Weeks in Treatment: 0 ?Edema Assessment ?Assessed: [Left: No] [Right: No] ?Edema: [Left: Ye] [Right: s] ?Calf ?Left: Right: ?Point of Measurement: 38 cm From Medial Instep 46 cm ?Ankle ?Left: Right: ?Point of Measurement: 10 cm From Medial Instep 27 cm ?Knee To Floor ?Left: Right: ?From Medial Instep 46 cm ?Vascular Assessment ?Pulses: ?Dorsalis Pedis ?Palpable: [Right:Yes] ?Doppler Audible: [Right:Yes] ?Blood Pressure: ?Brachial: [Right:162] ?Ankle: ?[Right:Dorsalis Pedis: 140 0.86] ?Electronic Signature(s) ?Signed: 01/06/2022 9:16:22 AM By: Carlene Coria RN ?Entered By: Carlene Coria on 12/30/2021 09:25:49 ?Haley Deleon, Haley Deleon ([id number]) ?-------------------------------------------------------------------------------- ?Multi Wound Chart Details ?Patient Name: Haley Deleon, Haley Deleon ?Date of Service: 12/30/2021 8:45 AM ?Medical Record Number: [id number] ?Patient Account Number: [id number] ?Date of Birth/Sex: [birth year] (66 y.o. F) ?Treating RN: Carlene Coria ?Primary Care Sherena Machorro: Denton Lank Other Clinician: ?Referring Yuriel Lopezmartinez: Denton Lank ?Treating Klein Willcox/Extender: Jeri Cos ?Weeks in Treatment: 0 ?Vital Signs ?Height(in): 67 ?Pulse(bpm): 98 ?Weight(lbs): 280 ?Blood Pressure(mmHg): 162/94 ?Body Mass Index(BMI): 43.8 ?Temperature(??F): 98.5 ?Respiratory Rate(breaths/min): 20 ?Photos: [N/A:N/A] ?Wound Location: Right Lower Leg N/A N/A ?Wounding Event: Trauma N/A N/A ?Primary Etiology: Venous Leg Ulcer N/A N/A ?Comorbid History: Coronary Artery Disease N/A N/A ?Date Acquired: 11/19/2021 N/A N/A ?Weeks of Treatment: 0 N/A N/A ?Wound Status: Open N/A N/A ?Wound Recurrence: No N/A N/A ?Measurements L x W x D (cm) 1.5x2.7x0.2 N/A N/A ?Area (cm?) : 3.181 N/A N/A ?Volume (cm?) : 0.636 N/A N/A ?Classification: Full Thickness Without Exposed N/A N/A ?Support Structures ?Exudate Amount: Medium N/A N/A ?Exudate Type: Serosanguineous N/A N/A ?Exudate Color: red, brown N/A N/A ?Granulation Amount: Medium  (34-66%) N/A N/A ?Granulation Quality: Red, Pink N/A N/A ?Necrotic Amount: Medium (34-66%) N/A N/A ?Exposed Structures: ?Fat Layer (Subcutaneous Tissue): N/A N/A ?Yes ?Fascia: No ?Tendon: No ?Muscle: No

## 2022-01-06 NOTE — Progress Notes (Signed)
Haley, Deleon ([id number]) ?Visit Report for 12/30/2021 ?Chief Complaint Document Details ?Patient Name: Haley Deleon, Haley Deleon ?Date of Service: 12/30/2021 8:45 AM ?Medical Record Number: [id number] ?Patient Account Number: [id number] ?Date of Birth/Sex: December 02, 1955 (66 y.o. F) ?Treating RN: Carlene Coria ?Primary Care Provider: Denton Lank Other Clinician: ?Referring Provider: Denton Lank ?Treating Provider/Extender: Jeri Cos ?Weeks in Treatment: 0 ?Information Obtained from: Patient ?Chief Complaint ?Right LE Ulcer ?Electronic Signature(s) ?Signed: 12/30/2021 9:34:22 AM By: Worthy Keeler PA-Deleon ?Entered By: Worthy Keeler on 12/30/2021 09:34:21 ?CHELLI, YERKES ([id number]) ?-------------------------------------------------------------------------------- ?Debridement Details ?Patient Name: Haley, Deleon ?Date of Service: 12/30/2021 8:45 AM ?Medical Record Number: [id number] ?Patient Account Number: [id number] ?Date of Birth/Sex: May 09, 1956 (66 y.o. F) ?Treating RN: Carlene Coria ?Primary Care Provider: Denton Lank Other Clinician: ?Referring Provider: Denton Lank ?Treating Provider/Extender: Jeri Cos ?Weeks in Treatment: 0 ?Debridement Performed for ?Wound #1 Right Lower Leg ?Assessment: ?Performed By: Physician Tommie Sams., PA-Deleon ?Debridement Type: Debridement ?Severity of Tissue Pre Debridement: Fat layer exposed ?Level of Consciousness (Pre- ?Awake and Alert ?procedure): ?Pre-procedure Verification/Time Out ?Yes - 09:38 ?Taken: ?Start Time: 09:38 ?Total Area Debrided (L x W): 1.5 (cm) x 2.7 (cm) = 4.05 (cm?) ?Tissue and other material ?Viable, Non-Viable, Slough, Subcutaneous, Skin: Dermis , Skin: Epidermis, Biofilm, Slough ?debrided: ?Level: Skin/Subcutaneous Tissue ?Debridement Description: Excisional ?Instrument: Curette ?Bleeding: Moderate ?Hemostasis Achieved: Pressure ?End Time: 09:43 ?Procedural Pain: 0 ?Post Procedural Pain: 0 ?Response to Treatment: Procedure was tolerated well ?Level of  Consciousness (Post- ?Awake and Alert ?procedure): ?Post Debridement Measurements of Total Wound ?Length: (cm) 1.5 ?Width: (cm) 2.7 ?Depth: (cm) 0.2 ?Volume: (cm?) 0.636 ?Character of Wound/Ulcer Post Debridement: Improved ?Severity of Tissue Post Debridement: Fat layer exposed ?Post Procedure Diagnosis ?Same as Pre-procedure ?Electronic Signature(s) ?Signed: 12/30/2021 5:16:21 PM By: Worthy Keeler PA-Deleon ?Signed: 01/06/2022 9:16:22 AM By: Carlene Coria RN ?Entered By: Carlene Coria on 12/30/2021 09:40:56 ?MUSHKA, LACONTE ([id number]) ?-------------------------------------------------------------------------------- ?HPI Details ?Patient Name: Haley, Deleon ?Date of Service: 12/30/2021 8:45 AM ?Medical Record Number: [id number] ?Patient Account Number: [id number] ?Date of Birth/Sex: 24-Feb-1956 (66 y.o. F) ?Treating RN: Carlene Coria ?Primary Care Provider: Denton Lank Other Clinician: ?Referring Provider: Denton Lank ?Treating Provider/Extender: Jeri Cos ?Weeks in Treatment: 0 ?History of Present Illness ?HPI Description: 12-30-2021 upon evaluation today patient appears for initial inspection here in our clinic concerning issues that she has been ?having with a wound over the right lower extremity. She does have history of chronic venous insufficiency as well as ventricular tachycardia. She ?has had bilateral lower extremity ablations as well as sclerotherapy. With that being said the current wound started on March 1 according to what ?she tells me today. The ablations were June 17, 2020 and October 2022 I am not sure the exact date. Recently she has been on ?doxycycline from urgent care she wonders if she needs any additional antibiotics at this time. Fortunately there does not appear to be any ?evidence of active infection locally or systemically at this time which is great news and overall very pleased in that regard. I do believe that the ?patient would benefit from appropriate dressings and wound care. I am  hopeful that we will be able to get things started today and get this ?moving in a rapid progression towards complete healing. ?Electronic Signature(s) ?Signed: 12/31/2021 8:33:40 AM By: Worthy Keeler PA-Deleon ?Previous Signature: 12/30/2021 5:18:18 PM Version By: Worthy Keeler PA-Deleon ?Entered By: Worthy Keeler on 12/31/2021 08:33:40 ?SAJE, GALLOP ([id number]) ?-------------------------------------------------------------------------------- ?Physical Exam Details ?Patient Name: Haley,  Melany Deleon. ?Date of Service: 12/30/2021 8:45 AM ?Medical Record Number: [id number] ?Patient Account Number: [id number] ?Date of Birth/Sex: April 30, 1956 (66 y.o. F) ?Treating RN: Carlene Coria ?Primary Care Provider: Denton Lank Other Clinician: ?Referring Provider: Denton Lank ?Treating Provider/Extender: Jeri Cos ?Weeks in Treatment: 0 ?Constitutional ?patient is hypertensive.. pulse regular and within target range for patient.Marland Kitchen respirations regular, non-labored and within target range for patient.. ?temperature within target range for patient.. Well-nourished and well-hydrated in no acute distress. ?Eyes ?conjunctiva clear no eyelid edema noted. pupils equal round and reactive to light and accommodation. ?Ears, Nose, Mouth, and Throat ?no gross abnormality of ear auricles or external auditory canals. normal hearing noted during conversation. mucus membranes moist. ?Respiratory ?normal breathing without difficulty. ?Cardiovascular ?2+ dorsalis pedis/posterior tibialis pulses. 1+ pitting edema of the bilateral lower extremities. ?Musculoskeletal ?normal gait and posture. no significant deformity or arthritic changes, no loss or range of motion, no clubbing. ?Psychiatric ?this patient is able to make decisions and demonstrates good insight into disease process. Alert and Oriented x 3. pleasant and cooperative. ?Notes ?Upon inspection patient's wound bed actually showed signs of having some slough and biofilm noted on the surface of the  wound. I discussed with ?the patient that we do need to perform sharp debridement to clear away some of the necrotic debris she tolerated that today without complication ?and postdebridement the wound bed appears to be doing excellent. ?Electronic Signature(s) ?Signed: 12/31/2021 8:34:17 AM By: Worthy Keeler PA-Deleon ?Entered By: Worthy Keeler on 12/31/2021 08:34:17 ?CHRISTON, GALLAWAY ([id number]) ?-------------------------------------------------------------------------------- ?Physician Orders Details ?Patient Name: ERMINA, OBERMAN ?Date of Service: 12/30/2021 8:45 AM ?Medical Record Number: [id number] ?Patient Account Number: [id number] ?Date of Birth/Sex: Feb 11, 1956 (66 y.o. 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 ?Bathing/ Shower/ Hygiene ?o May shower with wound dressing protected with water repellent cover or cast protector. ?Edema Control - Lymphedema / Segmental Compressive Device / Other ?o Elevate, Exercise Daily and Avoid Standing for Long Periods of Time. ?o Elevate legs to the level of the heart and pump ankles as often as possible ?o Elevate leg(s) parallel to the floor when sitting. ?Wound Treatment ?Wound #1 - Lower Leg Wound Laterality: Right ?Primary Dressing: Hydrofera Blue Ready Transfer Foam, 2.5x2.5 (in/in) 1 x Per Week/30 Days ?Discharge Instructions: Apply Hydrofera Blue Ready to wound bed as directed ?Secondary Dressing: ABD Pad 5x9 (in/in) 1 x Per Week/30 Days ?Discharge Instructions: Cover with ABD pad ?Compression Wrap: 3-LAYER WRAP  - Profore Lite LF 3 Multilayer Compression Bandaging System 1 x Per Week/30 Days ?Discharge Instructions: Apply 3 multi-layer wrap as prescribed. ?Electronic Signature(s) ?Signed: 12/30/2021 5:16:21 PM By: Ky Barban

## 2022-01-06 NOTE — Progress Notes (Signed)
BRANAE, CRAIL ([id number]) ?Visit Report for 01/06/2022 ?Arrival Information Details ?Patient Name: Haley Deleon, Haley Deleon ?Date of Service: 01/06/2022 3:30 PM ?Medical Record Number: [id number] ?Patient Account Number: [id number] ?Date of Birth/Sex: [birth year] (66 y.o. F) ?Treating RN: Carlene Coria ?Primary Care Deandria Klute: Denton Lank Other Clinician: ?Referring Shalandra Leu: Denton Lank ?Treating Tyhesha Dutson/Extender: Jeri Cos ?Weeks in Treatment: 1 ?Visit Information History Since Last Visit ?All ordered tests and consults were completed: No ?Patient Arrived: Ambulatory ?Added or deleted any medications: No ?Arrival Time: 16:21 ?Any new allergies or adverse reactions: No ?Accompanied By: self ?Had a fall or experienced change in No ?Transfer Assistance: None ?activities of daily living that may affect ?Patient Identification Verified: Yes ?risk of falls: ?Secondary Verification Process Completed: Yes ?Signs or symptoms of abuse/neglect since last visito No ?Patient Requires Transmission-Based Precautions: No ?Hospitalized since last visit: No ?Patient Has Alerts: No ?Implantable device outside of the clinic excluding No ?cellular tissue based products placed in the center ?since last visit: ?Has Dressing in Place as Prescribed: Yes ?Has Compression in Place as Prescribed: Yes ?Pain Present Now: No ?Electronic Signature(s) ?Signed: 01/06/2022 4:23:18 PM By: Carlene Coria RN ?Entered By: Carlene Coria on 01/06/2022 16:23:18 ?ESBEIDY, MCLAINE ([id number]) ?-------------------------------------------------------------------------------- ?Clinic Level of Care Assessment Details ?Patient Name: Haley, Deleon ?Date of Service: 01/06/2022 3:30 PM ?Medical Record Number: [id number] ?Patient Account Number: [id number] ?Date of Birth/Sex: 02-26-1956 (66 y.o. F) ?Treating RN: Carlene Coria ?Primary Care Tesla Keeler: Denton Lank Other Clinician: ?Referring Reema Chick: Denton Lank ?Treating Omelia Marquart/Extender: Jeri Cos ?Weeks in Treatment:  1 ?Clinic Level of Care Assessment Items ?TOOL 1 Quantity Score ?[]  - Use when EandM and Procedure is performed on INITIAL visit 0 ?ASSESSMENTS - Nursing Assessment / Reassessment ?[]  - General Physical Exam (combine w/ comprehensive assessment (listed just below) when performed on new ?0 ?pt. evals) ?[]  - 0 ?Comprehensive Assessment (HX, ROS, Risk Assessments, Wounds Hx, etc.) ?ASSESSMENTS - Wound and Skin Assessment / Reassessment ?[]  - Dermatologic / Skin Assessment (not related to wound area) 0 ?ASSESSMENTS - Ostomy and/or Continence Assessment and Care ?[]  - Incontinence Assessment and Management 0 ?[]  - 0 ?Ostomy Care Assessment and Management (repouching, etc.) ?PROCESS - Coordination of Care ?[]  - Simple Patient / Family Education for ongoing care 0 ?[]  - 0 ?Complex (extensive) Patient / Family Education for ongoing care ?[]  - 0 ?Staff obtains Consents, Records, Test Results / Process Orders ?[]  - 0 ?Staff telephones HHA, Nursing Homes / Clarify orders / etc ?[]  - 0 ?Routine Transfer to another Facility (non-emergent condition) ?[]  - 0 ?Routine Hospital Admission (non-emergent condition) ?[]  - 0 ?New Admissions / Biomedical engineer / Ordering NPWT, Apligraf, etc. ?[]  - 0 ?Emergency Hospital Admission (emergent condition) ?PROCESS - Special Needs ?[]  - Pediatric / Minor Patient Management 0 ?[]  - 0 ?Isolation Patient Management ?[]  - 0 ?Hearing / Language / Visual special needs ?[]  - 0 ?Assessment of Community assistance (transportation, D/C planning, etc.) ?[]  - 0 ?Additional assistance / Altered mentation ?[]  - 0 ?Support Surface(s) Assessment (bed, cushion, seat, etc.) ?INTERVENTIONS - Miscellaneous ?[]  - External ear exam 0 ?[]  - 0 ?Patient Transfer (multiple staff / Civil Service fast streamer / Similar devices) ?[]  - 0 ?Simple Staple / Suture removal (25 or less) ?[]  - 0 ?Complex Staple / Suture removal (26 or more) ?[]  - 0 ?Hypo/Hyperglycemic Management (do not check if billed separately) ?[]  - 0 ?Ankle /  Brachial Index (ABI) - do not check if billed separately ?Has the patient been seen at the hospital within the last  three years: Yes ?Total Score: 0 ?Level Of Care: ____ ?ODIE, RAUEN ([id number]) ?Electronic Signature(s) ?Signed: 01/06/2022 4:47:42 PM By: Carlene Coria RN ?Entered By: Carlene Coria on 01/06/2022 16:28:01 ?AMIYLAH, ANASTOS ([id number]) ?-------------------------------------------------------------------------------- ?Compression Therapy Details ?Patient Name: Haley, Deleon ?Date of Service: 01/06/2022 3:30 PM ?Medical Record Number: [id number] ?Patient Account Number: [id number] ?Date of Birth/Sex: 1956/08/26 (66 y.o. F) ?Treating RN: Carlene Coria ?Primary Care Carlis Blanchard: Denton Lank Other Clinician: ?Referring Jadie Comas: Denton Lank ?Treating Alastor Kneale/Extender: Jeri Cos ?Weeks in Treatment: 1 ?Compression Therapy Performed for Wound Assessment: Wound #1 Right Lower Leg ?Performed By: Clinician Carlene Coria, RN ?Compression Type: Three Layer ?Electronic Signature(s) ?Signed: 01/06/2022 4:26:28 PM By: Carlene Coria RN ?Entered By: Carlene Coria on 01/06/2022 16:26:27 ?MARGARETTE, VANNATTER ([id number]) ?-------------------------------------------------------------------------------- ?Encounter Discharge Information Details ?Patient Name: Haley, Deleon ?Date of Service: 01/06/2022 3:30 PM ?Medical Record Number: [id number] ?Patient Account Number: [id number] ?Date of Birth/Sex: Dec 16, 1955 (66 y.o. F) ?Treating RN: Carlene Coria ?Primary Care Amandalynn Pitz: Denton Lank Other Clinician: ?Referring Kaitlin Ardito: Denton Lank ?Treating Axiel Fjeld/Extender: Jeri Cos ?Weeks in Treatment: 1 ?Encounter Discharge Information Items ?Discharge Condition: Stable ?Ambulatory Status: Ambulatory ?Discharge Destination: Home ?Transportation: Private Auto ?Accompanied By: self ?Schedule Follow-up Appointment: Yes ?Clinical Summary of Care: Patient Declined ?Electronic Signature(s) ?Signed: 01/06/2022 4:27:44 PM By: Carlene Coria  RN ?Entered By: Carlene Coria on 01/06/2022 16:27:44 ?BALI, LYN ([id number]) ?-------------------------------------------------------------------------------- ?Wound Assessment Details ?Patient Name: CONTESSA, PREUSS ?Date of Service: 01/06/2022 3:30 PM ?Medical Record Number: [id number] ?Patient Account Number: [id number] ?Date of Birth/Sex: 1956/07/08 (66 y.o. F) ?Treating RN: Carlene Coria ?Primary Care Iley Breeden: Denton Lank Other Clinician: ?Referring Lillie Bollig: Denton Lank ?Treating Loriene Taunton/Extender: Jeri Cos ?Weeks in Treatment: 1 ?Wound Status ?Wound Number: 1 ?Primary Etiology: Venous Leg Ulcer ?Wound Location: Right Lower Leg ?Wound Status: Open ?Wounding Event: Trauma ?Comorbid History: Coronary Artery Disease ?Date Acquired: 11/19/2021 ?Weeks Of Treatment: 1 ?Clustered Wound: No ?Wound Measurements ?Length: (cm) 1.5 ?Width: (cm) 2.7 ?Depth: (cm) 0.2 ?Area: (cm?) 3.181 ?Volume: (cm?) 0.636 ?% Reduction in Area: 0% ?% Reduction in Volume: 0% ?Epithelialization: Small (1-33%) ?Tunneling: No ?Undermining: No ?Wound Description ?Classification: Full Thickness Without Exposed Support Structu ?Exudate Amount: Medium ?Exudate Type: Serosanguineous ?Exudate Color: red, brown ?res Foul Odor After Cleansing: No ?Slough/Fibrino Yes ?Wound Bed ?Granulation Amount: Medium (34-66%) Exposed Structure ?Granulation Quality: Red, Pink ?Fascia Exposed: No ?Necrotic Amount: Medium (34-66%) ?Fat Layer (Subcutaneous Tissue) Exposed: Yes ?Necrotic Quality: Adherent Slough ?Tendon Exposed: No ?Muscle Exposed: No ?Joint Exposed: No ?Bone Exposed: No ?Treatment Notes ?Wound #1 (Lower Leg) Wound Laterality: Right ?Cleanser ?Peri-Wound Care ?Topical ?Primary Dressing ?Hydrofera Blue Ready Transfer Foam, 2.5x2.5 (in/in) ?Discharge Instruction: Apply Hydrofera Blue Ready to wound bed as directed ?Secondary Dressing ?ABD Pad 5x9 (in/in) ?Discharge Instruction: Cover with ABD pad ?Secured With ?Compression Wrap ?3-LAYER WRAP -  Profore Lite LF 3 Multilayer Compression Bandaging System ?Discharge Instruction: Apply 3 multi-layer wrap as prescribed. ?Compression Stockings ?SUTTON, PLAKE ([id number]) ?Add-Ons ?Electronic Signature(s) ?Signe

## 2022-01-06 NOTE — Progress Notes (Signed)
Haley, Deleon ([id number]) ?Visit Report for 12/30/2021 ?Abuse Risk Screen Details ?Patient Name: Haley Deleon, Haley Deleon ?Date of Service: 12/30/2021 8:45 AM ?Medical Record Number: [id number] ?Patient Account Number: [id number] ?Date of Birth/Sex: 05-Dec-1955 (66 y.o. F) ?Treating RN: Haley Deleon ?Primary Care Haley Deleon: Haley Deleon Other Clinician: ?Referring Haley Deleon: Haley Deleon ?Treating Haley Deleon/Extender: Haley Deleon ?Weeks in Treatment: 0 ?Abuse Risk Screen Items ?Answer ?ABUSE RISK SCREEN: ?Has anyone close to you tried to hurt or harm you recentlyo No ?Do you feel uncomfortable with anyone in your familyo No ?Has anyone forced you do things that you didnot want to doo No ?Electronic Signature(s) ?Signed: 01/06/2022 9:16:22 AM By: Haley Coria RN ?Entered By: Haley Deleon on 12/30/2021 09:13:06 ?[name], [name] ([id number]) ?-------------------------------------------------------------------------------- ?Activities of Daily Living Details ?Patient Name: Haley Deleon, Haley Deleon ?Date of Service: 12/30/2021 8:45 AM ?Medical Record Number: [id number] ?Patient Account Number: [id number] ?Date of Birth/Sex: 1956-05-28 (66 y.o. F) ?Treating RN: Haley Deleon ?Primary Care Haley Deleon: Haley Deleon Other Clinician: ?Referring Haley Deleon: Haley Deleon ?Treating Kattleya Kuhnert/Extender: Haley Deleon ?Weeks in Treatment: 0 ?Activities of Daily Living Items ?Answer ?Activities of Daily Living (Please select one for each item) ?[place] ?Take Medications Completely Able ?Use Telephone Completely Able ?Care for Appearance Completely Able ?Use Toilet Completely Able ?Bath / Shower Completely Able ?Dress Self Completely Able ?Feed Self Completely Able ?Walk Completely Able ?Get In / Out Bed Completely Able ?Housework Completely Able ?Prepare Meals Completely Able ?Handle Money Completely Able ?Shop for Self Completely Able ?Electronic Signature(s) ?Signed: 01/06/2022 9:16:22 AM By: Haley Coria RN ?Entered By: Haley Deleon on  12/30/2021 09:13:29 ?Haley, Deleon ([id number]) ?-------------------------------------------------------------------------------- ?Education Screening Details ?Patient Name: Haley Deleon, Haley Deleon ?Date of Service: 12/30/2021 8:45 AM ?Medical Record Number: [id number] ?Patient Account Number: [id number] ?Date of Birth/Sex: 10/12/1955 (66 y.o. F) ?Treating RN: Haley Deleon ?Primary Care Haley Deleon: Haley Deleon Other Clinician: ?Referring Haley Deleon: Haley Deleon ?Treating Haley Deleon/Extender: Haley Deleon ?Weeks in Treatment: 0 ?Primary Learner Assessed: Patient ?Learning Preferences/Education Level/Primary Language ?Learning Preference: Explanation ?Highest Education Level: College or Above ?Preferred Language: English ?Cognitive Barrier ?Language Barrier: No ?Translator Needed: No ?Memory Deficit: No ?Emotional Barrier: No ?Cultural/Religious Beliefs Affecting Medical Care: No ?Physical Barrier ?Impaired Vision: No ?Impaired Hearing: No ?Decreased Hand dexterity: No ?Knowledge/Comprehension ?Knowledge Level: Medium ?Comprehension Level: High ?Ability to understand written instructions: High ?Ability to understand verbal instructions: High ?Motivation ?Anxiety Level: Anxious ?Cooperation: Cooperative ?Education Importance: Acknowledges Need ?Interest in Health Problems: Asks Questions ?Perception: Coherent ?Willingness to Engage in Self-Management ?High ?Activities: ?Readiness to Engage in Self-Management ?High ?Activities: ?Electronic Signature(s) ?Signed: 01/06/2022 9:16:22 AM By: Haley Coria RN ?Entered By: Haley Deleon on 12/30/2021 09:14:01 ?Haley Deleon, Haley Deleon ([id number]) ?-------------------------------------------------------------------------------- ?Fall Risk Assessment Details ?Patient Name: Haley Deleon, Haley Deleon ?Date of Service: 12/30/2021 8:45 AM ?Medical Record Number: [id number] ?Patient Account Number: [id number] ?Date of Birth/Sex: Jul 25, 1956 (66 y.o. F) ?Treating RN: Haley Deleon ?Primary Care Haley Deleon: Haley Deleon  Other Clinician: ?Referring Haley Deleon: Haley Deleon ?Treating Haley Deleon/Extender: Haley Deleon ?Weeks in Treatment: 0 ?Fall Risk Assessment Items ?Have you had 2 or more falls in the last 12 monthso 0 No ?Have you had any fall that resulted in injury in the last 12 monthso 0 No ?FALLS RISK SCREEN ?History of falling - immediate or within 3 months 0 No ?Secondary diagnosis (Do you have 2 or more medical diagnoseso) 0 No ?Ambulatory aid ?None/bed rest/wheelchair/nurse 0 No ?Crutches/cane/walker 0 No ?Furniture 0 No ?Intravenous therapy Access/Saline/Heparin Lock 0 No ?Gait/Transferring ?Normal/ bed rest/ wheelchair 0 No ?Weak (short steps with  or without shuffle, stooped but able to lift head while walking, may ?0 No ?seek support from furniture) ?Impaired (short steps with shuffle, may have difficulty arising from chair, head down, impaired ?0 No ?balance) ?Mental Status ?Oriented to own ability 0 No ?Electronic Signature(s) ?Signed: 01/06/2022 9:16:22 AM By: Haley Coria RN ?Entered By: Haley Deleon on 12/30/2021 09:14:08 ?Haley, Deleon ([id number]) ?-------------------------------------------------------------------------------- ?Foot Assessment Details ?Patient Name: Haley Deleon, Haley Deleon ?Date of Service: 12/30/2021 8:45 AM ?Medical Record Number: [id number] ?Patient Account Number: [id number] ?Date of Birth/Sex: [birth year] (66 y.o. F) ?Treating RN: Haley Deleon ?Primary Care Haley Deleon: Haley Deleon Other Clinician: ?Referring Haley Deleon: Haley Deleon ?Treating Haley Deleon/Extender: Haley Deleon ?Weeks in Treatment: 0 ?Foot Assessment Items ?Site Locations ?+ = Sensation present, - = Sensation absent, C = Callus, U = Ulcer ?R = Redness, W = Warmth, M = Maceration, PU = Pre-ulcerative lesion ?F = Fissure, S = Swelling, D = Dryness ?Assessment ?Right: Left: ?Other Deformity: No No ?Prior Foot Ulcer: No No ?Prior Amputation: No No ?Charcot Joint: No No ?Ambulatory Status: Ambulatory Without Help ?Gait: Steady ?Electronic  Signature(s) ?Signed: 01/06/2022 9:16:22 AM By: Haley Coria RN ?Entered By: Haley Deleon on 12/30/2021 09:23:25 ?NETHRA, MEHLBERG ([id number]) ?-------------------------------------------------------------------------------- ?Nutrition Risk Screening Details ?Patient Name: Haley Deleon, Haley Deleon ?Date of Service: 12/30/2021 8:45 AM ?Medical Record Number: [id number] ?Patient Account Number: [id number] ?Date of Birth/Sex: 10/03/1955 (66 y.o. F) ?Treating RN: Haley Deleon ?Primary Care Kinzey Sheriff: Haley Deleon Other Clinician: ?Referring Nareh Matzke: Haley Deleon ?Treating Hai Grabe/Extender: Haley Deleon ?Weeks in Treatment: 0 ?Height (in): 67 ?Weight (lbs): 280 ?Body Mass Index (BMI): 43.8 ?Nutrition Risk Screening Items ?Score Screening ?NUTRITION RISK SCREEN: ?I have an illness or condition that made me change the kind and/or amount of food I eat 0 No ?I eat fewer than two meals per day 0 No ?I eat few fruits and vegetables, or milk products 0 No ?I have three or more drinks of beer, liquor or wine almost every day 0 No ?I have tooth or mouth problems that make it hard for me to eat 0 No ?I don't always have enough money to buy the food I need 0 No ?I eat alone most of the time 0 No ?I take three or more different prescribed or over-the-counter drugs a day 1 Yes ?Without wanting to, I have lost or gained 10 pounds in the last six months 0 No ?I am not always physically able to shop, cook and/or feed myself 0 No ?Nutrition Protocols ?Good Risk Protocol 0 No interventions needed ?Moderate Risk Protocol ?High Risk Proctocol ?Risk Level: Good Risk ?Score: 1 ?Electronic Signature(s) ?Signed: 01/06/2022 9:16:22 AM By: Haley Coria RN ?Entered By: Haley Deleon on 12/30/2021 09:14:24 ?

## 2022-01-07 NOTE — Progress Notes (Signed)
KERBY, HOCKLEY ([id number]) ?Visit Report for 01/06/2022 ?Physician Orders Details ?Patient Name: LIBBIE, BARTLEY ?Date of Service: 01/06/2022 3:30 PM ?Medical Record Number: [id number] ?Patient Account Number: [id number] ?Date of Birth/Sex: 1955-10-19 (66 y.o. F) ?Treating RN: Carlene Coria ?Primary Care Provider: Denton Lank Other Clinician: ?Referring Provider: Denton Lank ?Treating Provider/Extender: Jeri Cos ?Weeks in Treatment: 1 ?Verbal / Phone Orders: No ?Diagnosis Coding ?Follow-up Appointments ?o Return Appointment in 1 week. ?Bathing/ Shower/ Hygiene ?o May shower with wound dressing protected with water repellent cover or cast protector. ?Edema Control - Lymphedema / Segmental Compressive Device / Other ?o Elevate, Exercise Daily and Avoid Standing for Long Periods of Time. ?o Elevate legs to the level of the heart and pump ankles as often as possible ?o Elevate leg(s) parallel to the floor when sitting. ?Wound Treatment ?Wound #1 - Lower Leg Wound Laterality: Right ?Primary Dressing: Hydrofera Blue Ready Transfer Foam, 2.5x2.5 (in/in) 1 x Per Week/30 Days ?Discharge Instructions: Apply Hydrofera Blue Ready to wound bed as directed ?Secondary Dressing: ABD Pad 5x9 (in/in) 1 x Per Week/30 Days ?Discharge Instructions: Cover with ABD pad ?Compression Wrap: 3-LAYER WRAP - Profore Lite LF 3 Multilayer Compression Bandaging System 1 x Per Week/30 Days ?Discharge Instructions: Apply 3 multi-layer wrap as prescribed. ?Electronic Signature(s) ?Signed: 01/06/2022 4:27:15 PM By: Carlene Coria RN ?Signed: 01/06/2022 5:00:17 PM By: Worthy Keeler PA-C ?Entered By: Carlene Coria on 01/06/2022 16:27:14 ?ALAISA, MOFFITT ([id number]) ?-------------------------------------------------------------------------------- ?SuperBill Details ?Patient Name: YITTY, ROADS ?Date of Service: 01/06/2022 ?Medical Record Number: [id number] ?Patient Account Number: [id number] ?Date of Birth/Sex: 1956-08-27 (66 y.o.  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

## 2022-01-08 ENCOUNTER — Ambulatory Visit: Payer: Federal, State, Local not specified - PPO

## 2022-01-13 ENCOUNTER — Encounter: Payer: Federal, State, Local not specified - PPO | Admitting: Physician Assistant

## 2022-01-13 DIAGNOSIS — I872 Venous insufficiency (chronic) (peripheral): Secondary | ICD-10-CM | POA: Diagnosis not present

## 2022-01-13 NOTE — Progress Notes (Addendum)
CHENILLE, TOOR ([id number]) ?Visit Report for 01/13/2022 ?Chief Complaint Document Details ?Patient Name: Haley Deleon, Haley Deleon ?Date of Service: 01/13/2022 8:45 AM ?Medical Record Number: [id number] ?Patient Account Number: [id number] ?Date of Birth/Sex: [birth year] (66 y.o. F) ?Treating RN: Cornell Barman ?Primary Care Provider: Denton Lank Other Clinician: ?Referring Provider: Denton Lank ?Treating Provider/Extender: Jeri Cos ?Weeks in Treatment: 2 ?Information Obtained from: Patient ?Chief Complaint ?Right LE Ulcer ?Electronic Signature(s) ?Signed: 01/13/2022 9:02:19 AM By: Worthy Keeler PA-C ?Entered By: Worthy Keeler on 01/13/2022 09:02:19 ?Haley Deleon, Haley Deleon ([id number]) ?-------------------------------------------------------------------------------- ?HPI Details ?Patient Name: Haley Deleon, Haley Deleon ?Date of Service: 01/13/2022 8:45 AM ?Medical Record Number: [id number] ?Patient Account Number: [id number] ?Date of Birth/Sex: 1955/12/10 (66 y.o. F) ?Treating RN: Cornell Barman ?Primary Care Provider: Denton Lank Other Clinician: ?Referring Provider: Denton Lank ?Treating Provider/Extender: Jeri Cos ?Weeks in Treatment: 2 ?History of Present Illness ?HPI Description: 12-30-2021 upon evaluation today patient appears for initial inspection here in our clinic concerning issues that she has been ?having with a wound over the right lower extremity. She does have history of chronic venous insufficiency as well as ventricular tachycardia. She ?has had bilateral lower extremity ablations as well as sclerotherapy. With that being said the current wound started on March 1 according to what ?she tells me today. The ablations were June 17, 2020 and October 2022 I am not sure the exact date. Recently she has been on ?doxycycline from urgent care she wonders if she needs any additional antibiotics at this time. Fortunately there does not appear to be any ?evidence of active infection locally or systemically at this time which is  great news and overall very pleased in that regard. I do believe that the ?patient would benefit from appropriate dressings and wound care. I am hopeful that we will be able to get things started today and get this ?moving in a rapid progression towards complete healing. ?01-13-2022 upon evaluation today patient appears to be doing well with regard to her wound in fact this is measuring significantly smaller and it is ?again only been 1 week since she was first here in the clinic. Overall I think that this is showing excellent improvement. ?Electronic Signature(s) ?Signed: 01/13/2022 9:13:17 AM By: Worthy Keeler PA-C ?Entered By: Worthy Keeler on 01/13/2022 09:13:17 ?VIENNA, FOLDEN ([id number]) ?-------------------------------------------------------------------------------- ?Physical Exam Details ?Patient Name: Haley Deleon, Haley Deleon ?Date of Service: 01/13/2022 8:45 AM ?Medical Record Number: [id number] ?Patient Account Number: [id number] ?Date of Birth/Sex: September 03, 1956 (66 y.o. F) ?Treating RN: Cornell Barman ?Primary Care Provider: Denton Lank Other Clinician: ?Referring Provider: Denton Lank ?Treating Provider/Extender: Jeri Cos ?Weeks in Treatment: 2 ?Constitutional ?Obese and well-hydrated in no acute distress. ?Respiratory ?normal breathing without difficulty. ?Psychiatric ?this patient is able to make decisions and demonstrates good insight into disease process. Alert and Oriented x 3. pleasant and cooperative. ?Notes ?[name] wound bed showed evidence of good granulation and epithelization at this point. There was minimal hypergranulation I do believe that ?the [facility] is doing a good job in helping to mitigate this. Overall I think that there is no evidence of infection at this point and overall she ?is headed in the right direction. ?Electronic Signature(s) ?Signed: 01/13/2022 9:13:32 AM By: Worthy Keeler PA-C ?Entered By: Worthy Keeler on 01/13/2022 09:13:31 ?Haley Deleon, Haley Deleon  ([id number]) ?-------------------------------------------------------------------------------- ?Physician Orders Details ?Patient Name: Haley Deleon, Haley Deleon ?Date of Service: 01/13/2022 8:45 AM ?Medical Record Number: [id number] ?Patient Account Number: [id number] ?Date of Birth/Sex: 1956/07/22 (66 y.o. 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 ?Bathing/ Shower/ Hygiene ?o May shower with wound dressing protected with water repellent cover or cast protector. ?Edema Control - Lymphedema / Segmental Compressive Device / Other ?o Elevate, Exercise Daily and Avoid Standing for Long Periods of Time. ?o Elevate legs to the level of the heart and pump ankles as often as possible ?o Elevate leg(s) parallel to the floor when sitting. ?Wound Treatment ?Wound #1 - Lower Leg Wound Laterality: Right ?Primary Dressing: Hydrofera Blue Ready Transfer Foam, 2.5x2.5 (in/in) 1 x Per Week/30 Days ?Discharge Instructions: Apply Hydrofera Blue Ready to wound bed as directed ?Secondary Dressing: ABD Pad 5x9 (in/in) 1 x Per Week/30 Days ?Discharge Instructions: Cover with ABD pad ?Compression Wrap: 3-LAYER WRAP - Profore Lite LF 3 Multilayer Compression Bandaging System 1 x Per Week/30 Days ?Discharge Instructions: Apply 3 multi-layer wrap as prescribed. ?Electronic Signature(s) ?Signed: 01/13/2022 3:52:54 PM By: Gretta Cool, BSN, RN, CWS, Kim RN, BSN ?Signed: 01/13/2022 6:11:27 PM By: Worthy Keeler PA-C ?Entered By: Gretta Cool, BSN, RN, CWS, Kim on 01/13/2022 09:16:36 ?Haley Deleon, Haley Deleon  ([id number]) ?-------------------------------------------------------------------------------- ?Problem List Details ?Patient Name: Haley Deleon, Haley Deleon ?Date of Service: 01/13/2022 8:45 AM ?Medical Record Number: [id number] ?Patient Account Number: [id number] ?Date of Birth/Sex: [birth year] (66 y.o. F) ?Treating RN: Cornell Barman ?Primary Care Provider: Denton Lank Other Clinician: ?Referring Provider: Denton Lank ?Treating Provider/Extender: Jeri Cos ?Weeks in Treatment: 2 ?Active Problems ?ICD-10 ?Encounter ?Code Description Active Date MDM ?Diagnosis ?I87.2 Venous insufficiency (chronic) (peripheral) 12/30/2021 No Yes ?N17.001 Non-pressure chronic ulcer of other part of right lower leg with fat layer 12/30/2021 No Yes ?exposed ?I47.29 Other ventricular tachycardia 12/30/2021 No Yes ?Inactive Problems ?Resolved Problems ?Electronic Signature(s) ?Signed: 01/13/2022 9:01:48 AM By: Worthy Keeler PA-C ?Entered By: Worthy Keeler on 01/13/2022 09:01:47 ?Haley Deleon, Haley Deleon ([id number]) ?-------------------------------------------------------------------------------- ?Progress Note Details ?Patient Name: Haley Deleon, Haley Deleon ?Date of Service: 01/13/2022 8:45 AM ?Medical Record Number: [id number] ?Patient Account Number: [id number] ?Date of Birth/Sex: 1956/08/08 (66 y.o. F) ?Treating RN: Cornell Barman ?Primary Care Provider: Denton Lank Other Clinician: ?Referring Provider: Denton Lank ?Treating Provider/Extender: Jeri Cos ?Weeks in Treatment: 2 ?Subjective ?Chief Complaint ?Information obtained from Patient ?Right LE Ulcer ?History of Present Illness (HPI) ?12-30-2021 upon evaluation today patient appears for initial inspection here in our clinic concerning issues that she has been having with a wound ?over the right lower extremity. She does have history of chronic venous insufficiency as well as ventricular tachycardia. She has had bilateral ?lower extremity ablations as well as sclerotherapy. With that being said the current wound  started on March 1 according to what she tells me ?today. The ablations were June 17, 2020 and October 2022 I am not sure the exact date. Recently she has been on doxycycline from urgent ?care she wonders if she needs any additional

## 2022-01-14 NOTE — Progress Notes (Signed)
MERYL, HUBERS ([id number]) ?Visit Report for 01/13/2022 ?Arrival Information Details ?Patient Name: Haley Deleon, Haley Deleon ?Date of Service: 01/13/2022 8:45 AM ?Medical Record Number: [id number] ?Patient Account Number: [id number] ?Date of Birth/Sex: [birth year] (66 y.o. F) ?Treating RN: Cornell Barman ?Primary Care Master Touchet: Denton Lank Other Clinician: ?Referring Sila Sarsfield: Denton Lank ?Treating Dazja Houchin/Extender: Jeri Cos ?Weeks in Treatment: 2 ?Visit Information History Since Last Visit ?Has Dressing in Place as Prescribed: Yes ?Patient Arrived: Ambulatory ?Has Compression in Place as Prescribed: Yes ?Arrival Time: 08:54 ?Pain Present Now: No ?Accompanied By: self ?Transfer Assistance: None ?Patient Identification Verified: Yes ?Secondary Verification Process Completed: Yes ?Patient Requires Transmission-Based Precautions: No ?Patient Has Alerts: No ?Electronic Signature(s) ?Signed: 01/13/2022 3:52:54 PM By: Gretta Cool, BSN, RN, CWS, Kim RN, BSN ?Entered By: Gretta Cool, BSN, RN, CWS, Kim on 01/13/2022 08:54:35 ?[name], [name] ([id number]) ?-------------------------------------------------------------------------------- ?Compression Therapy Details ?Patient Name: Haley Deleon, Haley Deleon ?Date of Service: 01/13/2022 8:45 AM ?Medical Record Number: [id number] ?Patient Account Number: [id number] ?Date of Birth/Sex: [birth year] (66 y.o. F) ?Treating RN: Cornell Barman ?Primary Care Michaelyn Wall: Denton Lank Other Clinician: ?Referring Ariyan Sinnett: Denton Lank ?Treating Neliah Cuyler/Extender: Jeri Cos ?Weeks in Treatment: 2 ?Compression Therapy Performed for Wound Assessment: Wound #1 Right Lower Leg ?Performed By: Clinician Cornell Barman, RN ?Compression Type: Three Layer ?Pre Treatment ABI: 0.9 ?Post Procedure Diagnosis ?Same as Pre-procedure ?Electronic Signature(s) ?Signed: 01/13/2022 3:52:54 PM By: Gretta Cool, BSN, RN, CWS, Kim RN, BSN ?Entered By: Gretta Cool, BSN, RN, CWS, Kim on 01/13/2022 09:16:03 ?Haley Deleon, Haley Deleon  ([id number]) ?-------------------------------------------------------------------------------- ?Encounter Discharge Information Details ?Patient Name: Haley Deleon, Haley Deleon ?Date of Service: 01/13/2022 8:45 AM ?Medical Record Number: [id number] ?Patient Account Number: [id number] ?Date of Birth/Sex: May 31, 1956 (66 y.o. F) ?Treating RN: Cornell Barman ?Primary Care Jerred Zaremba: Denton Lank Other Clinician: ?Referring Arick Mareno: Denton Lank ?Treating David Rodriquez/Extender: Jeri Cos ?Weeks in Treatment: 2 ?Encounter Discharge Information Items ?Discharge Condition: Stable ?Ambulatory Status: Ambulatory ?Discharge Destination: Home ?Transportation: Private Auto ?Accompanied By: self ?Schedule Follow-up Appointment: Yes ?Clinical Summary of Care: ?Electronic Signature(s) ?Signed: 01/13/2022 3:52:54 PM By: Gretta Cool, BSN, RN, CWS, Kim RN, BSN ?Entered By: Gretta Cool, BSN, RN, CWS, Kim on 01/13/2022 09:18:15 ?Haley Deleon, Haley Deleon ([id number]) ?-------------------------------------------------------------------------------- ?Lower Extremity Assessment Details ?Patient Name: Haley Deleon, Haley Deleon ?Date of Service: 01/13/2022 8:45 AM ?Medical Record Number: [id number] ?Patient Account Number: [id number] ?Date of Birth/Sex: [birth year] (66 y.o. F) ?Treating RN: Cornell Barman ?Primary Care Yeison Sippel: Denton Lank Other Clinician: ?Referring Samaria Anes: Denton Lank ?Treating Cherylene Ferrufino/Extender: Jeri Cos ?Weeks in Treatment: 2 ?Edema Assessment ?Assessed: [Left: No] [Right: No] ?[Left: Edema] [Right: :] ?Calf ?Left: Right: ?Point of Measurement: 38 cm From Medial Instep 48 cm ?Ankle ?Left: Right: ?Point of Measurement: 10 cm From Medial Instep 27 cm ?Vascular Assessment ?Pulses: ?Dorsalis Pedis ?Palpable: [Right:Yes] ?Electronic Signature(s) ?Signed: 01/13/2022 3:52:54 PM By: Gretta Cool, BSN, RN, CWS, Kim RN, BSN ?Entered By: Gretta Cool, BSN, RN, CWS, Kim on 01/13/2022 09:02:50 ?Haley Deleon, Haley Deleon  ([id number]) ?-------------------------------------------------------------------------------- ?Multi Wound Chart Details ?Patient Name: Haley Deleon, Haley Deleon ?Date of Service: 01/13/2022 8:45 AM ?Medical Record Number: [id number] ?Patient Account Number: [id number] ?Date of Birth/Sex: [birth year] (66 y.o. F) ?Treating RN: Cornell Barman ?Primary Care Retia Cordle: Denton Lank Other Clinician: ?Referring Lielle Vandervort: Denton Lank ?Treating Dmitri Pettigrew/Extender: Jeri Cos ?Weeks in Treatment: 2 ?Vital Signs ?Height(in): 67 ?Pulse(bpm): 99 ?Weight(lbs): 280 ?Blood Pressure(mmHg): 172/85 ?Body Mass Index(BMI): 43.8 ?Temperature(??F): 97.9 ?Respiratory Rate(breaths/min): 18 ?Photos: [N/A:N/A] ?Wound Location: Right Lower Leg N/A N/A ?Wounding Event: Trauma N/A N/A ?Primary Etiology: Venous Leg Ulcer N/A N/A ?Comorbid History: Coronary Artery Disease N/A N/A ?Date Acquired: 11/19/2021 N/A N/A ?Weeks of  Treatment: 2 N/A N/A ?Wound Status: Open N/A N/A ?Wound Recurrence: No N/A N/A ?Measurements L x W x D (cm) 1.5x1.9x0.2 N/A N/A ?Area (cm?) : 2.238 N/A N/A ?Volume (cm?) : 0.448 N/A N/A ?% Reduction in Area: 29.60% N/A N/A ?% Reduction in Volume: 29.60% N/A N/A ?Classification: Full Thickness Without Exposed N/A N/A ?Support Structures ?Exudate Amount: Medium N/A N/A ?Exudate Type: Serosanguineous N/A N/A ?Exudate Color: red, brown N/A N/A ?Granulation Amount: Medium (34-66%) N/A N/A ?Granulation Quality: Red, Pink N/A N/A ?Necrotic Amount: Medium (34-66%) N/A N/A ?Exposed Structures: ?Fat Layer (Subcutaneous Tissue): N/A N/A ?Yes ?Fascia: No ?Tendon: No ?Muscle: No ?Joint: No ?Bone: No ?Epithelialization: Small (1-33%) N/A N/A ?Treatment Notes ?Electronic Signature(s) ?Signed: 01/13/2022 3:52:54 PM By: Gretta Cool, BSN, RN, CWS, Kim RN, BSN ?Entered By: Gretta Cool, BSN, RN, CWS, Kim on 01/13/2022 09:06:46 ?Haley Deleon, Haley Deleon ([id number]) ?-------------------------------------------------------------------------------- ?Multi-Disciplinary Care Plan  Details ?Patient Name: Haley Deleon, Haley Deleon ?Date of Service: 01/13/2022 8:45 AM ?Medical Record Number: [id number] ?Patient Account Number: [id number] ?Date of Birth/Sex: 1956/02/10 (66 y.o. F) ?Treating RN: Cornell Barman ?Primary Care Faustine Tates: Denton Lank Other Clinician: ?Referring Tommy Minichiello: Denton Lank ?Treating Cray Monnin/Extender: Jeri Cos ?Weeks in Treatment: 2 ?Active Inactive ?Wound/Skin Impairment ?Nursing Diagnoses: ?Knowledge deficit related to ulceration/compromised skin integrity ?Goals: ?Patient/caregiver will verbalize understanding of skin care regimen ?Date Initiated: 12/30/2021 ?Target Resolution Date: 01/29/2022 ?Goal Status: Active ?Ulcer/skin breakdown will have a volume reduction of 30% by week 4 ?Date Initiated: 12/30/2021 ?Target Resolution Date: 01/29/2022 ?Goal Status: Active ?Ulcer/skin breakdown will have a volume reduction of 50% by week 8 ?Date Initiated: 12/30/2021 ?Target Resolution Date: 03/01/2022 ?Goal Status: Active ?Ulcer/skin breakdown will have a volume reduction of 80% by week 12 ?Date Initiated: 12/30/2021 ?Target Resolution Date: 03/31/2022 ?Goal Status: Active ?Ulcer/skin breakdown will heal within 14 weeks ?Date Initiated: 12/30/2021 ?Target Resolution Date: 05/01/2022 ?Goal Status: Active ?Interventions: ?Assess patient/caregiver ability to obtain necessary supplies ?Assess patient/caregiver ability to perform ulcer/skin care regimen upon admission and as needed ?Assess ulceration(s) every visit ?Notes: ?Electronic Signature(s) ?Signed: 01/13/2022 3:52:54 PM By: Gretta Cool, BSN, RN, CWS, Kim RN, BSN ?Entered By: Gretta Cool, BSN, RN, CWS, Kim on 01/13/2022 56:70:14 ?Haley Deleon, Haley Deleon ([id number]) ?-------------------------------------------------------------------------------- ?Pain Assessment Details ?Patient Name: Haley Deleon, CHESMORE ?Date of Service: 01/13/2022 8:45 AM ?Medical Record Number: [id number] ?Patient Account Number: [id number] ?Date of Birth/Sex: May 04, 1956 (66 y.o. F) ?Treating RN: Cornell Barman ?Primary Care Saleem Coccia: Denton Lank Other Clinician: ?Referring Foye Haggart: Denton Lank ?Treating Davionne Mastrangelo/Extender: Jeri Cos ?Weeks in Treatment: 2 ?Active Problems ?Location of Pain Severity and Description of Pain ?Patient Has Paino No ?Site Locations ?Pain Management and Medication ?Current Pain Management: ?Notes ?Patient denie

## 2022-01-22 ENCOUNTER — Encounter: Payer: Federal, State, Local not specified - PPO | Attending: Physician Assistant | Admitting: Physician Assistant

## 2022-01-22 DIAGNOSIS — L97812 Non-pressure chronic ulcer of other part of right lower leg with fat layer exposed: Secondary | ICD-10-CM | POA: Diagnosis not present

## 2022-01-22 DIAGNOSIS — I4729 Other ventricular tachycardia: Secondary | ICD-10-CM | POA: Insufficient documentation

## 2022-01-22 DIAGNOSIS — Z09 Encounter for follow-up examination after completed treatment for conditions other than malignant neoplasm: Secondary | ICD-10-CM | POA: Insufficient documentation

## 2022-01-22 DIAGNOSIS — I872 Venous insufficiency (chronic) (peripheral): Secondary | ICD-10-CM | POA: Diagnosis present

## 2022-01-22 NOTE — Progress Notes (Addendum)
Haley Deleon, Haley Deleon ([id number]) ?Visit Report for 01/22/2022 ?Arrival Information Details ?Patient Name: Haley Deleon, Haley Deleon ?Date of Service: 01/22/2022 8:00 AM ?Medical Record Number: [id number] ?Patient Account Number: [id number] ?Date of Birth/Sex: 07/09/1956 (66 y.o. F) ?Treating RN: Cornell Barman ?Primary Care Reginia Battie: Denton Lank Other Clinician: ?Referring Maragret Vanacker: Denton Lank ?Treating Kali Deadwyler/Extender: Haley Deleon ?Weeks in Treatment: 3 ?Visit Information History Since Last Visit ?Added or deleted any medications: No ?Patient Arrived: Ambulatory ?Has Dressing in Place as Prescribed: Yes ?Arrival Time: 08:14 ?Pain Present Now: No ?Transfer Assistance: None ?Patient Identification Verified: Yes ?Secondary Verification Process Completed: Yes ?Patient Requires Transmission-Based Precautions: No ?Patient Has Alerts: No ?Electronic Signature(s) ?Signed: 01/22/2022 5:29:17 PM By: Gretta Cool, BSN, RN, CWS, Kim RN, BSN ?Entered By: Gretta Cool, BSN, RN, CWS, Kim on 01/22/2022 08:14:47 ?Haley Deleon, Haley Deleon ([id number]) ?-------------------------------------------------------------------------------- ?Compression Therapy Details ?Patient Name: Haley Deleon, Haley Deleon ?Date of Service: 01/22/2022 8:00 AM ?Medical Record Number: [id number] ?Patient Account Number: [id number] ?Date of Birth/Sex: 04-Jun-1956 (66 y.o. F) ?Treating RN: Cornell Barman ?Primary Care Arisbel Maione: Denton Lank Other Clinician: ?Referring Leoma Folds: Denton Lank ?Treating Shi Grose/Extender: Haley Deleon ?Weeks in Treatment: 3 ?Compression Therapy Performed for Wound Assessment: Wound #1 Right Lower Leg ?Performed By: Clinician Cornell Barman, RN ?Compression Type: Three Layer ?Pre Treatment ABI: 0.9 ?Post Procedure Diagnosis ?Same as Pre-procedure ?Electronic Signature(s) ?Signed: 01/22/2022 5:29:17 PM By: Gretta Cool, BSN, RN, CWS, Kim RN, BSN ?Entered By: Gretta Cool, BSN, RN, CWS, Kim on 01/22/2022 08:54:49 ?Haley Deleon, Haley Deleon  ([id number]) ?-------------------------------------------------------------------------------- ?Encounter Discharge Information Details ?Patient Name: Haley Deleon, Haley Deleon ?Date of Service: 01/22/2022 8:00 AM ?Medical Record Number: [id number] ?Patient Account Number: [id number] ?Date of Birth/Sex: [birth year] (66 y.o. F) ?Treating RN: Cornell Barman ?Primary Care Karanveer Ramakrishnan: Denton Lank Other Clinician: ?Referring Alecsander Hattabaugh: Denton Lank ?Treating Cutberto Winfree/Extender: Haley Deleon ?Weeks in Treatment: 3 ?Encounter Discharge Information Items ?Discharge Condition: Stable ?Ambulatory Status: Ambulatory ?Discharge Destination: Home ?Transportation: Private Auto ?Accompanied By: self ?Schedule Follow-up Appointment: Yes ?Clinical Summary of Care: ?Electronic Signature(s) ?Signed: 01/22/2022 9:59:19 AM By: Gretta Cool, BSN, RN, CWS, Kim RN, BSN ?Entered By: Gretta Cool, BSN, RN, CWS, Kim on 01/22/2022 09:59:19 ?Haley Deleon, Haley Deleon ([id number]) ?-------------------------------------------------------------------------------- ?Lower Extremity Assessment Details ?Patient Name: Haley Deleon, Haley Deleon ?Date of Service: 01/22/2022 8:00 AM ?Medical Record Number: [id number] ?Patient Account Number: [id number] ?Date of Birth/Sex: 1956/09/20 (66 y.o. F) ?Treating RN: Cornell Barman ?Primary Care Sheylin Scharnhorst: Denton Lank Other Clinician: ?Referring Dalaney Needle: Denton Lank ?Treating Kaileia Flow/Extender: Haley Deleon ?Weeks in Treatment: 3 ?Edema Assessment ?Assessed: [Left: No] [Right: No] ?[Left: Edema] [Right: :] ?Calf ?Left: Right: ?Point of Measurement: 38 cm From Medial Instep 50 cm ?Ankle ?Left: Right: ?Point of Measurement: 10 cm From Medial Instep 26.6 cm ?Knee To Floor ?Left: Right: ?From Medial Instep 44 cm ?Vascular Assessment ?Pulses: ?Dorsalis Pedis ?Palpable: [Right:Yes] ?Electronic Signature(s) ?Signed: 01/22/2022 9:59:57 AM By: Gretta Cool, BSN, RN, CWS, Kim RN, BSN ?Entered By: Gretta Cool, BSN, RN, CWS, Kim on 01/22/2022 09:59:56 ?Haley Deleon, Haley Deleon  ([id number]) ?-------------------------------------------------------------------------------- ?Multi Wound Chart Details ?Patient Name: Haley Deleon, Haley Deleon ?Date of Service: 01/22/2022 8:00 AM ?Medical Record Number: [id number] ?Patient Account Number: [id number] ?Date of Birth/Sex: Dec 27, 1955 (66 y.o. F) ?Treating RN: Cornell Barman ?Primary Care Jazman Reuter: Denton Lank Other Clinician: ?Referring Kaira Stringfield: Denton Lank ?Treating Alphonsa Brickle/Extender: Haley Deleon ?Weeks in Treatment: 3 ?Vital Signs ?Height(in): 67 ?Pulse(bpm): 99 ?Weight(lbs): 280 ?Blood Pressure(mmHg): 123/68 ?Body Mass Index(BMI): 43.8 ?Temperature(??F): 97.8 ?Respiratory Rate(breaths/min): 18 ?Photos: [N/A:N/A] ?Wound Location: Right Lower Leg N/A N/A ?Wounding Event: Trauma N/A N/A ?Primary Etiology: Venous Leg Ulcer N/A N/A ?Comorbid History: Coronary Artery Disease N/A N/A ?Date  Acquired: 11/19/2021 N/A N/A ?Weeks of Treatment: 3 N/A N/A ?Wound Status: Open N/A N/A ?Wound Recurrence: No N/A N/A ?Measurements L x W x D (cm) 0.9x1x0.1 N/A N/A ?Area (cm?) : 0.707 N/A N/A ?Volume (cm?) : 0.071 N/A N/A ?% Reduction in Area: 77.80% N/A N/A ?% Reduction in Volume: 88.80% N/A N/A ?Classification: Full Thickness Without Exposed N/A N/A ?Support Structures ?Exudate Amount: Medium N/A N/A ?Exudate Type: Serosanguineous N/A N/A ?Exudate Color: red, brown N/A N/A ?Granulation Amount: Medium (34-66%) N/A N/A ?Granulation Quality: Red N/A N/A ?Necrotic Amount: Medium (34-66%) N/A N/A ?Exposed Structures: ?Fat Layer (Subcutaneous Tissue): N/A N/A ?Yes ?Fascia: No ?Tendon: No ?Muscle: No ?Joint: No ?Bone: No ?Epithelialization: Small (1-33%) N/A N/A ?Treatment Notes ?Electronic Signature(s) ?Signed: 01/22/2022 5:29:17 PM By: Gretta Cool, BSN, RN, CWS, Kim RN, BSN ?Entered By: Gretta Cool, BSN, RN, CWS, Kim on 01/22/2022 82:06:01 ?Haley Deleon, Haley Deleon ([id number]) ?-------------------------------------------------------------------------------- ?Multi-Disciplinary Care Plan Details ?Patient  Name: Haley Deleon, Haley Deleon ?Date of Service: 01/22/2022 8:00 AM ?Medical Record Number: [id number] ?Patient Account Number: [id number] ?Date of Birth/Sex: 1956-06-10 (66 y.o. F) ?Treating RN: Cornell Barman ?Primary Care Cinzia Devos: Denton Lank Other Clinician: ?Referring Jadelyn Elks: Denton Lank ?Treating Charlina Dwight/Extender: Haley Deleon ?Weeks in Treatment: 3 ?Active Inactive ?Venous Leg Ulcer ?Nursing Diagnoses: ?Potential for venous Insuffiency (use before diagnosis confirmed) ?Goals: ?Patient will maintain optimal edema control ?Date Initiated: 01/22/2022 ?Target Resolution Date: 01/22/2022 ?Goal Status: Active ?Patient/caregiver will verbalize understanding of disease process and disease management ?Date Initiated: 01/22/2022 ?Target Resolution Date: 01/22/2022 ?Goal Status: Active ?Interventions: ?Compression as ordered ?Notes: ?Wound/Skin Impairment ?Nursing Diagnoses: ?Knowledge deficit related to ulceration/compromised skin integrity ?Goals: ?Patient/caregiver will verbalize understanding of skin care regimen ?Date Initiated: 12/30/2021 ?Target Resolution Date: 01/29/2022 ?Goal Status: Active ?Ulcer/skin breakdown will have a volume reduction of 30% by week 4 ?Date Initiated: 12/30/2021 ?Target Resolution Date: 01/29/2022 ?Goal Status: Active ?Ulcer/skin breakdown will have a volume reduction of 50% by week 8 ?Date Initiated: 12/30/2021 ?Target Resolution Date: 03/01/2022 ?Goal Status: Active ?Ulcer/skin breakdown will have a volume reduction of 80% by week 12 ?Date Initiated: 12/30/2021 ?Target Resolution Date: 03/31/2022 ?Goal Status: Active ?Ulcer/skin breakdown will heal within 14 weeks ?Date Initiated: 12/30/2021 ?Target Resolution Date: 05/01/2022 ?Goal Status: Active ?Interventions: ?Assess patient/caregiver ability to obtain necessary supplies ?Assess patient/caregiver ability to perform ulcer/skin care regimen upon admission and as needed ?Assess ulceration(s) every visit ?Notes: ?Electronic Signature(s) ?Signed: 01/22/2022 5:29:17  PM By: Gretta Cool, BSN, RN, CWS, Kim RN, BSN ?Entered By: Gretta Cool, BSN, RN, CWS, Kim on 01/22/2022 08:34:13 ?IVORI, STORR ([id number]) ?-------------------------------------------------------------------------------- ?Pain Assessment Details ?Patient Name: NORMAGENE, HARVIE ?Date of Service: 01/22/2022 8:00 AM ?Medical

## 2022-01-22 NOTE — Progress Notes (Addendum)
Haley, Deleon ([id number]) ?Visit Report for 01/22/2022 ?Chief Complaint Document Details ?Patient Name: Haley Deleon, Haley Deleon ?Date of Service: 01/22/2022 8:00 AM ?Medical Record Number: [id number] ?Patient Account Number: [id number] ?Date of Birth/Sex: July 24, 1956 (66 y.o. F) ?Treating RN: Cornell Barman ?Primary Care Provider: Denton Lank Other Clinician: ?Referring Provider: Denton Lank ?Treating Provider/Extender: Jeri Cos ?Weeks in Treatment: 3 ?Information Obtained from: Patient ?Chief Complaint ?Right LE Ulcer ?Electronic Signature(s) ?Signed: 01/22/2022 8:11:40 AM By: Worthy Keeler PA-C ?Entered By: Worthy Keeler on 01/22/2022 08:11:40 ?BAYLIN, CABAL ([id number]) ?-------------------------------------------------------------------------------- ?HPI Details ?Patient Name: Haley, Deleon ?Date of Service: 01/22/2022 8:00 AM ?Medical Record Number: [id number] ?Patient Account Number: [id number] ?Date of Birth/Sex: 10-09-1955 (66 y.o. F) ?Treating RN: Cornell Barman ?Primary Care Provider: Denton Lank Other Clinician: ?Referring Provider: Denton Lank ?Treating Provider/Extender: Jeri Cos ?Weeks in Treatment: 3 ?History of Present Illness ?HPI Description: 12-30-2021 upon evaluation today patient appears for initial inspection here in our clinic concerning issues that she has been ?having with a wound over the right lower extremity. She does have history of chronic venous insufficiency as well as ventricular tachycardia. She ?has had bilateral lower extremity ablations as well as sclerotherapy. With that being said the current wound started on March 1 according to what ?she tells me today. The ablations were June 17, 2020 and October 2022 I am not sure the exact date. Recently she has been on ?doxycycline from urgent care she wonders if she needs any additional antibiotics at this time. Fortunately there does not appear to be any ?evidence of active infection locally or systemically at this time which is great  news and overall very pleased in that regard. I do believe that the ?patient would benefit from appropriate dressings and wound care. I am hopeful that we will be able to get things started today and get this ?moving in a rapid progression towards complete healing. ?01-13-2022 upon evaluation today patient appears to be doing well with regard to her wound in fact this is measuring significantly smaller and it is ?again only been 1 week since she was first here in the clinic. Overall I think that this is showing excellent improvement. ?01-22-2022 upon evaluation today patient appears to be doing well with regard to her wound. She has been tolerating the dressing changes without ?complication and overall I do feel like that she is making excellent progress. There does not appear to be any signs of active infection locally or ?systemically at this time which is great news. No fevers, chills, nausea, vomiting, or diarrhea. ?Electronic Signature(s) ?Signed: 01/22/2022 5:43:57 PM By: Worthy Keeler PA-C ?Entered By: Worthy Keeler on 01/22/2022 17:43:57 ?SHAREEKA, YIM ([id number]) ?-------------------------------------------------------------------------------- ?Physical Exam Details ?Patient Name: Haley, Deleon ?Date of Service: 01/22/2022 8:00 AM ?Medical Record Number: [id number] ?Patient Account Number: [id number] ?Date of Birth/Sex: November 01, 1955 (66 y.o. F) ?Treating RN: Cornell Barman ?Primary Care Provider: Denton Lank Other Clinician: ?Referring Provider: Denton Lank ?Treating Provider/Extender: Jeri Cos ?Weeks in Treatment: 3 ?Constitutional ?Well-nourished and well-hydrated in no acute distress. ?Respiratory ?normal breathing without difficulty. ?Psychiatric ?this patient is able to make decisions and demonstrates good insight into disease process. Alert and Oriented x 3. pleasant and cooperative. ?Notes ?Upon inspection patient's wound bed again showed signs of good granulation and epithelization this was  significantly smaller and overall I do ?believe we are headed in the right direction here. ?Electronic Signature(s) ?Signed: 01/22/2022 5:44:08 PM By: Worthy Keeler PA-C ?Entered By: Worthy Keeler on 01/22/2022 17:44:08 ?Goertzen,  Glennda C. ([id number]) ?-------------------------------------------------------------------------------- ?Physician Orders Details ?Patient Name: Haley, Deleon ?Date of Service: 01/22/2022 8:00 AM ?Medical Record Number: [id number] ?Patient Account Number: [id number] ?Date of Birth/Sex: [birth year] (66 y.o. 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 ?Bathing/ Shower/ Hygiene ?o May shower with wound dressing protected with water repellent cover or cast protector. ?Edema Control - Lymphedema / Segmental Compressive Device / Other ?o Elevate, Exercise Daily and Avoid Standing for Long Periods of Time. ?o Elevate legs to the level of the heart and pump ankles as often as possible ?o Elevate leg(s) parallel to the floor when sitting. ?Wound Treatment ?Wound #1 - Lower Leg Wound Laterality: Right ?Primary Dressing: Hydrofera Blue Ready Transfer Foam, 2.5x2.5 (in/in) ?Discharge Instructions: Apply Hydrofera Blue Ready to wound bed as directed ?Secondary Dressing: ABD Pad 5x9 (in/in) ?Discharge Instructions: Cover with ABD pad ?Compression Wrap: 3-LAYER WRAP - Profore Lite LF 3 Multilayer Compression Bandaging System ?Discharge Instructions: Apply 3 multi-layer wrap as prescribed. ?Electronic Signature(s) ?Signed: 01/22/2022 5:29:17 PM By: Gretta Cool,  BSN, RN, CWS, Kim RN, BSN ?Signed: 01/23/2022 4:41:50 PM By: Worthy Keeler PA-C ?Entered By: Gretta Cool, BSN, RN, CWS, Kim on 01/22/2022 08:55:38 ?IKRAN, PATMAN ([id number]) ?-------------------------------------------------------------------------------- ?Problem List Details ?Patient Name: TALISE, SLIGH ?Date of Service: 01/22/2022 8:00 AM ?Medical Record Number: [id number] ?Patient Account Number: [id number] ?Date of Birth/Sex: 06/26/1956 (66 y.o. F) ?Treating RN: Cornell Barman ?Primary Care Provider: Denton Lank Other Clinician: ?Referring Provider: Denton Lank ?Treating Provider/Extender: Jeri Cos ?Weeks in Treatment: 3 ?Active Problems ?ICD-10 ?Encounter ?Code Description Active Date MDM ?Diagnosis ?I87.2 Venous insufficiency (chronic) (peripheral) 12/30/2021 No Yes ?F74.944 Non-pressure chronic ulcer of other part of right lower leg with fat layer 12/30/2021 No Yes ?exposed ?I47.29 Other ventricular tachycardia 12/30/2021 No Yes ?Inactive Problems ?Resolved Problems ?Electronic Signature(s) ?Signed: 01/22/2022 8:11:33 AM By: Worthy Keeler PA-C ?Entered By: Worthy Keeler on 01/22/2022 08:11:33 ?MILADY, FLEENER ([id number]) ?-------------------------------------------------------------------------------- ?Progress Note Details ?Patient Name: SHOSHANNAH, FAUBERT ?Date of Service: 01/22/2022 8:00 AM ?Medical Record Number: [id number] ?Patient Account Number: [id number] ?Date of Birth/Sex: December 06, 1955 (66 y.o. F) ?Treating RN: Cornell Barman ?Primary Care Provider: Denton Lank Other Clinician: ?Referring Provider: Denton Lank ?Treating Provider/Extender: Jeri Cos ?Weeks in Treatment: 3 ?Subjective ?Chief Complaint ?Information obtained from Patient ?Right LE Ulcer ?History of Present Illness (HPI) ?12-30-2021 upon evaluation today patient appears for initial inspection here in our clinic concerning issues that she has been having with a wound ?over the right lower extremity. She does have history of chronic venous  insufficiency as well as ventricular tachycardia. She has had bilateral ?lower extremity ablations as well as sclerotherapy. With that being said the current wound started on March 1 according to what she tells me ?t

## 2022-01-24 ENCOUNTER — Ambulatory Visit
Admission: EM | Admit: 2022-01-24 | Discharge: 2022-01-24 | Disposition: A | Discharge status: Home / Self Care | Payer: Federal, State, Local not specified - PPO | Attending: Student | Admitting: Student

## 2022-01-24 ENCOUNTER — Encounter: Payer: Self-pay | Admitting: Emergency Medicine

## 2022-01-24 ENCOUNTER — Other Ambulatory Visit: Payer: Self-pay

## 2022-01-24 DIAGNOSIS — J069 Acute upper respiratory infection, unspecified: Secondary | ICD-10-CM | POA: Diagnosis not present

## 2022-01-24 MED ORDER — PREDNISONE 10 MG (21) PO TBPK: ORAL_TABLET | Freq: Every day | ORAL | 0 refills | Status: DC

## 2022-01-24 MED ORDER — HYDROCODONE BIT-HOMATROP MBR 5-1.5 MG/5ML PO SOLN: 5.0000 mL | Freq: Four times a day (QID) | ORAL | 0 refills | Status: DC | PRN

## 2022-01-24 NOTE — ED Triage Notes (Signed)
Patient c/o cough and chest congestion for a month.  Patient would like some cough medicine and Prednisone.  Patient denies fevers.  ?

## 2022-01-24 NOTE — Discharge Instructions (Addendum)
-  Prednisone taper for cough/bronchitis. I recommend taking this in the morning as it could give you energy.  Avoid NSAIDs like ibuprofen and alleve while taking this medication as they can increase your risk of stomach upset and even GI bleeding when in combination with a steroid. You can continue tylenol (acetaminophen) up to 1061m 3x daily. ?-Hycodan syrup at night for cough. This will make you sleepy ?-Return if symptoms worsen - cough, new shortness of breath, coughing up red or dark sputum, etc.  ?

## 2022-01-24 NOTE — ED Provider Notes (Signed)
?[place] ? ? ? ?CSN: [id number] ?Arrival date & time: 01/24/22  1200 ? ? ?  ? ?History   ?Chief Complaint ?Chief Complaint  ?Patient presents with  ? Cough  ? ? ?HPI ?Haley Deleon is a 66 y.o. female presenting with cough and congestion for about 1 month.  However, it seems that her cough got a lot worse this last week after a sick exposure.  History noncontributory as below.  Describes hacking cough, typically nonproductive but occasionally productive of clear or white sputum.  There is no associated dyspnea, chest pain, dizziness, weakness, fever/chills.  States the cough is keeping her up at night.  She found a leftover dose of hydrocodone cough syrup that helped her sleep last night. ? ?HPI ? ?Past Medical History:  ?Diagnosis Date  ? Anemia   ? Atypical chest pain   ? Carotid arterial disease ([place])   ? a. 11/2017 Carotid U/S: RICA <22, LICA nl.  ? Claustrophobia   ? NO MEDS  ? Diastolic dysfunction   ? a. 11/2017 Echo: EF 60-65%, no rwma, Gr1 DD. Nl RV fxn.  ? Family history of adverse reaction to anesthesia   ? 1ST COUSIN-WAKE UP DURING SURGERY  ? Liver cyst   ? Ulcerative colitis ([place])   ? Venous insufficiency   ? ? ?Patient Active Problem List  ? Diagnosis Date Noted  ? PSVT (paroxysmal supraventricular tachycardia) ([place]) 07/17/2021  ? Palpitations 02/24/2021  ? NSVT (nonsustained ventricular tachycardia) ([place]) 02/24/2021  ? Palpitation 02/24/2021  ? 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 Varicose veins of leg with pain, left 06/14/2020  ? Dizziness 11/17/2017  ? Murmur, cardiac 11/17/2017  ? Screening for cardiovascular condition 11/17/2017  ? Lower extremity edema 11/17/2017  ? Gallstones and inflammation of gallbladder without obstruction   ? Incisional hernia, without obstruction or gangrene   ? History of biliary T-tube placement 02/24/2014  ? Cellulitis 02/24/2014  ? Cellulitis and abscess 02/24/2014  ? Ulcerative colitis ([place]) 08/22/2013  ? ? ?Past Surgical  History:  ?Procedure Laterality Date  ? BREAST BIOPSY Right 11/02/2011  ? ultrasound guided biopsy/clip-benign  ? CHOLECYSTECTOMY N/A 07/29/2017  ? Procedure: LAPAROSCOPIC CHOLECYSTECTOMY;  Surgeon: Jules Husbands, MD;  Location: [facility] ORS;  Service: General;  Laterality: N/A;  ? COLON SURGERY  2014, 2015  ? DUE TO ULERATIVE COLITIS WITH COLOSTOMY  ? COLOSTOMY TAKEDOWN    ? FOOT SURGERY Bilateral   ? BONE SPURS   ? [place]  ? MOUTH SURGERY    ? VENTRAL HERNIA REPAIR N/A 07/29/2017  ? Procedure: LAPAROSCOPIC VENTRAL HERNIA;  Surgeon: Jules Husbands, MD;  Location: [facility] ORS;  Service: General;  Laterality: N/A;  ? ? ?OB History   ?No obstetric history on file. ?  ? ? ? ?Home Medications   ? ?Prior to Admission medications   ?Medication Sig Start Date End Date Taking? Authorizing Provider  ?ferrous sulfate 325 (65 FE) MG tablet Take 325 mg by mouth daily as needed.    Yes [provider]  ?FLUoxetine (PROZAC) 10 MG tablet Take 10 mg by mouth daily.   Yes [provider]  ?HYDROcodone bit-homatropine (HYCODAN) 5-1.5 MG/5ML syrup Take 5 mLs by mouth every 6 (six) hours as needed for cough. 01/24/22  Yes Hazel Sams, PA-C  ?hydrOXYzine (ATARAX) 10 MG tablet Take 10 mg by mouth daily.   Yes [provider]  ?Multiple Vitamin (MULTIVITAMIN) tablet Take 1 tablet  by mouth daily.   Yes [provider]  ?predniSONE (STERAPRED UNI-PAK 21 TAB) 10 MG (21) TBPK tablet Take by mouth daily. Take 6 tabs by mouth daily  for 2 days, then 5 tabs for 2 days, then 4 tabs for 2 days, then 3 tabs for 2 days, 2 tabs for 2 days, then 1 tab by mouth daily for 2 days 01/24/22  Yes Hazel Sams, PA-C  ?escitalopram (LEXAPRO) 10 MG tablet Take 10 mg by mouth daily.    [provider]  ?loperamide (IMODIUM) 2 MG capsule Take 2 mg as needed by mouth for diarrhea or loose stools (NORMALLY TAKES 2-3 TIMES DAILY SINCE HAVING HER COLON SURGERY).     [provider]  ? ? ?Family  History ?Family History  ?Problem Relation Age of Onset  ? Heart attack Mother 54  ? Hyperlipidemia Brother   ? Breast cancer Neg Hx   ? ? ?Social History ?Social History  ? ?Tobacco Use  ? Smoking status: Former  ?  Packs/day: 1.00  ?  Years: 20.00  ?  Pack years: 20.00  ?  Types: Cigarettes  ?  Quit date: 07/28/2005  ?  Years since quitting: 16.5  ? Smokeless tobacco: Never  ?Vaping Use  ? Vaping Use: Never used  ?Substance Use Topics  ? Alcohol use: Yes  ?  Comment: RARE WINE  ? Drug use: No  ? ? ? ?Allergies   ?Caffeine, Chocolate, Chocolate flavor, Cocoa, and Meloxicam ? ? ?Review of Systems ?Review of Systems  ?Constitutional:  Negative for appetite change, chills and fever.  ?HENT:  Positive for congestion. Negative for ear pain, rhinorrhea, sinus pressure, sinus pain and sore throat.   ?Eyes:  Negative for redness and visual disturbance.  ?Respiratory:  Positive for cough. Negative for chest tightness, shortness of breath and wheezing.   ?Cardiovascular:  Negative for chest pain and palpitations.  ?Gastrointestinal:  Negative for abdominal pain, constipation, diarrhea, nausea and vomiting.  ?Genitourinary:  Negative for dysuria, frequency and urgency.  ?Musculoskeletal:  Negative for myalgias.  ?Neurological:  Negative for dizziness, weakness and headaches.  ?Psychiatric/Behavioral:  Negative for confusion.   ?All other systems reviewed and are negative. ? ? ?Physical Exam ?Triage Vital Signs ?ED Triage Vitals  ?Enc Vitals Group  ?   BP 01/24/22 1214 131/86  ?   Pulse Rate 01/24/22 1214 98  ?   Resp 01/24/22 1214 15  ?   Temp 01/24/22 1214 98.5 ?F (36.9 ?C)  ?   Temp Source 01/24/22 1214 Oral  ?   SpO2 01/24/22 1214 95 %  ?   Weight 01/24/22 1209 250 lb (113.4 kg)  ?   Height 01/24/22 1209 5' 7"  (1.702 m)  ?   Head Circumference --   ?   Peak Flow --   ?   Pain Score 01/24/22 1209 0  ?   Pain Loc --   ?   Pain Edu? --   ?   Excl. in [place]? --   ? ?No data found. ? ?Updated Vital Signs ?BP 131/86 (BP Location:  Left Arm)   Pulse 98   Temp 98.5 ?F (36.9 ?C) (Oral)   Resp 15   Ht 5' 7"  (1.702 m)   Wt 250 lb (113.4 kg)   SpO2 95%   BMI 39.16 kg/m?  ? ?Visual Acuity ?Right Eye Distance:   ?Left Eye Distance:   ?Bilateral Distance:   ? ?Right Eye Near:   ?Left Eye Near:    ?  Bilateral Near:    ? ?Physical Exam ?Vitals reviewed.  ?Constitutional:   ?   General: She is not in acute distress. ?   Appearance: Normal appearance. She is not ill-appearing.  ?HENT:  ?   Head: Normocephalic and atraumatic.  ?   Right Ear: Tympanic membrane, ear canal and external ear normal. No tenderness. No middle ear effusion. There is no impacted cerumen. Tympanic membrane is not perforated, erythematous, retracted or bulging.  ?   Left Ear: Tympanic membrane, ear canal and external ear normal. No tenderness.  No middle ear effusion. There is no impacted cerumen. Tympanic membrane is not perforated, erythematous, retracted or bulging.  ?   Nose: Nose normal. No congestion.  ?   Mouth/Throat:  ?   Mouth: Mucous membranes are moist.  ?   Pharynx: Uvula midline. No oropharyngeal exudate or posterior oropharyngeal erythema.  ?Eyes:  ?   Extraocular Movements: Extraocular movements intact.  ?   Pupils: Pupils are equal, round, and reactive to light.  ?Cardiovascular:  ?   Rate and Rhythm: Normal rate and regular rhythm.  ?   Heart sounds: Normal heart sounds.  ?Pulmonary:  ?   Effort: Pulmonary effort is normal.  ?   Breath sounds: Normal breath sounds. No decreased breath sounds, wheezing, rhonchi or rales.  ?   Comments: Freq cough ?Abdominal:  ?   Palpations: Abdomen is soft.  ?   Tenderness: There is no abdominal tenderness. There is no guarding or rebound.  ?Lymphadenopathy:  ?   Cervical: No cervical adenopathy.  ?   Right cervical: No superficial cervical adenopathy. ?   Left cervical: No superficial cervical adenopathy.  ?Neurological:  ?   General: No focal deficit present.  ?   Mental Status: She is alert and oriented to person, place,  and time.  ?Psychiatric:     ?   Mood and Affect: Mood normal.     ?   Behavior: Behavior normal.     ?   Thought Content: Thought content normal.     ?   Judgment: Judgment normal.  ? ? ? ?UC Treatments / Resu

## 2022-01-29 ENCOUNTER — Encounter: Payer: Federal, State, Local not specified - PPO | Admitting: Physician Assistant

## 2022-01-29 DIAGNOSIS — Z09 Encounter for follow-up examination after completed treatment for conditions other than malignant neoplasm: Secondary | ICD-10-CM | POA: Diagnosis not present

## 2022-01-29 NOTE — Progress Notes (Addendum)
TAKYRA, CANTRALL ([id number]) ?Visit Report for 01/29/2022 ?Chief Complaint Document Details ?Patient Name: Haley Deleon, Haley Deleon ?Date of Service: 01/29/2022 8:30 AM ?Medical Record Number: [id number] ?Patient Account Number: [id number] ?Date of Birth/Sex: 23-Oct-1955 (66 y.o. F) ?Treating RN: Donnamarie Poag ?Primary Care Provider: Denton Lank Other Clinician: ?Referring Provider: Denton Lank ?Treating Provider/Extender: Jeri Cos ?Weeks in Treatment: 4 ?Information Obtained from: Patient ?Chief Complaint ?Right LE Ulcer ?Electronic Signature(s) ?Signed: 01/29/2022 8:35:33 AM By: Worthy Keeler PA-C ?Entered By: Worthy Keeler on 01/29/2022 08:35:32 ?Haley Deleon, Haley Deleon ([id number]) ?-------------------------------------------------------------------------------- ?HPI Details ?Patient Name: Haley Deleon, Haley Deleon ?Date of Service: 01/29/2022 8:30 AM ?Medical Record Number: [id number] ?Patient Account Number: [id number] ?Date of Birth/Sex: [birth year] (66 y.o. F) ?Treating RN: Donnamarie Poag ?Primary Care Provider: Denton Lank Other Clinician: ?Referring Provider: Denton Lank ?Treating Provider/Extender: Jeri Cos ?Weeks in Treatment: 4 ?History of Present Illness ?HPI Description: 12-30-2021 upon evaluation today patient appears for initial inspection here in our clinic concerning issues that she has been ?having with a wound over the right lower extremity. She does have history of chronic venous insufficiency as well as ventricular tachycardia. She ?has had bilateral lower extremity ablations as well as sclerotherapy. With that being said the current wound started on March 1 according to what ?she tells me today. The ablations were June 17, 2020 and October 2022 I am not sure the exact date. Recently she has been on ?doxycycline from urgent care she wonders if she needs any additional antibiotics at this time. Fortunately there does not appear to be any ?evidence of active infection locally or systemically at this time which is  great news and overall very pleased in that regard. I do believe that the ?patient would benefit from appropriate dressings and wound care. I am hopeful that we will be able to get things started today and get this ?moving in a rapid progression towards complete healing. ?01-13-2022 upon evaluation today patient appears to be doing well with regard to her wound in fact this is measuring significantly smaller and it is ?again only been 1 week since she was first here in the clinic. Overall I think that this is showing excellent improvement. ?01-22-2022 upon evaluation today patient appears to be doing well with regard to her wound. She has been tolerating the dressing changes without ?complication and overall I do feel like that she is making excellent progress. There does not appear to be any signs of active infection locally or ?systemically at this time which is great news. No fevers, chills, nausea, vomiting, or diarrhea. ?01-29-2022 upon evaluation today patient appears to be doing well currently in regard to her wound this is significantly smaller and getting very ?close to resolution. I am very pleased with where we stand and I do believe this looks to be doing excellent. ?Electronic Signature(s) ?Signed: 01/30/2022 8:01:13 AM By: Worthy Keeler PA-C ?Entered By: Worthy Keeler on 01/30/2022 08:01:13 ?Haley Deleon, Haley Deleon ([id number]) ?-------------------------------------------------------------------------------- ?Physical Exam Details ?Patient Name: Haley Deleon, Haley Deleon ?Date of Service: 01/29/2022 8:30 AM ?Medical Record Number: [id number] ?Patient Account Number: [id number] ?Date of Birth/Sex: 10/19/1955 (66 y.o. F) ?Treating RN: Donnamarie Poag ?Primary Care Provider: Denton Lank Other Clinician: ?Referring Provider: Denton Lank ?Treating Provider/Extender: Jeri Cos ?Weeks in Treatment: 4 ?Constitutional ?Obese and well-hydrated in no acute distress. ?Respiratory ?normal breathing without  difficulty. ?Psychiatric ?this patient is able to make decisions and demonstrates good insight into disease process. Alert and Oriented x 3. pleasant and cooperative. ?Notes ?Upon inspection patient's wound bed showed  signs of good granulation and epithelization at this point. Fortunately I do not see any evidence of ?infection locally nor systemically which is great news I am very pleased with the fact that the patient does seem to be making excellent progress ?here. ?Electronic Signature(s) ?Signed: 01/30/2022 8:01:33 AM By: Worthy Keeler PA-C ?Entered By: Worthy Keeler on 01/30/2022 08:01:33 ?Haley Deleon, Haley Deleon ([id number]) ?-------------------------------------------------------------------------------- ?Physician Orders Details ?Patient Name: Haley Deleon, Haley Deleon ?Date of Service: 01/29/2022 8:30 AM ?Medical Record Number: [id number] ?Patient Account Number: [id number] ?Date of Birth/Sex: [birth year] (66 y.o. 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 ?o Nurse Visit as needed ?Bathing/ Shower/ Hygiene ?o May shower with wound dressing protected with water repellent cover or cast protector. ?o No tub bath. ?Anesthetic (Use 'Patient Medications' Section for Anesthetic Order Entry) ?o Lidocaine applied to wound bed ?Edema Control - Lymphedema / Segmental Compressive Device / Other ?o Elevate, Exercise Daily and Avoid Standing for Long Periods of Time. ?o Elevate legs to the level of the heart and pump ankles as often as possible ?o Elevate leg(s) parallel to  the floor when sitting. ?Wound Treatment ?Wound #1 - Lower Leg Wound Laterality: Right ?Cleanser: Soap and Water 1 x Per Week/30 Days ?Discharge Instructions: Gently cleanse wound with antibacterial soap, rinse and pat dry prior to dressing wounds ?Cleanser: Wound Cleanser 1 x Per Week/30 Days ?Discharge Instructions: Wash your hands with soap and water. Remove old dressing, discard into plastic bag and place into trash. ?Cleanse the wound with Wound Cleanser prior to applying a clean dressing using gauze sponges, not tissues or cotton balls. Do not ?scrub or use excessive force. Pat dry using gauze sponges, not tissue or cotton balls. ?Primary Dressing: Curad Oil Emulsion Dressing 3x3 (in/in) 1 x Per Week/30 Days ?Secondary Dressing: ABD Pad 5x9 (in/in) 1 x Per Week/30 Days ?Discharge Instructions: Cover with ABD pad ?Compression Wrap: 3-LAYER WRAP - Profore Lite LF 3 Multilayer Compression Bandaging System 1 x Per Week/30 Days ?Discharge Instructions: Apply 3 multi-layer wrap as prescribed. ?Electronic Signature(s) ?Signed: 01/29/2022 3:52:15 PM By: Donnamarie Poag ?Signed: 01/30/2022 8:03:27 AM By: Worthy Keeler PA-C ?Entered ByDonnamarie Poag on 01/29/2022 09:12:13 ?Haley Deleon, Haley Deleon ([id number]) ?-------------------------------------------------------------------------------- ?Problem List Details ?Patient Name: Haley Deleon, Haley Deleon ?Date of Service: 01/29/2022 8:30 AM ?Medical Record Number: [id number] ?Patient Account Number: [id number] ?Date of Birth/Sex: 1956-09-13 (66 y.o. F) ?Treating RN: Donnamarie Poag ?Primary Care Provider: Denton Lank Other Clinician: ?Referring Provider: Denton Lank ?Treating Provider/Extender: Jeri Cos ?Weeks in Treatment: 4 ?Active Problems ?ICD-10 ?Encounter ?Code Description Active Date MDM ?Diagnosis ?I87.2 Venous insufficiency (chronic) (peripheral) 12/30/2021 No Yes ?M21.115 Non-pressure chronic ulcer of other part of right lower leg with fat layer 12/30/2021 No Yes ?exposed ?I47.29 Other  ventricular tachycardia 12/30/2021 No Yes ?Inactive Problems ?Resolved Problems ?Electronic Signature(s) ?Signed: 01/29/2022 8:35:28 AM By: Worthy Keeler PA-C ?Entered By: Worthy Keeler on 01/29/2022 08:35:28 ?Wherley, Kenlee C. (

## 2022-01-29 NOTE — Progress Notes (Signed)
CHARLOT, GOUIN ([id number]) ?Visit Report for 01/29/2022 ?Arrival Information Details ?Patient Name: Haley Deleon, Haley Deleon ?Date of Service: 01/29/2022 8:30 AM ?Medical Record Number: [id number] ?Patient Account Number: [id number] ?Date of Birth/Sex: Nov 12, 1955 (66 y.o. F) ?Treating RN: Donnamarie Poag ?Primary Care Alexandru Moorer: Denton Lank Other Clinician: ?Referring Danne Vasek: Denton Lank ?Treating Audry Kauzlarich/Extender: Jeri Cos ?Weeks in Treatment: 4 ?Visit Information History Since Last Visit ?Added or deleted any medications: No ?Patient Arrived: Ambulatory ?Had a fall or experienced change in No ?Arrival Time: 08:32 ?activities of daily living that may affect ?Accompanied By: self ?risk of falls: ?Transfer Assistance: None ?Hospitalized since last visit: No ?Patient Identification Verified: Yes ?Has Dressing in Place as Prescribed: Yes ?Secondary Verification Process Completed: Yes ?Pain Present Now: No ?Patient Requires Transmission-Based Precautions: No ?Patient Has Alerts: Yes ?Patient Alerts: NOT Diabetic ?Electronic Signature(s) ?Signed: 01/29/2022 3:52:15 PM By: Donnamarie Poag ?Entered ByDonnamarie Poag on 01/29/2022 08:33:33 ?ODIS, WICKEY ([id number]) ?-------------------------------------------------------------------------------- ?Compression Therapy Details ?Patient Name: Haley Deleon, Haley Deleon ?Date of Service: 01/29/2022 8:30 AM ?Medical Record Number: [id number] ?Patient Account Number: [id number] ?Date of Birth/Sex: 05/18/1956 (66 y.o. F) ?Treating RN: Donnamarie Poag ?Primary Care Stevenson Windmiller: Denton Lank Other Clinician: ?Referring Vernon Ariel: Denton Lank ?Treating Nathaniel Yaden/Extender: Jeri Cos ?Weeks in Treatment: 4 ?Compression Therapy Performed for Wound Assessment: Wound #1 Right Lower Leg ?Performed By: Clinician Donnamarie Poag, RN ?Compression Type: Three Layer ?Post Procedure Diagnosis ?Same as Pre-procedure ?Electronic Signature(s) ?Signed: 01/29/2022 3:52:15 PM By: Donnamarie Poag ?Entered ByDonnamarie Poag on 01/29/2022  09:11:20 ?Haley Deleon, Haley Deleon ([id number]) ?-------------------------------------------------------------------------------- ?Encounter Discharge Information Details ?Patient Name: Haley Deleon, Haley Deleon ?Date of Service: 01/29/2022 8:30 AM ?Medical Record Number: [id number] ?Patient Account Number: [id number] ?Date of Birth/Sex: July 23, 1956 (66 y.o. F) ?Treating RN: Donnamarie Poag ?Primary Care Adolpho Meenach: Denton Lank Other Clinician: ?Referring Linzi Ohlinger: Denton Lank ?Treating Dowell Hoon/Extender: Jeri Cos ?Weeks in Treatment: 4 ?Encounter Discharge Information Items ?Discharge Condition: Stable ?Ambulatory Status: Ambulatory ?Discharge Destination: Home ?Transportation: Private Auto ?Accompanied By: self ?Schedule Follow-up Appointment: Yes ?Clinical Summary of Care: ?Electronic Signature(s) ?Signed: 01/29/2022 3:52:15 PM By: Donnamarie Poag ?Entered ByDonnamarie Poag on 01/29/2022 09:13:13 ?Haley Deleon, Haley Deleon ([id number]) ?-------------------------------------------------------------------------------- ?Lower Extremity Assessment Details ?Patient Name: Haley Deleon, Haley Deleon ?Date of Service: 01/29/2022 8:30 AM ?Medical Record Number: [id number] ?Patient Account Number: [id number] ?Date of Birth/Sex: 1956/07/31 (66 y.o. F) ?Treating RN: Donnamarie Poag ?Primary Care Nathaly Dawkins: Denton Lank Other Clinician: ?Referring Pasha Broad: Denton Lank ?Treating Shun Pletz/Extender: Jeri Cos ?Weeks in Treatment: 4 ?Edema Assessment ?Assessed: [Left: No] [Right: Yes] ?[Left: Edema] [Right: :] ?Calf ?Left: Right: ?Point of Measurement: 38 cm From Medial Instep 46 cm ?Ankle ?Left: Right: ?Point of Measurement: 10 cm From Medial Instep 26 cm ?Electronic Signature(s) ?Signed: 01/29/2022 3:52:15 PM By: Donnamarie Poag ?Entered ByDonnamarie Poag on 01/29/2022 08:37:52 ?Haley Deleon, Haley Deleon ([id number]) ?-------------------------------------------------------------------------------- ?Multi Wound Chart Details ?Patient Name: Haley Deleon, Haley Deleon ?Date of Service: 01/29/2022 8:30  AM ?Medical Record Number: [id number] ?Patient Account Number: [id number] ?Date of Birth/Sex: 1955-11-07 (66 y.o. F) ?Treating RN: Donnamarie Poag ?Primary Care Navarre Diana: Denton Lank Other Clinician: ?Referring Jasiyah Paulding: Denton Lank ?Treating Nyaja Dubuque/Extender: Jeri Cos ?Weeks in Treatment: 4 ?Vital Signs ?Height(in): 67 ?Pulse(bpm): 96 ?Weight(lbs): 280 ?Blood Pressure(mmHg): 141/88 ?Body Mass Index(BMI): 43.8 ?Temperature(??F): 97.7 ?Respiratory Rate(breaths/min): 16 ?Photos: [N/A:N/A] ?Wound Location: Right Lower Leg N/A N/A ?Wounding Event: Trauma N/A N/A ?Primary Etiology: Venous Leg Ulcer N/A N/A ?Comorbid History: Coronary Artery Disease N/A N/A ?Date Acquired: 11/19/2021 N/A N/A ?Weeks of Treatment: 4 N/A N/A ?Wound Status: Open N/A N/A ?Wound Recurrence: No N/A N/A ?Measurements L x  W x D (cm) 0.3x0.2x0.1 N/A N/A ?Area (cm?) : 0.047 N/A N/A ?Volume (cm?) : 0.005 N/A N/A ?% Reduction in Area: 98.50% N/A N/A ?% Reduction in Volume: 99.20% N/A N/A ?Classification: Full Thickness Without Exposed N/A N/A ?Support Structures ?Exudate Amount: Medium N/A N/A ?Exudate Type: Serosanguineous N/A N/A ?Exudate Color: red, brown N/A N/A ?Granulation Amount: Medium (34-66%) N/A N/A ?Granulation Quality: Red N/A N/A ?Necrotic Amount: Medium (34-66%) N/A N/A ?Exposed Structures: ?Fat Layer (Subcutaneous Tissue): N/A N/A ?Yes ?Fascia: No ?Tendon: No ?Muscle: No ?Joint: No ?Bone: No ?Epithelialization: Small (1-33%) N/A N/A ?Treatment Notes ?Electronic Signature(s) ?Signed: 01/29/2022 3:52:15 PM By: Donnamarie Poag ?Entered ByDonnamarie Poag on 01/29/2022 08:38:15 ?Haley Deleon, Haley Deleon ([id number]) ?-------------------------------------------------------------------------------- ?Multi-Disciplinary Care Plan Details ?Patient Name: Haley Deleon, Haley Deleon ?Date of Service: 01/29/2022 8:30 AM ?Medical Record Number: [id number] ?Patient Account Number: [id number] ?Date of Birth/Sex: Dec 04, 1955 (66 y.o. F) ?Treating RN: Donnamarie Poag ?Primary Care  Rein Popov: Denton Lank Other Clinician: ?Referring Ellias Mcelreath: Denton Lank ?Treating Raigen Jagielski/Extender: Jeri Cos ?Weeks in Treatment: 4 ?Active Inactive ?Venous Leg Ulcer ?Nursing Diagnoses: ?Potential for venous Insuffiency (use before diagnosis confirmed) ?Goals: ?Patient will maintain optimal edema control ?Date Initiated: 01/22/2022 ?Target Resolution Date: 01/22/2022 ?Goal Status: Active ?Patient/caregiver will verbalize understanding of disease process and disease management ?Date Initiated: 01/22/2022 ?Target Resolution Date: 01/22/2022 ?Goal Status: Active ?Interventions: ?Compression as ordered ?Notes: ?Wound/Skin Impairment ?Nursing Diagnoses: ?Knowledge deficit related to ulceration/compromised skin integrity ?Goals: ?Patient/caregiver will verbalize understanding of skin care regimen ?Date Initiated: 12/30/2021 ?Date Inactivated: 01/29/2022 ?Target Resolution Date: 01/29/2022 ?Goal Status: Met ?Ulcer/skin breakdown will have a volume reduction of 30% by week 4 ?Date Initiated: 12/30/2021 ?Target Resolution Date: 01/29/2022 ?Goal Status: Active ?Ulcer/skin breakdown will have a volume reduction of 50% by week 8 ?Date Initiated: 12/30/2021 ?Target Resolution Date: 03/01/2022 ?Goal Status: Active ?Ulcer/skin breakdown will have a volume reduction of 80% by week 12 ?Date Initiated: 12/30/2021 ?Target Resolution Date: 03/31/2022 ?Goal Status: Active ?Ulcer/skin breakdown will heal within 14 weeks ?Date Initiated: 12/30/2021 ?Target Resolution Date: 05/01/2022 ?Goal Status: Active ?Interventions: ?Assess patient/caregiver ability to obtain necessary supplies ?Assess patient/caregiver ability to perform ulcer/skin care regimen upon admission and as needed ?Assess ulceration(s) every visit ?Notes: ?Electronic Signature(s) ?Signed: 01/29/2022 3:52:15 PM By: Donnamarie Poag ?Entered ByDonnamarie Poag on 01/29/2022 08:38:06 ?Haley Deleon, Haley Deleon  ([id number]) ?-------------------------------------------------------------------------------- ?Pain Assessment Details ?Patient Name: Haley Deleon, Haley Deleon ?Date of Service: 01/29/2022 8:30 AM ?Medical Record Number: [id number] ?Patient Account Number: [id number] ?Date of Birth/Sex: [birth year] (66 y.o. F) ?Treating RN:

## 2022-02-05 ENCOUNTER — Encounter: Payer: Federal, State, Local not specified - PPO | Admitting: Physician Assistant

## 2022-02-05 DIAGNOSIS — Z09 Encounter for follow-up examination after completed treatment for conditions other than malignant neoplasm: Secondary | ICD-10-CM | POA: Diagnosis not present

## 2022-02-05 NOTE — Progress Notes (Addendum)
LENER, VENTRESCA ([id number]) Visit Report for 02/05/2022 Chief Complaint Document Details Patient Name: Haley Deleon, Haley Deleon Date of Service: 02/05/2022 8:15 AM Medical Record Number: [id number] Patient Account Number: [id number] Date of Birth/Sex: 28-Dec-1955 (66 y.o. F) Treating RN: Levora Dredge Primary Care Provider: Denton Lank Other Clinician: Referring Provider: Denton Lank Treating Provider/Extender: Skipper Cliche in Treatment: 5 Information Obtained from: Patient Chief Complaint Right LE Ulcer Electronic Signature(s) Signed: 02/05/2022 8:30:07 AM By: Worthy Keeler PA-C Entered By: Worthy Keeler on 02/05/2022 08:30:07 Haley Deleon ([id number]) -------------------------------------------------------------------------------- HPI Details Patient Name: Haley Deleon Date of Service: 02/05/2022 8:15 AM Medical Record Number: [id number] Patient Account Number: [id number] Date of Birth/Sex: [birth year] (66 y.o. F) Treating RN: Levora Dredge Primary Care Provider: Denton Lank Other Clinician: Referring Provider: Denton Lank Treating Provider/Extender: Skipper Cliche in Treatment: 5 History of Present Illness HPI Description: 12-30-2021 upon evaluation today patient appears for initial inspection here in our clinic concerning issues that she has been having with a wound over the right lower extremity. She does have history of chronic venous insufficiency as well as ventricular tachycardia. She has had bilateral lower extremity ablations as well as sclerotherapy. With that being said the current wound started on March 1 according to what she tells me today. The ablations were June 17, 2020 and October 2022 I am not sure the exact date. Recently she has been on doxycycline from urgent care she wonders if she needs any additional antibiotics at this time. Fortunately there does not appear to be any evidence of active infection locally or systemically at this time  which is great news and overall very pleased in that regard. I do believe that the patient would benefit from appropriate dressings and wound care. I am hopeful that we will be able to get things started today and get this moving in a rapid progression towards complete healing. 01-13-2022 upon evaluation today patient appears to be doing well with regard to her wound in fact this is measuring significantly smaller and it is again only been 1 week since she was first here in the clinic. Overall I think that this is showing excellent improvement. 01-22-2022 upon evaluation today patient appears to be doing well with regard to her wound. She has been tolerating the dressing changes without complication and overall I do feel like that she is making excellent progress. There does not appear to be any signs of active infection locally or systemically at this time which is great news. No fevers, chills, nausea, vomiting, or diarrhea. 01-29-2022 upon evaluation today patient appears to be doing well currently in regard to her wound this is significantly smaller and getting very close to resolution. I am very pleased with where we stand and I do believe this looks to be doing excellent. 02-05-2022 upon evaluation today patient actually appears to be healed based on what I am seeing currently. There does not appear to be any evidence of active infection at this time which is great news. No fevers, chills, nausea, vomiting, or diarrhea. Electronic Signature(s) Signed: 02/05/2022 9:16:37 AM By: Worthy Keeler PA-C Entered By: Worthy Keeler on 02/05/2022 09:16:37 Haley Deleon ([id number]) -------------------------------------------------------------------------------- Physical Exam Details Patient Name: Haley Deleon Date of Service: 02/05/2022 8:15 AM Medical Record Number: [id number] Patient Account Number: [id number] Date of Birth/Sex: 1956-04-09 (66 y.o. F) Treating RN: Levora Dredge Primary Care  Provider: Denton Lank Other Clinician: Referring Provider: Denton Lank Treating Provider/Extender: Jeri Cos Weeks in Treatment: 5 Constitutional  Well-nourished and well-hydrated in no acute distress. Respiratory normal breathing without difficulty. Psychiatric this patient is able to make decisions and demonstrates good insight into disease process. Alert and Oriented x 3. pleasant and cooperative. Notes [name] wound currently is showing signs of excellent improvement with regard to the wound in fact her leg appears to be completely healed which is great news. Electronic Signature(s) Signed: 02/05/2022 9:16:49 AM By: Worthy Keeler PA-C Entered By: Worthy Keeler on 02/05/2022 09:16:49 Haley Deleon ([id number]) -------------------------------------------------------------------------------- Physician Orders Details Patient Name: Haley Deleon Date of Service: 02/05/2022 8:15 AM Medical Record Number: [id number] Patient Account Number: [id number] Date of Birth/Sex: [birth year] (66 y.o. F) Treating RN: Levora Dredge Primary Care Provider: Denton Lank Other Clinician: Referring Provider: Denton Lank Treating Provider/Extender: Skipper Cliche in Treatment: 5 Verbal / Phone Orders: No Diagnosis Coding ICD-10 Coding Code Description I87.2 Venous insufficiency (chronic) (peripheral) L97.812 Non-pressure chronic ulcer of other part of right lower leg with fat layer exposed I47.29 Other ventricular tachycardia Discharge From [place] o Discharge from [employer] Treatment Complete - lotion legs dailywith any of these lotion options, Eucerine, aveno or cetafill. Please call clinic with any issues to healed wound. o Wear compression garments daily. Put garments on first thing when you wake up and remove them before bed. o Moisturize legs daily after removing compression garments. o Elevate, Exercise Daily and Avoid Standing for Long Periods of  Time. o DO YOUR BEST to sleep in the bed at night. DO NOT sleep in your recliner. Long hours of sitting in a recliner leads to swelling of the legs and/or potential wounds on your backside. Electronic Signature(s) Signed: 02/05/2022 5:01:35 PM By: Levora Dredge Signed: 02/05/2022 5:51:16 PM By: Worthy Keeler PA-C Entered By: Levora Dredge on 02/05/2022 08:44:15 FLOR, WHITACRE ([id number]) -------------------------------------------------------------------------------- Problem List Details Patient Name: Haley Deleon Date of Service: 02/05/2022 8:15 AM Medical Record Number: [id number] Patient Account Number: [id number] Date of Birth/Sex: 24-Nov-1955 (66 y.o. F) Treating RN: Levora Dredge Primary Care Provider: Denton Lank Other Clinician: Referring Provider: Denton Lank Treating Provider/Extender: Jeri Cos Weeks in Treatment: 5 Active Problems ICD-10 Encounter Code Description Active Date MDM Diagnosis I87.2 Venous insufficiency (chronic) (peripheral) 12/30/2021 No Yes L97.812 Non-pressure chronic ulcer of other part of right lower leg with fat layer 12/30/2021 No Yes exposed I47.29 Other ventricular tachycardia 12/30/2021 No Yes Inactive Problems Resolved Problems Electronic Signature(s) Signed: 02/05/2022 8:30:01 AM By: Worthy Keeler PA-C Entered By: Worthy Keeler on 02/05/2022 08:30:00 Haley Deleon ([id number]) -------------------------------------------------------------------------------- Progress Note Details Patient Name: Haley Deleon Date of Service: 02/05/2022 8:15 AM Medical Record Number: [id number] Patient Account Number: [id number] Date of Birth/Sex: 07-24-1956 (66 y.o. F) Treating RN: Levora Dredge Primary Care Provider: Denton Lank Other Clinician: Referring Provider: Denton Lank Treating Provider/Extender: Skipper Cliche in Treatment: 5 Subjective Chief Complaint Information obtained from Patient Right LE Ulcer History of  Present Illness (HPI) 12-30-2021 upon evaluation today patient appears for initial inspection here in our clinic concerning issues that she has been having with a wound over the right lower extremity. She does have history of chronic venous insufficiency as well as ventricular tachycardia. She has had bilateral lower extremity ablations as well as sclerotherapy. With that being said the current wound started on March 1 according to what she tells me today. The ablations were June 17, 2020 and October 2022 I am not sure the exact date. Recently she has been on doxycycline from urgent care  she wonders if she needs any additional antibiotics at this time. Fortunately there does not appear to be any evidence of active infection locally or systemically at this time which is great news and overall very pleased in that regard. I do believe that the patient would benefit from appropriate dressings and wound care. I am hopeful that we will be able to get things started today and get this moving in a rapid progression towards complete healing. 01-13-2022 upon evaluation today patient appears to be doing well with regard to her wound in fact this is measuring significantly smaller and it is again only been 1 week since she was first here in the clinic. Overall I think that this is showing excellent improvement. 01-22-2022 upon evaluation today patient appears to be doing well with regard to her wound. She has been tolerating the dressing changes without complication and overall I do feel like that she is making excellent progress. There does not appear to be any signs of active infection locally or systemically at this time which is great news. No fevers, chills, nausea, vomiting, or diarrhea. 01-29-2022 upon evaluation today patient appears to be doing well currently in regard to her wound this is significantly smaller and getting very close to resolution. I am very pleased with where we stand and I do believe  this looks to be doing excellent. 02-05-2022 upon evaluation today patient actually appears to be healed based on what I am seeing currently. There does not appear to be any evidence of active infection at this time which is great news. No fevers, chills, nausea, vomiting, or diarrhea. Objective Constitutional Well-nourished and well-hydrated in no acute distress. Vitals Time Taken: 8:20 AM, Height: 67 in, Weight: 280 lbs, BMI: 43.8, Temperature: 97.7 F, Pulse: 89 bpm, Respiratory Rate: 18 breaths/min, Blood Pressure: 154/96 mmHg. Respiratory normal breathing without difficulty. Psychiatric this patient is able to make decisions and demonstrates good insight into disease process. Alert and Oriented x 3. pleasant and cooperative. General Notes: [name] wound currently is showing signs of excellent improvement with regard to the wound in fact her leg appears to be completely healed which is great news. Integumentary (Hair, Skin) Wound #1 status is Open. Original cause of wound was Trauma. The date acquired was: 11/19/2021. The wound has been in treatment 5 weeks. The wound is located on the Right Lower Leg. The wound measures 0cm length x 0cm width x 0cm depth; 0cm^2 area and 0cm^3 volume. There is no tunneling or undermining noted. There is a none present amount of drainage noted. There is no granulation within the wound bed. There is no necrotic tissue within the wound bed. General Notes: scab removed, wound healed underneath Alvarez, Wetona C. ([id number]) Assessment Active Problems ICD-10 Venous insufficiency (chronic) (peripheral) Non-pressure chronic ulcer of other part of right lower leg with fat layer exposed Other ventricular tachycardia Plan Discharge From [facility] Services: Discharge from [place] Treatment Complete - lotion legs dailywith any of these lotion options, Eucerine, aveno or cetafill. Please call clinic with any issues to healed wound. Wear compression garments  daily. Put garments on first thing when you wake up and remove them before bed. Moisturize legs daily after removing compression garments. Elevate, Exercise Daily and Avoid Standing for Long Periods of Time. DO YOUR BEST to sleep in the bed at night. DO NOT sleep in your recliner. Long hours of sitting in a recliner leads to swelling of the legs and/or potential wounds on your backside. 1. I am good  recommend currently that we going continue with the wound care measures as before and the patient is in agreement with plan this includes the use of the compression which she does have compression socks now this is great news. 2. I am also can recommend that we have the patient continue with the lotion at bedtime that she should not put on any during the day just wearing a compression sock during the day. We will see her back for follow-up visit as needed. Electronic Signature(s) Signed: 02/05/2022 9:17:16 AM By: Worthy Keeler PA-C Entered By: Worthy Keeler on 02/05/2022 09:17:15 Haley Deleon ([id number]) -------------------------------------------------------------------------------- SuperBill Details Patient Name: Haley Deleon Date of Service: 02/05/2022 Medical Record Number: [id number] Patient Account Number: [id number] Date of Birth/Sex: 1956/06/24 (66 y.o. F) Treating RN: Levora Dredge Primary Care Provider: Denton Lank Other Clinician: Referring Provider: Denton Lank Treating Provider/Extender: Jeri Cos Weeks in Treatment: 5 Diagnosis Coding ICD-10 Codes Code Description I87.2 Venous insufficiency (chronic) (peripheral) L97.812 Non-pressure chronic ulcer of other part of right lower leg with fat layer exposed I47.29 Other ventricular tachycardia Facility Procedures CPT4 Code: [id number] Description: [phone] - WOUND CARE VISIT-LEV 2 EST PT Modifier: Quantity: 1 Physician Procedures CPT4 Code: 3762831 Description: 51761 - WC PHYS LEVEL 3 - EST  PT Modifier: Quantity: 1 CPT4 Code: Description: ICD-10 Diagnosis Description I87.2 Venous insufficiency (chronic) (peripheral) L97.812 Non-pressure chronic ulcer of other part of right lower leg with fat la I47.29 Other ventricular tachycardia Modifier: yer exposed Quantity: Electronic Signature(s) Signed: 02/05/2022 9:17:31 AM By: Worthy Keeler PA-C Entered By: Worthy Keeler on 02/05/2022 09:17:31

## 2022-02-24 ENCOUNTER — Inpatient Hospital Stay: Admission: RE | Admit: 2022-02-24 | Payer: Federal, State, Local not specified - PPO | Source: Ambulatory Visit

## 2022-03-05 ENCOUNTER — Emergency Department: Payer: Federal, State, Local not specified - PPO

## 2022-03-05 ENCOUNTER — Emergency Department
Admission: EM | Admit: 2022-03-05 | Discharge: 2022-03-05 | Disposition: A | Discharge status: Home / Self Care | Payer: Federal, State, Local not specified - PPO | Attending: Emergency Medicine | Admitting: Emergency Medicine

## 2022-03-05 ENCOUNTER — Other Ambulatory Visit: Payer: Self-pay

## 2022-03-05 DIAGNOSIS — X58XXXA Exposure to other specified factors, initial encounter: Secondary | ICD-10-CM | POA: Insufficient documentation

## 2022-03-05 DIAGNOSIS — J181 Lobar pneumonia, unspecified organism: Secondary | ICD-10-CM | POA: Diagnosis not present

## 2022-03-05 DIAGNOSIS — J90 Pleural effusion, not elsewhere classified: Secondary | ICD-10-CM | POA: Diagnosis not present

## 2022-03-05 DIAGNOSIS — J189 Pneumonia, unspecified organism: Secondary | ICD-10-CM

## 2022-03-05 DIAGNOSIS — S299XXA Unspecified injury of thorax, initial encounter: Secondary | ICD-10-CM | POA: Diagnosis present

## 2022-03-05 DIAGNOSIS — R0602 Shortness of breath: Secondary | ICD-10-CM | POA: Insufficient documentation

## 2022-03-05 DIAGNOSIS — S2242XA Multiple fractures of ribs, left side, initial encounter for closed fracture: Secondary | ICD-10-CM

## 2022-03-05 LAB — COMPREHENSIVE METABOLIC PANEL
ALT: 17 U/L (ref 0–44)
AST: 25 U/L (ref 15–41)
Albumin: 3.4 g/dL — ABNORMAL LOW (ref 3.5–5.0)
Alkaline Phosphatase: 102 U/L (ref 38–126)
Anion gap: 11 (ref 5–15)
BUN: 16 mg/dL (ref 8–23)
CO2: 27 mmol/L (ref 22–32)
Calcium: 8.9 mg/dL (ref 8.9–10.3)
Chloride: 103 mmol/L (ref 98–111)
Creatinine, Ser: 0.95 mg/dL (ref 0.44–1.00)
GFR, Estimated: >60 mL/min (ref >60)
Glucose, Bld: 205 mg/dL — ABNORMAL HIGH (ref 70–99)
Potassium: 4.5 mmol/L (ref 3.5–5.1)
Sodium: 141 mmol/L (ref 135–145)
Total Bilirubin: 0.9 mg/dL (ref 0.3–1.2)
Total Protein: 7.2 g/dL (ref 6.5–8.1)

## 2022-03-05 LAB — CBC WITH DIFFERENTIAL/PLATELET
Abs Immature Granulocytes: 0.05 10*3/uL (ref 0.00–0.07)
Basophils Absolute: 0.1 10*3/uL (ref 0.0–0.1)
Basophils Relative: 1 %
Eosinophils Absolute: 0.3 10*3/uL (ref 0.0–0.5)
Eosinophils Relative: 2 %
HCT: 43.9 % (ref 36.0–46.0)
Hemoglobin: 13.7 g/dL (ref 12.0–15.0)
Immature Granulocytes: 0 %
Lymphocytes Relative: 13 %
Lymphs Abs: 1.5 10*3/uL (ref 0.7–4.0)
MCH: 27.6 pg (ref 26.0–34.0)
MCHC: 31.2 g/dL (ref 30.0–36.0)
MCV: 88.3 fL (ref 80.0–100.0)
Monocytes Absolute: 0.9 10*3/uL (ref 0.1–1.0)
Monocytes Relative: 8 %
Neutro Abs: 8.8 10*3/uL — ABNORMAL HIGH (ref 1.7–7.7)
Neutrophils Relative %: 76 %
Platelets: 280 10*3/uL (ref 150–400)
RBC: 4.97 MIL/uL (ref 3.87–5.11)
RDW: 15.9 % — ABNORMAL HIGH (ref 11.5–15.5)
WBC: 11.6 10*3/uL — ABNORMAL HIGH (ref 4.0–10.5)
nRBC: 0 % (ref 0.0–0.2)

## 2022-03-05 LAB — TROPONIN I (HIGH SENSITIVITY): Troponin I (High Sensitivity): 4 ng/L

## 2022-03-05 LAB — BRAIN NATRIURETIC PEPTIDE: B Natriuretic Peptide: 31.6 pg/mL (ref 0.0–100.0)

## 2022-03-05 MED ORDER — HYDROCODONE BIT-HOMATROP MBR 5-1.5 MG/5ML PO SOLN: 5.0000 mL | Freq: Four times a day (QID) | ORAL | 0 refills | Status: DC | PRN

## 2022-03-05 MED ORDER — IOHEXOL 350 MG/ML SOLN
100.0000 mL | Freq: Once | INTRAVENOUS | Status: AC | PRN
  Administered 2022-03-05: 100 mL via INTRAVENOUS

## 2022-03-05 MED ORDER — BENZONATATE 100 MG PO CAPS
100.0000 mg | ORAL_CAPSULE | Freq: Three times a day (TID) | ORAL | 0 refills | Status: DC | PRN
Start: 2022-03-05 — End: 2022-09-04

## 2022-03-05 MED ORDER — LEVOFLOXACIN 500 MG PO TABS: 500.0000 mg | ORAL_TABLET | Freq: Every day | ORAL | 0 refills | Status: AC

## 2022-03-05 MED ORDER — SODIUM CHLORIDE 0.9 % IV SOLN
1.0000 g | Freq: Once | INTRAVENOUS | Status: AC
  Administered 2022-03-05: 1 g via INTRAVENOUS
  Filled 2022-03-05: qty 10

## 2022-03-05 MED ORDER — PREDNISONE 10 MG PO TABS: 10.0000 mg | ORAL_TABLET | ORAL | 0 refills | Status: DC

## 2022-03-05 NOTE — Discharge Instructions (Addendum)
You have been diagnosed with 3 rib fractures with likely associated pneumonia.  Given your original x-ray we were concerned that you could have congestive heart failure, pulmonary effusion, pulmonary edema, bronchitis, pneumonia.  We did your labs, as well as a CT scan to further evaluate the area and found your 3 rib fractures and findings concerning for pneumonia.  Your lungs are actually better appearing on CT than they were on x-ray.  Your white blood cell count which is typically elevated with infection is mildly elevated but the remainder of your labs look well.  For treatment, take the antibiotic to its completion, you may take the liquid and pill cough medicine to help with your cough.  If you have not completed your antibiotics and still feel like you have cough you may then take the steroid prescribed for you.  Follow-up with your primary care in 1 month to have repeat x-ray to ensure pneumonia has resolved.  Your ribs could take 6 weeks to fully heal.  If you are having worsening shortness of breath, fevers, difficulty breathing please come back to the emergency department.

## 2022-03-05 NOTE — ED Triage Notes (Signed)
First nurse note- Patient reports thoracic back pain for several months, reports that she has been coughing but now is unable to cough due to the pain. Patient believes she may have broken a rib.

## 2022-03-05 NOTE — ED Notes (Signed)
Pt stated she was lifting a suitcase with her left arm and the suitcase was heavy she believes she might have twisted her arm from the motion.

## 2022-03-05 NOTE — ED Provider Notes (Signed)
[facility] Provider Note  Patient Contact: 4:44 PM (approximate)   History   Coughing   HPI  Haley Deleon is a 66 y.o. female who presents to the emergency department complaining of some ongoing shortness of breath.  Patient states that she has had a cough for 3 to 4 months.  She was seen once at urgent care, diagnosed with viral URI and placed on prednisone.  She states that the prednisone seemed to help her but after the prednisone was gone the cough is returned.  She presents today because she is feeling a sharp pain with cough or deep breath in the left lower lung.  Patient feels short of breath at this time.  No substernal chest pain.  She denied any significant peripheral edema.  She has had an intermittent productive cough with clear/white sputum.  No fevers or chills, nasal congestion, sore throat.  No history of chronic lung problems.     Physical Exam   Triage Vital Signs: ED Triage Vitals  Enc Vitals Group     BP 03/05/22 1534 (!) 144/76     Pulse Rate 03/05/22 1534 (!) 106     Resp 03/05/22 1534 (!) 22     Temp 03/05/22 1534 98.8 F (37.1 C)     Temp Source 03/05/22 1534 Oral     SpO2 03/05/22 1534 93 %     Weight 03/05/22 1536 260 lb (117.9 kg)     Height 03/05/22 1536 5' 7"  (1.702 m)     Head Circumference --      Peak Flow --      Pain Score 03/05/22 1536 10     Pain Loc --      Pain Edu? --      Excl. in [place]? --     Most recent vital signs: Vitals:   03/05/22 1534 03/05/22 1905  BP: (!) 144/76 (!) 146/78  Pulse: (!) 106 (!) 103  Resp: (!) 22 18  Temp: 98.8 F (37.1 C)   SpO2: 93% 93%     General: Alert and in no acute distress.  Neck: No stridor. No cervical spine tenderness to palpation. Hematological/Lymphatic/Immunilogical: No cervical lymphadenopathy. Cardiovascular:  Good peripheral perfusion.  Normal S1 and S2.  No murmurs, rubs, gallops. Respiratory: Normal respiratory effort without tachypnea or retractions.  Lungs CTAB. Good air entry to the bases with no decreased or absent breath sounds Musculoskeletal: Full range of motion to all extremities.  Neurologic:  No gross focal neurologic deficits are appreciated.  Skin:   No rash noted Other:   ED Results / Procedures / Treatments   Labs (all labs ordered are listed, but only abnormal results are displayed) Labs Reviewed  COMPREHENSIVE METABOLIC PANEL - Abnormal; Notable for the following components:      Result Value   Glucose, Bld 205 (*)    Albumin 3.4 (*)    All other components within normal limits  CBC WITH DIFFERENTIAL/PLATELET - Abnormal; Notable for the following components:   WBC 11.6 (*)    RDW 15.9 (*)    Neutro Abs 8.8 (*)    All other components within normal limits  BRAIN NATRIURETIC PEPTIDE  TROPONIN I (HIGH SENSITIVITY)     EKG  ED ECG REPORT I, Charline Bills Dilon Lank,  personally viewed and interpreted this ECG.   Date: 03/05/2022  EKG Time: 1650 hrs.  Rate: 94 bpm  Rhythm: unchanged from previous tracings, normal sinus rhythm  Axis: Normal axis  Intervals:none  ST&T Change: No ST elevation or depression noted  Normal sinus rhythm.  No significant change from previous EKG from 07/16/2021    RADIOLOGY  I personally viewed, evaluated, and interpreted these images as part of my medical decision making, as well as reviewing the written report by the radiologist.  ED Provider Interpretation: Chest x-ray with what appears to be bilateral pleural effusions with interstitial markings concerning for pulmonary edema.  CT scan reveals no evidence of PE, significant p pulmonary edema.  Patient does have 3 nondisplaced rib fractures to the left eighth, ninth, 10th ribs.  Small effusion concerning for pneumonia  CT Angio Chest PE W and/or Wo Contrast  Result Date: 03/05/2022 CLINICAL DATA:  Pulmonary embolism (PE) suspected, high prob Pleuritic chest pain, pleural effusion on chest xray, rib pain EXAM: CT ANGIOGRAPHY  CHEST WITH CONTRAST TECHNIQUE: Multidetector CT imaging of the chest was performed using the standard protocol during bolus administration of intravenous contrast. Multiplanar CT image reconstructions and MIPs were obtained to evaluate the vascular anatomy. RADIATION DOSE REDUCTION: This exam was performed according to the departmental dose-optimization program which includes automated exposure control, adjustment of the mA and/or kV according to patient size and/or use of iterative reconstruction technique. CONTRAST:  163m OMNIPAQUE IOHEXOL 350 MG/ML SOLN COMPARISON:  08/07/2019 FINDINGS: Cardiovascular: Heart size normal. No pericardial effusion. The RV is nondilated. Satisfactory opacification of pulmonary arteries noted, and there is no evidence of pulmonary emboli. Adequate contrast opacification of the thoracic aorta with no evidence of dissection, aneurysm, or stenosis. There is classic 3-vessel brachiocephalic arch anatomy without proximal stenosis. Mediastinum/Nodes: No mediastinal hematoma, mass or adenopathy. Lungs/Pleura: Small left pleural effusion. No pneumothorax. Minimal dependent atelectasis at the left lung base. Right lung clear. Upper Abdomen: Marked right hydronephrosis, incompletely visualized. 2.6 cm cyst in the inferior right hepatic lobe, as described on prior ultrasound 07/15/2017. Cholecystectomy clips. Small right ventral hernia involving a loop of small bowel, incompletely evaluated. No suggestion of obstruction or strangulation on the visualized images. Musculoskeletal: Minimally displaced fracture of the lateral aspect left eighth, ninth, and tenth ribs. Review of the MIP images confirms the above findings. IMPRESSION: 1. Negative for acute PE or thoracic aortic dissection. 2. Left eighth, ninth, and tenth rib fractures with small pleural effusion, no pneumothorax. 3. Marked right hydronephrosis, as was described on prior ultrasound 07/15/2017. 4. Right ventral hernia involving a loop  of small bowel without evidence of obstruction or strangulation, limited evaluation. Electronically Signed   By: DLucrezia EuropeM.D.   On: 03/05/2022 18:22   DG Chest 2 View  Result Date: 03/05/2022 CLINICAL DATA:  Left anterior and posterior chest and rib pain. No known injury. Coughing a lot recently. EXAM: CHEST - 2 VIEW COMPARISON:  Chest two views 08/07/2019 and 09/17/2017; CT chest 08/07/2019 FINDINGS: Cardiac silhouette is again mildly enlarged. Mediastinal contours are within normal limits. Increased mild-to-moderate interstitial thickening. Small bilateral pleural effusions are new from prior. No pneumothorax. Minimal dextrocurvature of the midthoracic spine with mild-to-moderate multilevel degenerative disc changes of the midthoracic spine. There appears to be mild lateral left pleural thickening related to the pleural effusion. This obscures some of the lateral left ribs. If there is clinical concern for an acute lateral left mid to lower rib fracture, consider dedicated rib radiographs. IMPRESSION: 1. Increased mild-to-moderate interstitial thickening and new small bilateral pleural effusions. Stable cardiomegaly. Findings suggest mild interstitial pulmonary edema. 2. There appears to be mild lateral left pleural thickening related to the pleural effusion. This obscures some  of the lateral left ribs. If there is clinical concern for an acute lateral left mid to lower rib fracture, consider dedicated rib radiographs. Electronically Signed   By: Yvonne Kendall M.D.   On: 03/05/2022 15:57    PROCEDURES:  Critical Care performed: No  Procedures   MEDICATIONS ORDERED IN ED: Medications  iohexol (OMNIPAQUE) 350 MG/ML injection 100 mL (100 mLs Intravenous Contrast Given 03/05/22 1756)  cefTRIAXone (ROCEPHIN) 1 g in sodium chloride 0.9 % 100 mL IVPB (0 g Intravenous Stopped 03/05/22 1931)     IMPRESSION / MDM / ASSESSMENT AND PLAN / ED COURSE  I reviewed the triage vital signs and the nursing  notes.                              Differential diagnosis includes, but is not limited to, pulmonary edema, STEMI, NSTEMI, rib fracture, PE, pneumothorax   [name] presentation is most consistent with acute presentation with potential threat to life or bodily function.   [name] diagnosis is consistent with pneumonia, rib fractures.  Patient presents to the ED with cough x3 months, with worsening of cough, shortness of breath and left lung/rib pain.  Initial work-up revealed chest x-ray was concerning for pulmonary edema.  CT scan was ordered with no evidence of PE, pneumo or significant pulmonary edema.  There is a pleural effusion adjacent to 3 nondisplaced rib fractures.  Rib fractures appear to be sustained from her significant coughing.  We will treat for pneumonia at this time.  Offered/recommended admission but patient states that she does not want to stay in the hospital.  With reassuring vitals, labs I do feel that patient would be able to be managed at home with medications.  Rocephin administered here, patient will be placed on Levaquin at home.  We will treat with cough medications for the patient as well.  Follow-up primary care as needed..Patient is given ED precautions to return to the ED for any worsening or new symptoms.        FINAL CLINICAL IMPRESSION(S) / ED DIAGNOSES   Final diagnoses:  Closed fracture of multiple ribs of left side, initial encounter  Community acquired pneumonia of left lower lobe of lung     Rx / DC Orders   ED Discharge Orders          Ordered    levofloxacin (LEVAQUIN) 500 MG tablet  Daily        03/05/22 1906    benzonatate (TESSALON PERLES) 100 MG capsule  3 times daily PRN        03/05/22 1906    HYDROcodone bit-homatropine (HYCODAN) 5-1.5 MG/5ML syrup  Every 6 hours PRN        03/05/22 1906    predniSONE (DELTASONE) 10 MG tablet  As directed       Note to Pharmacy: Take on a pattern of 6, 6, 5, 5, 4, 4, 3, 3, 2, 2, 1, 1    03/05/22 1906             Note:  This document was prepared using Dragon voice recognition software and may include unintentional dictation errors.   Brynda Peon 03/06/22 0006    Lavonia Drafts, MD 03/06/22 [phone]

## 2022-03-05 NOTE — ED Triage Notes (Signed)
Pt to ED for intractable cough for last few months, worse today, pt believes she may have cracked a rib on L side. 10/10 sharp stabbing pain in L rib area especially when coughs, also exascerbated by movement and twisting.   Takes SSRI for, states hx anxiety since 2 years ago after her daughter's death, unsure if she can take pain meds due to this, states prednisone may help her more because it worked in past.

## 2022-03-17 ENCOUNTER — Ambulatory Visit: Payer: Federal, State, Local not specified - PPO | Admitting: Nurse Practitioner

## 2022-04-01 ENCOUNTER — Telehealth: Payer: Self-pay | Admitting: Internal Medicine

## 2022-04-01 NOTE — Telephone Encounter (Signed)
Made 3 attempts to schedule removing recall.

## 2022-04-09 ENCOUNTER — Ambulatory Visit
Admission: RE | Admit: 2022-04-09 | Discharge: 2022-04-09 | Disposition: A | Discharge status: Home / Self Care | Payer: Federal, State, Local not specified - PPO | Source: Ambulatory Visit | Attending: Family Medicine | Admitting: Family Medicine

## 2022-04-09 ENCOUNTER — Ambulatory Visit
Admission: RE | Admit: 2022-04-09 | Discharge: 2022-04-09 | Disposition: A | Discharge status: Home / Self Care | Payer: Federal, State, Local not specified - PPO | Attending: Family Medicine | Admitting: Family Medicine

## 2022-04-09 ENCOUNTER — Other Ambulatory Visit: Payer: Self-pay | Admitting: Family Medicine

## 2022-04-09 DIAGNOSIS — Z8701 Personal history of pneumonia (recurrent): Secondary | ICD-10-CM

## 2022-06-08 IMAGING — MG MM DIGITAL SCREENING BILAT W/ TOMO AND CAD
8 of 15 series · 8 of 40 positions shown · non-contrast
Comparison: Previous exam(s).

CLINICAL DATA: Screening.

EXAM:
DIGITAL SCREENING BILATERAL MAMMOGRAM WITH TOMO AND CAD

[R MLO synth-2D (1 of 2)]
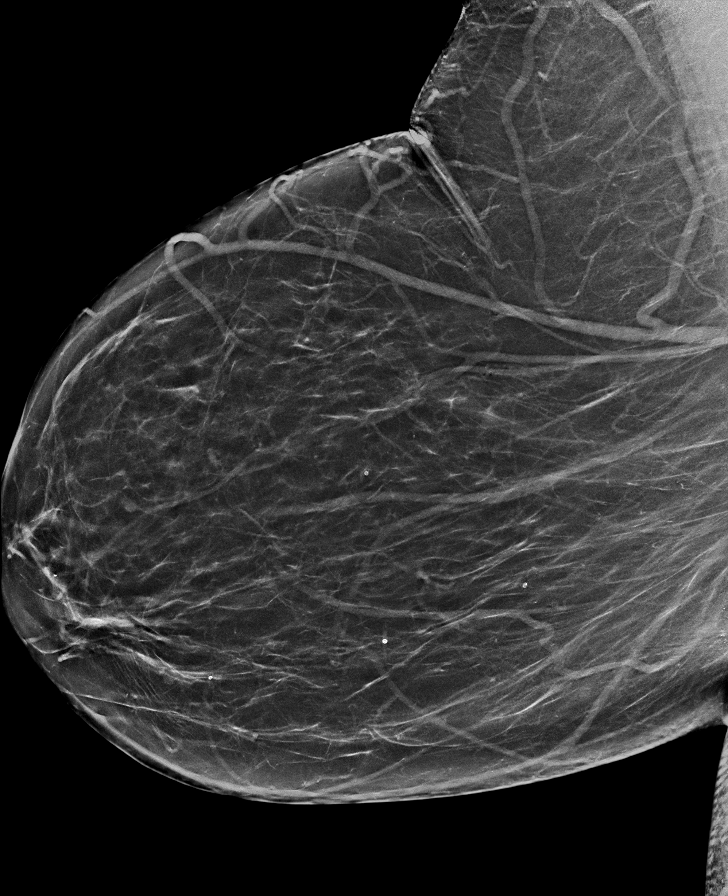

[R MLO synth-2D (2 of 2)]
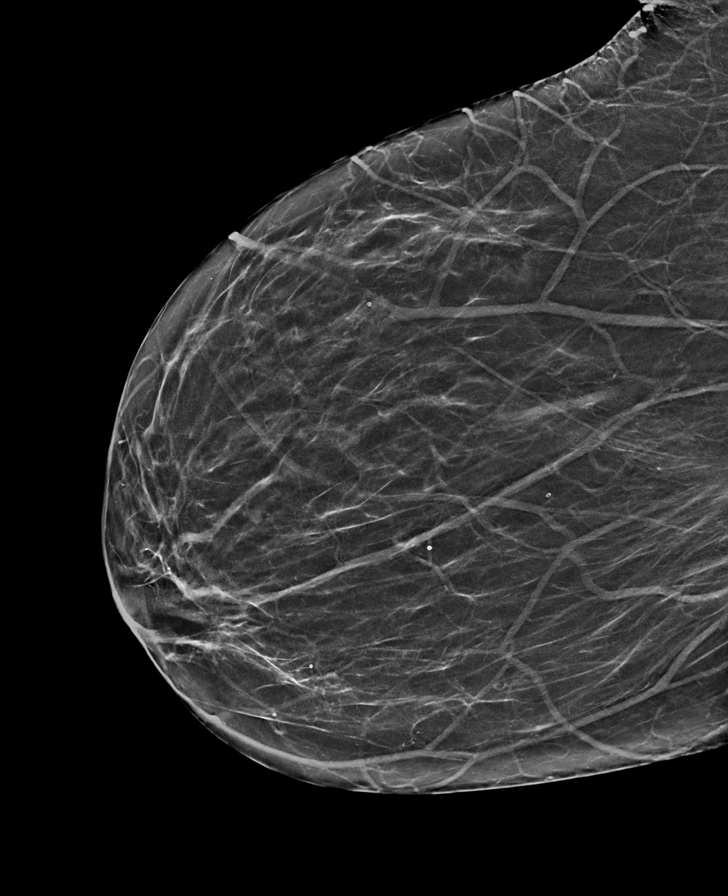

[L MLO synth-2D (1 of 3)]
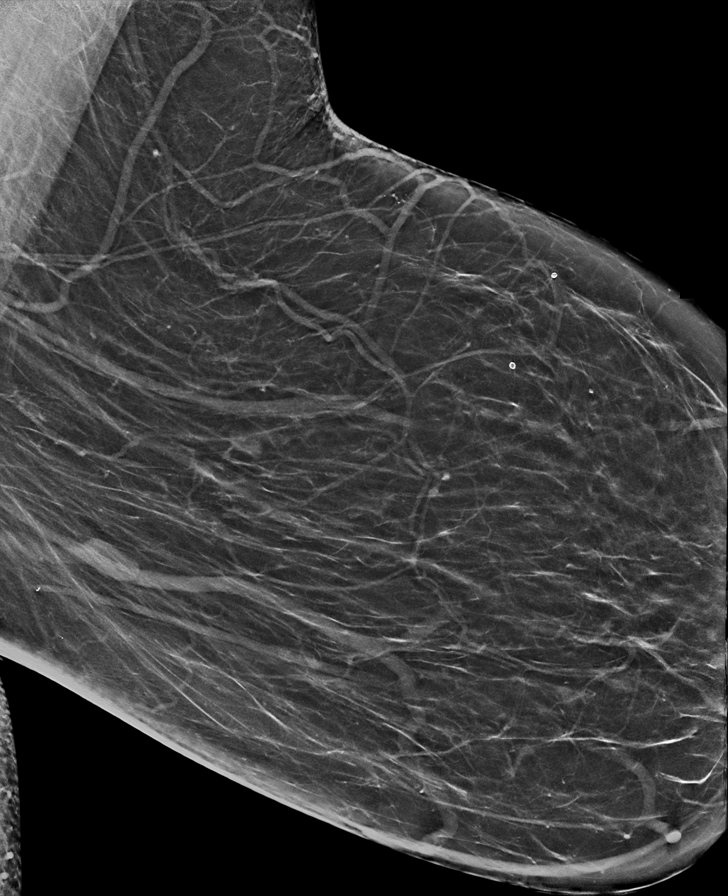

[L CC synth-2D]
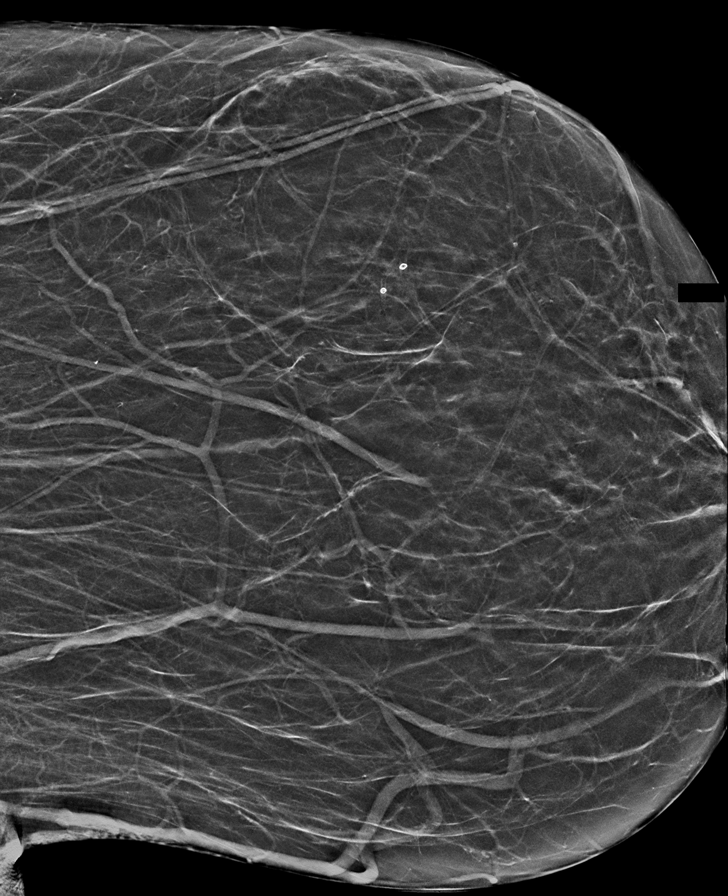

[L MLO synth-2D (2 of 3)]
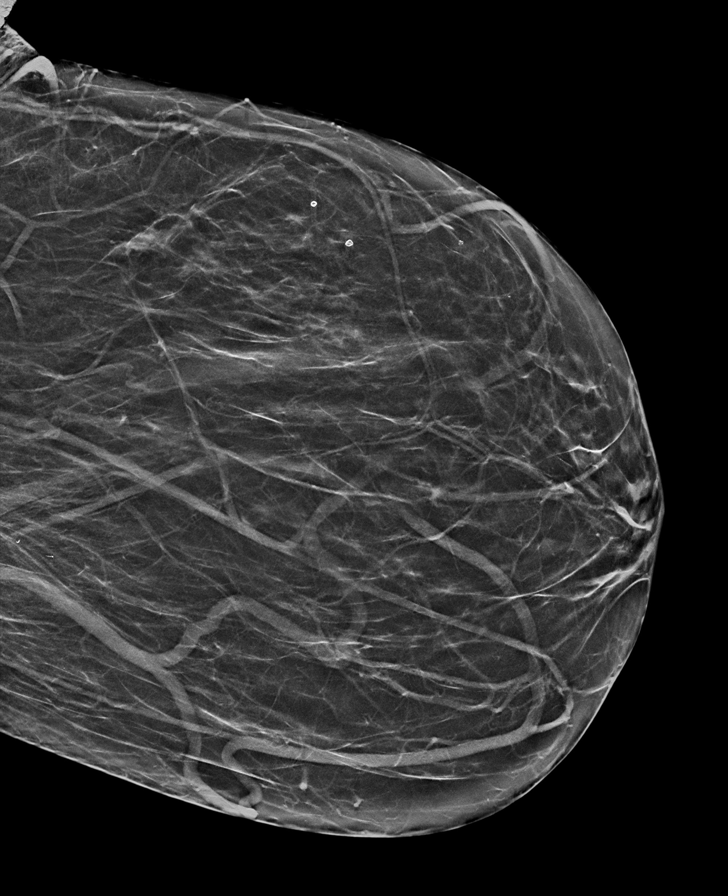

[R CC synth-2D]
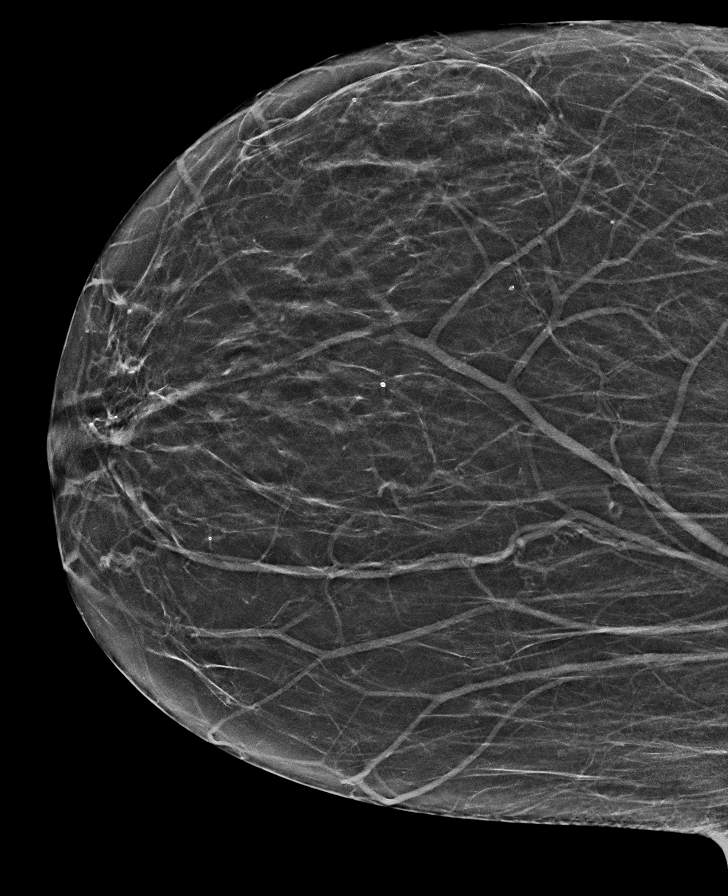

[L MLO synth-2D (3 of 3)]
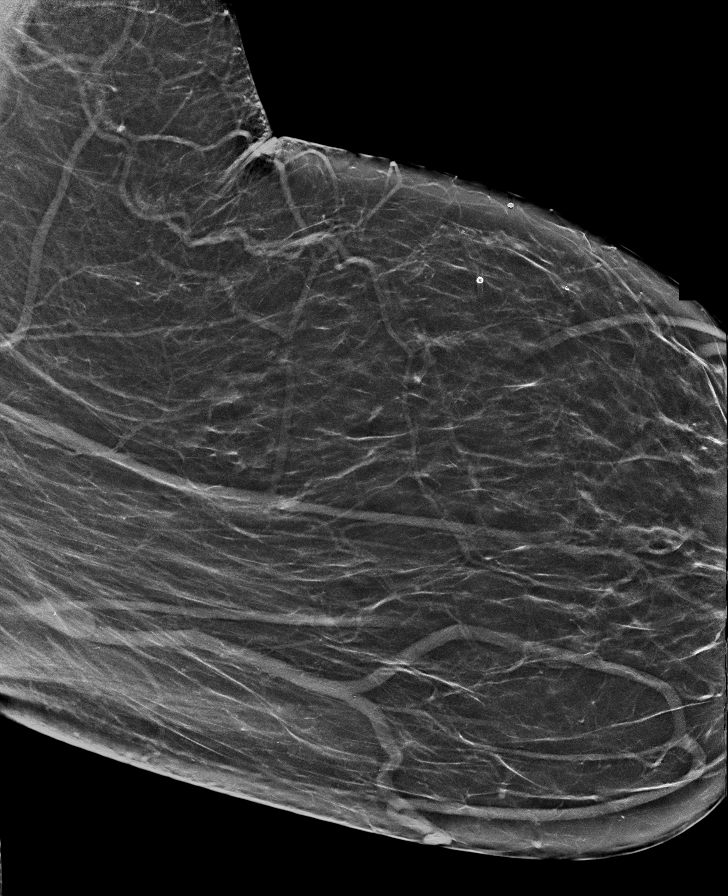

[R MLO tomo · tomo slice 49/71.0]
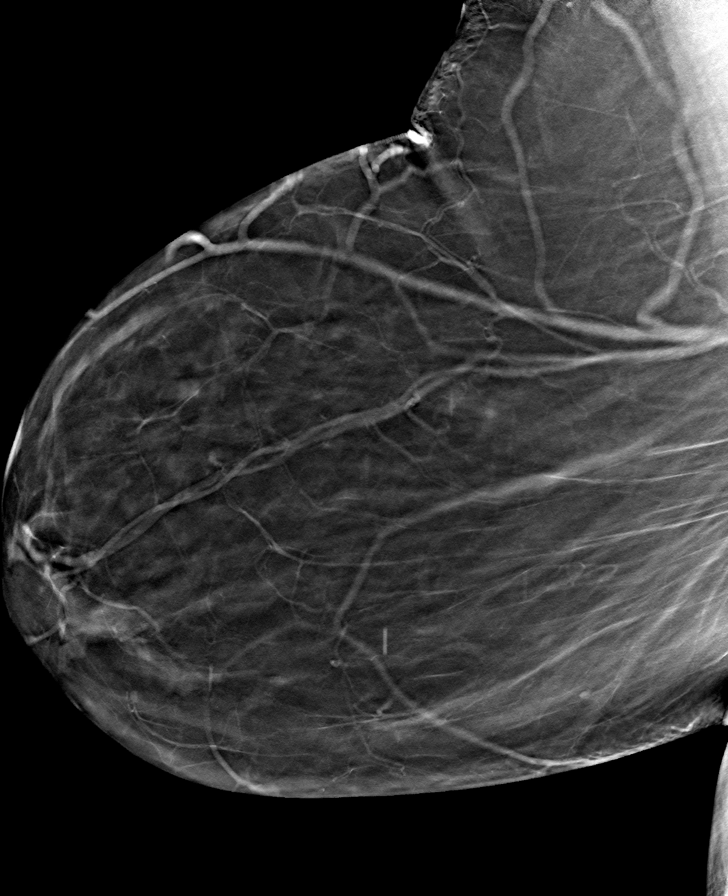

[8 of 40 positions shown; findings below may reference images not displayed]

ACR Breast Density Category b: There are scattered areas of
fibroglandular density.
FINDINGS: There are no findings suspicious for malignancy. The images were
evaluated with computer-aided detection.
IMPRESSION: No mammographic evidence of malignancy. A result letter of this
screening mammogram will be mailed directly to the patient.

RECOMMENDATION:
Screening mammogram in one year. (Code:ZP-7-VX7)

BI-RADS CATEGORY  1: Negative.

## 2022-07-20 ENCOUNTER — Encounter (INDEPENDENT_AMBULATORY_CARE_PROVIDER_SITE_OTHER): Payer: Self-pay

## 2022-08-27 ENCOUNTER — Other Ambulatory Visit: Payer: Self-pay | Admitting: Obstetrics and Gynecology

## 2022-08-31 NOTE — H&P (Signed)
Haley Deleon is a 66 y.o. female here for fractional d+c , H/S .   Pt here for follow up for PMB and thickened stripe   Difficult exam      Past Medical History:  has a past medical history of Anxiety, H/O mammogram (10/16/2013), and Ulcerative colitis (CMS-HCC).  Past Surgical History:  has a past surgical history that includes egd (10/08/2011); Colonoscopy (11/21/2010); Colonoscopy; Removal Ectopic Pregnancy W/Salpingo-Ophorectomy Abdominal (Left); jaw surgery; and LAPAROSCOPY,SURG; COLECT,TOT,ABD,W/PROCTECT,ILEOAN ANAST,CREATE ILEAL RESERV, LOOP ILEOST,INCL RCTL MUCOSEC. Family History: family history includes Cervical cancer in her mother. Social History:  reports that she has never smoked. She has never used smokeless tobacco. She reports that she does not drink alcohol and does not use drugs. OB/GYN History:  OB History       Gravida  5   Para      Term      Preterm      AB  5   Living           SAB  5   IAB      Ectopic      Molar      Multiple      Live Births                Allergies: is allergic to caffeine, cocoa, and meloxicam. Medications:   Current Outpatient Medications:    escitalopram oxalate (LEXAPRO) 10 MG tablet, TAKE 1 TABLET BY MOUTH ONCE DAILY FOR ANXIETY, Disp: , Rfl:    hydrOXYzine HCL (ATARAX) 10 MG tablet, TAKE 1 TABLET BY MOUTH THREE TIMES DAILY AS NEEDED FOR ANXIETY AND PANIC ATTACKS. WATCH FOR SEDATION., Disp: , Rfl:    loperamide (IMODIUM A-D) 2 mg tablet, Take 2 mg by mouth 3 (three) times daily as needed, Disp: , Rfl:    Compound Medication, Estriol 1 mg/g 1/4 app per vagina twice weekly Disp 30 g tube with 2 rf  called to [facility] (Patient not taking: Reported on 10/09/2020), Disp: 1 each, Rfl: 2   ibuprofen (ADVIL,MOTRIN) 100 mg/5 mL suspension, Take by mouth every 6 (six) hours as needed for Fever. (Patient not taking: Reported on 10/09/2020), Disp: , Rfl:    multivitamin tablet, Take 1 tablet by mouth once daily. (Patient not  taking: Reported on 10/09/2020), Disp: , Rfl:    Review of Systems: General:                      No fatigue or weight loss Eyes:                           No vision changes Ears:                            No hearing difficulty Respiratory:                No cough or shortness of breath Pulmonary:                  No asthma or shortness of breath Cardiovascular:           No chest pain, palpitations, dyspnea on exertion Gastrointestinal:          No abdominal bloating, chronic diarrhea, constipations, masses, pain or hematochezia Genitourinary:             No hematuria, dysuria, abnormal vaginal discharge, pelvic pain, Menometrorrhagia Lymphatic:  No swollen lymph nodes Musculoskeletal:         No muscle weakness Neurologic:                  No extremity weakness, syncope, seizure disorder Psychiatric:                  No history of depression, delusions or suicidal/homicidal ideation      Exam:       Vitals:    08/20/22 1442  BP: (!) 146/99  Pulse: (!) 118      Body mass index is 46.83 kg/m.   WDWN / black female in NAD   Lungs: CTA  CV : RRR without murmur   Breast: exam done in sitting and lying position : No dimpling or retraction, no dominant mass, no spontaneous discharge, no axillary adenopathy Neck:  no thyromegaly Abdomen: soft , no mass, normal active bowel sounds,  non-tender, no rebound tenderness Pelvic: tanner stage 5 ,  External genitalia: vulva /labia no lesions Urethra: no prolapse Vagina: normal physiologic d/c Inability to see the cervix . Unable to perform an EMBX    U/s today : Uterus retroverted   Endometrium=14.41m   Rt ovary appears wnl Left ovary removed No free fluid seen   Impression:    The primary encounter diagnosis was PMB (postmenopausal bleeding). Diagnoses of Thickened endometrium and Morbid obesity with BMI of 45.0-49.9, adult (CMS-HCC) were also pertinent to this visit.   R/o EIN / CA    Plan:    Unable to  perform an EMBX  Recommend Fx D+C and hysteroscopy in OR

## 2022-09-04 ENCOUNTER — Other Ambulatory Visit: Payer: Self-pay

## 2022-09-04 ENCOUNTER — Encounter
Admission: RE | Admit: 2022-09-04 | Discharge: 2022-09-04 | Disposition: A | Discharge status: Home / Self Care | Payer: Federal, State, Local not specified - PPO | Source: Ambulatory Visit | Attending: Obstetrics and Gynecology | Admitting: Obstetrics and Gynecology

## 2022-09-04 DIAGNOSIS — Z01812 Encounter for preprocedural laboratory examination: Secondary | ICD-10-CM

## 2022-09-04 HISTORY — DX: Pneumonia, unspecified organism: J18.9

## 2022-09-04 HISTORY — DX: Atherosclerotic heart disease of native coronary artery without angina pectoris: I25.10

## 2022-09-04 HISTORY — DX: Cardiac murmur, unspecified: R01.1

## 2022-09-04 HISTORY — DX: Cardiac arrhythmia, unspecified: I49.9

## 2022-09-04 NOTE — Pre-Procedure Instructions (Signed)
MULTIPLE ATTEMPTS TO REACH PATIENT FOR HER SCHEDULED PAT PHONE CALL, MESSAGE LEFT WITH NO RETURN CALL , PLACED A CALL TO HER HUSBAND AND HE VOICED TO CALL HER AT 4:30 PM. I WILL TRY AND REACH HER AT THAT TIME.

## 2022-09-04 NOTE — Patient Instructions (Addendum)
Your procedure is scheduled on: 09/11/22 - Friday Report to the Registration Desk on the 1st floor of the [facility]. To find out your arrival time, please call [phone] between 1PM - 3PM on: 09/10/22 - Thursday If your arrival time is 6:00 am, do not arrive prior to that time as the [facility] entrance doors do not open until 6:00 am.  REMEMBER: Instructions that are not followed completely may result in serious medical risk, up to and including death; or upon the discretion of your surgeon and anesthesiologist your surgery may need to be rescheduled.  Do not eat food after midnight the night before surgery.  No gum chewing, lozengers or hard candies.  You may however, drink CLEAR liquids up to 2 hours before you are scheduled to arrive for your surgery. Do not drink anything within 2 hours of your scheduled arrival time.  Clear liquids include: - water  - apple juice without pulp - gatorade (not RED colors) - black coffee or tea (Do NOT add milk or creamers to the coffee or tea) Do NOT drink anything that is not on this list.  TAKE THESE MEDICATIONS THE MORNING OF SURGERY WITH A SIP OF WATER: - none  One week prior to surgery: Stop Anti-inflammatories (NSAIDS) such as Advil, Aleve, Ibuprofen, Motrin, Naproxen, Naprosyn and Aspirin based products such as Excedrin, Goodys Powder, BC Powder.  Stop ANY OVER THE COUNTER supplements until after surgery.  You may however, continue to take Tylenol if needed for pain up until the day of surgery.  No Alcohol for 24 hours before or after surgery.  No Smoking including e-cigarettes for 24 hours prior to surgery.  No chewable tobacco products for at least 6 hours prior to surgery.  No nicotine patches on the day of surgery.  Do not use any "recreational" drugs for at least a week prior to your surgery.  Please be advised that the combination of cocaine and anesthesia may have negative outcomes, up to and including death. If you  test positive for cocaine, your surgery will be cancelled.  On the morning of surgery brush your teeth with toothpaste and water, you may rinse your mouth with mouthwash if you wish. Do not swallow any toothpaste or mouthwash.  Do not wear jewelry, make-up, hairpins, clips or nail polish.  Do not wear lotions, powders, or perfumes.   Do not shave body from the neck down 48 hours prior to surgery just in case you cut yourself which could leave a site for infection. Also, freshly shaved skin may become irritated if using the CHG soap.  Contact lenses, hearing aids and dentures may not be worn into surgery.  Do not bring valuables to the hospital. [facility] is not responsible for any missing/lost belongings or valuables.   Notify your doctor if there is any change in your medical condition (cold, fever, infection).  Wear comfortable clothing (specific to your surgery type) to the hospital.  After surgery, you can help prevent lung complications by doing breathing exercises.  Take deep breaths and cough every 1-2 hours. Your doctor may order a device called an Incentive Spirometer to help you take deep breaths. When coughing or sneezing, hold a pillow firmly against your incision with both hands. This is called "splinting." Doing this helps protect your incision. It also decreases belly discomfort.  If you are being admitted to the hospital overnight, leave your suitcase in the car. After surgery it may be brought to your room.  If  you are being discharged the day of surgery, you will not be allowed to drive home. You will need a responsible adult (18 years or older) to drive you home and stay with you that night.   If you are taking public transportation, you will need to have a responsible adult (18 years or older) with you. Please confirm with your physician that it is acceptable to use public transportation.   Please call the [facility] Dept. at [phone] if you have  any questions about these instructions.  Surgery Visitation Policy:  Patients undergoing a surgery or procedure may have two family members or support persons with them as long as the person is not COVID-19 positive or experiencing its symptoms.   Inpatient Visitation:    Visiting hours are 7 a.m. to 8 p.m. Up to four visitors are allowed at one time in a patient room. The visitors may rotate out with other people during the day. One designated support person (adult) may remain overnight.  Due to an increase in RSV and influenza rates and associated hospitalizations, children ages 60 and under will not be able to visit patients in [facility]. Masks continue to be strongly recommended.

## 2022-09-07 ENCOUNTER — Encounter: Payer: Self-pay | Admitting: Obstetrics and Gynecology

## 2022-09-08 ENCOUNTER — Encounter
Admission: RE | Admit: 2022-09-08 | Discharge: 2022-09-08 | Disposition: A | Discharge status: Home / Self Care | Payer: Federal, State, Local not specified - PPO | Source: Ambulatory Visit | Attending: Obstetrics and Gynecology | Admitting: Obstetrics and Gynecology

## 2022-09-08 DIAGNOSIS — I2510 Atherosclerotic heart disease of native coronary artery without angina pectoris: Secondary | ICD-10-CM | POA: Diagnosis not present

## 2022-09-08 DIAGNOSIS — D649 Anemia, unspecified: Secondary | ICD-10-CM | POA: Diagnosis not present

## 2022-09-08 DIAGNOSIS — Z6841 Body Mass Index (BMI) 40.0 and over, adult: Secondary | ICD-10-CM | POA: Diagnosis not present

## 2022-09-08 DIAGNOSIS — Z01818 Encounter for other preprocedural examination: Secondary | ICD-10-CM | POA: Insufficient documentation

## 2022-09-08 DIAGNOSIS — F419 Anxiety disorder, unspecified: Secondary | ICD-10-CM | POA: Diagnosis not present

## 2022-09-08 DIAGNOSIS — R9389 Abnormal findings on diagnostic imaging of other specified body structures: Secondary | ICD-10-CM | POA: Diagnosis not present

## 2022-09-08 DIAGNOSIS — N950 Postmenopausal bleeding: Secondary | ICD-10-CM | POA: Diagnosis present

## 2022-09-08 DIAGNOSIS — D759 Disease of blood and blood-forming organs, unspecified: Secondary | ICD-10-CM | POA: Diagnosis not present

## 2022-09-08 DIAGNOSIS — Z01812 Encounter for preprocedural laboratory examination: Secondary | ICD-10-CM

## 2022-09-08 DIAGNOSIS — Z8711 Personal history of peptic ulcer disease: Secondary | ICD-10-CM | POA: Diagnosis not present

## 2022-09-08 LAB — BASIC METABOLIC PANEL
Anion gap: 10 (ref 5–15)
BUN: 13 mg/dL (ref 8–23)
CO2: 25 mmol/L (ref 22–32)
Calcium: 9.3 mg/dL (ref 8.9–10.3)
Chloride: 105 mmol/L (ref 98–111)
Creatinine, Ser: 0.81 mg/dL (ref 0.44–1.00)
GFR, Estimated: >60 mL/min (ref >60)
Glucose, Bld: 126 mg/dL — ABNORMAL HIGH (ref 70–99)
Potassium: 3.9 mmol/L (ref 3.5–5.1)
Sodium: 140 mmol/L (ref 135–145)

## 2022-09-08 LAB — CBC
HCT: 48.6 % — ABNORMAL HIGH (ref 36.0–46.0)
Hemoglobin: 15.2 g/dL — ABNORMAL HIGH (ref 12.0–15.0)
MCH: 27.5 pg (ref 26.0–34.0)
MCHC: 31.3 g/dL (ref 30.0–36.0)
MCV: 87.9 fL (ref 80.0–100.0)
Platelets: 255 10*3/uL (ref 150–400)
RBC: 5.53 MIL/uL — ABNORMAL HIGH (ref 3.87–5.11)
RDW: 16 % — ABNORMAL HIGH (ref 11.5–15.5)
WBC: 10.2 10*3/uL (ref 4.0–10.5)
nRBC: 0 % (ref 0.0–0.2)

## 2022-09-08 LAB — TYPE AND SCREEN
ABO/RH(D): O POS
Antibody Screen: NEGATIVE

## 2022-09-10 MED ORDER — CEFAZOLIN IN SODIUM CHLORIDE 3-0.9 GM/100ML-% IV SOLN
3.0000 g | Freq: Once | INTRAVENOUS | Status: AC
  Administered 2022-09-11: 3 g via INTRAVENOUS
  Filled 2022-09-10: qty 100

## 2022-09-10 MED ORDER — CHLORHEXIDINE GLUCONATE 0.12 % MT SOLN: 15.0000 mL | Freq: Once | OROMUCOSAL | Status: AC

## 2022-09-10 MED ORDER — LACTATED RINGERS IV SOLN: INTRAVENOUS | Status: DC

## 2022-09-10 MED ORDER — FAMOTIDINE 20 MG PO TABS: 20.0000 mg | ORAL_TABLET | Freq: Once | ORAL | Status: AC

## 2022-09-10 MED ORDER — ORAL CARE MOUTH RINSE: 15.0000 mL | Freq: Once | OROMUCOSAL | Status: AC

## 2022-09-10 MED ORDER — ACETAMINOPHEN 500 MG PO TABS: 1000.0000 mg | ORAL_TABLET | ORAL | Status: AC

## 2022-09-10 MED ORDER — POVIDONE-IODINE 10 % EX SWAB
2.0000 | Freq: Once | CUTANEOUS | Status: AC
  Administered 2022-09-11: 2 via TOPICAL

## 2022-09-10 MED ORDER — GABAPENTIN 300 MG PO CAPS: 300.0000 mg | ORAL_CAPSULE | ORAL | Status: AC

## 2022-09-11 ENCOUNTER — Encounter
Admission: RE | Disposition: A | Discharge status: Home / Self Care | Payer: Self-pay | Source: Home / Self Care | Attending: Obstetrics and Gynecology

## 2022-09-11 ENCOUNTER — Ambulatory Visit: Payer: Federal, State, Local not specified - PPO | Admitting: Urgent Care

## 2022-09-11 ENCOUNTER — Ambulatory Visit
Admission: RE | Admit: 2022-09-11 | Discharge: 2022-09-11 | Disposition: A | Discharge status: Home / Self Care | Payer: Federal, State, Local not specified - PPO | Attending: Obstetrics and Gynecology | Admitting: Obstetrics and Gynecology

## 2022-09-11 ENCOUNTER — Encounter: Payer: Self-pay | Admitting: Obstetrics and Gynecology

## 2022-09-11 ENCOUNTER — Other Ambulatory Visit: Payer: Self-pay

## 2022-09-11 DIAGNOSIS — F419 Anxiety disorder, unspecified: Secondary | ICD-10-CM | POA: Insufficient documentation

## 2022-09-11 DIAGNOSIS — N950 Postmenopausal bleeding: Secondary | ICD-10-CM | POA: Diagnosis not present

## 2022-09-11 DIAGNOSIS — Z01818 Encounter for other preprocedural examination: Secondary | ICD-10-CM

## 2022-09-11 DIAGNOSIS — Z6841 Body Mass Index (BMI) 40.0 and over, adult: Secondary | ICD-10-CM | POA: Insufficient documentation

## 2022-09-11 DIAGNOSIS — Z8711 Personal history of peptic ulcer disease: Secondary | ICD-10-CM | POA: Insufficient documentation

## 2022-09-11 DIAGNOSIS — R9389 Abnormal findings on diagnostic imaging of other specified body structures: Secondary | ICD-10-CM | POA: Insufficient documentation

## 2022-09-11 DIAGNOSIS — D649 Anemia, unspecified: Secondary | ICD-10-CM | POA: Insufficient documentation

## 2022-09-11 DIAGNOSIS — D759 Disease of blood and blood-forming organs, unspecified: Secondary | ICD-10-CM | POA: Insufficient documentation

## 2022-09-11 DIAGNOSIS — I2510 Atherosclerotic heart disease of native coronary artery without angina pectoris: Secondary | ICD-10-CM | POA: Insufficient documentation

## 2022-09-11 HISTORY — PX: HYSTEROSCOPY WITH D & C: SHX1775

## 2022-09-11 LAB — ABO/RH: ABO/RH(D): O POS

## 2022-09-11 SURGERY — DILATATION AND CURETTAGE /HYSTEROSCOPY
Anesthesia: General | Site: Uterus

## 2022-09-11 MED ORDER — ONDANSETRON HCL 4 MG/2ML IJ SOLN
INTRAMUSCULAR | Status: DC | PRN
  Administered 2022-09-11: 4 mg via INTRAVENOUS

## 2022-09-11 MED ORDER — PROPOFOL 10 MG/ML IV BOLUS
INTRAVENOUS | Status: DC | PRN
  Administered 2022-09-11: 150 mg via INTRAVENOUS

## 2022-09-11 MED ORDER — MIDAZOLAM HCL 2 MG/2ML IJ SOLN
INTRAMUSCULAR | Status: DC | PRN
  Administered 2022-09-11: 2 mg via INTRAVENOUS

## 2022-09-11 MED ORDER — SILVER NITRATE-POT NITRATE 75-25 % EX MISC
CUTANEOUS | Status: DC | PRN
  Administered 2022-09-11 (×2): 2

## 2022-09-11 MED ORDER — PHENYLEPHRINE HCL (PRESSORS) 10 MG/ML IV SOLN
INTRAVENOUS | Status: DC | PRN
  Administered 2022-09-11 (×2): 80 ug via INTRAVENOUS

## 2022-09-11 MED ORDER — SILVER NITRATE-POT NITRATE 75-25 % EX MISC
CUTANEOUS | Status: AC
  Filled 2022-09-11: qty 10

## 2022-09-11 MED ORDER — ACETAMINOPHEN 500 MG PO TABS
ORAL_TABLET | ORAL | Status: AC
  Administered 2022-09-11: 1000 mg via ORAL
  Filled 2022-09-11: qty 2

## 2022-09-11 MED ORDER — SUGAMMADEX SODIUM 200 MG/2ML IV SOLN
INTRAVENOUS | Status: DC | PRN
  Administered 2022-09-11: 200 mg via INTRAVENOUS

## 2022-09-11 MED ORDER — FENTANYL CITRATE (PF) 100 MCG/2ML IJ SOLN: 25.0000 ug | INTRAMUSCULAR | Status: DC | PRN

## 2022-09-11 MED ORDER — ONDANSETRON HCL 4 MG/2ML IJ SOLN: 4.0000 mg | Freq: Once | INTRAMUSCULAR | Status: DC | PRN

## 2022-09-11 MED ORDER — GABAPENTIN 300 MG PO CAPS
ORAL_CAPSULE | ORAL | Status: AC
  Administered 2022-09-11: 300 mg via ORAL
  Filled 2022-09-11: qty 1

## 2022-09-11 MED ORDER — FENTANYL CITRATE (PF) 100 MCG/2ML IJ SOLN
INTRAMUSCULAR | Status: AC
  Filled 2022-09-11: qty 2

## 2022-09-11 MED ORDER — ROCURONIUM BROMIDE 100 MG/10ML IV SOLN
INTRAVENOUS | Status: DC | PRN
  Administered 2022-09-11: 60 mg via INTRAVENOUS

## 2022-09-11 MED ORDER — MIDAZOLAM HCL 2 MG/2ML IJ SOLN
INTRAMUSCULAR | Status: AC
  Filled 2022-09-11: qty 2

## 2022-09-11 MED ORDER — FAMOTIDINE 20 MG PO TABS
ORAL_TABLET | ORAL | Status: AC
  Administered 2022-09-11: 20 mg via ORAL
  Filled 2022-09-11: qty 1

## 2022-09-11 MED ORDER — CHLORHEXIDINE GLUCONATE 0.12 % MT SOLN
OROMUCOSAL | Status: AC
  Administered 2022-09-11: 15 mL via OROMUCOSAL
  Filled 2022-09-11: qty 15

## 2022-09-11 MED ORDER — 0.9 % SODIUM CHLORIDE (POUR BTL) OPTIME
TOPICAL | Status: DC | PRN
  Administered 2022-09-11: 500 mL

## 2022-09-11 MED ORDER — PROPOFOL 1000 MG/100ML IV EMUL
INTRAVENOUS | Status: AC
  Filled 2022-09-11: qty 100

## 2022-09-11 MED ORDER — FENTANYL CITRATE (PF) 100 MCG/2ML IJ SOLN
INTRAMUSCULAR | Status: DC | PRN
  Administered 2022-09-11: 100 ug via INTRAVENOUS

## 2022-09-11 MED ORDER — DEXAMETHASONE SODIUM PHOSPHATE 10 MG/ML IJ SOLN
INTRAMUSCULAR | Status: DC | PRN
  Administered 2022-09-11: 10 mg via INTRAVENOUS

## 2022-09-11 MED ORDER — LIDOCAINE HCL (CARDIAC) PF 100 MG/5ML IV SOSY
PREFILLED_SYRINGE | INTRAVENOUS | Status: DC | PRN
  Administered 2022-09-11: 100 mg via INTRAVENOUS

## 2022-09-11 SURGICAL SUPPLY — 26 items
BAG PRESSURE INF REUSE 1000 (BAG) ×1 IMPLANT
DEVICE MYOSURE LITE (MISCELLANEOUS) IMPLANT
DEVICE MYOSURE REACH (MISCELLANEOUS) IMPLANT
DRSG TELFA 3X8 NADH STRL (GAUZE/BANDAGES/DRESSINGS) ×1 IMPLANT
GLOVE SURG SYN 8.0 (GLOVE) ×1 IMPLANT
GLOVE SURG SYN 8.0 PF PI (GLOVE) ×1 IMPLANT
GOWN STRL REUS W/ TWL LRG LVL3 (GOWN DISPOSABLE) ×1 IMPLANT
GOWN STRL REUS W/ TWL XL LVL3 (GOWN DISPOSABLE) ×1 IMPLANT
GOWN STRL REUS W/TWL LRG LVL3 (GOWN DISPOSABLE) ×1
GOWN STRL REUS W/TWL XL LVL3 (GOWN DISPOSABLE) ×1
IV NS 1000ML (IV SOLUTION) ×1
IV NS 1000ML BAXH (IV SOLUTION) ×1 IMPLANT
IV NS IRRIG 3000ML ARTHROMATIC (IV SOLUTION) ×1 IMPLANT
KIT PROCEDURE FLUENT (KITS) IMPLANT
KIT TURNOVER CYSTO (KITS) ×1 IMPLANT
MANIFOLD NEPTUNE II (INSTRUMENTS) ×1 IMPLANT
PACK DNC HYST (MISCELLANEOUS) IMPLANT
PAD OB MATERNITY 4.3X12.25 (PERSONAL CARE ITEMS) ×1 IMPLANT
PAD PREP 24X41 OB/GYN DISP (PERSONAL CARE ITEMS) ×1 IMPLANT
SCRUB CHG 4% DYNA-HEX 4OZ (MISCELLANEOUS) ×1 IMPLANT
SEAL ROD LENS SCOPE MYOSURE (ABLATOR) ×1 IMPLANT
SET CYSTO W/LG BORE CLAMP LF (SET/KITS/TRAYS/PACK) IMPLANT
TOWEL OR 17X26 4PK STRL BLUE (TOWEL DISPOSABLE) ×1 IMPLANT
TRAP FLUID SMOKE EVACUATOR (MISCELLANEOUS) ×1 IMPLANT
TUBING CONNECTING 10 (TUBING) IMPLANT
WATER STERILE IRR 500ML POUR (IV SOLUTION) ×1 IMPLANT

## 2022-09-11 NOTE — Anesthesia Procedure Notes (Signed)
Procedure Name: Intubation Date/Time: 09/11/2022 8:38 AM  Performed by: Biagio Borg, CRNAPre-anesthesia Checklist: Patient identified, Emergency Drugs available, Suction available and Patient being monitored Patient Re-evaluated:Patient Re-evaluated prior to induction Oxygen Delivery Method: Circle system utilized Preoxygenation: Pre-oxygenation with 100% oxygen Induction Type: IV induction Ventilation: Mask ventilation without difficulty Grade View: Grade II Tube type: Oral Tube size: 7.0 mm Number of attempts: 1 Airway Equipment and Method: Stylet and Oral airway Placement Confirmation: ETT inserted through vocal cords under direct vision, positive ETCO2 and breath sounds checked- equal and bilateral Secured at: 22 cm Tube secured with: Tape Dental Injury: Teeth and Oropharynx as per pre-operative assessment

## 2022-09-11 NOTE — Discharge Instructions (Addendum)
AMBULATORY SURGERY  ?DISCHARGE INSTRUCTIONS ? ? ?The drugs that you were given will stay in your system until tomorrow so for the next 24 hours you should not: ? ?Drive an automobile ?Make any legal decisions ?Drink any alcoholic beverage ? ? ?You may resume regular meals tomorrow.  Today it is better to start with liquids and gradually work up to solid foods. ? ?You may eat anything you prefer, but it is better to start with liquids, then soup and crackers, and gradually work up to solid foods. ? ? ?Please notify your doctor immediately if you have any unusual bleeding, trouble breathing, redness and pain at the surgery site, drainage, fever, or pain not relieved by medication. ? ? ? ?Additional Instructions: ? ? ? ?Please contact your physician with any problems or Same Day Surgery at [phone], Monday through Friday 6 am to 4 pm, or [facility] [place] Main number at [phone].  ?

## 2022-09-11 NOTE — Progress Notes (Signed)
Here for FX D+C and H/S  for PMB and thickened stripe Labs reviewed . All questions answered  Proceed

## 2022-09-11 NOTE — Brief Op Note (Signed)
09/11/2022  9:33 AM  PATIENT:  Haley Deleon  66 y.o. female  PRE-OPERATIVE DIAGNOSIS:  Postmenopausal bleeding, thickened endometrial stripe  POST-OPERATIVE DIAGNOSIS:  Postmenopausal bleeding, thickened endometrial stripe  PROCEDURE:  Procedure(s): FRACTIONAL DILATATION AND CURETTAGE /HYSTEROSCOPY (N/A)  SURGEON:  Surgeon(s) and Role:    * Phoebie Shad, Gwen Her, MD - Primary  PHYSICIAN ASSISTANT:   ASSISTANTS: none   ANESTHESIA:   general  EBL:  min . Iof 700 cc ou 150 cc   BLOOD ADMINISTERED:none  DRAINS: none   LOCAL MEDICATIONS USED:  NONE  SPECIMEN:  Source of Specimen:  ecc and endometrial currettings   DISPOSITION OF SPECIMEN:  PATHOLOGY  COUNTS:  YES  TOURNIQUET:  * No tourniquets in log *  DICTATION: .Other Dictation: Dictation Number verbal  PLAN OF CARE: Discharge to home after PACU  PATIENT DISPOSITION:  PACU - hemodynamically stable.   Delay start of Pharmacological VTE agent (>24hrs) due to surgical blood loss or risk of bleeding: not applicable

## 2022-09-11 NOTE — Anesthesia Preprocedure Evaluation (Addendum)
Anesthesia Evaluation  Patient identified by MRN, date of birth, ID band Patient awake    Reviewed: Allergy & Precautions, NPO status , [name] Chart, lab work & pertinent test results  Airway Mallampati: III  TM Distance: >3 FB Neck ROM: full    Dental  (+) Teeth Intact   Pulmonary neg pulmonary ROS, Patient abstained from smoking., former smoker   Pulmonary exam normal  + decreased breath sounds      Cardiovascular Exercise Tolerance: Good + CAD  negative cardio ROS Normal cardiovascular exam Rhythm:Regular Rate:Normal     Neuro/Psych   Anxiety     negative neurological ROS  negative psych ROS   GI/Hepatic negative GI ROS, Neg liver ROS, PUD,,,  Endo/Other  negative endocrine ROS    Renal/GU negative Renal ROS  negative genitourinary   Musculoskeletal   Abdominal  (+) + obese  Peds negative pediatric ROS (+)  Hematology negative hematology ROS (+) Blood dyscrasia, anemia   Anesthesia Other Findings Past Medical History: No date: Anemia No date: Atypical chest pain No date: Carotid arterial disease ([place])     Comment:  a. 11/2017 Carotid U/S: RICA <59, LICA nl. No date: Claustrophobia No date: Coronary artery disease No date: Diastolic dysfunction     Comment:  a. 11/2017 Echo: EF 60-65%, no rwma, Gr1 DD. Nl RV fxn. No date: Dysrhythmia No date: Family history of adverse reaction to anesthesia     Comment:  a.) early emergence in 3rd degree relative (1st cousin) No date: Heart murmur No date: Liver cyst No date: Pneumonia No date: Ulcerative colitis ([place]) No date: Venous insufficiency  Past Surgical History: 11/02/2011: BREAST BIOPSY; Right     Comment:  ultrasound guided biopsy/clip-benign 07/29/2017: CHOLECYSTECTOMY; N/A     Comment:  Procedure: LAPAROSCOPIC CHOLECYSTECTOMY;  Surgeon:               Jules Husbands, MD;  Location: [facility] ORS;  Service:               General;  Laterality: N/A; 2014,  2015: COLON SURGERY     Comment:  DUE TO ULERATIVE COLITIS WITH COLOSTOMY No date: COLOSTOMY TAKEDOWN No date: FOOT SURGERY; Bilateral     Comment:  BONE SPURS  1991: MANDIBLE FRACTURE SURGERY No date: MOUTH SURGERY 07/29/2017: VENTRAL HERNIA REPAIR; N/A     Comment:  Procedure: LAPAROSCOPIC VENTRAL HERNIA;  Surgeon: Jules Husbands, MD;  Location: [facility];  Service: General;                Laterality: N/A;  BMI    Body Mass Index: 45.25 kg/m      Reproductive/Obstetrics negative OB ROS                             Anesthesia Physical Anesthesia Plan  ASA: 3  Anesthesia Plan: General   Post-op Pain Management:    Induction: Intravenous  PONV Risk Score and Plan: Ondansetron, Dexamethasone, Midazolam and Treatment may vary due to age or medical condition  Airway Management Planned: Oral ETT  Additional Equipment:   Intra-op Plan:   Post-operative Plan: Extubation in OR  Informed Consent: I have reviewed the patients History and Physical, chart, labs and discussed the procedure including the risks, benefits and alternatives for the proposed anesthesia with the patient or authorized representative who has indicated his/her understanding and acceptance.  Dental Advisory Given  Plan Discussed with: CRNA and Surgeon  Anesthesia Plan Comments:        Anesthesia Quick Evaluation

## 2022-09-11 NOTE — Transfer of Care (Signed)
Immediate Anesthesia Transfer of Care Note  Patient: Haley Deleon  Procedure(s) Performed: FRACTIONAL DILATATION AND CURETTAGE /HYSTEROSCOPY (Uterus)  Patient Location: PACU  Anesthesia Type:General  Level of Consciousness: awake  Airway & Oxygen Therapy: Patient Spontanous Breathing  Post-op Assessment: Report given to RN and Post -op Vital signs reviewed and stable  Post vital signs: Reviewed and stable  Last Vitals:  Vitals Value Taken Time  BP 181/109 09/11/22 0942  Temp 36.3 C 09/11/22 0942  Pulse 80 09/11/22 0945  Resp 17 09/11/22 0945  SpO2 95 % 09/11/22 0945  Vitals shown include unvalidated device data.  Last Pain:  Vitals:   09/11/22 0624  TempSrc: Oral  PainSc: 0-No pain         Complications: No notable events documented.

## 2022-09-11 NOTE — Anesthesia Postprocedure Evaluation (Signed)
Anesthesia Post Note  Patient: Haley Deleon  Procedure(s) Performed: FRACTIONAL DILATATION AND CURETTAGE /HYSTEROSCOPY (Uterus)  Patient location during evaluation: PACU Anesthesia Type: General Level of consciousness: awake and awake and alert Pain management: pain level controlled Vital Signs Assessment: post-procedure vital signs reviewed and stable Respiratory status: nonlabored ventilation and respiratory function stable Cardiovascular status: stable Anesthetic complications: no  No notable events documented.   Last Vitals:  Vitals:   09/11/22 1015 09/11/22 1033  BP: 125/69 (!) 116/99  Pulse: 80 91  Resp: 18 20  Temp: 36.6 C (!) 36.3 C  SpO2: 94% 95%    Last Pain:  Vitals:   09/11/22 1033  TempSrc: Temporal  PainSc: 0-No pain                 VAN STAVEREN,Tajae Maiolo

## 2022-09-12 ENCOUNTER — Encounter: Payer: Self-pay | Admitting: Obstetrics and Gynecology

## 2022-09-15 ENCOUNTER — Encounter: Payer: Self-pay | Admitting: Obstetrics and Gynecology

## 2022-09-15 LAB — SURGICAL PATHOLOGY

## 2022-09-15 NOTE — Op Note (Signed)
NAME: Haley Deleon, Haley Deleon MEDICAL RECORD NO: [id number] ACCOUNT NO: [id number] DATE OF BIRTH: 1955-10-14 FACILITY: [facility] LOCATION: [facility] PHYSICIAN: Boykin Nearing, MD  Operative Report   DATE OF PROCEDURE: 09/11/2022  PREOPERATIVE DIAGNOSES: 1.  Postmenopausal bleeding. 2.  Thickened endometrial stripe.  POSTOPERATIVE DIAGNOSIS:  Postmenopausal bleeding.  PROCEDURE:   1.  Fractional dilation and curettage. 2.  Hysteroscopy.  SURGEON:  Boykin Nearing, MD  ANESTHESIA:  General endotracheal anesthesia.  INDICATIONS:  A 66 year old female with postmenopausal bleeding and an exam that could not be completed in the office.  The patient has an ultrasound that shows endometrial stripe to be 14 mm.  DESCRIPTION OF PROCEDURE:  After adequate general endotracheal anesthesia, the patient was placed in dorsal supine position with legs placed in the candy cane stirrups.  The patient's lower abdomen, perineum were prepped and draped in normal sterile  fashion.  Vagina was prepped as well.  The patient did receive 3 grams IV Ancef prior to surgery for surgical prophylaxis.  Examination revealed the cervix to be extremely high underneath the symphysis pubis.  Straight catheterization of the bladder  yielded 150 mL concentrated urine.  Timeout was performed.  A weighted speculum was placed in the posterior vaginal vault and Sims retractor was used to identify the cervix that was again extremely high in the vault.  Single tooth tenaculum was applied  to the anterior lip of the cervix and cervix was then able to be visualized in its entirety.  An endocervical curettage was performed followed by dilation #19 Hanks dilator.  Uterine sounded to 11 cm.  Endometrial curettage was then performed with scant  tissue noted.  The hysteroscope was then advanced into the endometrial cavity without difficulty.  There were no polyps and seemingly the lining seemed to be atrophic more than  hyperplastic.  Pictures were taken.  Hysteroscope was removed and silver  nitrate was applied to the tenaculum site.  The patient tolerated the procedure well.  There were no complications.  ESTIMATED BLOOD LOSS:  Minimal.  INTRAOPERATIVE FLUIDS:  700 mL  URINE OUTPUT:  150 mL  The patient was taken to recovery room in good condition.     PAA D: 09/11/2022 9:45:55 am T: 09/11/2022 10:43:00 am  JOB: [id number]/ [id number]

## 2022-09-22 ENCOUNTER — Ambulatory Visit
Admission: RE | Admit: 2022-09-22 | Discharge: 2022-09-22 | Disposition: A | Discharge status: Home / Self Care | Payer: Federal, State, Local not specified - PPO | Source: Ambulatory Visit | Attending: Obstetrics and Gynecology | Admitting: Obstetrics and Gynecology

## 2022-09-22 DIAGNOSIS — Z1231 Encounter for screening mammogram for malignant neoplasm of breast: Secondary | ICD-10-CM | POA: Insufficient documentation

## 2023-02-19 ENCOUNTER — Telehealth: Payer: Self-pay | Admitting: Internal Medicine

## 2023-02-19 NOTE — Telephone Encounter (Signed)
Lvm to pt regarding moving her appt time from 3:45 to 2:45 also called pt significant other to reschedule appt he stated that he will call her.

## 2023-02-26 ENCOUNTER — Ambulatory Visit: Payer: Federal, State, Local not specified - PPO | Admitting: Internal Medicine

## 2023-03-16 ENCOUNTER — Ambulatory Visit: Payer: Federal, State, Local not specified - PPO | Admitting: Internal Medicine

## 2023-03-16 VITALS — BP 120/90 | HR 97 | Temp 98.3°F | Ht 67.5 in | Wt 301.2 lb

## 2023-03-16 DIAGNOSIS — K51919 Ulcerative colitis, unspecified with unspecified complications: Secondary | ICD-10-CM

## 2023-03-16 DIAGNOSIS — R062 Wheezing: Secondary | ICD-10-CM | POA: Diagnosis not present

## 2023-03-16 DIAGNOSIS — K219 Gastro-esophageal reflux disease without esophagitis: Secondary | ICD-10-CM

## 2023-03-16 DIAGNOSIS — R0602 Shortness of breath: Secondary | ICD-10-CM

## 2023-03-16 NOTE — Progress Notes (Unsigned)
[facility] [address] [place], [place] [address]  Pulmonary Sleep Medicine   Office Visit Note  Patient Name: Haley Deleon DOB: 1956/05/31 MRN [id number]  Date of Service: 03/16/2023  Complaints/HPI: She was recently in the ER with "walking pneumonia". She was treated and sent home. She feels congested also at the time. She does not note heartburn she does have a cough. She has been drinking a lot of milk and OJ. She states she has no fevers or chills. She denies having chest pain. She is not taking inhalers. She was a former smoker. She was seen in the ED in 2023 and had CT chest done as well as CXR. She has had rib fractures from coughing. Also of note she had a ventral hernia. She does have a butterfly rash on her face. She states she has had this appearance for a long time. She states she has not been on any inhalers and is hesitant to consider. She has also been snoring. She also has had a very easy time falling asleep. She does nap during the day midday. Her BMI is 46.48 and has been the same weight for a while    Office Spirometry Results:     ROS  General: (-) fever, (-) chills, (-) night sweats, (-) weakness Skin: (-) rashes, (-) itching,. Eyes: (-) visual changes, (-) redness, (-) itching. Nose and Sinuses: (-) nasal stuffiness or itchiness, (-) postnasal drip, (-) nosebleeds, (-) sinus trouble. Mouth and Throat: (-) sore throat, (-) hoarseness. Neck: (-) swollen glands, (-) enlarged thyroid, (-) neck pain. Respiratory: + cough, (-) bloody sputum, - shortness of breath, - wheezing. Cardiovascular: - ankle swelling, (-) chest pain. Lymphatic: (-) lymph node enlargement. Neurologic: (-) numbness, (-) tingling. Psychiatric: (-) anxiety, (-) depression   Current Medication: Outpatient Encounter Medications as of 03/16/2023  Medication Sig   escitalopram (LEXAPRO) 10 MG tablet Take 10 mg by mouth daily.   ferrous sulfate 325 (65 FE) MG tablet Take 325 mg  by mouth daily as needed.    hydrOXYzine (ATARAX) 10 MG tablet Take 10 mg by mouth 3 (three) times daily as needed for anxiety.   loperamide (IMODIUM) 2 MG capsule Take 2 mg as needed by mouth for diarrhea or loose stools (NORMALLY TAKES 2-3 TIMES DAILY SINCE HAVING HER COLON SURGERY).    Multiple Vitamin (MULTIVITAMIN) tablet Take 1 tablet by mouth daily.   No facility-administered encounter medications on file as of 03/16/2023.    Surgical History: Past Surgical History:  Procedure Laterality Date   BREAST BIOPSY Right 11/02/2011   ultrasound guided biopsy/clip-benign   CHOLECYSTECTOMY N/A 07/29/2017   Procedure: LAPAROSCOPIC CHOLECYSTECTOMY;  Surgeon: Leafy Ro, MD;  Location: [facility];  Service: General;  Laterality: N/A;   COLON SURGERY  2014, 2015   DUE TO ULERATIVE COLITIS WITH COLOSTOMY   COLOSTOMY TAKEDOWN     FOOT SURGERY Bilateral    BONE SPURS    HYSTEROSCOPY WITH D & C N/A 09/11/2022   Procedure: FRACTIONAL DILATATION AND CURETTAGE /HYSTEROSCOPY;  Surgeon: Schermerhorn, Ihor Austin, MD;  Location: [facility] OR[facility];  Service: Gynecology;  Laterality: N/A;   MANDIBLE FRACTURE SURGERY  1991   MOUTH SURGERY     VENTRAL HERNIA REPAIR N/A 07/29/2017   Procedure: LAPAROSCOPIC VENTRAL HERNIA;  Surgeon: Leafy Ro, MD;  Location: [facility];  Service: General;  Laterality: N/A;    Medical History: Past Medical History:  Diagnosis Date   Anemia    Atypical chest pain  Carotid arterial disease (HCC)    a. 11/2017 Carotid U/S: RICA <50, LICA nl.   Claustrophobia    Coronary artery disease    Diastolic dysfunction    a. 11/2017 Echo: EF 60-65%, no rwma, Gr1 DD. Nl RV fxn.   Dysrhythmia    Family history of adverse reaction to anesthesia    a.) early emergence in 3rd degree relative (1st cousin)   Heart murmur    Liver cyst    Pneumonia    Ulcerative colitis (HCC)    Venous insufficiency     Family History: Family History  Problem Relation Age of Onset   Heart attack Mother  12   Hyperlipidemia Brother    Breast cancer Neg Hx     Social History: Social History   Socioeconomic History   Marital status: Significant Other    Spouse name: Not on file   Number of children: Not on file   Years of education: Not on file   Highest education level: Not on file  Occupational History   Not on file  Tobacco Use   Smoking status: Former    Packs/day: 1.00    Years: 20.00    Additional pack years: 0.00    Total pack years: 20.00    Types: Cigarettes    Quit date: 07/28/2005    Years since quitting: 17.6   Smokeless tobacco: Never  Vaping Use   Vaping Use: Never used  Substance and Sexual Activity   Alcohol use: Yes    Comment: WINE   Drug use: No   Sexual activity: Not on file  Other Topics Concern   Not on file  Social History Narrative   LIVES WITH DAUGHTER   Social Determinants of Health   Financial Resource Strain: Not on file  Food Insecurity: Not on file  Transportation Needs: Not on file  Physical Activity: Not on file  Stress: Not on file  Social Connections: Not on file  Intimate Partner Violence: Not on file    Vital Signs: Blood pressure (!) 120/90, pulse 97, temperature 98.3 F (36.8 C), height 5' 7.5" (1.715 m), weight (!) 301 lb 3.2 oz (136.6 kg), SpO2 96 %.  Examination: General Appearance: The patient is well-developed, well-nourished, and in no distress. Skin: Gross inspection of skin unremarkable. Head: normocephalic, no gross deformities. Eyes: no gross deformities noted. ENT: ears appear grossly normal no exudates. Neck: Supple. No thyromegaly. No LAD. Respiratory: no rhonchi noted. Cardiovascular: Normal S1 and S2 without murmur or rub. Extremities: No cyanosis. pulses are equal. Neurologic: Alert and oriented. No involuntary movements.  LABS: No results found for this or any previous visit (from the past 2160 hour(s)).  Radiology: MM 3D SCREEN BREAST BILATERAL  Result Date: 09/24/2022 CLINICAL DATA:  Screening.  EXAM: DIGITAL SCREENING BILATERAL MAMMOGRAM WITH TOMOSYNTHESIS AND CAD TECHNIQUE: Bilateral screening digital craniocaudal and mediolateral oblique mammograms were obtained. Bilateral screening digital breast tomosynthesis was performed. The images were evaluated with computer-aided detection. COMPARISON:  Previous exam(s). ACR Breast Density Category a: The breast tissue is almost entirely fatty. FINDINGS: There are no findings suspicious for malignancy. IMPRESSION: No mammographic evidence of malignancy. A result letter of this screening mammogram will be mailed directly to the patient. RECOMMENDATION: Screening mammogram in one year. (Code:SM-B-01Y) BI-RADS CATEGORY  1: Negative. Electronically Signed   By: Amie Portland M.D.   On: 09/24/2022 09:48    No results found.  No results found.  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 Wheeze I suggested workup including an UGI she does not want this done right now.  She stated that right now she only wants a chest x-ray to be done so therefore I will get the chest x-ray done.  If she feels there is need to follow-up she will follow-up she stated.  At this point I will get an x-ray I will follow-up on the film and will  come back to the office as needed  2. Ulcerative colitis with complication, unspecified location [facility]) In the past has had surgery and resection of the colon.  She states that now this is not really an issue for her  3. Obesity, morbid (HCC) Obesity Counseling: Had a lengthy discussion regarding patients BMI and weight issues. Patient was instructed on portion control as well as increased activity. Also discussed caloric restrictions with trying to maintain intake less than 2000 Kcal. Discussions were made in accordance with the 5As of weight management. Simple actions such as not eating late and if able to, taking a walk is suggested.   4. GERD without esophagitis She does not want to have a UGI evaluation - SLP modified barium swallow; Future  5. Shortness of breath - DG Chest 2 View; Future - Pneumonia finding in chest x-ray   General Counseling: I have discussed the findings of the evaluation and examination with [facility].  I have also discussed any further diagnostic evaluation thatmay be needed or ordered today. Caydee verbalizes understanding of the findings of todays visit. We also reviewed her medications today and discussed drug interactions and side effects including but not limited excessive drowsiness and altered mental states. We also discussed that there is always a risk not just to her but also people around her. she has been encouraged to call the office with any questions or concerns that should arise related to todays visit.  Orders Placed This Encounter  Procedures   DG Chest 2 View    Standing Status:   Future    Standing Expiration Date:   03/15/2024    Order Specific Question:   Reason for Exam (SYMPTOM  OR DIAGNOSIS REQUIRED)    Answer:   follow up pneumonia    Order Specific Question:   Preferred imaging location?    Answer:   [facility]   SLP modified barium swallow    Standing Status:   Future    Standing Expiration Date:   03/15/2024    Order Specific  Question:   Where should this test be performed:    Answer:   Other     Time spent: 53  I have personally obtained a history, examined the patient, evaluated laboratory and imaging results, formulated the assessment and plan and placed orders.    Yevonne Pax, MD [facility] Pulmonary and Critical Care Sleep medicine

## 2023-04-05 ENCOUNTER — Ambulatory Visit: Payer: Federal, State, Local not specified - PPO | Admitting: Physician Assistant

## 2023-06-10 IMAGING — US US EXTREM  UP VENOUS*L*
1 series · 13 of 24 positions shown · non-contrast
Comparison: None.

CLINICAL DATA: Pain and swelling



[Series 1: us venous img upper uni left (dvt) · portal-venous · 13 of 37 slices shown]
[im 1/37]
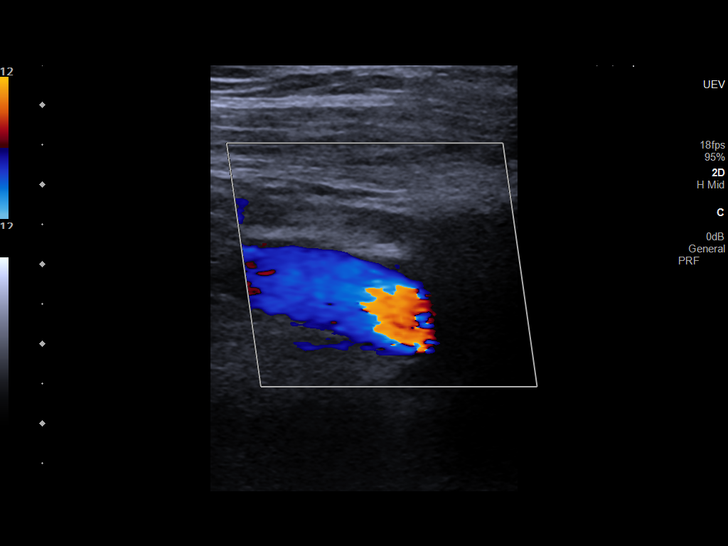
[im 4/37]
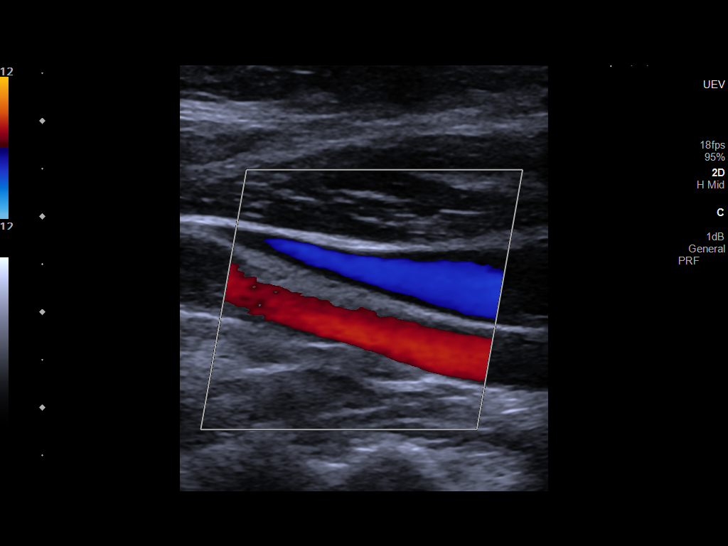
[im 7/37]
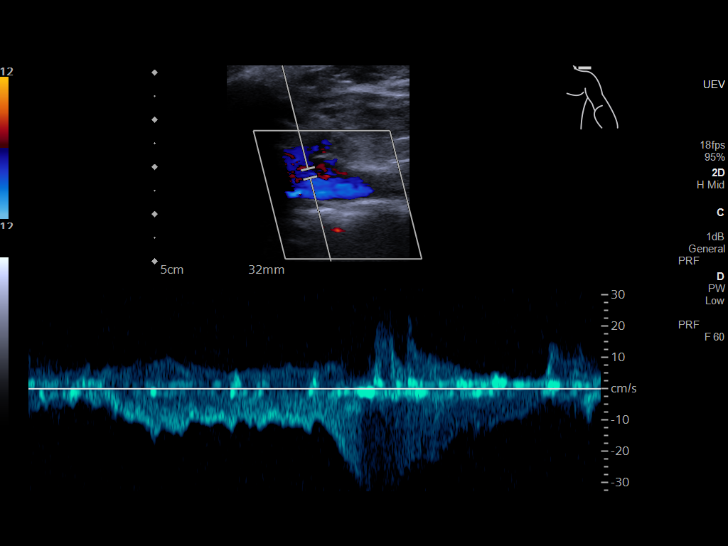
[im 10/37]
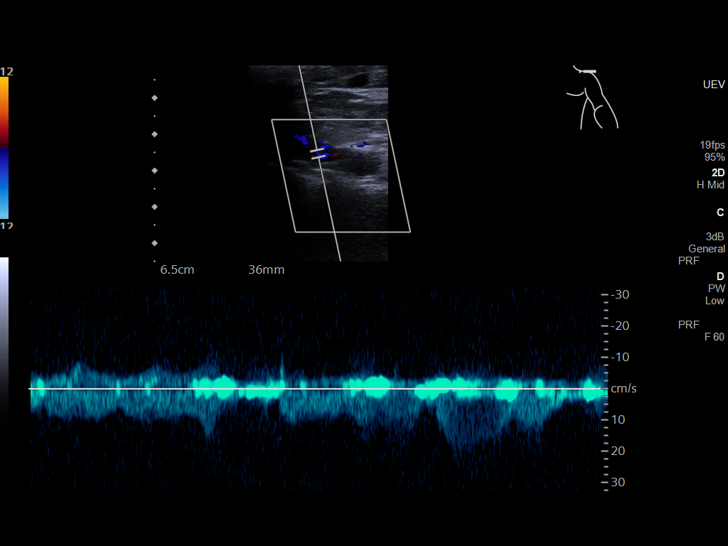
[im 13/37]
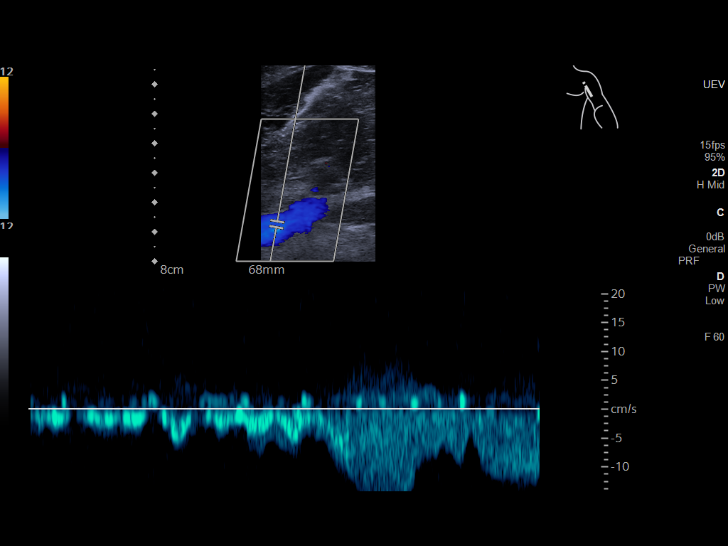
[im 16/37]
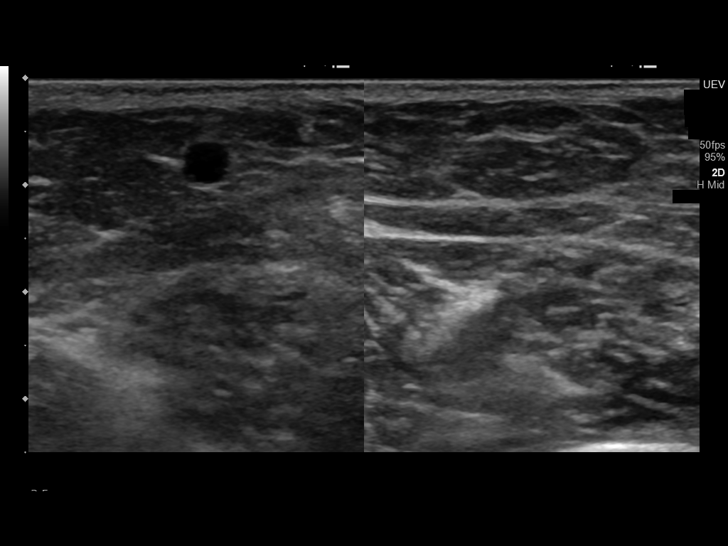
[im 19/37]
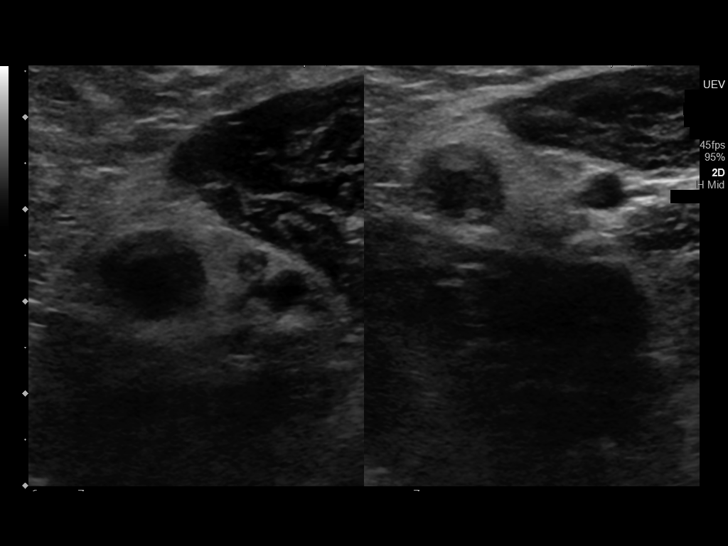
[im 21/37]
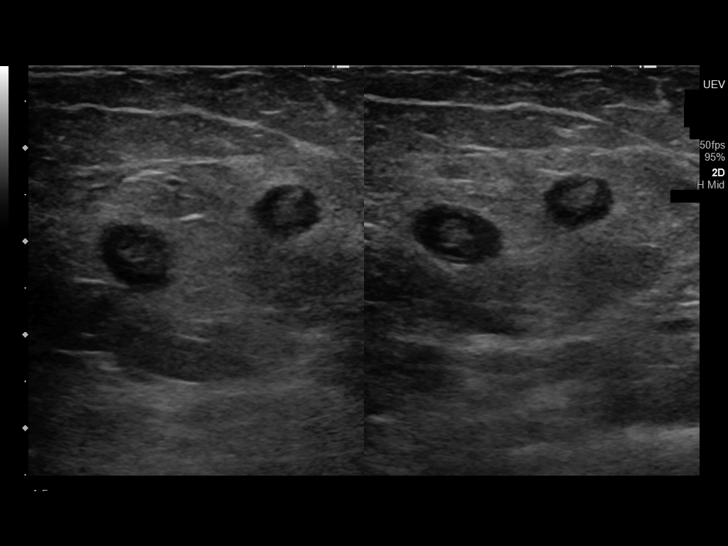
[im 24/37]
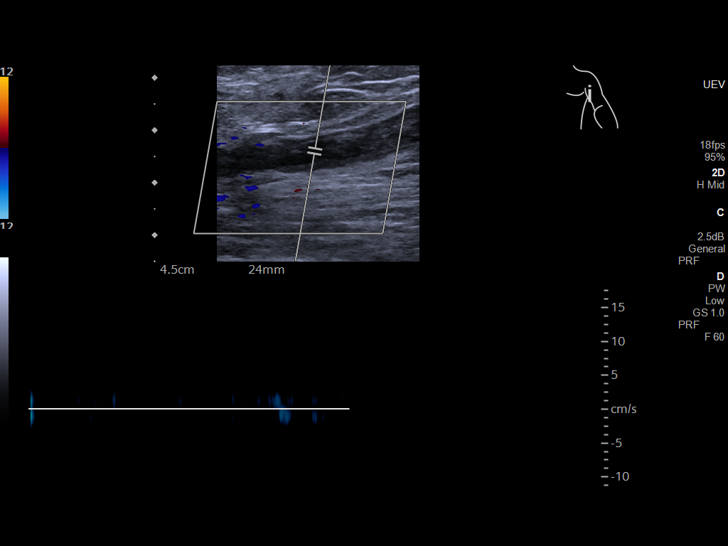
[im 27/37]
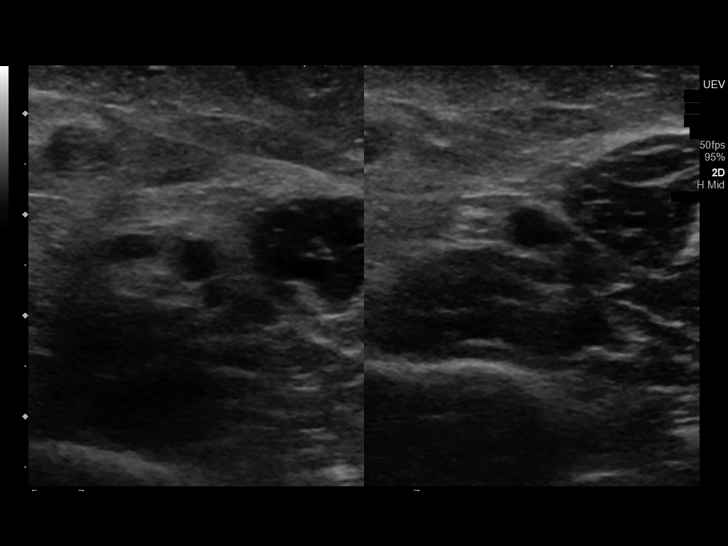
[im 30/37]
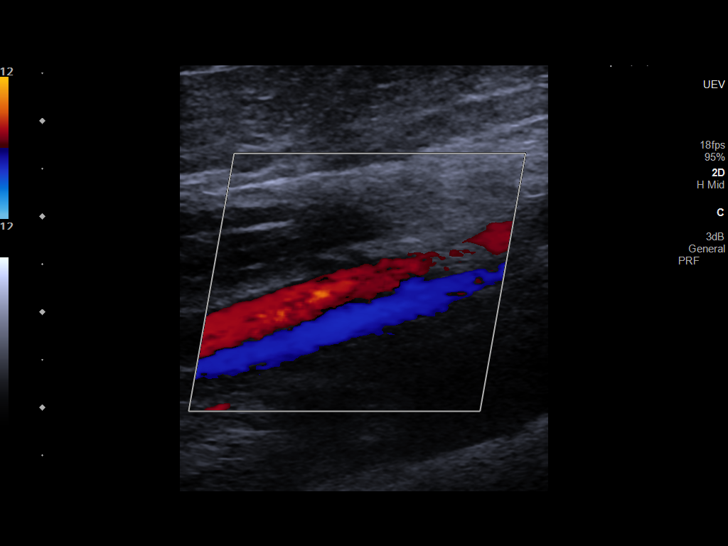
[im 33/37]
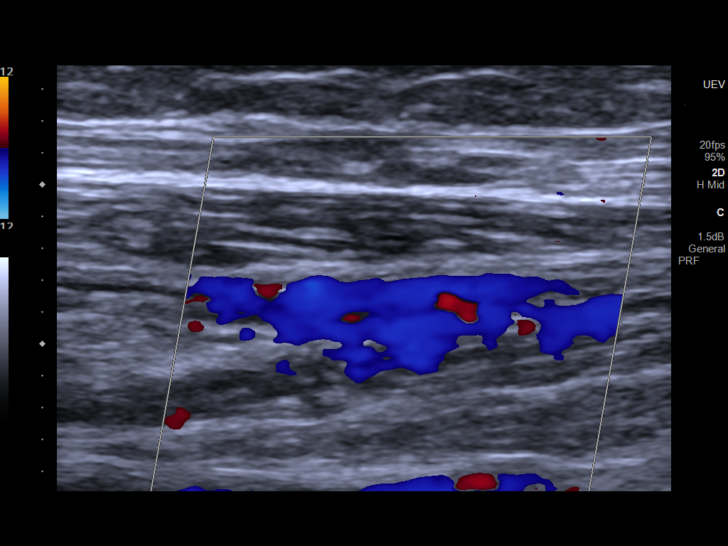
[im 37/37]
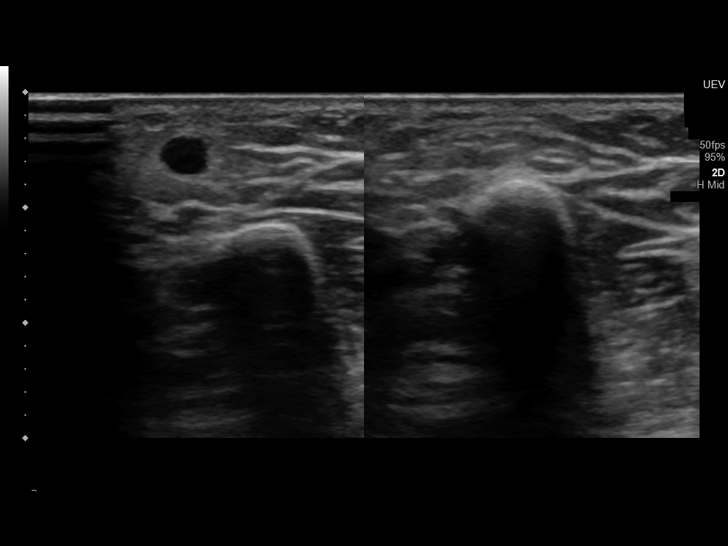

[13 of 24 positions shown; findings below may reference images not displayed]

FINDINGS: Contralateral Subclavian Vein: Respiratory phasicity is normal and
symmetric with the symptomatic side. No evidence of thrombus. Normal
compressibility.

Internal Jugular Vein: No evidence of thrombus. Normal
compressibility, respiratory phasicity and response to augmentation.

Subclavian Vein: No evidence of thrombus. Normal compressibility,
respiratory phasicity and response to augmentation.

Axillary Vein: No evidence of thrombus. Normal compressibility,
respiratory phasicity and response to augmentation.

Cephalic Vein: No evidence of thrombus. Normal compressibility,
respiratory phasicity and response to augmentation.

Basilic Vein: Occlusive superficial phlebitis is noted in basilic
vein.

Brachial Veins: No evidence of thrombus. Normal compressibility,
respiratory phasicity and response to augmentation.

Radial Veins: No evidence of thrombus. Normal compressibility,
respiratory phasicity and response to augmentation.

Ulnar Veins: No evidence of thrombus. Normal compressibility,
respiratory phasicity and response to augmentation.

Venous Reflux:  None visualized.

Other Findings:  None visualized.
IMPRESSION: No evidence of DVT within the left upper extremity.

Superficial phlebitis is noted in left basilic vein.

## 2024-05-09 ENCOUNTER — Encounter: Payer: Self-pay | Admitting: Emergency Medicine

## 2024-05-09 ENCOUNTER — Ambulatory Visit
Admission: EM | Admit: 2024-05-09 | Discharge: 2024-05-09 | Disposition: A | Discharge status: Home / Self Care | Attending: Emergency Medicine | Admitting: Emergency Medicine

## 2024-05-09 DIAGNOSIS — L03116 Cellulitis of left lower limb: Secondary | ICD-10-CM

## 2024-05-09 MED ORDER — CEPHALEXIN 500 MG PO CAPS: 500.0000 mg | ORAL_CAPSULE | Freq: Three times a day (TID) | ORAL | 0 refills | Status: AC

## 2024-05-09 NOTE — ED Provider Notes (Signed)
 [facility]    CSN: [id number] Arrival date & time: 05/09/24  1329      History   Chief Complaint Chief Complaint  Patient presents with   Insect Bite    HPI Haley Deleon is a 68 y.o. female.   HPI  68 year old female with past medical history significant for PSVT, cardiac murmur, cellulitis, ulcerative colitis, and lower extremity edema presents for evaluation of a possible insect bite to her left shin that she noticed about a week ago.  The area is red and when she presses on the area a clear yellow fluid will sometimes be expressed.  She denies fever.  Past Medical History:  Diagnosis Date   Anemia    Atypical chest pain    Carotid arterial disease (HCC)    a. 11/2017 Carotid U/S: RICA <50, LICA nl.   Claustrophobia    Coronary artery disease    Diastolic dysfunction    a. 11/2017 Echo: EF 60-65%, no rwma, Gr1 DD. Nl RV fxn.   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, bilateral 07/09/2020   Varicose veins of leg with pain, left 06/14/2020   Dizziness 11/17/2017   Murmur, cardiac 11/17/2017   Screening for cardiovascular condition 11/17/2017   Lower extremity edema 11/17/2017   Gallstones and inflammation of gallbladder without obstruction    Incisional hernia, without obstruction or gangrene    History of biliary T-tube placement 02/24/2014   Cellulitis 02/24/2014   Cellulitis and abscess 02/24/2014   Ulcerative colitis (HCC) 08/22/2013    Past Surgical History:  Procedure Laterality Date    BREAST BIOPSY Right 11/02/2011   ultrasound guided biopsy/clip-benign   CHOLECYSTECTOMY N/A 07/29/2017   Procedure: LAPAROSCOPIC CHOLECYSTECTOMY;  Surgeon: Jordis Laneta FALCON, MD;  Location: [facility];  Service: General;  Laterality: N/A;   COLON SURGERY  2014, 2015   DUE TO ULERATIVE COLITIS WITH COLOSTOMY   COLOSTOMY TAKEDOWN     FOOT SURGERY Bilateral    BONE SPURS    HYSTEROSCOPY WITH D & C N/A 09/11/2022   Procedure: FRACTIONAL DILATATION AND CURETTAGE /HYSTEROSCOPY;  Surgeon: Schermerhorn, Debby PARAS, MD;  Location: [facility];  Service: Gynecology;  Laterality: N/A;   MANDIBLE FRACTURE SURGERY  1991   MOUTH SURGERY     VENTRAL HERNIA REPAIR N/A 07/29/2017   Procedure: LAPAROSCOPIC VENTRAL HERNIA;  Surgeon: Jordis Laneta FALCON, MD;  Location: [facility];  Service: General;  Laterality: N/A;    OB History   No obstetric history on file.      Home Medications    Prior to Admission medications   Medication Sig Start Date End Date Taking? Authorizing Provider  cephALEXin  (KEFLEX ) 500 MG capsule Take 1 capsule (500 mg total) by mouth 3 (three) times daily for 7 days. 05/09/24 05/16/24 Yes Bernardino Ditch, NP  escitalopram  (LEXAPRO ) 10 MG tablet Take 10 mg by mouth daily.    [provider]  ferrous sulfate 325 (65 FE) MG tablet Take 325 mg by mouth daily as needed.  [provider]  hydrOXYzine (ATARAX) 10 MG tablet Take 10 mg by mouth 3 (three) times daily as needed for anxiety.    [provider]  loperamide  (IMODIUM ) 2 MG capsule Take 2 mg as needed by mouth for diarrhea or loose stools (NORMALLY TAKES 2-3 TIMES DAILY SINCE HAVING HER COLON SURGERY).     [provider]  Multiple Vitamin (MULTIVITAMIN) tablet Take 1 tablet by mouth daily.    [provider]    Family History Family History  Problem Relation Age of Onset   Heart attack Mother 4   Hyperlipidemia Brother    Breast cancer Neg Hx     Social History Social History   Tobacco Use    Smoking status: Former    Current packs/day: 0.00    Average packs/day: 1 pack/day for 20.0 years (20.0 ttl pk-yrs)    Types: Cigarettes    Start date: 07/28/1985    Quit date: 07/28/2005    Years since quitting: 18.7   Smokeless tobacco: Never  Vaping Use   Vaping status: Never Used  Substance Use Topics   Alcohol use: Yes    Comment: WINE   Drug use: No     Allergies   Caffeine, Chocolate, Chocolate flavoring agent (non-screening), Cocoa, and Meloxicam   Review of Systems Review of Systems  Constitutional:  Negative for fever.  Skin:  Positive for color change and wound.     Physical Exam Triage Vital Signs ED Triage Vitals  Encounter Vitals Group     BP      Girls Systolic BP Percentile      Girls Diastolic BP Percentile      Boys Systolic BP Percentile      Boys Diastolic BP Percentile      Pulse      Resp      Temp      Temp src      SpO2      Weight      Height      Head Circumference      Peak Flow      Pain Score      Pain Loc      Pain Education      Exclude from Growth Chart    No data found.  Updated Vital Signs BP (!) 158/99 (BP Location: Left Wrist)   Pulse 84   Temp 97.7 F (36.5 C) (Oral)   Resp 16   Wt 272 lb 4.8 oz (123.5 kg)   SpO2 95%   BMI 42.02 kg/m   Visual Acuity Right Eye Distance:   Left Eye Distance:   Bilateral Distance:    Right Eye Near:   Left Eye Near:    Bilateral Near:     Physical Exam Vitals and nursing note reviewed.  Constitutional:      Appearance: Normal appearance. She is not ill-appearing.  HENT:     Head: Normocephalic and atraumatic.  Skin:    General: Skin is warm and dry.     Capillary Refill: Capillary refill takes less than 2 seconds.     Findings: Erythema present.  Neurological:     General: No focal deficit present.     Mental Status: She is alert and oriented to person, place, and time.      UC Treatments / Results  Labs (all labs ordered are listed, but only abnormal  results are displayed) Labs Reviewed - No data to display  EKG   Radiology No results  found.  Procedures Procedures (including critical care time)  Medications Ordered in UC Medications - No data to display  Initial Impression / Assessment and Plan / UC Course  I have reviewed the triage vital signs and the nursing notes.  Pertinent labs & imaging results that were available during my care of the patient were reviewed by me and considered in my medical decision making (see chart for details).   Patient is a nontoxic-appearing 68 year old female presenting for evaluation of cellulitis to her left lower extremity.  As you can see image above, there is a scabbed area in the lower part of the photograph as well as a red macular lesion on the upper aspect of the photograph that may represent sites of envenomation.  Patient does not remember feeling or seeing anything bite her.  She reports that the redness and swelling has been present for the last week.  No fever.  The area of erythema is much warmer to touch than the surrounding skin as well as mildly tender to touch.  No induration or fluctuance to suggest abscess formation.  Her DP and PT pulses are 2+.  I will treat the patient for cellulitis with Keflex  500 mg 3 times daily x 7 days.  I have advised her to elevate her leg to help with the swelling and to monitor the redness for any increasing redness, red streaks going up her leg, pus drainage from the wound, or fever.  If these conditions arise she should return for reevaluation.   Final Clinical Impressions(s) / UC Diagnoses   Final diagnoses:  Cellulitis of left lower extremity     Discharge Instructions      Take the Keflex , 500 mg 3 times a day with food, for the next 7 days for treatment of your cellulitis.  Keep your left leg elevated is much as possible to help with the swelling and aid in healing.  Monitor for any increase in redness, red streaks going up your leg,  pus drainage from the wound, or fever.  If these conditions arise please return for reevaluation or see your primary care provider.     ED Prescriptions     Medication Sig Dispense Auth. Provider   cephALEXin  (KEFLEX ) 500 MG capsule Take 1 capsule (500 mg total) by mouth 3 (three) times daily for 7 days. 21 capsule Bernardino Ditch, NP      PDMP not reviewed this encounter.   Bernardino Ditch, NP 05/09/24 [phone]

## 2024-05-09 NOTE — ED Triage Notes (Signed)
 Pt presents with a ?insect bite to her left shin x 1 week. Pt states there is fluid coming out of it when pressed.

## 2024-05-09 NOTE — Discharge Instructions (Addendum)
 Take the Keflex , 500 mg 3 times a day with food, for the next 7 days for treatment of your cellulitis.  Keep your left leg elevated is much as possible to help with the swelling and aid in healing.  Monitor for any increase in redness, red streaks going up your leg, pus drainage from the wound, or fever.  If these conditions arise please return for reevaluation or see your primary care provider.

## 2024-06-07 ENCOUNTER — Ambulatory Visit
Admission: EM | Admit: 2024-06-07 | Discharge: 2024-06-07 | Disposition: A | Discharge status: Home / Self Care | Attending: Family Medicine | Admitting: Family Medicine

## 2024-06-07 DIAGNOSIS — K122 Cellulitis and abscess of mouth: Secondary | ICD-10-CM | POA: Insufficient documentation

## 2024-06-07 DIAGNOSIS — J029 Acute pharyngitis, unspecified: Secondary | ICD-10-CM | POA: Diagnosis present

## 2024-06-07 DIAGNOSIS — Z20822 Contact with and (suspected) exposure to covid-19: Secondary | ICD-10-CM | POA: Insufficient documentation

## 2024-06-07 LAB — RESP PANEL BY RT-PCR (FLU A&B, COVID) ARPGX2
Influenza A by PCR: NEGATIVE
Influenza B by PCR: NEGATIVE
SARS Coronavirus 2 by RT PCR: NEGATIVE

## 2024-06-07 MED ORDER — PREDNISONE 10 MG (21) PO TBPK: ORAL_TABLET | Freq: Every day | ORAL | 0 refills | Status: DC

## 2024-06-07 MED ORDER — AMOXICILLIN 500 MG PO CAPS: 500.0000 mg | ORAL_CAPSULE | Freq: Two times a day (BID) | ORAL | 0 refills | Status: AC

## 2024-06-07 NOTE — ED Triage Notes (Signed)
 Pt states she feels like a potato chip is stuck in her throat? States last time this happened she had covid. Denies any other sx's.

## 2024-06-07 NOTE — Discharge Instructions (Addendum)
 Your COVID and influenza tests are negative.  You have uvulitis which is inflammation of the uvula. The uvula is the small piece of tissue that hangs down at the back of the throat. Uvulitis causes swelling and soreness of the uvula. See handout for more information.   Stop by the pharmacy to pick up your prescriptions.  Follow up with your primary care provider or return to the urgent care, if not improving.

## 2024-06-07 NOTE — ED Provider Notes (Signed)
 [facility]    CSN: [id number] Arrival date & time: 06/07/24  1519      History   Chief Complaint Chief Complaint  Patient presents with   Covid Exposure    HPI Haley Deleon is a 68 y.o. female.   HPI  History obtained from the patient. Haley Deleon presents for sore throat with sensation like a potato chips stuck in her throat. Has similar symptoms when she had COVID. Her grand-daughter has COVID.    Denies cough, rhinorrhea, nasal congestion, vomiting, diarrhea or body aches. No medications for her symptoms.  No known seasonal allergies.         Past Medical History:  Diagnosis Date   Anemia    Atypical chest pain    Carotid arterial disease (HCC)    a. 11/2017 Carotid U/S: RICA <50, LICA nl.   Claustrophobia    Coronary artery disease    Diastolic dysfunction    a. 11/2017 Echo: EF 60-65%, no rwma, Gr1 DD. Nl RV fxn.   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, bilateral 07/09/2020   Varicose veins of leg with pain, left 06/14/2020   Dizziness 11/17/2017   Murmur, cardiac 11/17/2017   Screening for cardiovascular condition 11/17/2017   Lower extremity edema 11/17/2017   Gallstones and inflammation of gallbladder without obstruction    Incisional hernia, without obstruction or gangrene    History of biliary T-tube placement 02/24/2014   Cellulitis 02/24/2014   Cellulitis and abscess 02/24/2014   Ulcerative colitis (HCC) 08/22/2013    Past Surgical History:  Procedure Laterality Date    BREAST BIOPSY Right 11/02/2011   ultrasound guided biopsy/clip-benign   CHOLECYSTECTOMY N/A 07/29/2017   Procedure: LAPAROSCOPIC CHOLECYSTECTOMY;  Surgeon: Jordis Laneta FALCON, MD;  Location: [facility];  Service: General;  Laterality: N/A;   COLON SURGERY  2014, 2015   DUE TO ULERATIVE COLITIS WITH COLOSTOMY   COLOSTOMY TAKEDOWN     FOOT SURGERY Bilateral    BONE SPURS    HYSTEROSCOPY WITH D & C N/A 09/11/2022   Procedure: FRACTIONAL DILATATION AND CURETTAGE /HYSTEROSCOPY;  Surgeon: Schermerhorn, Debby PARAS, MD;  Location: [facility];  Service: Gynecology;  Laterality: N/A;   MANDIBLE FRACTURE SURGERY  1991   MOUTH SURGERY     VENTRAL HERNIA REPAIR N/A 07/29/2017   Procedure: LAPAROSCOPIC VENTRAL HERNIA;  Surgeon: Jordis Laneta FALCON, MD;  Location: [facility];  Service: General;  Laterality: N/A;    OB History   No obstetric history on file.      Home Medications    Prior to Admission medications   Medication Sig Start Date End Date Taking? Authorizing Provider  escitalopram  (LEXAPRO ) 10 MG tablet Take 10 mg by mouth daily.    [provider]  ferrous sulfate 325 (65 FE) MG tablet Take 325 mg by mouth daily as needed.     [provider]  hydrOXYzine (ATARAX) 10 MG tablet Take 10 mg by mouth 3 (three) times daily as needed for anxiety.  [provider]  loperamide  (IMODIUM ) 2 MG capsule Take 2 mg as needed by mouth for diarrhea or loose stools (NORMALLY TAKES 2-3 TIMES DAILY SINCE HAVING HER COLON SURGERY).     [provider]  Multiple Vitamin (MULTIVITAMIN) tablet Take 1 tablet by mouth daily.    [provider]    Family History Family History  Problem Relation Age of Onset   Heart attack Mother 10   Hyperlipidemia Brother    Breast cancer Neg Hx     Social History Social History   Tobacco Use   Smoking status: Former    Current packs/day: 0.00    Average packs/day: 1 pack/day for 20.0 years (20.0 ttl pk-yrs)    Types: Cigarettes     Start date: 07/28/1985    Quit date: 07/28/2005    Years since quitting: 18.8   Smokeless tobacco: Never  Vaping Use   Vaping status: Never Used  Substance Use Topics   Alcohol use: Yes    Comment: WINE   Drug use: No     Allergies   Caffeine, Chocolate, Chocolate flavoring agent (non-screening), Cocoa, and Meloxicam   Review of Systems Review of Systems: negative unless otherwise stated in HPI.      Physical Exam Triage Vital Signs ED Triage Vitals  Encounter Vitals Group     BP 06/07/24 1611 (!) 135/95     Girls Systolic BP Percentile --      Girls Diastolic BP Percentile --      Boys Systolic BP Percentile --      Boys Diastolic BP Percentile --      Pulse Rate 06/07/24 1611 80     Resp 06/07/24 1611 16     Temp 06/07/24 1611 98.2 F (36.8 C)     Temp Source 06/07/24 1611 Oral     SpO2 06/07/24 1611 96 %     Weight 06/07/24 1610 270 lb (122.5 kg)     Height --      Head Circumference --      Peak Flow --      Pain Score 06/07/24 1617 0     Pain Loc --      Pain Education --      Exclude from Growth Chart --    No data found.  Updated Vital Signs BP (!) 135/95 (BP Location: Left Wrist)   Pulse 80   Temp 98.2 F (36.8 C) (Oral)   Resp 16   Wt 122.5 kg   SpO2 96%   BMI 41.66 kg/m   Visual Acuity Right Eye Distance:   Left Eye Distance:   Bilateral Distance:    Right Eye Near:   Left Eye Near:    Bilateral Near:     Physical Exam GEN:     alert, non-toxic appearing female in no distress ***   HENT:  mucus membranes moist, oropharyngeal ***without lesions or ***erythema, no*** tonsillar hypertrophy or exudates, *** moderate erythematous edematous turbinates, ***clear nasal discharge, ***bilateral TM normal EYES:   pupils equal and reactive, ***no scleral injection or discharge NECK:  normal ROM, no ***lymphadenopathy, ***no meningismus   RESP:  no increased work of breathing, ***clear to auscultation bilaterally CVS:   regular rate ***and  rhythm Skin:   warm and dry, no rash on visible skin***    UC Treatments / Results  Labs (all labs ordered are listed, but only abnormal results are displayed) Labs Reviewed  RESP PANEL BY RT-PCR (FLU A&B, COVID) [id number]  EKG   Radiology No results found.   Procedures Procedures (including critical care time)  Medications Ordered in UC Medications - No data to display  Initial Impression / Assessment and Plan / UC Course  I have reviewed the triage vital signs and the nursing notes.  Pertinent labs & imaging results that were available during my care of the patient were reviewed by me and considered in my medical decision making (see chart for details).       Pt is a 68 y.o. female who presents for *** days of respiratory symptoms. Lidia is ***afebrile here without recent antipyretics. Satting well on room air. Overall pt is ***non-toxic appearing, well hydrated, without respiratory distress. Pulmonary exam ***is unremarkable.  COVID and influenza panel obtained ***and was negative. ***Pt to quarantine until COVID test results or longer if positive.  I will call patient with test results, if positive. History consistent with ***viral respiratory illness. Discussed symptomatic treatment.  Explained lack of efficacy of antibiotics in viral disease.  Typical duration of symptoms discussed.   Return and ED precautions given and voiced understanding. Discussed MDM, treatment plan and plan for follow-up with patient*** who agrees with plan.     Final Clinical Impressions(s) / UC Diagnoses   Final diagnoses:  None   Discharge Instructions   None    ED Prescriptions   None    PDMP not reviewed this encounter.

## 2024-06-08 ENCOUNTER — Ambulatory Visit: Admitting: Physician Assistant

## 2024-08-03 ENCOUNTER — Other Ambulatory Visit: Payer: Self-pay | Admitting: Family Medicine

## 2024-08-03 DIAGNOSIS — Z1231 Encounter for screening mammogram for malignant neoplasm of breast: Secondary | ICD-10-CM

## 2024-08-15 ENCOUNTER — Emergency Department

## 2024-08-15 ENCOUNTER — Inpatient Hospital Stay
Admission: EM | Admit: 2024-08-15 | Discharge: 2024-09-21 | Disposition: E | Source: Home / Self Care | Attending: Osteopathic Medicine | Admitting: Osteopathic Medicine

## 2024-08-15 ENCOUNTER — Other Ambulatory Visit: Payer: Self-pay

## 2024-08-15 ENCOUNTER — Encounter: Payer: Self-pay | Admitting: *Deleted

## 2024-08-15 DIAGNOSIS — R4182 Altered mental status, unspecified: Secondary | ICD-10-CM | POA: Diagnosis not present

## 2024-08-15 DIAGNOSIS — F10930 Alcohol use, unspecified with withdrawal, uncomplicated: Secondary | ICD-10-CM | POA: Diagnosis not present

## 2024-08-15 DIAGNOSIS — J690 Pneumonitis due to inhalation of food and vomit: Secondary | ICD-10-CM | POA: Diagnosis not present

## 2024-08-15 DIAGNOSIS — N390 Urinary tract infection, site not specified: Secondary | ICD-10-CM | POA: Diagnosis present

## 2024-08-15 DIAGNOSIS — L89322 Pressure ulcer of left buttock, stage 2: Secondary | ICD-10-CM | POA: Diagnosis not present

## 2024-08-15 DIAGNOSIS — E870 Hyperosmolality and hypernatremia: Secondary | ICD-10-CM | POA: Diagnosis present

## 2024-08-15 DIAGNOSIS — J9692 Respiratory failure, unspecified with hypercapnia: Secondary | ICD-10-CM | POA: Diagnosis not present

## 2024-08-15 DIAGNOSIS — R6521 Severe sepsis with septic shock: Secondary | ICD-10-CM | POA: Diagnosis not present

## 2024-08-15 DIAGNOSIS — Z87891 Personal history of nicotine dependence: Secondary | ICD-10-CM | POA: Diagnosis not present

## 2024-08-15 DIAGNOSIS — R509 Fever, unspecified: Secondary | ICD-10-CM | POA: Diagnosis not present

## 2024-08-15 DIAGNOSIS — Z66 Do not resuscitate: Secondary | ICD-10-CM | POA: Diagnosis present

## 2024-08-15 DIAGNOSIS — M7989 Other specified soft tissue disorders: Secondary | ICD-10-CM | POA: Diagnosis present

## 2024-08-15 DIAGNOSIS — J9622 Acute and chronic respiratory failure with hypercapnia: Secondary | ICD-10-CM | POA: Diagnosis not present

## 2024-08-15 DIAGNOSIS — J9691 Respiratory failure, unspecified with hypoxia: Secondary | ICD-10-CM | POA: Diagnosis not present

## 2024-08-15 DIAGNOSIS — M6282 Rhabdomyolysis: Secondary | ICD-10-CM | POA: Diagnosis present

## 2024-08-15 DIAGNOSIS — I5032 Chronic diastolic (congestive) heart failure: Secondary | ICD-10-CM | POA: Diagnosis present

## 2024-08-15 DIAGNOSIS — F32A Depression, unspecified: Secondary | ICD-10-CM | POA: Diagnosis present

## 2024-08-15 DIAGNOSIS — E66813 Obesity, class 3: Secondary | ICD-10-CM | POA: Diagnosis present

## 2024-08-15 DIAGNOSIS — Z7189 Other specified counseling: Secondary | ICD-10-CM | POA: Diagnosis not present

## 2024-08-15 DIAGNOSIS — G928 Other toxic encephalopathy: Secondary | ICD-10-CM | POA: Diagnosis present

## 2024-08-15 DIAGNOSIS — K5190 Ulcerative colitis, unspecified, without complications: Secondary | ICD-10-CM | POA: Diagnosis present

## 2024-08-15 DIAGNOSIS — N136 Pyonephrosis: Secondary | ICD-10-CM | POA: Diagnosis present

## 2024-08-15 DIAGNOSIS — Z930 Tracheostomy status: Secondary | ICD-10-CM | POA: Diagnosis not present

## 2024-08-15 DIAGNOSIS — J122 Parainfluenza virus pneumonia: Secondary | ICD-10-CM | POA: Diagnosis not present

## 2024-08-15 DIAGNOSIS — L089 Local infection of the skin and subcutaneous tissue, unspecified: Secondary | ICD-10-CM | POA: Diagnosis not present

## 2024-08-15 DIAGNOSIS — B372 Candidiasis of skin and nail: Secondary | ICD-10-CM | POA: Diagnosis not present

## 2024-08-15 DIAGNOSIS — K5180 Other ulcerative colitis without complications: Secondary | ICD-10-CM | POA: Diagnosis present

## 2024-08-15 DIAGNOSIS — L89312 Pressure ulcer of right buttock, stage 2: Secondary | ICD-10-CM | POA: Diagnosis not present

## 2024-08-15 DIAGNOSIS — F10931 Alcohol use, unspecified with withdrawal delirium: Principal | ICD-10-CM

## 2024-08-15 DIAGNOSIS — R569 Unspecified convulsions: Secondary | ICD-10-CM | POA: Diagnosis not present

## 2024-08-15 DIAGNOSIS — F10139 Alcohol abuse with withdrawal, unspecified: Secondary | ICD-10-CM | POA: Diagnosis not present

## 2024-08-15 DIAGNOSIS — N179 Acute kidney failure, unspecified: Secondary | ICD-10-CM | POA: Diagnosis not present

## 2024-08-15 DIAGNOSIS — F10231 Alcohol dependence with withdrawal delirium: Secondary | ICD-10-CM | POA: Diagnosis present

## 2024-08-15 DIAGNOSIS — E872 Acidosis, unspecified: Secondary | ICD-10-CM | POA: Diagnosis not present

## 2024-08-15 DIAGNOSIS — G9341 Metabolic encephalopathy: Secondary | ICD-10-CM | POA: Diagnosis present

## 2024-08-15 DIAGNOSIS — M79662 Pain in left lower leg: Secondary | ICD-10-CM | POA: Diagnosis not present

## 2024-08-15 DIAGNOSIS — F10939 Alcohol use, unspecified with withdrawal, unspecified: Secondary | ICD-10-CM | POA: Diagnosis present

## 2024-08-15 DIAGNOSIS — Z6841 Body Mass Index (BMI) 40.0 and over, adult: Secondary | ICD-10-CM | POA: Diagnosis not present

## 2024-08-15 DIAGNOSIS — B379 Candidiasis, unspecified: Secondary | ICD-10-CM

## 2024-08-15 DIAGNOSIS — J9621 Acute and chronic respiratory failure with hypoxia: Secondary | ICD-10-CM | POA: Diagnosis not present

## 2024-08-15 DIAGNOSIS — B9562 Methicillin resistant Staphylococcus aureus infection as the cause of diseases classified elsewhere: Secondary | ICD-10-CM | POA: Diagnosis not present

## 2024-08-15 DIAGNOSIS — B965 Pseudomonas (aeruginosa) (mallei) (pseudomallei) as the cause of diseases classified elsewhere: Secondary | ICD-10-CM | POA: Diagnosis present

## 2024-08-15 DIAGNOSIS — F418 Other specified anxiety disorders: Secondary | ICD-10-CM | POA: Diagnosis not present

## 2024-08-15 DIAGNOSIS — Z711 Person with feared health complaint in whom no diagnosis is made: Secondary | ICD-10-CM | POA: Diagnosis not present

## 2024-08-15 DIAGNOSIS — J9601 Acute respiratory failure with hypoxia: Secondary | ICD-10-CM | POA: Diagnosis not present

## 2024-08-15 DIAGNOSIS — A419 Sepsis, unspecified organism: Secondary | ICD-10-CM | POA: Diagnosis not present

## 2024-08-15 DIAGNOSIS — B9620 Unspecified Escherichia coli [E. coli] as the cause of diseases classified elsewhere: Secondary | ICD-10-CM | POA: Diagnosis present

## 2024-08-15 DIAGNOSIS — G039 Meningitis, unspecified: Secondary | ICD-10-CM | POA: Diagnosis not present

## 2024-08-15 DIAGNOSIS — B964 Proteus (mirabilis) (morganii) as the cause of diseases classified elsewhere: Secondary | ICD-10-CM | POA: Diagnosis present

## 2024-08-15 DIAGNOSIS — Z515 Encounter for palliative care: Secondary | ICD-10-CM | POA: Diagnosis not present

## 2024-08-15 HISTORY — DX: Alcohol use, unspecified, uncomplicated: F10.90

## 2024-08-15 LAB — BASIC METABOLIC PANEL WITH GFR
Anion gap: 13 (ref 5–15)
BUN: 15 mg/dL (ref 8–23)
CO2: 22 mmol/L (ref 22–32)
Calcium: 9.3 mg/dL (ref 8.9–10.3)
Chloride: 105 mmol/L (ref 98–111)
Creatinine, Ser: 0.97 mg/dL (ref 0.44–1.00)
GFR, Estimated: >60 mL/min (ref >60)
Glucose, Bld: 151 mg/dL — ABNORMAL HIGH (ref 70–99)
Potassium: 4 mmol/L (ref 3.5–5.1)
Sodium: 140 mmol/L (ref 135–145)

## 2024-08-15 LAB — CBC
HCT: 43.5 % (ref 36.0–46.0)
Hemoglobin: 14.2 g/dL (ref 12.0–15.0)
MCH: 30 pg (ref 26.0–34.0)
MCHC: 32.6 g/dL (ref 30.0–36.0)
MCV: 91.8 fL (ref 80.0–100.0)
Platelets: 248 K/uL (ref 150–400)
RBC: 4.74 MIL/uL (ref 3.87–5.11)
RDW: 13.5 % (ref 11.5–15.5)
WBC: 10.1 K/uL (ref 4.0–10.5)
nRBC: 0 % (ref 0.0–0.2)

## 2024-08-15 LAB — URINE DRUG SCREEN
Amphetamines: NEGATIVE
Barbiturates: NEGATIVE
Benzodiazepines: NEGATIVE
Cocaine: NEGATIVE
Fentanyl: NEGATIVE
Methadone Scn, Ur: NEGATIVE
Opiates: NEGATIVE
Tetrahydrocannabinol: NEGATIVE

## 2024-08-15 LAB — AMMONIA: Ammonia: 19 umol/L (ref 9–35)

## 2024-08-15 MED ORDER — LORAZEPAM 2 MG/ML IJ SOLN
0.0000 mg | Freq: Four times a day (QID) | INTRAMUSCULAR | Status: AC
  Administered 2024-08-16 (×2): 2 mg via INTRAVENOUS
  Administered 2024-08-16: 1 mg via INTRAVENOUS
  Administered 2024-08-16: 2 mg via INTRAVENOUS
  Administered 2024-08-16: 1 mg via INTRAVENOUS
  Administered 2024-08-17: 2 mg via INTRAVENOUS
  Filled 2024-08-15 (×5): qty 1

## 2024-08-15 MED ORDER — FOLIC ACID 1 MG PO TABS
1.0000 mg | ORAL_TABLET | Freq: Every day | ORAL | Status: DC
  Administered 2024-08-16 – 2024-08-27 (×11): 1 mg via ORAL
  Filled 2024-08-15 (×12): qty 1

## 2024-08-15 MED ORDER — NYSTATIN 100000 UNIT/GM EX POWD
Freq: Once | CUTANEOUS | Status: AC
  Filled 2024-08-15: qty 15

## 2024-08-15 MED ORDER — HYDRALAZINE HCL 20 MG/ML IJ SOLN: 5.0000 mg | INTRAMUSCULAR | Status: DC | PRN

## 2024-08-15 MED ORDER — LORAZEPAM 1 MG PO TABS: 1.0000 mg | ORAL_TABLET | Freq: Once | ORAL | Status: DC

## 2024-08-15 MED ORDER — ADULT MULTIVITAMIN W/MINERALS CH
1.0000 | ORAL_TABLET | Freq: Every day | ORAL | Status: DC
  Administered 2024-08-16 – 2024-08-27 (×11): 1 via ORAL
  Filled 2024-08-15 (×12): qty 1

## 2024-08-15 MED ORDER — LORAZEPAM 2 MG/ML IJ SOLN
0.0000 mg | Freq: Two times a day (BID) | INTRAMUSCULAR | Status: AC
  Administered 2024-08-18: 1 mg via INTRAVENOUS
  Filled 2024-08-15: qty 1

## 2024-08-15 MED ORDER — ONDANSETRON HCL 4 MG/2ML IJ SOLN: 4.0000 mg | Freq: Three times a day (TID) | INTRAMUSCULAR | Status: DC | PRN

## 2024-08-15 MED ORDER — LORAZEPAM 1 MG PO TABS: 1.0000 mg | ORAL_TABLET | ORAL | Status: AC | PRN

## 2024-08-15 MED ORDER — THIAMINE MONONITRATE 100 MG PO TABS
100.0000 mg | ORAL_TABLET | Freq: Every day | ORAL | Status: DC
  Administered 2024-08-19 – 2024-08-27 (×8): 100 mg via ORAL
  Filled 2024-08-15 (×9): qty 1

## 2024-08-15 MED ORDER — ACETAMINOPHEN 325 MG PO TABS
650.0000 mg | ORAL_TABLET | Freq: Four times a day (QID) | ORAL | Status: DC | PRN
  Administered 2024-08-18 – 2024-08-27 (×9): 650 mg via ORAL
  Filled 2024-08-15 (×9): qty 2

## 2024-08-15 MED ORDER — ENOXAPARIN SODIUM 60 MG/0.6ML IJ SOSY
60.0000 mg | PREFILLED_SYRINGE | INTRAMUSCULAR | Status: DC
  Administered 2024-08-16 – 2024-08-31 (×16): 60 mg via SUBCUTANEOUS
  Filled 2024-08-15 (×16): qty 0.6

## 2024-08-15 MED ORDER — THIAMINE HCL 100 MG/ML IJ SOLN
100.0000 mg | Freq: Every day | INTRAMUSCULAR | Status: DC
  Administered 2024-08-16 – 2024-08-30 (×7): 100 mg via INTRAVENOUS
  Filled 2024-08-15 (×8): qty 2

## 2024-08-15 MED ORDER — PHENOBARBITAL SODIUM 130 MG/ML IJ SOLN
130.0000 mg | Freq: Once | INTRAMUSCULAR | Status: AC
  Administered 2024-08-15: 130 mg via INTRAVENOUS
  Filled 2024-08-15: qty 1

## 2024-08-15 MED ORDER — LORAZEPAM 2 MG/ML IJ SOLN
1.0000 mg | INTRAMUSCULAR | Status: AC | PRN
  Administered 2024-08-17: 2 mg via INTRAVENOUS
  Filled 2024-08-15 (×2): qty 1

## 2024-08-15 NOTE — ED Provider Notes (Signed)
 [facility] Provider Note    Event Date/Time   First MD Initiated Contact with Patient 08/15/24 1845     (approximate)   History   Leg Swelling   HPI  Haley Deleon is a 68 y.o. female history of ulcerative colitis, CAD, presenting with left lower extremity swelling.  States been ongoing for several days.  Also noticed a rash to her groin.  Nontender.  States that she has been spraying some clean spray on it.  Denies history of malignancy or DVTs.  No new focal weakness or numbness.  No back pain.  No fever.  No chest pain or shortness of breath.  States that she bumped her leg against the side of a table and has bruising to her left shin.  On independent chart review, she was seen at urgent care in August for left lower extremity edema and possible insect bite to her left shin.  At time she was treated for cellulitis with Keflex .     Physical Exam   Triage Vital Signs: ED Triage Vitals [08/15/24 1709]  Encounter Vitals Group     BP 130/86     Girls Systolic BP Percentile      Girls Diastolic BP Percentile      Boys Systolic BP Percentile      Boys Diastolic BP Percentile      Pulse Rate 95     Resp 18     Temp 98 F (36.7 C)     Temp Source Oral     SpO2 98 %     Weight 265 lb (120.2 kg)     Height 5' 7 (1.702 m)     Head Circumference      Peak Flow      Pain Score 10     Pain Loc      Pain Education      Exclude from Growth Chart     Most recent vital signs: Vitals:   08/15/24 2146 08/15/24 2254  BP: (!) 159/99 (!) 159/99  Pulse: (!) 107 (!) 107  Resp: 17   Temp: 98.6 F (37 C)   SpO2: 100%      General: Awake, no distress.  CV:  Good peripheral perfusion.  Resp:  Normal effort.  Abd:  No distention.  Soft nontender Other:  She does have bilateral lower extremity edema, left leg up to her knee is slightly larger than the right, does look venous stasis changes, does not appear erythematous, no induration or tenderness on  palpation.  Does have bruising to her left shin without bony tenderness.  Palpable DP pulses bilaterally, sensation is intact, able to plantarflex without weakness.  She does have an erythematous rash to her inguinal region without any tenderness, no pain at proportion, no crepitus.     ED Results / Procedures / Treatments   Labs (all labs ordered are listed, but only abnormal results are displayed) Labs Reviewed  BASIC METABOLIC PANEL WITH GFR - Abnormal; Notable for the following components:      Result Value   Glucose, Bld 151 (*)    All other components within normal limits  CBC  AMMONIA  URINALYSIS, W/ REFLEX TO CULTURE (INFECTION SUSPECTED)  URINE DRUG SCREEN  ETHANOL      RADIOLOGY On my independent interpretation, CT head without obvious intracranial hemorrhage   PROCEDURES:  Critical Care performed: Yes, see critical care procedure note(s)  .Critical Care  Performed by: Waymond Lorelle Cummins, MD Authorized  by: Waymond Lorelle Cummins, MD   Critical care provider statement:    Critical care time (minutes):  40   Critical care was time spent personally by me on the following activities:  Development of treatment plan with patient or surrogate, discussions with consultants, evaluation of patient's response to treatment, examination of patient, ordering and review of laboratory studies, ordering and review of radiographic studies, ordering and performing treatments and interventions, pulse oximetry, re-evaluation of patient's condition and review of old charts    MEDICATIONS ORDERED IN ED: Medications  nystatin  (MYCOSTATIN /NYSTOP ) topical powder ( Topical Given 08/15/24 2142)  PHENObarbital  (LUMINAL) injection 130 mg (130 mg Intravenous Given 08/15/24 2307)     IMPRESSION / MDM / ASSESSMENT AND PLAN / ED COURSE  I reviewed the triage vital signs and the nursing notes.                              Differential diagnosis includes, but is not limited to, for her leg swelling, did  consider venous stasis, lymphedema, DVT, she is no bony tenderness at this time suggest fracture.  Patient is ambulatory and weightbearing.  For the rash to her groin, appears fungal, no crepitus, no tenderness or pain or proportion on exam.  Doubt Fournier's at this time.  Labs were obtained at triage, will add a DVT ultrasound.  Order for nystatin .  [name] presentation is most consistent with acute presentation with potential threat to life or bodily function.  Independent interpretation of labs and imaging below.  Clinical course as below.  She will need to be admitted for mental status change as well as alcohol withdrawal.  Was given a dose of phenobarbital  here.  Consult to hospitalist will admit the patient.  She is admitted.    Clinical Course as of 08/15/24 2341  Tue Aug 15, 2024  1918 Independent review of labs, electrolytes all severely deranged, no leukocytosis. [TT]  2159 US  Venous Img Lower  Left (DVT Study) 1. No evidence of DVT in the left lower extremity.  [TT]  2214 On reassessment patient states that she is feeling better, wants to leave.  But is unable to tell me where she is or what year it is, able to state her name and the month.  States that she is going to drive herself home.  Appears that she is not altered, she is moving all 4 extremity without focal weakness or numbness, no facial droop or slurred speech.  Get a CT head, ammonia level, UA.  Likely will need to be admitted. [TT]  2215 Tried to reach out to granddaughter who is her emergency contact but no reply.  Left a voice message for her to call back. [TT]  2242 Able to talk to granddaughter, she says that her grandmother was lucid earlier when she was talking to her, this is a mental status change.  Does have history of anxiety and depression but no history of psychosis.  States that if her grandmother is unable to make medical decisions for self, she is able to make medical decisions for her and is agreeable with  plan for admission and additional workup.  Does not drink daily, no evidence of drug use.  Daughter states that she saw her at 4 PM and she appeared herself. [TT]  2250 Discussed with patient, she states that she occasionally gets cocktails.  States that last drink was several days ago.  She did have some tongue fasciculation and  hand tremors bilaterally, wonder if she is withdrawing from alcohol.  Will give her some phenobarbital  here and plan her have admitted for alcohol withdrawal. [TT]  2315 Ammonia: 19 Not elevated [TT]  2333 CT Head Wo Contrast 1. No acute intracranial abnormality.  [TT]    Clinical Course User Index [TT] Waymond Lorelle Cummins, MD     FINAL CLINICAL IMPRESSION(S) / ED DIAGNOSES   Final diagnoses:  Leg swelling  Yeast infection  Alcohol withdrawal syndrome, with delirium (HCC)  Altered mental status, unspecified altered mental status type     Rx / DC Orders   ED Discharge Orders     None        Note:  This document was prepared using Dragon voice recognition software and may include unintentional dictation errors.    Waymond Lorelle Cummins, MD 08/15/24 [phone]

## 2024-08-15 NOTE — ED Triage Notes (Signed)
 Pt to triage via wheelchair.  Pt has swelling to left leg and pain in left leg.  No known injury to leg.  Redness noted to left lower leg.  Non smoker.  No chest pain or sob.  Pt alert  speech clear.

## 2024-08-15 NOTE — ED Notes (Signed)
 Pt assisted to toilet in room per request, pt unsteady on her feet, assisted x2. Falls risk bundle in place.

## 2024-08-15 NOTE — ED Notes (Signed)
 Dr. Waymond made aware of pt's new onset confusion

## 2024-08-15 NOTE — ED Notes (Signed)
 Pt asking multiple times when she will be roomed. This RN explaining the wait time is unknown and pt will be seen once bed is available. Pt taken to restroom by staff.

## 2024-08-15 NOTE — H&P (Signed)
 History and Physical    INGRI Deleon FMW:[id number] DOB: 1956-05-14 DOA: 08/15/2024  Referring MD/NP/PA:   PCP: Ricard Tawni KIDD, MD   Patient coming from:  The patient is coming from home.     Chief Complaint: Left leg swelling and pain, confusion  HPI: Haley Deleon is a 68 y.o. female with medical history significant of alcohol use disorder, dCHF, CAD, depression with anxiety, morbid obesity, ulcerative colitis, NSVT, PSVT, varicose vein in both legs, carotid artery stenosis, morbid obesity, who presents with left leg swelling and pain, confusion.  Per ED physician, patient initially presented to ED with left leg swelling and pain, and then she developed confusion in ED.  When I saw patient in ED, she is alert, orientated to place and person, not to the time.  She moves all extremities normally.  No facial droop or slurred speech.  She has bilateral lower leg swelling, the left leg is much worse than the right.  She does not have pain in the right leg.  She denies fall or injury.  No fever or chills.  She has a varicose vein in both legs.  Patient denies chest pain, cough, SOB.  No nausea, vomiting, diarrhea or abdominal pain.  No symptoms UTI.  Patient has candidiasis in groin areas. Per her granddaughter at the bedside, patient used to drink liquor almost every day. Recently she drinks less, and last drinking was several days ago.  Patient states that she only drinks once a week recently.  Data reviewed independently and ED Course: pt was found to have WBC 10.1, GFR> 60, UA (cloudy appearance, small amount of leukocyte, rare bacteria, WBC  21-50), ammonia 19, GFR> 60, negative UDS.  Temperature normal, blood pressure 159/99, heart rate of 107, RR 17, oxygen saturation 100% on room air.  CT of head negative for acute intracranial abnormalities.  Left lower extremity venous Doppler negative for DVT.  Patient is admitted to PCU as inpatient.  EKG: I have personally reviewed.  Sinus  rhythm, QTc 443, early R wave progression, T wave inversion in lead III/aVF.   Review of Systems:   General: no fevers, chills, no body weight gain, has fatigue HEENT: no blurry vision, hearing changes or sore throat Respiratory: no dyspnea, coughing, wheezing CV: no chest pain, no palpitations GI: no nausea, vomiting, abdominal pain, diarrhea, constipation GU: no dysuria, burning on urination, increased urinary frequency, hematuria  Ext: has leg edema and left leg pain Neuro: no unilateral weakness, numbness, or tingling, no vision change or hearing loss Skin: no rash, no skin tear. MSK: No muscle spasm, no deformity, no limitation of range of movement in spin Heme: No easy bruising.  Travel history: No recent long distant travel.   Allergy:  Allergies  Allergen Reactions   Caffeine Anaphylaxis and Other (See Comments)   Chocolate Anaphylaxis   Chocolate Flavoring Agent (Non-Screening) Anaphylaxis   Cocoa Anaphylaxis   Meloxicam Other (See Comments)    migraine and bleeding  Severe headache  meloxicam    Past Medical History:  Diagnosis Date   Alcohol use disorder    Anemia    Atypical chest pain    Carotid arterial disease    a. 11/2017 Carotid U/S: RICA <50, LICA nl.   Claustrophobia    Coronary artery disease    Diastolic dysfunction    a. 11/2017 Echo: EF 60-65%, no rwma, Gr1 DD. Nl RV fxn.   Dysrhythmia    Family history of adverse reaction to anesthesia  a.) early emergence in 3rd degree relative (1st cousin)   Heart murmur    Liver cyst    Pneumonia    Ulcerative colitis (HCC)    Venous insufficiency     Past Surgical History:  Procedure Laterality Date   BREAST BIOPSY Right 11/02/2011   ultrasound guided biopsy/clip-benign   CHOLECYSTECTOMY N/A 07/29/2017   Procedure: LAPAROSCOPIC CHOLECYSTECTOMY;  Surgeon: Jordis Laneta FALCON, MD;  Location: [facility];  Service: General;  Laterality: N/A;   COLON SURGERY  2014, 2015   DUE TO ULERATIVE COLITIS WITH  COLOSTOMY   COLOSTOMY TAKEDOWN     FOOT SURGERY Bilateral    BONE SPURS    HYSTEROSCOPY WITH D & C N/A 09/11/2022   Procedure: FRACTIONAL DILATATION AND CURETTAGE /HYSTEROSCOPY;  Surgeon: Schermerhorn, Debby PARAS, MD;  Location: [facility];  Service: Gynecology;  Laterality: N/A;   MANDIBLE FRACTURE SURGERY  1991   MOUTH SURGERY     VENTRAL HERNIA REPAIR N/A 07/29/2017   Procedure: LAPAROSCOPIC VENTRAL HERNIA;  Surgeon: Jordis Laneta FALCON, MD;  Location: [facility];  Service: General;  Laterality: N/A;    Social History:  reports that she quit smoking about 19 years ago. Her smoking use included cigarettes. She started smoking about 39 years ago. She has a 20 pack-year smoking history. She has never used smokeless tobacco. She reports current alcohol use. She reports that she does not use drugs.  Family History:  Family History  Problem Relation Age of Onset   Heart attack Mother 74   Hyperlipidemia Brother    Breast cancer Neg Hx      Prior to Admission medications   Medication Sig Start Date End Date Taking? Authorizing Provider  escitalopram  (LEXAPRO ) 10 MG tablet Take 10 mg by mouth daily.    [provider]  ferrous sulfate 325 (65 FE) MG tablet Take 325 mg by mouth daily as needed.     [provider]  hydrOXYzine  (ATARAX ) 10 MG tablet Take 10 mg by mouth 3 (three) times daily as needed for anxiety.    [provider]  loperamide  (IMODIUM ) 2 MG capsule Take 2 mg as needed by mouth for diarrhea or loose stools (NORMALLY TAKES 2-3 TIMES DAILY SINCE HAVING HER COLON SURGERY).     [provider]  Multiple Vitamin (MULTIVITAMIN) tablet Take 1 tablet by mouth daily.    [provider]  predniSONE  (STERAPRED UNI-PAK 21 TAB) 10 MG (21) TBPK tablet Take by mouth daily. Take 6 tabs by mouth daily for 1, then 5 tabs for 1 day, then 4 tabs for 1 day, then 3 tabs for 1 day, then 2 tabs for 1 day, then 1 tab for 1 day. 06/07/24   Haley Berth, DO     Physical Exam: Vitals:   08/15/24 1709 08/15/24 2146 08/15/24 2254 08/16/24 0015  BP: 130/86 (!) 159/99 (!) 159/99   Pulse: 95 (!) 107 (!) 107 95  Resp: 18 17    Temp: 98 F (36.7 C) 98.6 F (37 C)    TempSrc: Oral Oral    SpO2: 98% 100%    Weight: 120.2 kg     Height: 5' 7 (1.702 m)      General: Not in acute distress. Pt is mildly tremulous HEENT:       Eyes: PERRL, EOMI, no jaundice       ENT: No discharge from the ears and nose, no pharynx injection, no tonsillar enlargement.        Neck: No JVD,  no bruit, no mass felt. Heme: No neck lymph node enlargement. Cardiac: S1/S2, RRR, No gallops or rubs. Respiratory: No rales, wheezing, rhonchi or rubs. GI: Soft, nondistended, nontender, no rebound pain, no organomegaly, BS present. GU: No hematuria Ext: 1+DP/PT pulse bilaterally. Has bilateral lower leg edema, the left leg is worse than the right.  Has bruises over left lower leg.  Has chronic venous insufficiency in both legs.  Has tenderness in left lower leg, but no warmth.     Musculoskeletal: No joint deformities, No joint redness or warmth, no limitation of ROM in spin. Skin: No rashes.  Neuro: Alert, oriented to person and place, not to the time.  Cranial nerves II-XII grossly intact, moves all extremities normally.  Psych: Patient is not psychotic, no suicidal or hemocidal ideation.  Labs on Admission: I have personally reviewed following labs and imaging studies  CBC: Recent Labs  Lab 08/15/24 1711  WBC 10.1  HGB 14.2  HCT 43.5  MCV 91.8  PLT 248   Basic Metabolic Panel: Recent Labs  Lab 08/15/24 1711  NA 140  K 4.0  CL 105  CO2 22  GLUCOSE 151*  BUN 15  CREATININE 0.97  CALCIUM 9.3   GFR: Estimated Creatinine Clearance: 75.5 mL/min (by C-G formula based on SCr of 0.97 mg/dL). Liver Function Tests: No results for input(s): AST, ALT, ALKPHOS, BILITOT, PROT, ALBUMIN in the last 168 hours. No results for input(s): LIPASE,  AMYLASE in the last 168 hours. Recent Labs  Lab 08/15/24 2238  AMMONIA 19   Coagulation Profile: No results for input(s): INR, PROTIME in the last 168 hours. Cardiac Enzymes: No results for input(s): CKTOTAL, CKMB, CKMBINDEX, TROPONINI in the last 168 hours. BNP (last 3 results) No results for input(s): PROBNP in the last 8760 hours. HbA1C: No results for input(s): HGBA1C in the last 72 hours. CBG: No results for input(s): GLUCAP in the last 168 hours. Lipid Profile: No results for input(s): CHOL, HDL, LDLCALC, TRIG, CHOLHDL, LDLDIRECT in the last 72 hours. Thyroid Function Tests: No results for input(s): TSH, T4TOTAL, FREET4, T3FREE, THYROIDAB in the last 72 hours. Anemia Panel: No results for input(s): VITAMINB12, FOLATE, FERRITIN, TIBC, IRON, RETICCTPCT in the last 72 hours. Urine analysis:    Component Value Date/Time   COLORURINE YELLOW (A) 08/15/2024 2318   APPEARANCEUR CLOUDY (A) 08/15/2024 2318   APPEARANCEUR Clear 02/23/2014 2251   LABSPEC 1.019 08/15/2024 2318   LABSPEC 1.016 02/23/2014 2251   PHURINE 5.0 08/15/2024 2318   GLUCOSEU NEGATIVE 08/15/2024 2318   GLUCOSEU Negative 02/23/2014 2251   HGBUR NEGATIVE 08/15/2024 2318   BILIRUBINUR NEGATIVE 08/15/2024 2318   BILIRUBINUR Negative 02/23/2014 2251   KETONESUR NEGATIVE 08/15/2024 2318   PROTEINUR NEGATIVE 08/15/2024 2318   NITRITE NEGATIVE 08/15/2024 2318   LEUKOCYTESUR SMALL (A) 08/15/2024 2318   LEUKOCYTESUR Trace 02/23/2014 2251   Sepsis Labs: @LABRCNTIP (procalcitonin:4,lacticidven:4) )No results found for this or any previous visit (from the past 240 hours).   Radiological Exams on Admission:   Assessment/Plan Principal Problem:   Acute metabolic encephalopathy Active Problems:   UTI (urinary tract infection)   Alcohol withdrawal (HCC)   Pain and swelling of left lower leg   Chronic diastolic CHF (congestive heart failure) (HCC)   Ulcerative  colitis (HCC)   Intertriginous candidiasis   Depression   Obesity, Class III, BMI 40-49.9 (morbid obesity) (HCC)   Assessment and Plan:  Acute metabolic encephalopathy: Etiology is not clear. CT head negative.  UDS negative. No focal neurodeficit on physical  examination. Potential differential diagnosis include UTI and alcohol withdrawal.   -Admitted to PCU as inpatient - Frequent neurochecks - Fall precaution - Rocephin  for UTI - CIWA protocol for possible alcohol withdrawal  UTI: - IV Rocephin  - Follow-up urine culture  Hx of alcohol use disorder and possible alcohol withdrawal [facility]): Patient has confusion, and is mildly tremulous, indicating possible alcohol withdrawal with delirium.  Pending alcohol level. - Patient received 130 mg of phenobarbital  in ED - Start CIWA protocol - Fall precaution - Frequent neurocheck  - Did counseling about importance of quitting alcohol use - check Mg and phosphorus level  Pain and swelling of left lower leg: Etiology is not clear.  Patient denies fall injury.  Venous Doppler negative for DVT left leg.  Potential differential diagnosis is chronic venous insufficiency.  Patient also has varicose vein which may have contributed partially. - As needed Tylenol  and oxycodone  for pain - Follow-up CK - Follow-up x-ray of left tibia/fibula to rule out any injury  Chronic diastolic CHF (congestive heart failure) (HCC): 2D echo on 02/24/2021 showed EF 60 to 65%.  Patient has leg edema, but no SOB.  Does not seem to have CHF exacerbation. - Check BNP  Ulcerative colitis (HCC): No active nausea vomiting or diarrhea.  No abdominal pain. -Continue home as needed Imodium   Intertriginous candidiasis: In groin area. -Nystatin  powder topcially  Depression -Continue home Lexapro  and as needed hydroxyzine   Obesity, Class III, BMI 40-49.9 (morbid obesity) (HCC): Patient has Obesity Class III, with body weight 120.2 Kg and BMI 41.50 kg/m2.  - Encourage  losing weight - Exercise and healthy diet        DVT ppx:  SQ Lovenox   Code Status: Full code     Family Communication:    Yes, patient's granddaughter at bed side.       Disposition Plan:  Anticipate discharge back to previous environment  Consults called: None  Admission status and Level of care: Progressive:   as inpt        Dispo: The patient is from: Home              Anticipated d/c is to: Home              Anticipated d/c date is: 2 days              Patient currently is not medically stable to d/c.    Severity of Illness:  The appropriate patient status for this patient is INPATIENT. Inpatient status is judged to be reasonable and necessary in order to provide the required intensity of service to ensure the patient's safety. The patient's presenting symptoms, physical exam findings, and initial radiographic and laboratory data in the context of their chronic comorbidities is felt to place them at high risk for further clinical deterioration. Furthermore, it is not anticipated that the patient will be medically stable for discharge from the hospital within 2 midnights of admission.   * I certify that at the point of admission it is my clinical judgment that the patient will require inpatient hospital care spanning beyond 2 midnights from the point of admission due to high intensity of service, high risk for further deterioration and high frequency of surveillance required.*       Date of Service 08/16/2024    Caleb Exon [facility]   If 7PM-7AM, please contact night-coverage [email or web address] 08/16/2024, 1:01 AM

## 2024-08-15 NOTE — ED Notes (Signed)
 Ultrasound at bedside

## 2024-08-16 ENCOUNTER — Encounter: Payer: Self-pay | Admitting: Internal Medicine

## 2024-08-16 ENCOUNTER — Inpatient Hospital Stay

## 2024-08-16 DIAGNOSIS — F1090 Alcohol use, unspecified, uncomplicated: Secondary | ICD-10-CM | POA: Insufficient documentation

## 2024-08-16 DIAGNOSIS — G9341 Metabolic encephalopathy: Secondary | ICD-10-CM | POA: Diagnosis present

## 2024-08-16 DIAGNOSIS — N390 Urinary tract infection, site not specified: Secondary | ICD-10-CM | POA: Diagnosis present

## 2024-08-16 LAB — URINALYSIS, W/ REFLEX TO CULTURE (INFECTION SUSPECTED)
Bilirubin Urine: NEGATIVE
Glucose, UA: NEGATIVE mg/dL
Hgb urine dipstick: NEGATIVE
Ketones, ur: NEGATIVE mg/dL
Nitrite: NEGATIVE
Protein, ur: NEGATIVE mg/dL
Specific Gravity, Urine: 1.019 (ref 1.005–1.030)
pH: 5 (ref 5.0–8.0)

## 2024-08-16 LAB — COMPREHENSIVE METABOLIC PANEL WITH GFR
ALT: 19 U/L (ref 0–44)
AST: 38 U/L (ref 15–41)
Albumin: 3.5 g/dL (ref 3.5–5.0)
Alkaline Phosphatase: 99 U/L (ref 38–126)
Anion gap: 12 (ref 5–15)
BUN: 17 mg/dL (ref 8–23)
CO2: 21 mmol/L — ABNORMAL LOW (ref 22–32)
Calcium: 8.8 mg/dL — ABNORMAL LOW (ref 8.9–10.3)
Chloride: 106 mmol/L (ref 98–111)
Creatinine, Ser: 0.82 mg/dL (ref 0.44–1.00)
GFR, Estimated: >60 mL/min (ref >60)
Glucose, Bld: 130 mg/dL — ABNORMAL HIGH (ref 70–99)
Potassium: 4.2 mmol/L (ref 3.5–5.1)
Sodium: 139 mmol/L (ref 135–145)
Total Bilirubin: 0.7 mg/dL (ref 0.0–1.2)
Total Protein: 6.9 g/dL (ref 6.5–8.1)

## 2024-08-16 LAB — CBC
HCT: 39.8 % (ref 36.0–46.0)
Hemoglobin: 13.1 g/dL (ref 12.0–15.0)
MCH: 30.5 pg (ref 26.0–34.0)
MCHC: 32.9 g/dL (ref 30.0–36.0)
MCV: 92.6 fL (ref 80.0–100.0)
Platelets: 217 K/uL (ref 150–400)
RBC: 4.3 MIL/uL (ref 3.87–5.11)
RDW: 13.7 % (ref 11.5–15.5)
WBC: 10.4 K/uL (ref 4.0–10.5)
nRBC: 0 % (ref 0.0–0.2)

## 2024-08-16 LAB — CK: Total CK: 101 U/L (ref 38–234)

## 2024-08-16 LAB — PHOSPHORUS: Phosphorus: 2.8 mg/dL (ref 2.5–4.6)

## 2024-08-16 LAB — ETHANOL: Alcohol, Ethyl (B): <15 mg/dL

## 2024-08-16 LAB — MAGNESIUM: Magnesium: 1.8 mg/dL (ref 1.7–2.4)

## 2024-08-16 LAB — PRO BRAIN NATRIURETIC PEPTIDE: Pro Brain Natriuretic Peptide: 483 pg/mL — ABNORMAL HIGH (ref <300.0)

## 2024-08-16 MED ORDER — NYSTATIN 100000 UNIT/GM EX POWD
Freq: Two times a day (BID) | CUTANEOUS | Status: DC
  Administered 2024-09-01: 1 via TOPICAL
  Filled 2024-08-16 (×4): qty 15

## 2024-08-16 MED ORDER — LOPERAMIDE HCL 2 MG PO CAPS
2.0000 mg | ORAL_CAPSULE | ORAL | Status: DC | PRN
  Administered 2024-08-19: 2 mg via ORAL
  Filled 2024-08-16: qty 1

## 2024-08-16 MED ORDER — ESCITALOPRAM OXALATE 20 MG PO TABS
10.0000 mg | ORAL_TABLET | Freq: Every day | ORAL | Status: DC
  Administered 2024-08-16 – 2024-08-27 (×11): 10 mg via ORAL
  Filled 2024-08-16 (×12): qty 1

## 2024-08-16 MED ORDER — OXYCODONE HCL 5 MG PO TABS: 5.0000 mg | ORAL_TABLET | Freq: Four times a day (QID) | ORAL | Status: DC | PRN

## 2024-08-16 MED ORDER — HYDROXYZINE HCL 10 MG PO TABS
10.0000 mg | ORAL_TABLET | Freq: Three times a day (TID) | ORAL | Status: DC | PRN
Start: 2024-08-16 — End: 2024-08-16

## 2024-08-16 MED ORDER — SODIUM CHLORIDE 0.9 % IV SOLN
2.0000 g | INTRAVENOUS | Status: DC
  Administered 2024-08-16 – 2024-08-23 (×8): 2 g via INTRAVENOUS
  Filled 2024-08-16 (×9): qty 20

## 2024-08-16 NOTE — ED Notes (Addendum)
 Pt asking when she can go home. I informed her that she was still in admission status. Pt states that she's fine and theres nothing wrong with her. As pt is speaking, pt is lethargic and can not keep her eyes open talking to me. I informed the pt that she will have to speak to the provider regarding discharge. Pt's RN made aware.

## 2024-08-16 NOTE — ED Notes (Signed)
 L AC iv removed due to it bleeding and slipping out from pt constantly moving. Gauze applied and new line placed. Pt replaced on leads.

## 2024-08-16 NOTE — ED Notes (Signed)
 This RN and Glass Blower/designer assisted pt onto bed pain. Pt had one episode of stool and urine incontinence. Peri care provided and Nystatin  powder applied to abdomen and groin area. Pt tolerated well. Changed into clean gown. Fall bundle in place.

## 2024-08-16 NOTE — ED Notes (Signed)
 [name] bed alarm alarming, pt found on edge of bed stating she needs to use the bathroom, pt ambulatory to toilet and back to bed with assistance.

## 2024-08-16 NOTE — ED Notes (Signed)
 Pt repositioned by this RN. Tends to take off finger probe and leads. Pt redirected to not pull on leads. Pt verbalized understanding. Pt can answer neuro questions correctly but has to be redirected and asks questions repeatedly despite being told the answer. Call light within reach. Vitals WDL.

## 2024-08-16 NOTE — ED Notes (Signed)
 This NT assisted pt to the BR. Pt back to bed. Posey alarm back on.

## 2024-08-16 NOTE — ED Notes (Signed)
 Pt asking to see Doctor. Leads replaced on pt. Fall alarm still in place.

## 2024-08-16 NOTE — ED Notes (Signed)
 Pt calling out to use restroom. Pt placed on bed pan. Did not urinate. Pt grasping for things not there.

## 2024-08-16 NOTE — Progress Notes (Signed)
 PROGRESS NOTE    Haley Deleon   FMW:[id number] DOB: 08/08/1956  DOA: 08/15/2024 Date of Service: 08/16/24 which is hospital day 1  PCP: Ricard Tawni KIDD, MD    Hospital course / significant events:   Haley Deleon is a 68 y.o. female with medical history significant of alcohol use disorder, dCHF, CAD, depression with anxiety, morbid obesity, ulcerative colitis, NSVT, PSVT, varicose vein in both legs, carotid artery stenosis, morbid obesity, who presents with left leg swelling and pain, confusion.   HPI:  initially presented to ED with left leg swelling and pain, and then she developed confusion in ED.  When admitting hospitalist saw patient in ED, she was alert, orientated to place and person, not to the time. She does not have pain/swelling in the right leg.  She denies fall or injury.  No fever or chills.  Per her granddaughter, patient used to drink liquor almost every day. Recently she drinks less, and last drinking was several days ago.  Patient states that she only drinks once a week recently.   11/25: admitted to hospitalist w/ acute metabolic encephalopathy EtOH withdrawal, UTI.  11/26: remains confused / hallucinating this morning. Continuing withdrawal protocols      Consultants:  none  Procedures/Surgeries: none      ASSESSMENT & PLAN:   Acute metabolic encephalopathy d/t EtOH withdrawal, underlying UTI is probably a complicating factor but not primary cause of encephalopathy  Frequent neurochecks, CIWA Fall precaution Tx UTI as below   UTI: IV Rocephin  Follow-up urine culture   Pain and swelling of left lower leg Chronic venous stasis Ruled out DVT Supportive care   Chronic diastolic CHF (congestive heart failure)  2D echo on 02/24/2021 showed EF 60 to 65%.  Patient has leg edema, but no SOB.  Does not seem to have CHF exacerbation. Equivocal  BNP I&O Diuresis as needed   Ulcerative colitis  No active nausea vomiting or diarrhea.  No abdominal  pain. Continue home as needed Imodium    Intertriginous candidiasis:  In groin area. Nystatin  powder topcially   Depression Continue home Lexapro  if taking po    Class 3 obesity based on BMI: Body mass index is 41.5 kg/m.SABRA Significantly low or high BMI is associated with higher medical risk.  Underweight - under 18  overweight - 25 to 29 obese - 30 or more Class 1 obesity: BMI of 30.0 to 34 Class 2 obesity: BMI of 35.0 to 39 Class 3 obesity: BMI of 40.0 to 49 Super Morbid Obesity: BMI 50-59 Super-super Morbid Obesity: BMI 60+ Healthy nutrition and physical activity advised as adjunct to other disease management and risk reduction treatments    DVT prophylaxis: lovenox  IV fluids: no continuous IV fluids  Nutrition: dysphagia diet if coherent to [employer] / other devices: none  Code Status: FULL CODE ACP documentation reviewed:  none on file in [facility]  TOC needs: TBD Medical barriers to dispo: withdrawal. Expected medical readiness for discharge pending clinical course.              Subjective / Brief ROS:  Patient unable to contribute on rounds, she is disorienting, she is speaking full sentences and sometimes following command but zero insight   Family Communication: none at bedside     Objective Findings:  Vitals:   08/16/24 1530 08/16/24 1630 08/16/24 1715 08/16/24 1715  BP: 135/88 (!) 152/76 (!) 145/81   Pulse: 89 86    Resp:  18 20   Temp:  98.3 F (36.8 C)  TempSrc:    Oral  SpO2: 99% 100%    Weight:      Height:       No intake or output data in the 24 hours ending 08/16/24 1755 Filed Weights   08/15/24 1709  Weight: 120.2 kg    Examination:  Physical Exam Cardiovascular:     Rate and Rhythm: Normal rate and regular rhythm.  Pulmonary:     Effort: Pulmonary effort is normal.     Breath sounds: Normal breath sounds.  Abdominal:     General: Bowel sounds are normal.  Musculoskeletal:     Right lower leg: Edema present.      Left lower leg: Edema present.  Skin:    General: Skin is warm and dry.  Neurological:     Mental Status: She is alert. She is disoriented.          Scheduled Medications:   enoxaparin  (LOVENOX ) injection  60 mg Subcutaneous Q24H   escitalopram   10 mg Oral Daily   folic acid   1 mg Oral Daily   LORazepam   0-4 mg Intravenous Q6H   Followed by   Haley ON 08/18/2024] LORazepam   0-4 mg Intravenous Q12H   multivitamin with minerals  1 tablet Oral Daily   nystatin    Topical BID   thiamine   100 mg Oral Daily   Or   thiamine   100 mg Intravenous Daily    Continuous Infusions:  cefTRIAXone  (ROCEPHIN )  IV Stopped (08/16/24 0256)    PRN Medications:  acetaminophen , hydrALAZINE , loperamide , LORazepam  **OR** LORazepam , ondansetron  (ZOFRAN ) IV, oxyCODONE   Antimicrobials from admission:  Anti-infectives (From admission, onward)    Start     Dose/Rate Route Frequency Ordered Stop   08/16/24 0100  cefTRIAXone  (ROCEPHIN ) 2 g in sodium chloride  0.9 % 100 mL IVPB        2 g 200 mL/hr over 30 Minutes Intravenous Every 24 hours 08/16/24 0057             Data Reviewed:  I have personally reviewed the following...  CBC: Recent Labs  Lab 08/15/24 1711 08/16/24 0625  WBC 10.1 10.4  HGB 14.2 13.1  HCT 43.5 39.8  MCV 91.8 92.6  PLT 248 217   Basic Metabolic Panel: Recent Labs  Lab 08/15/24 1711 08/15/24 2238 08/16/24 0507  NA 140  --  139  K 4.0  --  4.2  CL 105  --  106  CO2 22  --  21*  GLUCOSE 151*  --  130*  BUN 15  --  17  CREATININE 0.97  --  0.82  CALCIUM 9.3  --  8.8*  MG  --  1.8  --   PHOS  --  2.8  --    GFR: Estimated Creatinine Clearance: 89.3 mL/min (by C-G formula based on SCr of 0.82 mg/dL). Liver Function Tests: Recent Labs  Lab 08/16/24 0507  AST 38  ALT 19  ALKPHOS 99  BILITOT 0.7  PROT 6.9  ALBUMIN 3.5   No results for input(s): LIPASE, AMYLASE in the last 168 hours. Recent Labs  Lab 08/15/24 2238  AMMONIA 19    Coagulation Profile: No results for input(s): INR, PROTIME in the last 168 hours. Cardiac Enzymes: Recent Labs  Lab 08/15/24 2238  CKTOTAL 101   BNP (last 3 results) Recent Labs    08/15/24 2238  PROBNP 483.0*   HbA1C: No results for input(s): HGBA1C in the last 72 hours. CBG: No results for  input(s): GLUCAP in the last 168 hours. Lipid Profile: No results for input(s): CHOL, HDL, LDLCALC, TRIG, CHOLHDL, LDLDIRECT in the last 72 hours. Thyroid Function Tests: No results for input(s): TSH, T4TOTAL, FREET4, T3FREE, THYROIDAB in the last 72 hours. Anemia Panel: No results for input(s): VITAMINB12, FOLATE, FERRITIN, TIBC, IRON, RETICCTPCT in the last 72 hours. Most Recent Urinalysis On File:     Component Value Date/Time   COLORURINE YELLOW (A) 08/15/2024 2318   APPEARANCEUR CLOUDY (A) 08/15/2024 2318   APPEARANCEUR Clear 02/23/2014 2251   LABSPEC 1.019 08/15/2024 2318   LABSPEC 1.016 02/23/2014 2251   PHURINE 5.0 08/15/2024 2318   GLUCOSEU NEGATIVE 08/15/2024 2318   GLUCOSEU Negative 02/23/2014 2251   HGBUR NEGATIVE 08/15/2024 2318   BILIRUBINUR NEGATIVE 08/15/2024 2318   BILIRUBINUR Negative 02/23/2014 2251   KETONESUR NEGATIVE 08/15/2024 2318   PROTEINUR NEGATIVE 08/15/2024 2318   NITRITE NEGATIVE 08/15/2024 2318   LEUKOCYTESUR SMALL (A) 08/15/2024 2318   LEUKOCYTESUR Trace 02/23/2014 2251   Sepsis Labs: @LABRCNTIP (procalcitonin:4,lacticidven:4) Microbiology: No results found for this or any previous visit (from the past 240 hours).    Radiology Studies last 3 days: DG Tibia/Fibula Left Result Date: 08/16/2024 EXAM: VIEW(S) XRAY OF THE LEFT TIBIA AND FIBULA 08/16/2024 02:30:00 AM COMPARISON: 10/25/2020 CLINICAL HISTORY: Pain in left lower leg FINDINGS: BONES AND JOINTS: Mild narrowing of the medial joint space is seen. Mild patellofemoral degenerative changes are noted as well. No acute fracture or dislocation is seen.  SOFT TISSUES: Soft tissue swelling was noted. IMPRESSION: 1. Degenerative changes without acute abnormality. Electronically signed by: Oneil Devonshire MD 08/16/2024 02:34 AM EST RP Workstation: [id number]   CT Head Wo Contrast Result Date: 08/15/2024 EXAM: CT HEAD WITHOUT CONTRAST 08/15/2024 11:21:07 PM TECHNIQUE: CT of the head was performed without the administration of intravenous contrast. Automated exposure control, iterative reconstruction, and/or weight based adjustment of the mA/kV was utilized to reduce the radiation dose to as low as reasonably achievable. COMPARISON: None available. CLINICAL HISTORY: Mental status change, unknown cause FINDINGS: BRAIN AND VENTRICLES: No acute hemorrhage. No evidence of acute infarct. No hydrocephalus. No extra-axial collection. No mass effect or midline shift. ORBITS: No acute abnormality. SINUSES: No acute abnormality. SOFT TISSUES AND SKULL: No acute soft tissue abnormality. No skull fracture. IMPRESSION: 1. No acute intracranial abnormality. Electronically signed by: Franky Crease MD 08/15/2024 11:23 PM EST RP Workstation: [id number]   US  Venous Img Lower  Left (DVT Study) Result Date: 08/15/2024 EXAM: ULTRASOUND DUPLEX OF THE LEFT LOWER EXTREMITY VEINS TECHNIQUE: Duplex ultrasound using B-mode/gray scaled imaging and Doppler spectral analysis and color flow was obtained of the deep venous structures of the left lower extremity. COMPARISON: None available. CLINICAL HISTORY: swelling FINDINGS: The common femoral vein, femoral vein, popliteal vein, and posterior tibial vein of the left lower extremity demonstrate normal compressibility with normal color flow and spectral analysis. IMPRESSION: 1. No evidence of DVT in the left lower extremity. Electronically signed by: Franky Crease MD 08/15/2024 09:46 PM EST RP Workstation: [id number]          Laneta Blunt, DO [facility] 08/16/2024, 5:55 PM    Dictation software may have been used to generate  the above note. Typos may occur and escape review in typed/dictated notes. Please contact Dr Blunt directly for clarity if needed.  Staff may message me via secure chat in Epic  but this may not receive an immediate response,  please page me for urgent matters!  If 7PM-7AM, please contact night coverage [email or web address]

## 2024-08-16 NOTE — ED Notes (Signed)
 Pt's granddaughter Haley Deleon's phone number [phone]

## 2024-08-16 NOTE — Discharge Instructions (Signed)

## 2024-08-16 NOTE — Progress Notes (Signed)
 [facility] Brief Progress Note   _____________________________________________________________________________________________________________  Patient Name: Haley Deleon Patient DOB: [birth year] Date: @TODAY @      Data: Reviewed labs, VS, notes.     Action: No action needed at this time.      Response:    _____________________________________________________________________________________________________________  [facility] RN Expeditor Sharolyn JONETTA Batman Please contact us  directly via secure chat (search for [facility]) or by calling us  at [phone] [facility]).

## 2024-08-16 NOTE — ED Notes (Signed)
 Fall Bundle in place

## 2024-08-16 NOTE — ED Notes (Signed)
 Pt's bed alarm alarming, RN entered room to find patient sitting on edge of bed stating she needs to go to the bathroom. Pt assisted to toilet at this time.

## 2024-08-16 NOTE — Hospital Course (Addendum)
 Hospital course / significant events:   Haley Deleon is a 68 y.o. female with medical history significant of alcohol  use disorder, dCHF, CAD, depression with anxiety, morbid obesity, ulcerative colitis, NSVT, PSVT, varicose vein in both legs, carotid artery stenosis, morbid obesity, who presents with left leg swelling and pain, confusion.   HPI:  initially presented to ED with left leg swelling and pain, and then she developed confusion in ED.  When admitting hospitalist saw patient in ED, she was alert, orientated to place and person, not to the time. She does not have pain/swelling in the right leg.  She denies fall or injury.  No fever or chills.  Per her granddaughter, patient used to drink liquor almost every day. Recently she reportedly drinks less, and last drinking was several days ago.   11/25: admitted to hospitalist w/ acute metabolic encephalopathy EtOH withdrawal, UTI.  11/26: remains confused / hallucinating this morning. Continuing withdrawal protocols  11/27: remains confused but is more calm today. Urine culture still pending.  11/28: remains confused but is more calm today and bit more conversant but not making sense. Urine culture still pending - staff has confirmed w/ lab that it is in process today as of 05:00 11/29: more alert but remains confused. PT/OT ordered for tomorrow. UCX pend susceptibilities  11/30: Proteus and Ecoli both sensitive to rocephin . Febrile this AM so will leave on abx, CXR read as possible R mid-lung atelectasis / early infection, also vascular congestion. Gave dose Lasix  and will add azithro for possible PNA 12/01: continues to be febrile, more oriented but still confused / not conversational so difficult history. No skin lesions. Added vancomycin  given (+)MRSA screen and skin erythema groin, but likely d/t parainfluenza virus 12/02: febrile but improved, remains somewhat confused, continue abx  12/9: ID consulted for AMS and recurrent fevers after  initial treatment of UTI and CAP. 12/10: Neurology consulted for AMS. defervescing. Some improvement Mental status. UA not a clean catch but mixed. Some cough today. MRI head neg. Neck is supple so doubt meningitis and the waxing and waning mental status would be unusual for bacterial meningitis  12/11: Afebrile, more alert but remains confused.  Coughing on exam, Unsuccessful LP attempt.  12/12: Remains encephalopathic, high risk for aspiration.  PCCM consulted to evaluate ability to protect airway and possible need for intubation.  Palliative Care consulted. 12/12 INTUBATED 12/12 CVL placed 12/12 [facility] 12/13: ordered for BLE dopplers for mottling L>R, attempting to wean pressors (vaso/Levo). Started on IV hydrocortisone  q6h for septic shock. CXR with basilar atelectasis. Initiated enteral feeds via NGT. Urine culture pending.  12/14: encephalopathic, discontinued vasopressin  12/15 remains on vent 22-Sep-2024.  Critical care team transferred care to medical team patient on morphine  drip for comfort care measures and end-of-life care.

## 2024-08-16 NOTE — Progress Notes (Signed)
 PHARMACIST - PHYSICIAN COMMUNICATION  CONCERNING:  Enoxaparin  (Lovenox ) for DVT Prophylaxis    RECOMMENDATION: Patient was prescribed enoxaprin 40mg  q24 hours for VTE prophylaxis.   Filed Weights   08/15/24 1709  Weight: 120.2 kg (265 lb)    Body mass index is 41.5 kg/m.  Estimated Creatinine Clearance: 75.5 mL/min (by C-G formula based on SCr of 0.97 mg/dL).   Based on [facility] policy patient is candidate for enoxaparin  0.5mg /kg TBW SQ every 24 hours based on BMI being >30.  DESCRIPTION: Pharmacy has adjusted enoxaparin  dose per [facility] policy.  Patient is now receiving enoxaparin  0.5 mg/kg every 24 hours   Rankin CANDIE Dills, PharmD, [facility] 08/16/2024 12:00 AM

## 2024-08-16 NOTE — ED Notes (Signed)
 RN noticed pt was off monitor, RN entered room to find patient naked and had removed herself from the monitor. Pt placed in clean gown. Pt demanding to get up and walk to bathroom, pt began climbing out of bed. This RN assisted pt to her feet but pt was unable to take steps toward the toilet. Pt returned to bed, bed alarm on. Bed in low locked position, call bell in reach.

## 2024-08-16 NOTE — ED Notes (Signed)
 Patient ambulatory to toilet and back to bed with assistance.

## 2024-08-16 NOTE — TOC Initial Note (Signed)
 Transition of Care [facility]) - Initial/Assessment Note    Patient Details  Name: Haley Deleon MRN: [id number] Date of Birth: 1956-01-15  Transition of Care [facility]) CM/SW Contact:    Katurah Karapetian L Zaniel Marineau, LCSW Phone Number: 08/16/2024, 8:43 AM  Clinical Narrative:                    [facility] consult received for substance abuse education/counseling. TOC does not provide counseling. Resources have been added to the AVS.      Patient Goals and CMS Choice            Expected Discharge Plan and Services                                              Prior Living Arrangements/Services                       Activities of Daily Living      Permission Sought/Granted                  Emotional Assessment              Admission diagnosis:  Alcohol withdrawal (HCC) [F10.939] Acute metabolic encephalopathy [G93.41] Patient Active Problem List   Diagnosis Date Noted   UTI (urinary tract infection) 08/16/2024   Acute metabolic encephalopathy 08/16/2024   Alcohol use disorder    Alcohol withdrawal (HCC) 08/15/2024   Chronic diastolic CHF (congestive heart failure) (HCC) 08/15/2024   Intertriginous candidiasis 08/15/2024   Depression 08/15/2024   Pain and swelling of left lower leg 08/15/2024   Obesity, Class III, BMI 40-49.9 (morbid obesity) (HCC) 08/15/2024   PSVT (paroxysmal supraventricular tachycardia) 07/17/2021   Palpitations 02/24/2021   NSVT (nonsustained ventricular tachycardia) (HCC) 02/24/2021   Palpitation 02/24/2021   Varicose veins of lower extremity with pain, bilateral 07/09/2020   Varicose veins of leg with pain, left 06/14/2020   Dizziness 11/17/2017   Murmur, cardiac 11/17/2017   Screening for cardiovascular condition 11/17/2017   Lower extremity edema 11/17/2017   Gallstones and inflammation of gallbladder without obstruction    Incisional hernia, without obstruction or gangrene    History of biliary T-tube placement 02/24/2014    Cellulitis 02/24/2014   Cellulitis and abscess 02/24/2014   Ulcerative colitis (HCC) 08/22/2013   PCP:  Ricard Tawni KIDD, MD Pharmacy:   [facility] [address] ([address]), [place] - [address] [place]) [place] [address] Phone: [phone] Fax: [phone]     Social Drivers of Health (SDOH) Social History: SDOH Screenings   Tobacco Use: Medium Risk (08/15/2024)   SDOH Interventions:     Readmission Risk Interventions     No data to display

## 2024-08-16 NOTE — ED Notes (Signed)
 Gown replaced on pt after taking it  off

## 2024-08-16 NOTE — ED Notes (Signed)
 Pt placed on bedpan. Had one episode of urination. New gown applied and boosted in bed. Family at bedside. Partner at bedside took pts keys and purse with the pt verbalizing consent.

## 2024-08-17 DIAGNOSIS — G9341 Metabolic encephalopathy: Secondary | ICD-10-CM | POA: Diagnosis not present

## 2024-08-17 LAB — BASIC METABOLIC PANEL WITH GFR
Anion gap: 11 (ref 5–15)
BUN: 12 mg/dL (ref 8–23)
CO2: 22 mmol/L (ref 22–32)
Calcium: 8.8 mg/dL — ABNORMAL LOW (ref 8.9–10.3)
Chloride: 107 mmol/L (ref 98–111)
Creatinine, Ser: 0.72 mg/dL (ref 0.44–1.00)
GFR, Estimated: >60 mL/min (ref >60)
Glucose, Bld: 107 mg/dL — ABNORMAL HIGH (ref 70–99)
Potassium: 3.7 mmol/L (ref 3.5–5.1)
Sodium: 140 mmol/L (ref 135–145)

## 2024-08-17 LAB — AMMONIA: Ammonia: 22 umol/L (ref 9–35)

## 2024-08-17 LAB — HIV ANTIBODY (ROUTINE TESTING W REFLEX): HIV Screen 4th Generation wRfx: NONREACTIVE

## 2024-08-17 MED ORDER — LORAZEPAM 2 MG/ML IJ SOLN
2.0000 mg | Freq: Four times a day (QID) | INTRAMUSCULAR | Status: DC | PRN
  Administered 2024-08-17 – 2024-08-27 (×2): 2 mg via INTRAVENOUS
  Filled 2024-08-17 (×2): qty 1

## 2024-08-17 NOTE — Plan of Care (Signed)
  Problem: Education: Goal: Knowledge of General Education information will improve Description: Including pain rating scale, medication(s)/side effects and non-pharmacologic comfort measures Outcome: Progressing   Problem: Clinical Measurements: Goal: Cardiovascular complication will be avoided Outcome: Progressing   Problem: Coping: Goal: Level of anxiety will decrease Outcome: Progressing   

## 2024-08-17 NOTE — Plan of Care (Signed)

## 2024-08-17 NOTE — Progress Notes (Signed)
 Patient is on CIWA protocol, disoriented and lethargic unable to complete screening assessment on admission. Will notify incoming shift.

## 2024-08-17 NOTE — Progress Notes (Signed)
 PROGRESS NOTE    Haley Deleon   FMW:[id number] DOB: 1956-07-25  DOA: 08/15/2024 Date of Service: 08/17/24 which is hospital day 2  PCP: Ricard Tawni KIDD, MD    Hospital course / significant events:   Haley Deleon is a 68 y.o. female with medical history significant of alcohol use disorder, dCHF, CAD, depression with anxiety, morbid obesity, ulcerative colitis, NSVT, PSVT, varicose vein in both legs, carotid artery stenosis, morbid obesity, who presents with left leg swelling and pain, confusion.   HPI:  initially presented to ED with left leg swelling and pain, and then she developed confusion in ED.  When admitting hospitalist saw patient in ED, she was alert, orientated to place and person, not to the time. She does not have pain/swelling in the right leg.  She denies fall or injury.  No fever or chills.  Per her granddaughter, patient used to drink liquor almost every day. Recently she drinks less, and last drinking was several days ago.  Patient states that she only drinks once a week recently.   11/25: admitted to hospitalist w/ acute metabolic encephalopathy EtOH withdrawal, UTI.  11/26: remains confused / hallucinating this morning. Continuing withdrawal protocols  11/27: remains confused but is more calm today. Urine culture still pending.      Consultants:  none  Procedures/Surgeries: none      ASSESSMENT & PLAN:   Acute metabolic encephalopathy d/t EtOH withdrawal, underlying UTI is probably a complicating factor but not primary cause of encephalopathy  Frequent neurochecks, CIWA Fall precaution Tx UTI as below   UTI: IV Rocephin  Follow-up urine culture   Pain and swelling of left lower leg Chronic venous stasis Ruled out DVT Supportive care   Chronic diastolic CHF (congestive heart failure)  2D echo on 02/24/2021 showed EF 60 to 65%.  Patient has leg edema, but no SOB.  Does not seem to have CHF exacerbation. Equivocal  BNP I&O Diuresis as  needed   Ulcerative colitis  No active nausea vomiting or diarrhea.   Continue home as needed Imodium    Intertriginous candidiasis:  In groin area. Nystatin  powder topcially   Depression Continue home Lexapro  if able to take po but ok to hold if needed     Class 3 obesity based on BMI: Body mass index is 41.5 kg/m.SABRA Significantly low or high BMI is associated with higher medical risk.  Underweight - under 18  overweight - 25 to 29 obese - 30 or more Class 1 obesity: BMI of 30.0 to 34 Class 2 obesity: BMI of 35.0 to 39 Class 3 obesity: BMI of 40.0 to 49 Super Morbid Obesity: BMI 50-59 Super-super Morbid Obesity: BMI 60+ Healthy nutrition and physical activity advised as adjunct to other disease management and risk reduction treatments    DVT prophylaxis: lovenox  IV fluids: no continuous IV fluids  Nutrition: dysphagia diet if coherent to [employer] / other devices: none  Code Status: FULL CODE ACP documentation reviewed:  none on file in [facility]  TOC needs: TBD Medical barriers to dispo: withdrawal. Expected medical readiness for discharge pending clinical course.              Subjective / Brief ROS:  Patient unable to contribute on rounds, she is disorienting, she is speaking full sentences and sometimes following command but zero insight   Family Communication: significant other and granddaughters at bedside. Granddaughter [name] is 47 and helps with care, other granddaughter is a minor. Pt's sister reportedly not involved. Pt's  daughter deceased, family states that pt's drinking started after the death of her daughter. Her significant other Manford has concerns about if she can get her knee replacement while here, I advised no. He reports she drinks to help with the pain of knee arthritis, granddaughter [name] believes the etoh use is more for depression after the death of patient's daughter / Haley's mom. All questions answered. Continue current plan of care  but I did relay my concerns that if pt is not improving over the next few days then prognosis is poor.     Objective Findings:  Vitals:   08/17/24 0535 08/17/24 0655 08/17/24 0741 08/17/24 1252  BP:  118/69 (!) 140/73 (!) 164/77  Pulse:  84 95 72  Resp:   20   Temp:   100 F (37.8 C) 99.2 F (37.3 C)  TempSrc:   Oral Axillary  SpO2:   96% 96%  Weight: 121.2 kg     Height:        Intake/Output Summary (Last 24 hours) at 08/17/2024 1530 Last data filed at 08/17/2024 0400 Gross per 24 hour  Intake 100 ml  Output 150 ml  Net -50 ml   Filed Weights   08/15/24 1709 08/16/24 2150 08/17/24 0535  Weight: 120.2 kg 121.2 kg 121.2 kg    Examination:  Physical Exam Cardiovascular:     Rate and Rhythm: Normal rate and regular rhythm.  Pulmonary:     Effort: Pulmonary effort is normal.     Breath sounds: Normal breath sounds.  Abdominal:     General: Bowel sounds are normal.  Musculoskeletal:     Right lower leg: Edema present.     Left lower leg: Edema present.  Skin:    General: Skin is warm and dry.  Neurological:     Mental Status: She is alert. She is disoriented.          Scheduled Medications:   enoxaparin  (LOVENOX ) injection  60 mg Subcutaneous Q24H   escitalopram   10 mg Oral Daily   folic acid   1 mg Oral Daily   LORazepam   0-4 mg Intravenous Q6H   Followed by   NOREEN ON 08/18/2024] LORazepam   0-4 mg Intravenous Q12H   multivitamin with minerals  1 tablet Oral Daily   nystatin    Topical BID   thiamine   100 mg Oral Daily   Or   thiamine   100 mg Intravenous Daily    Continuous Infusions:  cefTRIAXone  (ROCEPHIN )  IV 2 g (08/17/24 0029)    PRN Medications:  acetaminophen , hydrALAZINE , loperamide , LORazepam  **OR** LORazepam , ondansetron  (ZOFRAN ) IV, oxyCODONE   Antimicrobials from admission:  Anti-infectives (From admission, onward)    Start     Dose/Rate Route Frequency Ordered Stop   08/16/24 0100  cefTRIAXone  (ROCEPHIN ) 2 g in sodium chloride   0.9 % 100 mL IVPB        2 g 200 mL/hr over 30 Minutes Intravenous Every 24 hours 08/16/24 0057             Data Reviewed:  I have personally reviewed the following...  CBC: Recent Labs  Lab 08/15/24 1711 08/16/24 0625  WBC 10.1 10.4  HGB 14.2 13.1  HCT 43.5 39.8  MCV 91.8 92.6  PLT 248 217   Basic Metabolic Panel: Recent Labs  Lab 08/15/24 1711 08/15/24 2238 08/16/24 0507 08/17/24 0942  NA 140  --  139 140  K 4.0  --  4.2 3.7  CL 105  --  106 107  CO2 22  --  21* 22  GLUCOSE 151*  --  130* 107*  BUN 15  --  17 12  CREATININE 0.97  --  0.82 0.72  CALCIUM 9.3  --  8.8* 8.8*  MG  --  1.8  --   --   PHOS  --  2.8  --   --    GFR: Estimated Creatinine Clearance: 92 mL/min (by C-G formula based on SCr of 0.72 mg/dL). Liver Function Tests: Recent Labs  Lab 08/16/24 0507  AST 38  ALT 19  ALKPHOS 99  BILITOT 0.7  PROT 6.9  ALBUMIN 3.5   No results for input(s): LIPASE, AMYLASE in the last 168 hours. Recent Labs  Lab 08/15/24 2238 08/17/24 0942  AMMONIA 19 22   Coagulation Profile: No results for input(s): INR, PROTIME in the last 168 hours. Cardiac Enzymes: Recent Labs  Lab 08/15/24 2238  CKTOTAL 101   BNP (last 3 results) Recent Labs    08/15/24 2238  PROBNP 483.0*   HbA1C: No results for input(s): HGBA1C in the last 72 hours. CBG: No results for input(s): GLUCAP in the last 168 hours. Lipid Profile: No results for input(s): CHOL, HDL, LDLCALC, TRIG, CHOLHDL, LDLDIRECT in the last 72 hours. Thyroid Function Tests: No results for input(s): TSH, T4TOTAL, FREET4, T3FREE, THYROIDAB in the last 72 hours. Anemia Panel: No results for input(s): VITAMINB12, FOLATE, FERRITIN, TIBC, IRON, RETICCTPCT in the last 72 hours. Most Recent Urinalysis On File:     Component Value Date/Time   COLORURINE YELLOW (A) 08/15/2024 2318   APPEARANCEUR CLOUDY (A) 08/15/2024 2318   APPEARANCEUR Clear 02/23/2014  2251   LABSPEC 1.019 08/15/2024 2318   LABSPEC 1.016 02/23/2014 2251   PHURINE 5.0 08/15/2024 2318   GLUCOSEU NEGATIVE 08/15/2024 2318   GLUCOSEU Negative 02/23/2014 2251   HGBUR NEGATIVE 08/15/2024 2318   BILIRUBINUR NEGATIVE 08/15/2024 2318   BILIRUBINUR Negative 02/23/2014 2251   KETONESUR NEGATIVE 08/15/2024 2318   PROTEINUR NEGATIVE 08/15/2024 2318   NITRITE NEGATIVE 08/15/2024 2318   LEUKOCYTESUR SMALL (A) 08/15/2024 2318   LEUKOCYTESUR Trace 02/23/2014 2251   Sepsis Labs: @LABRCNTIP (procalcitonin:4,lacticidven:4) Microbiology: No results found for this or any previous visit (from the past 240 hours).    Radiology Studies last 3 days: DG Tibia/Fibula Left Result Date: 08/16/2024 EXAM: VIEW(S) XRAY OF THE LEFT TIBIA AND FIBULA 08/16/2024 02:30:00 AM COMPARISON: 10/25/2020 CLINICAL HISTORY: Pain in left lower leg FINDINGS: BONES AND JOINTS: Mild narrowing of the medial joint space is seen. Mild patellofemoral degenerative changes are noted as well. No acute fracture or dislocation is seen. SOFT TISSUES: Soft tissue swelling was noted. IMPRESSION: 1. Degenerative changes without acute abnormality. Electronically signed by: Oneil Devonshire MD 08/16/2024 02:34 AM EST RP Workstation: [id number]   CT Head Wo Contrast Result Date: 08/15/2024 EXAM: CT HEAD WITHOUT CONTRAST 08/15/2024 11:21:07 PM TECHNIQUE: CT of the head was performed without the administration of intravenous contrast. Automated exposure control, iterative reconstruction, and/or weight based adjustment of the mA/kV was utilized to reduce the radiation dose to as low as reasonably achievable. COMPARISON: None available. CLINICAL HISTORY: Mental status change, unknown cause FINDINGS: BRAIN AND VENTRICLES: No acute hemorrhage. No evidence of acute infarct. No hydrocephalus. No extra-axial collection. No mass effect or midline shift. ORBITS: No acute abnormality. SINUSES: No acute abnormality. SOFT TISSUES AND SKULL: No acute soft  tissue abnormality. No skull fracture. IMPRESSION: 1. No acute intracranial abnormality. Electronically signed by: Franky Crease MD 08/15/2024 11:23 PM EST RP Workstation: [id number]   US  Venous Img Lower  Left (DVT Study) Result Date: 08/15/2024 EXAM: ULTRASOUND DUPLEX OF THE LEFT LOWER EXTREMITY VEINS TECHNIQUE: Duplex ultrasound using B-mode/gray scaled imaging and Doppler spectral analysis and color flow was obtained of the deep venous structures of the left lower extremity. COMPARISON: None available. CLINICAL HISTORY: swelling FINDINGS: The common femoral vein, femoral vein, popliteal vein, and posterior tibial vein of the left lower extremity demonstrate normal compressibility with normal color flow and spectral analysis. IMPRESSION: 1. No evidence of DVT in the left lower extremity. Electronically signed by: Franky Crease MD 08/15/2024 09:46 PM EST RP Workstation: [id number]          Laneta Blunt, DO [facility] 08/17/2024, 3:30 PM    Dictation software may have been used to generate the above note. Typos may occur and escape review in typed/dictated notes. Please contact Dr Blunt directly for clarity if needed.  Staff may message me via secure chat in Epic  but this may not receive an immediate response,  please page me for urgent matters!  If 7PM-7AM, please contact night coverage [email or web address]

## 2024-08-18 DIAGNOSIS — G9341 Metabolic encephalopathy: Secondary | ICD-10-CM | POA: Diagnosis not present

## 2024-08-18 NOTE — Care Management Important Message (Signed)
 Important Message  Patient Details  Name: CATHELINE HIXON MRN: [id number] Date of Birth: 1955/12/25   Important Message Given:  Yes - Medicare IM     Rojelio SHAUNNA Rattler 08/18/2024, 5:13 PM

## 2024-08-18 NOTE — Plan of Care (Signed)
  Problem: Clinical Measurements: Goal: Diagnostic test results will improve Outcome: Progressing Goal: Cardiovascular complication will be avoided Outcome: Progressing   Problem: Nutrition: Goal: Adequate nutrition will be maintained Outcome: Progressing   Problem: Coping: Goal: Level of anxiety will decrease Outcome: Progressing   Problem: Elimination: Goal: Will not experience complications related to urinary retention Outcome: Progressing   Problem: Pain Managment: Goal: General experience of comfort will improve and/or be controlled Outcome: Progressing   Problem: Safety: Goal: Ability to remain free from injury will improve Outcome: Progressing

## 2024-08-18 NOTE — Progress Notes (Signed)
 PROGRESS NOTE    Haley Deleon   FMW:[id number] DOB: October 03, 1955  DOA: 08/15/2024 Date of Service: 08/18/24 which is hospital day 3  PCP: Ricard Tawni KIDD, MD    Hospital course / significant events:   Haley Deleon is a 68 y.o. female with medical history significant of alcohol use disorder, dCHF, CAD, depression with anxiety, morbid obesity, ulcerative colitis, NSVT, PSVT, varicose vein in both legs, carotid artery stenosis, morbid obesity, who presents with left leg swelling and pain, confusion.   HPI:  initially presented to ED with left leg swelling and pain, and then she developed confusion in ED.  When admitting hospitalist saw patient in ED, she was alert, orientated to place and person, not to the time. She does not have pain/swelling in the right leg.  She denies fall or injury.  No fever or chills.  Per her granddaughter, patient used to drink liquor almost every day. Recently she drinks less, and last drinking was several days ago.  Patient states that she only drinks once a week recently.   11/25: admitted to hospitalist w/ acute metabolic encephalopathy EtOH withdrawal, UTI.  11/26: remains confused / hallucinating this morning. Continuing withdrawal protocols  11/27: remains confused but is more calm today. Urine culture still pending.  11/28: remains confused but is more calm today and bit more conversant but not making sense. Urine culture still pending - staff has confirmed w/ lab that it is in process today as of 05:00     Consultants:  none  Procedures/Surgeries: none      ASSESSMENT & PLAN:   Acute metabolic encephalopathy d/t EtOH withdrawal, underlying UTI is probably a complicating factor but not primary cause of encephalopathy  Frequent neurochecks, CIWA Fall precaution Tx UTI as below   UTI: IV Rocephin  Follow-up urine culture - culture still pending - staff has confirmed w/ lab that it is in process today 08/18/24 as of 05:00   Pain and  swelling of left lower leg Chronic venous stasis Ruled out DVT Supportive care   Chronic diastolic CHF (congestive heart failure)  2D echo on 02/24/2021 showed EF 60 to 65%.  Patient has leg edema, but no SOB.  Does not seem to have CHF exacerbation. Equivocal  BNP I&O Diuresis as needed   Ulcerative colitis  No active nausea vomiting or diarrhea.   Continue as needed Imodium    Intertriginous candidiasis:  In groin area. Nystatin  powder topcially   Depression Continue home Lexapro  if able to take po but ok to hold if needed     Class 3 obesity based on BMI: Body mass index is 41.5 kg/m.SABRA Significantly low or high BMI is associated with higher medical risk.  Underweight - under 18  overweight - 25 to 29 obese - 30 or more Class 1 obesity: BMI of 30.0 to 34 Class 2 obesity: BMI of 35.0 to 39 Class 3 obesity: BMI of 40.0 to 49 Super Morbid Obesity: BMI 50-59 Super-super Morbid Obesity: BMI 60+ Healthy nutrition and physical activity advised as adjunct to other disease management and risk reduction treatments    DVT prophylaxis: lovenox  IV fluids: no continuous IV fluids  Nutrition: dysphagia diet if coherent to [employer] / other devices: none  Code Status: FULL CODE ACP documentation reviewed:  none on file in [facility]  TOC needs: TBD Medical barriers to dispo: withdrawal. Expected medical readiness for discharge pending clinical course.              Subjective /  Brief ROS:  Patient unable to contribute on rounds, she is disoriented, she is speaking full sentences and sometimes following command but zero insight - about same as yesterday   Family Communication: significant other at bedside     Objective Findings:  Vitals:   08/18/24 0413 08/18/24 0535 08/18/24 0854 08/18/24 1146  BP:   121/62 (!) 155/72  Pulse:  94 83 (!) 103  Resp:   (!) 21 15  Temp:   98.4 F (36.9 C) 98 F (36.7 C)  TempSrc:   Oral Oral  SpO2:  94% 94% 95%  Weight:  119.2 kg     Height:        Intake/Output Summary (Last 24 hours) at 08/18/2024 1405 Last data filed at 08/18/2024 1012 Gross per 24 hour  Intake 630 ml  Output 670 ml  Net -40 ml   Filed Weights   08/16/24 2150 08/17/24 0535 08/18/24 0413  Weight: 121.2 kg 121.2 kg 119.2 kg    Examination:  Physical Exam Cardiovascular:     Rate and Rhythm: Normal rate and regular rhythm.  Pulmonary:     Effort: Pulmonary effort is normal.     Breath sounds: Normal breath sounds.  Abdominal:     General: Bowel sounds are normal.  Musculoskeletal:     Right lower leg: Edema present.     Left lower leg: Edema present.  Skin:    General: Skin is warm and dry.  Neurological:     Mental Status: She is alert. She is disoriented.          Scheduled Medications:   enoxaparin  (LOVENOX ) injection  60 mg Subcutaneous Q24H   escitalopram   10 mg Oral Daily   folic acid   1 mg Oral Daily   LORazepam   0-4 mg Intravenous Q12H   multivitamin with minerals  1 tablet Oral Daily   nystatin    Topical BID   thiamine   100 mg Oral Daily   Or   thiamine   100 mg Intravenous Daily    Continuous Infusions:  cefTRIAXone  (ROCEPHIN )  IV 2 g (08/18/24 0051)    PRN Medications:  acetaminophen , hydrALAZINE , loperamide , LORazepam  **OR** LORazepam , LORazepam , ondansetron  (ZOFRAN ) IV, oxyCODONE   Antimicrobials from admission:  Anti-infectives (From admission, onward)    Start     Dose/Rate Route Frequency Ordered Stop   08/16/24 0100  cefTRIAXone  (ROCEPHIN ) 2 g in sodium chloride  0.9 % 100 mL IVPB        2 g 200 mL/hr over 30 Minutes Intravenous Every 24 hours 08/16/24 0057             Data Reviewed:  I have personally reviewed the following...  CBC: Recent Labs  Lab 08/15/24 1711 08/16/24 0625  WBC 10.1 10.4  HGB 14.2 13.1  HCT 43.5 39.8  MCV 91.8 92.6  PLT 248 217   Basic Metabolic Panel: Recent Labs  Lab 08/15/24 1711 08/15/24 2238 08/16/24 0507 08/17/24 0942  NA 140  --   139 140  K 4.0  --  4.2 3.7  CL 105  --  106 107  CO2 22  --  21* 22  GLUCOSE 151*  --  130* 107*  BUN 15  --  17 12  CREATININE 0.97  --  0.82 0.72  CALCIUM 9.3  --  8.8* 8.8*  MG  --  1.8  --   --   PHOS  --  2.8  --   --    GFR: Estimated Creatinine Clearance: 91.1 mL/min (  by C-G formula based on SCr of 0.72 mg/dL). Liver Function Tests: Recent Labs  Lab 08/16/24 0507  AST 38  ALT 19  ALKPHOS 99  BILITOT 0.7  PROT 6.9  ALBUMIN 3.5   No results for input(s): LIPASE, AMYLASE in the last 168 hours. Recent Labs  Lab 08/15/24 2238 08/17/24 0942  AMMONIA 19 22   Coagulation Profile: No results for input(s): INR, PROTIME in the last 168 hours. Cardiac Enzymes: Recent Labs  Lab 08/15/24 2238  CKTOTAL 101   BNP (last 3 results) Recent Labs    08/15/24 2238  PROBNP 483.0*   HbA1C: No results for input(s): HGBA1C in the last 72 hours. CBG: No results for input(s): GLUCAP in the last 168 hours. Lipid Profile: No results for input(s): CHOL, HDL, LDLCALC, TRIG, CHOLHDL, LDLDIRECT in the last 72 hours. Thyroid Function Tests: No results for input(s): TSH, T4TOTAL, FREET4, T3FREE, THYROIDAB in the last 72 hours. Anemia Panel: No results for input(s): VITAMINB12, FOLATE, FERRITIN, TIBC, IRON, RETICCTPCT in the last 72 hours. Most Recent Urinalysis On File:     Component Value Date/Time   COLORURINE YELLOW (A) 08/15/2024 2318   APPEARANCEUR CLOUDY (A) 08/15/2024 2318   APPEARANCEUR Clear 02/23/2014 2251   LABSPEC 1.019 08/15/2024 2318   LABSPEC 1.016 02/23/2014 2251   PHURINE 5.0 08/15/2024 2318   GLUCOSEU NEGATIVE 08/15/2024 2318   GLUCOSEU Negative 02/23/2014 2251   HGBUR NEGATIVE 08/15/2024 2318   BILIRUBINUR NEGATIVE 08/15/2024 2318   BILIRUBINUR Negative 02/23/2014 2251   KETONESUR NEGATIVE 08/15/2024 2318   PROTEINUR NEGATIVE 08/15/2024 2318   NITRITE NEGATIVE 08/15/2024 2318   LEUKOCYTESUR SMALL (A)  08/15/2024 2318   LEUKOCYTESUR Trace 02/23/2014 2251   Sepsis Labs: @LABRCNTIP (procalcitonin:4,lacticidven:4) Microbiology: No results found for this or any previous visit (from the past 240 hours).    Radiology Studies last 3 days: DG Tibia/Fibula Left Result Date: 08/16/2024 EXAM: VIEW(S) XRAY OF THE LEFT TIBIA AND FIBULA 08/16/2024 02:30:00 AM COMPARISON: 10/25/2020 CLINICAL HISTORY: Pain in left lower leg FINDINGS: BONES AND JOINTS: Mild narrowing of the medial joint space is seen. Mild patellofemoral degenerative changes are noted as well. No acute fracture or dislocation is seen. SOFT TISSUES: Soft tissue swelling was noted. IMPRESSION: 1. Degenerative changes without acute abnormality. Electronically signed by: Oneil Devonshire MD 08/16/2024 02:34 AM EST RP Workstation: [id number]   CT Head Wo Contrast Result Date: 08/15/2024 EXAM: CT HEAD WITHOUT CONTRAST 08/15/2024 11:21:07 PM TECHNIQUE: CT of the head was performed without the administration of intravenous contrast. Automated exposure control, iterative reconstruction, and/or weight based adjustment of the mA/kV was utilized to reduce the radiation dose to as low as reasonably achievable. COMPARISON: None available. CLINICAL HISTORY: Mental status change, unknown cause FINDINGS: BRAIN AND VENTRICLES: No acute hemorrhage. No evidence of acute infarct. No hydrocephalus. No extra-axial collection. No mass effect or midline shift. ORBITS: No acute abnormality. SINUSES: No acute abnormality. SOFT TISSUES AND SKULL: No acute soft tissue abnormality. No skull fracture. IMPRESSION: 1. No acute intracranial abnormality. Electronically signed by: Franky Crease MD 08/15/2024 11:23 PM EST RP Workstation: [id number]   US  Venous Img Lower  Left (DVT Study) Result Date: 08/15/2024 EXAM: ULTRASOUND DUPLEX OF THE LEFT LOWER EXTREMITY VEINS TECHNIQUE: Duplex ultrasound using B-mode/gray scaled imaging and Doppler spectral analysis and color flow was obtained of  the deep venous structures of the left lower extremity. COMPARISON: None available. CLINICAL HISTORY: swelling FINDINGS: The common femoral vein, femoral vein, popliteal vein, and posterior tibial vein of the left lower extremity  demonstrate normal compressibility with normal color flow and spectral analysis. IMPRESSION: 1. No evidence of DVT in the left lower extremity. Electronically signed by: Franky Crease MD 08/15/2024 09:46 PM EST RP Workstation: [id number]          Laneta Blunt, DO [facility] 08/18/2024, 2:05 PM    Dictation software may have been used to generate the above note. Typos may occur and escape review in typed/dictated notes. Please contact Dr Blunt directly for clarity if needed.  Staff may message me via secure chat in Epic  but this may not receive an immediate response,  please page me for urgent matters!  If 7PM-7AM, please contact night coverage [email or web address]

## 2024-08-18 NOTE — Plan of Care (Signed)

## 2024-08-18 NOTE — Progress Notes (Signed)
   08/18/24 0015  Assess: MEWS Score  Temp (!) 101.3 F (38.5 C)  BP 133/76  MAP (mmHg) 93  Pulse Rate (!) 115  Resp 20  SpO2 95 %  O2 Device Room Air  Assess: MEWS Score  MEWS Temp 1  MEWS Systolic 0  MEWS Pulse 2  MEWS RR 0  MEWS LOC 0  MEWS Score 3  MEWS Score Color Yellow  Assess: if the MEWS score is Yellow or Red  Were vital signs accurate and taken at a resting state? Yes  Does the patient meet 2 or more of the SIRS criteria? Yes  Does the patient have a confirmed or suspected source of infection? No  Notify: Charge Nurse/RN  Name of Charge Nurse/RN Notified [facility]  Provider Notification  Provider Name/Title Madison Lawence PEAK  Date Provider Notified 08/18/24  Time Provider Notified 1239  Method of Notification  (Secure chat)  Notification Reason Other (Comment) (Yellow MEWS, HR 115, fever 101)  Provider response No new orders  Assess: SIRS CRITERIA  SIRS Temperature  1  SIRS Respirations  0  SIRS Pulse 1  SIRS WBC 0  SIRS Score Sum  2

## 2024-08-19 DIAGNOSIS — G9341 Metabolic encephalopathy: Secondary | ICD-10-CM | POA: Diagnosis not present

## 2024-08-19 NOTE — Progress Notes (Signed)
 PROGRESS NOTE    Haley Deleon   FMW:[id number] DOB: 04-18-1956  DOA: 08/15/2024 Date of Service: 08/19/24 which is hospital day 4  PCP: Ricard Tawni KIDD, MD    Hospital course / significant events:   Haley Deleon is a 68 y.o. female with medical history significant of alcohol use disorder, dCHF, CAD, depression with anxiety, morbid obesity, ulcerative colitis, NSVT, PSVT, varicose vein in both legs, carotid artery stenosis, morbid obesity, who presents with left leg swelling and pain, confusion.   HPI:  initially presented to ED with left leg swelling and pain, and then she developed confusion in ED.  When admitting hospitalist saw patient in ED, she was alert, orientated to place and person, not to the time. She does not have pain/swelling in the right leg.  She denies fall or injury.  No fever or chills.  Per her granddaughter, patient used to drink liquor almost every day. Recently she drinks less, and last drinking was several days ago.  Patient states that she only drinks once a week recently.   11/25: admitted to hospitalist w/ acute metabolic encephalopathy EtOH withdrawal, UTI.  11/26: remains confused / hallucinating this morning. Continuing withdrawal protocols  11/27: remains confused but is more calm today. Urine culture still pending.  11/28: remains confused but is more calm today and bit more conversant but not making sense. Urine culture still pending - staff has confirmed w/ lab that it is in process today as of 05:00 11/29: more alert but remains confused. PT/OT ordered for tomorrow      Consultants:  none  Procedures/Surgeries: none      ASSESSMENT & PLAN:   Acute metabolic encephalopathy d/t EtOH withdrawal, underlying UTI is probably a complicating factor but not primary cause of encephalopathy  Frequent neurochecks, CIWA Fall precaution Tx UTI as below   UTI: (+)Proteus and Ecoli  IV Rocephin  Follow-up urine culture - pend susceptibilities     Pain and swelling of left lower leg Chronic venous stasis Ruled out DVT Supportive care   Chronic diastolic CHF (congestive heart failure)  2D echo on 02/24/2021 showed EF 60 to 65%.  Patient has leg edema, but no SOB.  Does not seem to have CHF exacerbation. Equivocal  BNP I&O Diuresis as needed   Ulcerative colitis  No active nausea vomiting or diarrhea.   Continue as needed Imodium    Intertriginous candidiasis:  In groin area. Nystatin  powder topcially   Depression Continue home Lexapro  if able to take po but ok to hold if needed     Class 3 obesity based on BMI: Body mass index is 41.5 kg/m.SABRA Significantly low or high BMI is associated with higher medical risk.  Underweight - under 18  overweight - 25 to 29 obese - 30 or more Class 1 obesity: BMI of 30.0 to 34 Class 2 obesity: BMI of 35.0 to 39 Class 3 obesity: BMI of 40.0 to 49 Super Morbid Obesity: BMI 50-59 Super-super Morbid Obesity: BMI 60+ Healthy nutrition and physical activity advised as adjunct to other disease management and risk reduction treatments    DVT prophylaxis: lovenox  IV fluids: no continuous IV fluids  Nutrition: dysphagia diet if coherent to eat Central lines / other devices: none  Code Status: FULL CODE ACP documentation reviewed:  none on file in [facility]  TOC needs: TBD Medical barriers to dispo: withdrawal. Expected medical readiness for discharge few days             Subjective / Brief ROS:  Patient much more alert but still some confused. She knows she is in hospital and is here bc of alcohol.   Family Communication: significant other at bedside     Objective Findings:  Vitals:   08/19/24 0500 08/19/24 0506 08/19/24 0830 08/19/24 1200  BP: 122/66  (!) 157/85 (!) 145/76  Pulse: 90  95 98  Resp: 20   (!) 26  Temp: 98.5 F (36.9 C)  99.7 F (37.6 C) 98.7 F (37.1 C)  TempSrc: Oral  Oral Oral  SpO2: 99%  95% 94%  Weight:  117.7 kg    Height:         Intake/Output Summary (Last 24 hours) at 08/19/2024 1622 Last data filed at 08/19/2024 0935 Gross per 24 hour  Intake 1010 ml  Output 850 ml  Net 160 ml   Filed Weights   08/17/24 0535 08/18/24 0413 08/19/24 0506  Weight: 121.2 kg 119.2 kg 117.7 kg    Examination:  Physical Exam Cardiovascular:     Rate and Rhythm: Normal rate and regular rhythm.  Pulmonary:     Effort: Pulmonary effort is normal.     Breath sounds: Normal breath sounds.  Abdominal:     General: Bowel sounds are normal.  Musculoskeletal:     Right lower leg: Edema present.     Left lower leg: Edema present.  Skin:    General: Skin is warm and dry.  Neurological:     Mental Status: She is alert. She is disoriented.          Scheduled Medications:   enoxaparin  (LOVENOX ) injection  60 mg Subcutaneous Q24H   escitalopram   10 mg Oral Daily   folic acid   1 mg Oral Daily   LORazepam   0-4 mg Intravenous Q12H   multivitamin with minerals  1 tablet Oral Daily   nystatin    Topical BID   thiamine   100 mg Oral Daily   Or   thiamine   100 mg Intravenous Daily    Continuous Infusions:  cefTRIAXone  (ROCEPHIN )  IV 2 g (08/19/24 0035)    PRN Medications:  acetaminophen , hydrALAZINE , loperamide , LORazepam , ondansetron  (ZOFRAN ) IV, oxyCODONE   Antimicrobials from admission:  Anti-infectives (From admission, onward)    Start     Dose/Rate Route Frequency Ordered Stop   08/16/24 0100  cefTRIAXone  (ROCEPHIN ) 2 g in sodium chloride  0.9 % 100 mL IVPB        2 g 200 mL/hr over 30 Minutes Intravenous Every 24 hours 08/16/24 0057             Data Reviewed:  I have personally reviewed the following...  CBC: Recent Labs  Lab 08/15/24 1711 08/16/24 0625  WBC 10.1 10.4  HGB 14.2 13.1  HCT 43.5 39.8  MCV 91.8 92.6  PLT 248 217   Basic Metabolic Panel: Recent Labs  Lab 08/15/24 1711 08/15/24 2238 08/16/24 0507 08/17/24 0942  NA 140  --  139 140  K 4.0  --  4.2 3.7  CL 105  --  106 107   CO2 22  --  21* 22  GLUCOSE 151*  --  130* 107*  BUN 15  --  17 12  CREATININE 0.97  --  0.82 0.72  CALCIUM 9.3  --  8.8* 8.8*  MG  --  1.8  --   --   PHOS  --  2.8  --   --    GFR: Estimated Creatinine Clearance: 90.5 mL/min (by C-G formula based on SCr of 0.72 mg/dL).  Liver Function Tests: Recent Labs  Lab 08/16/24 0507  AST 38  ALT 19  ALKPHOS 99  BILITOT 0.7  PROT 6.9  ALBUMIN 3.5   No results for input(s): LIPASE, AMYLASE in the last 168 hours. Recent Labs  Lab 08/15/24 2238 08/17/24 0942  AMMONIA 19 22   Coagulation Profile: No results for input(s): INR, PROTIME in the last 168 hours. Cardiac Enzymes: Recent Labs  Lab 08/15/24 2238  CKTOTAL 101   BNP (last 3 results) Recent Labs    08/15/24 2238  PROBNP 483.0*   HbA1C: No results for input(s): HGBA1C in the last 72 hours. CBG: No results for input(s): GLUCAP in the last 168 hours. Lipid Profile: No results for input(s): CHOL, HDL, LDLCALC, TRIG, CHOLHDL, LDLDIRECT in the last 72 hours. Thyroid Function Tests: No results for input(s): TSH, T4TOTAL, FREET4, T3FREE, THYROIDAB in the last 72 hours. Anemia Panel: No results for input(s): VITAMINB12, FOLATE, FERRITIN, TIBC, IRON, RETICCTPCT in the last 72 hours. Most Recent Urinalysis On File:     Component Value Date/Time   COLORURINE YELLOW (A) 08/15/2024 2318   APPEARANCEUR CLOUDY (A) 08/15/2024 2318   APPEARANCEUR Clear 02/23/2014 2251   LABSPEC 1.019 08/15/2024 2318   LABSPEC 1.016 02/23/2014 2251   PHURINE 5.0 08/15/2024 2318   GLUCOSEU NEGATIVE 08/15/2024 2318   GLUCOSEU Negative 02/23/2014 2251   HGBUR NEGATIVE 08/15/2024 2318   BILIRUBINUR NEGATIVE 08/15/2024 2318   BILIRUBINUR Negative 02/23/2014 2251   KETONESUR NEGATIVE 08/15/2024 2318   PROTEINUR NEGATIVE 08/15/2024 2318   NITRITE NEGATIVE 08/15/2024 2318   LEUKOCYTESUR SMALL (A) 08/15/2024 2318   LEUKOCYTESUR Trace 02/23/2014 2251    Sepsis Labs: @LABRCNTIP (procalcitonin:4,lacticidven:4) Microbiology: Recent Results (from the past 240 hours)  Urine Culture (for pregnant, neutropenic or urologic patients or patients with an indwelling urinary catheter)     Status: Abnormal (Preliminary result)   Collection Time: 08/16/24  5:07 AM   Specimen: Urine, Clean Catch  Result Value Ref Range Status   Specimen Description   Final    URINE, CLEAN CATCH Performed at [facility], [address] [place], [place] [address]    Special Requests   Final    NONE Performed at [facility], [address], [place], [place] [address]    Culture (A)  Final    30,000 COLONIES/mL PROTEUS MIRABILIS >=100,000 COLONIES/mL ESCHERICHIA COLI SUSCEPTIBILITIES TO FOLLOW Performed at [facility], [address], [place], [place] [address]    Report Status PENDING  Incomplete      Radiology Studies last 3 days: DG Tibia/Fibula Left Result Date: 08/16/2024 EXAM: VIEW(S) XRAY OF THE LEFT TIBIA AND FIBULA 08/16/2024 02:30:00 AM COMPARISON: 10/25/2020 CLINICAL HISTORY: Pain in left lower leg FINDINGS: BONES AND JOINTS: Mild narrowing of the medial joint space is seen. Mild patellofemoral degenerative changes are noted as well. No acute fracture or dislocation is seen. SOFT TISSUES: Soft tissue swelling was noted. IMPRESSION: 1. Degenerative changes without acute abnormality. Electronically signed by: Oneil Devonshire MD 08/16/2024 02:34 AM EST RP Workstation: [id number]   CT Head Wo Contrast Result Date: 08/15/2024 EXAM: CT HEAD WITHOUT CONTRAST 08/15/2024 11:21:07 PM TECHNIQUE: CT of the head was performed without the administration of intravenous contrast. Automated exposure control, iterative reconstruction, and/or weight based adjustment of the mA/kV was utilized to reduce the radiation dose to as low as reasonably achievable. COMPARISON: None available. CLINICAL HISTORY: Mental status change, unknown cause FINDINGS:  BRAIN AND VENTRICLES: No acute hemorrhage. No evidence of acute infarct. No  hydrocephalus. No extra-axial collection. No mass effect or midline shift. ORBITS: No acute abnormality. SINUSES: No acute abnormality. SOFT TISSUES AND SKULL: No acute soft tissue abnormality. No skull fracture. IMPRESSION: 1. No acute intracranial abnormality. Electronically signed by: Franky Crease MD 08/15/2024 11:23 PM EST RP Workstation: [id number]   US  Venous Img Lower  Left (DVT Study) Result Date: 08/15/2024 EXAM: ULTRASOUND DUPLEX OF THE LEFT LOWER EXTREMITY VEINS TECHNIQUE: Duplex ultrasound using B-mode/gray scaled imaging and Doppler spectral analysis and color flow was obtained of the deep venous structures of the left lower extremity. COMPARISON: None available. CLINICAL HISTORY: swelling FINDINGS: The common femoral vein, femoral vein, popliteal vein, and posterior tibial vein of the left lower extremity demonstrate normal compressibility with normal color flow and spectral analysis. IMPRESSION: 1. No evidence of DVT in the left lower extremity. Electronically signed by: Franky Crease MD 08/15/2024 09:46 PM EST RP Workstation: [id number]          Laneta Blunt, DO [facility] 08/19/2024, 4:22 PM    Dictation software may have been used to generate the above note. Typos may occur and escape review in typed/dictated notes. Please contact Dr Blunt directly for clarity if needed.  Staff may message me via secure chat in Epic  but this may not receive an immediate response,  please page me for urgent matters!  If 7PM-7AM, please contact night coverage [email or web address]

## 2024-08-19 NOTE — Plan of Care (Signed)

## 2024-08-19 NOTE — Plan of Care (Signed)
  Problem: Clinical Measurements: Goal: Ability to maintain clinical measurements within normal limits will improve Outcome: Progressing Goal: Diagnostic test results will improve Outcome: Progressing   Problem: Activity: Goal: Risk for activity intolerance will decrease Outcome: Progressing   Problem: Nutrition: Goal: Adequate nutrition will be maintained Outcome: Progressing   Problem: Coping: Goal: Level of anxiety will decrease Outcome: Progressing   Problem: Elimination: Goal: Will not experience complications related to urinary retention Outcome: Progressing   Problem: Pain Managment: Goal: General experience of comfort will improve and/or be controlled Outcome: Progressing   Problem: Safety: Goal: Ability to remain free from injury will improve Outcome: Progressing

## 2024-08-20 ENCOUNTER — Inpatient Hospital Stay

## 2024-08-20 DIAGNOSIS — G9341 Metabolic encephalopathy: Secondary | ICD-10-CM | POA: Diagnosis not present

## 2024-08-20 LAB — CBC WITH DIFFERENTIAL/PLATELET
Abs Immature Granulocytes: 0.03 K/uL (ref 0.00–0.07)
Basophils Absolute: 0.1 K/uL (ref 0.0–0.1)
Basophils Relative: 1 %
Eosinophils Absolute: 0.2 K/uL (ref 0.0–0.5)
Eosinophils Relative: 3 %
HCT: 44.8 % (ref 36.0–46.0)
Hemoglobin: 14.7 g/dL (ref 12.0–15.0)
Immature Granulocytes: 0 %
Lymphocytes Relative: 14 %
Lymphs Abs: 1.1 K/uL (ref 0.7–4.0)
MCH: 30.1 pg (ref 26.0–34.0)
MCHC: 32.8 g/dL (ref 30.0–36.0)
MCV: 91.6 fL (ref 80.0–100.0)
Monocytes Absolute: 1.1 K/uL — ABNORMAL HIGH (ref 0.1–1.0)
Monocytes Relative: 15 %
Neutro Abs: 4.8 K/uL (ref 1.7–7.7)
Neutrophils Relative %: 67 %
Platelets: 221 K/uL (ref 150–400)
RBC: 4.89 MIL/uL (ref 3.87–5.11)
RDW: 13.5 % (ref 11.5–15.5)
WBC: 7.3 K/uL (ref 4.0–10.5)
nRBC: 0 % (ref 0.0–0.2)

## 2024-08-20 LAB — URINE CULTURE: Culture: 30000 — AB

## 2024-08-20 LAB — BASIC METABOLIC PANEL WITH GFR
Anion gap: 12 (ref 5–15)
BUN: 16 mg/dL (ref 8–23)
CO2: 25 mmol/L (ref 22–32)
Calcium: 9 mg/dL (ref 8.9–10.3)
Chloride: 105 mmol/L (ref 98–111)
Creatinine, Ser: 0.9 mg/dL (ref 0.44–1.00)
GFR, Estimated: >60 mL/min (ref >60)
Glucose, Bld: 127 mg/dL — ABNORMAL HIGH (ref 70–99)
Potassium: 4 mmol/L (ref 3.5–5.1)
Sodium: 142 mmol/L (ref 135–145)

## 2024-08-20 LAB — RESP PANEL BY RT-PCR (RSV, FLU A&B, COVID)  RVPGX2
Influenza A by PCR: NEGATIVE
Influenza B by PCR: NEGATIVE
Resp Syncytial Virus by PCR: NEGATIVE
SARS Coronavirus 2 by RT PCR: NEGATIVE

## 2024-08-20 MED ORDER — ENSURE PLUS HIGH PROTEIN PO LIQD
237.0000 mL | Freq: Two times a day (BID) | ORAL | Status: DC
  Administered 2024-08-21 – 2024-08-25 (×4): 237 mL via ORAL

## 2024-08-20 MED ORDER — FUROSEMIDE 10 MG/ML IJ SOLN
40.0000 mg | Freq: Once | INTRAMUSCULAR | Status: AC
  Administered 2024-08-20: 40 mg via INTRAVENOUS
  Filled 2024-08-20: qty 4

## 2024-08-20 MED ORDER — AZITHROMYCIN 250 MG PO TABS
500.0000 mg | ORAL_TABLET | Freq: Every day | ORAL | Status: DC
  Administered 2024-08-20 – 2024-08-23 (×4): 500 mg via ORAL
  Filled 2024-08-20 (×4): qty 2

## 2024-08-20 NOTE — Evaluation (Addendum)
 Occupational Therapy Evaluation Patient Details Name: Haley Deleon MRN: [id number] DOB: Mar 09, 1956 Today's Date: 08/20/2024   History of Present Illness   Pt is a 68 y.o. female who presents with left leg swelling and pain, confusion. MD assessment: Acute metabolic encephalopathy, UTI and possible alcohol withdrawal. PMH of alcohol use disorder, dCHF, CAD, depression with anxiety, morbid obesity, ulcerative colitis, NSVT, PSVT, varicose vein in both legs, carotid artery stenosis, morbid obesity     Clinical Impressions Pt was seen for OT evaluation this date. PLOF information was provided via phone from granddaughter to PT who reports pt lives at home with her husband and 56 year old grandchild who both assist pt as needed with tasks. They report at baseline she ambulates with a SPC or uses the cart in the grocery store. She endorses pt has had multiple falls a month and that family is not able to provide assist level needed by pt.  Pt presents with deficits in cognition, alertness, strength, balance, activity tolerance and safety awareness, affecting safe and optimal ADL completion. She is lethargic during session, but able to state her name and otherwise unable to provide orientation information. She is unable to follow commands consistently and noted with little to no initiation of active ROM during BUE/BLE assessment with significant weakness noted. BUEs are notably stiff and hard to move past ~90 degrees of shoulder flexion. She required total assist x2 for all bed mobility tasks this date with max multimodal cueing. Total assist x2 to maintain seated balance at EOB and for repositioning to [facility] as well as lifting her head for pillow placement. Pt would benefit from skilled OT services to address noted impairments and functional limitations to maximize safety and independence while minimizing future risk of falls, injury, and readmission. Do anticipate the need for follow up OT services upon  acute hospital DC.      If plan is discharge home, recommend the following:   Two people to help with walking and/or transfers;A lot of help with bathing/dressing/bathroom;Two people to help with bathing/dressing/bathroom     Functional Status Assessment   Patient has had a recent decline in their functional status and demonstrates the ability to make significant improvements in function in a reasonable and predictable amount of time.     Equipment Recommendations   Other (comment) (defer to next  venue)     Recommendations for Other Services         Precautions/Restrictions   Precautions Precautions: Fall Recall of Precautions/Restrictions: Impaired Restrictions Weight Bearing Restrictions Per Provider Order: No     Mobility Bed Mobility Overal bed mobility: Needs Assistance Bed Mobility: Rolling, Supine to Sit, Sit to Supine Rolling: Total assist, +2 for physical assistance   Supine to sit: Total assist, +2 for physical assistance Sit to supine: Total assist, +2 for physical assistance   General bed mobility comments: requires constant/frequent multimodal cues for intiation, sequencing and technique, will minimally respond to questions and then not actively move/attempt movement upon commands; required total assist x2 with all bed mobility tasks and to maintain seated balance at EOB    Transfers                   General transfer comment: deferred d/t poor seated balance/tolerance      Balance Overall balance assessment: Needs assistance   Sitting balance-Leahy Scale: Zero Sitting balance - Comments: pt total x2 to maintain seated balance Postural control: Posterior lean  ADL either performed or assessed with clinical judgement   ADL Overall ADL's : Needs assistance/impaired                             Toileting- Clothing Manipulation and Hygiene: Total assistance Toileting -  Clothing Manipulation Details (indicate cue type and reason): for purewick management during session             Vision         Perception         Praxis         Pertinent Vitals/Pain Pain Assessment Pain Assessment: PAINAD Breathing: normal Negative Vocalization: repeated troubled calling out, loud moaning/groaning, crying Facial Expression: smiling or inexpressive Body Language: relaxed Consolability: distracted or reassured by voice/touch PAINAD Score: 3 Pain Intervention(s): Monitored during session, Limited activity within patient's tolerance, Repositioned     Extremity/Trunk Assessment Upper Extremity Assessment Upper Extremity Assessment: Generalized weakness (noted with increased stiffness and no active movement of extermities unless initiated by therapists)   Lower Extremity Assessment Lower Extremity Assessment: Generalized weakness   Cervical / Trunk Assessment Cervical / Trunk Assessment: Normal (weakness, unable to lift head for pillow positioning)   Communication Communication Communication: Impaired Factors Affecting Communication: Reduced clarity of speech;Difficulty expressing self   Cognition Arousal: Lethargic Behavior During Therapy: Flat affect, Lability Cognition: Cognition impaired   Orientation impairments: Place, Time, Situation     Attention impairment (select first level of impairment): Sustained attention, Selective attention Executive functioning impairment (select all impairments): Initiation, Organization, Sequencing                   Following commands: Impaired Following commands impaired: Follows one step commands inconsistently     Cueing  General Comments   Cueing Techniques: Verbal cues;Tactile cues;Visual cues;Gestural cues      Exercises Other Exercises Other Exercises: Edu on role of OT in acute setting.   Shoulder Instructions      Home Living Family/patient expects to be discharged to:: Private  residence Living Arrangements: Children;Spouse/significant other Available Help at Discharge: Family;Available PRN/intermittently Type of Home: House Home Access: Stairs to enter [employer] of Steps: 1 Entrance Stairs-Rails: None Home Layout: Two level;Able to live on main level with bedroom/bathroom     Bathroom Shower/Tub: Tub/shower unit;Walk-in shower   Bathroom Toilet: Standard Bathroom Accessibility: No   Home Equipment: Cane - single point   Additional Comments: does not have any additional equipment      Prior Functioning/Environment Prior Level of Function : Independent/Modified Independent;Needs assist       Physical Assist : Mobility (physical);ADLs (physical) Mobility (physical): Transfers;Gait ADLs (physical): Dressing;IADLs Mobility Comments: spoke to grand-daughter Kai) on phone and she mentioned that pt. recieves help from husband although he is not able to provide adequate assistance around the house stating that she has several falls a month and is generally not a safe at home due to the condition of the house; additonally states that she uses a [facility] for mobility and uses the cart at the grocery store for grocery shopping ADLs Comments: grand-daugther states that she recieves help from husband and 83 year old grandchild with getting dressed, occasionally bathing and other ADLs/iADLs    OT Problem List: Decreased strength;Decreased activity tolerance;Impaired balance (sitting and/or standing);Decreased safety awareness;Decreased cognition   OT Treatment/Interventions: Self-care/ADL training;Therapeutic exercise;Therapeutic activities;DME and/or AE instruction;Patient/family education;Balance training      OT Goals(Current goals can be found in the care plan section)  Acute Rehab OT Goals OT Goal Formulation: Patient unable to participate in goal setting Time For Goal Achievement: 09/03/24 Potential to Achieve Goals: Fair ADL Goals Pt Will  Perform Eating: bed level;with supervision Pt Will Perform Grooming: with min assist;sitting (at EOB x5 mins with CGA/Min A for balance to promote improve seated balance/tolerance) Pt/caregiver will Perform Home Exercise Program: Increased strength;Increased ROM;Both right and left upper extremity;With minimal assist   OT Frequency:  Min 2X/week    Co-evaluation PT/OT/SLP Co-Evaluation/Treatment: Yes Reason for Co-Treatment: Complexity of the patient's impairments (multi-system involvement);Necessary to address cognition/behavior during functional activity;For patient/therapist safety PT goals addressed during session: Mobility/safety with mobility;Strengthening/ROM OT goals addressed during session: ADL's and self-care;Proper use of Adaptive equipment and DME;Strengthening/ROM      AM-PAC OT 6 Clicks Daily Activity     Outcome Measure Help from another person eating meals?: A Lot Help from another person taking care of personal grooming?: Total Help from another person toileting, which includes using toliet, bedpan, or urinal?: Total Help from another person bathing (including washing, rinsing, drying)?: Total Help from another person to put on and taking off regular upper body clothing?: Total Help from another person to put on and taking off regular lower body clothing?: Total 6 Click Score: 7   End of Session Nurse Communication: Mobility status  Activity Tolerance: Patient limited by fatigue;Other (comment) (cognition/follow commands) Patient left: in bed;with call bell/phone within reach;with bed alarm set  OT Visit Diagnosis: Other abnormalities of gait and mobility (R26.89);Muscle weakness (generalized) (M62.81)                Time: 8587-8566 OT Time Calculation (min): 21 min Charges:  OT General Charges $OT Visit: 1 Visit OT Evaluation $OT Eval Moderate Complexity: 1 Mod Leonor Darnell Chrismon, OTR/L  08/20/24, 4:58 PM  Latiesha Harada E Chrismon 08/20/2024, 4:54 PM

## 2024-08-20 NOTE — Evaluation (Signed)
 Physical Therapy Evaluation Patient Details Name: Haley Deleon MRN: [id number] DOB: [birth year] Today's Date: 08/20/2024  History of Present Illness  Haley Deleon is a 68 y.o. female with medical history significant of alcohol use disorder, dCHF, CAD, depression with anxiety, morbid obesity, ulcerative colitis, NSVT, PSVT, varicose vein in both legs, carotid artery stenosis, morbid obesity, who presents with left leg swelling and pain, confusion. Pt. initially presented to ED with left leg swelling and pain, and then she developed confusion in ED.  When admitting hospitalist saw patient in ED, she was alert, orientated to place and person, not to the time. She does not have pain/swelling in the right leg.  She denies fall or injury.  No fever or chills.  Per her granddaughter, patient used to drink liquor almost every day. Recently she drinks less, and last drinking was several days ago.  Patient states that she only drinks once a week recently.  Clinical Impression  Patient noted to be in supine position at PT arrival in room, for an initial PT evaluation due to a decline in functional status, with baseline mobility reported as modI with [facility] but has history of several falls a month , and currently requiring totalA x 2 for all mobility. The patient A&O x 1 ultimately difficulty to assess due to lathery and limited ability to respond to verbal cues and questions. The patient resides in a house and lives with spouse and 12 year on grandchild with limited family/friend support. There are no STE inside the residence and pt is able to live in [place]. The overall clinical impression is that the patient presents with severe mobility limitations secondary to acute medical complications. Recommended skilled PT will address safety, mobility, and discharge planning. PT recommendation to d/c patient to SNF upon medical clearance.        If plan is discharge home, recommend the following: Two people to help  with walking and/or transfers;Two people to help with bathing/dressing/bathroom;Help with stairs or ramp for entrance;Assist for transportation;Assistance with feeding;Assistance with cooking/housework;Direct supervision/assist for financial management;Supervision due to cognitive status   Can travel by private vehicle   No    Equipment Recommendations Other (comment) (TBD)  Recommendations for Other Services       Functional Status Assessment Patient has had a recent decline in their functional status and/or demonstrates limited ability to make significant improvements in function in a reasonable and predictable amount of time     Precautions / Restrictions Restrictions Weight Bearing Restrictions Per Provider Order: No      Mobility  Bed Mobility Overal bed mobility: Needs Assistance Bed Mobility: Rolling, Supine to Sit, Sit to Supine Rolling: Total assist, +2 for physical assistance   Supine to sit: Total assist, +2 for physical assistance Sit to supine: Total assist, +2 for physical assistance   General bed mobility comments: Pt provided with with vc to use hand rails but ultimately unable to respond physically with minimal response verbally; additionally pt not able to sit EOB without x2 totalA to maintain sitting balance    Transfers                        Ambulation/Gait                  Stairs            Wheelchair Mobility     Tilt Bed    Modified Rankin (Stroke Patients Only)  Balance Overall balance assessment: Needs assistance   Sitting balance-Leahy Scale: Zero Sitting balance - Comments: Pt unable to provide physical assist to maintain sitting posture Postural control: Posterior lean                                   Pertinent Vitals/Pain Pain Assessment Pain Assessment: PAINAD Breathing: normal Negative Vocalization: repeated troubled calling out, loud moaning/groaning, crying Facial Expression:  smiling or inexpressive Body Language: relaxed Consolability: distracted or reassured by voice/touch PAINAD Score: 3 Pain Intervention(s): Limited activity within patient's tolerance, Monitored during session, Repositioned    Home Living Family/patient expects to be discharged to:: Private residence Living Arrangements: Children;Spouse/significant other Available Help at Discharge: Family;Available PRN/intermittently Type of Home: House Home Access: Stairs to enter Entrance Stairs-Rails: None Entrance Stairs-Number of Steps: 1   Home Layout: Two level;Able to live on main level with bedroom/bathroom Home Equipment: Cane - single point Additional Comments: does not have any additional equipment    Prior Function Prior Level of Function : Independent/Modified Independent;Needs assist       Physical Assist : Mobility (physical);ADLs (physical) Mobility (physical): Transfers;Gait ADLs (physical): Dressing;IADLs Mobility Comments: spoke to grand-daughter Kai) on phone and she mentioned that pt. recieves help from husband although he is not able to provide adequate assistance around the house stating that she has several falls a month and is generally not a safe at home due to the condition of the house; additonally states that she uses a [facility] for mobility and uses the [employer] at the grocery store for grocery shopping as baseline ADLs Comments: grand-daugther states that she recieves help from husband and 67 year old grandchild with getting dressed, occasionally bathing and other ADLs/iADLs     Extremity/Trunk Assessment   Upper Extremity Assessment Upper Extremity Assessment: Generalized weakness (little to no initiation of movement with commands; limited ability to squeeze fingers for grip strength assesment)    Lower Extremity Assessment Lower Extremity Assessment: Generalized weakness (little to no initiation of LE with commands)    Cervical / Trunk Assessment Cervical / Trunk  Assessment: Normal (unable to lift head for pillow/proper head positioning)  Communication   Communication Communication: Impaired Factors Affecting Communication: Reduced clarity of speech;Difficulty expressing self    Cognition Arousal: Lethargic Behavior During Therapy: Restless, Flat affect, Lability   PT - Cognitive impairments: Orientation, Awareness, Memory, Attention, Initiation, Sequencing, Difficult to assess Difficult to assess due to: Impaired communication, Level of arousal Orientation impairments: Place, Time, Situation                     Following commands: Impaired Following commands impaired: Follows one step commands inconsistently     Cueing Cueing Techniques: Verbal cues, Tactile cues, Visual cues, Gestural cues     General Comments      Exercises     Assessment/Plan    PT Assessment Patient needs continued PT services  PT Problem List Decreased strength;Decreased range of motion;Decreased activity tolerance;Decreased balance;Decreased mobility;Decreased coordination       PT Treatment Interventions DME instruction;Stair training;Functional mobility training;Therapeutic activities;Therapeutic exercise;Patient/family education;Balance training;Neuromuscular re-education;Gait training    PT Goals (Current goals can be found in the Care Plan section)  Acute Rehab PT Goals PT Goal Formulation: Patient unable to participate in goal setting    Frequency Min 2X/week     Co-evaluation PT/OT/SLP Co-Evaluation/Treatment: Yes Reason for Co-Treatment: Complexity of the patient's impairments (multi-system involvement);Necessary to  address cognition/behavior during functional activity;For patient/therapist safety PT goals addressed during session: Mobility/safety with mobility;Strengthening/ROM OT goals addressed during session: ADL's and self-care;Proper use of Adaptive equipment and DME;Strengthening/ROM       AM-PAC PT 6 Clicks Mobility   Outcome Measure Help needed turning from your back to your side while in a flat bed without using bedrails?: Total Help needed moving from lying on your back to sitting on the side of a flat bed without using bedrails?: Total Help needed moving to and from a bed to a chair (including a wheelchair)?: Total Help needed standing up from a chair using your arms (e.g., wheelchair or bedside chair)?: Total Help needed to walk in hospital room?: Total Help needed climbing 3-5 steps with a railing? : Total 6 Click Score: 6    End of Session   Activity Tolerance: Patient limited by lethargy Patient left: in bed;with call bell/phone within reach;with bed alarm set Nurse Communication: Mobility status PT Visit Diagnosis: Muscle weakness (generalized) (M62.81);History of falling (Z91.81);Difficulty in walking, not elsewhere classified (R26.2);Other symptoms and signs involving the nervous system (R29.898)    Time: 8587-8567 PT Time Calculation (min) (ACUTE ONLY): 20 min   Charges:   PT Evaluation $PT Eval Low Complexity: 1 Low            Sherlean Lesches DPT, PT    Gates Jividen A Towanna Avery 08/20/2024, 3:08 PM

## 2024-08-20 NOTE — Progress Notes (Signed)
 PROGRESS NOTE    Haley Deleon   FMW:[id number] DOB: [birth year]  DOA: 08/15/2024 Date of Service: 08/20/24 which is hospital day 5  PCP: Ricard Tawni KIDD, MD    Hospital course / significant events:   Haley Deleon is a 68 y.o. female with medical history significant of alcohol use disorder, dCHF, CAD, depression with anxiety, morbid obesity, ulcerative colitis, NSVT, PSVT, varicose vein in both legs, carotid artery stenosis, morbid obesity, who presents with left leg swelling and pain, confusion.   HPI:  initially presented to ED with left leg swelling and pain, and then she developed confusion in ED.  When admitting hospitalist saw patient in ED, she was alert, orientated to place and person, not to the time. She does not have pain/swelling in the right leg.  She denies fall or injury.  No fever or chills.  Per her granddaughter, patient used to drink liquor almost every day. Recently she reportedly drinks less, and last drinking was several days ago.   11/25: admitted to hospitalist w/ acute metabolic encephalopathy EtOH withdrawal, UTI.  11/26: remains confused / hallucinating this morning. Continuing withdrawal protocols  11/27: remains confused but is more calm today. Urine culture still pending.  11/28: remains confused but is more calm today and bit more conversant but not making sense. Urine culture still pending - staff has confirmed w/ lab that it is in process today as of 05:00 11/29: more alert but remains confused. PT/OT ordered for tomorrow. UCX pend susceptibilities  11/30: Proteus and Ecoli both sensitive to rocephin . Febrile this AM so will leave on abx, CXR read as possible R mid-lung atelectasis / early infection, also vascular congestion. Gave dose Lasix and will add azithro for possible PNA   Consultants:  none  Procedures/Surgeries: none      ASSESSMENT & PLAN:   Acute metabolic encephalopathy d/t EtOH withdrawal, underlying UTI is probably a  complicating factor but not primary cause of encephalopathy  Frequent neurochecks, CIWA Fall precaution Tx UTI as below   UTI: (+)Proteus and Ecoli  IV Rocephin  5-7d total therapy  Fever Abnormal CXR clinically significant, not definitive but possible early PNA Incentive spirometer  Added azithromycin    Pain and swelling of left lower leg Chronic venous stasis Ruled out DVT Supportive care   Chronic diastolic CHF (congestive heart failure)  2D echo on 02/24/2021 showed EF 60 to 65%.  Patient has leg edema, but no SOB.  Does not seem to have CHF exacerbation. Equivocal  BNP I&O Diuresis as needed - gave lasix IV x1 today    Ulcerative colitis  No active nausea vomiting or diarrhea.   Continue as needed Imodium    Intertriginous candidiasis:  In groin area. Nystatin  powder topcially   Depression Continue home Lexapro  if able to take po but ok to hold if needed     Class 3 obesity based on BMI: Body mass index is 41.5 kg/m.SABRA Significantly low or high BMI is associated with higher medical risk.  Underweight - under 18  overweight - 25 to 29 obese - 30 or more Class 1 obesity: BMI of 30.0 to 34 Class 2 obesity: BMI of 35.0 to 39 Class 3 obesity: BMI of 40.0 to 49 Super Morbid Obesity: BMI 50-59 Super-super Morbid Obesity: BMI 60+ Healthy nutrition and physical activity advised as adjunct to other disease management and risk reduction treatments    DVT prophylaxis: lovenox  IV fluids: no continuous IV fluids  Nutrition: dysphagia diet if coherent to eat Central  lines / other devices: none  Code Status: FULL CODE ACP documentation reviewed:  none on file in [facility]  [facility] needs: SNF Medical barriers to dispo: withdrawal. Expected medical readiness for discharge few days             Subjective / Brief ROS:  Patient much more alert but still some confused. Can answer person / time orientation questions a bit better today but not in any detail and cannot  answer location / situation   Family Communication: significant other at bedside     Objective Findings:  Vitals:   08/20/24 0759 08/20/24 0810 08/20/24 1008 08/20/24 1242  BP: (!) 144/74   108/65  Pulse: 98   94  Resp: (!) 21   (!) 24  Temp: (!) 102.7 F (39.3 C)  (!) 97.5 F (36.4 C) 98.5 F (36.9 C)  TempSrc: Oral  Oral   SpO2: 90% 92%  90%  Weight:      Height:        Intake/Output Summary (Last 24 hours) at 08/20/2024 1632 Last data filed at 08/20/2024 1419 Gross per 24 hour  Intake 580 ml  Output 1400 ml  Net -820 ml   Filed Weights   08/18/24 0413 08/19/24 0506 08/20/24 0500  Weight: 119.2 kg 117.7 kg 119.9 kg    Examination:  Physical Exam Cardiovascular:     Rate and Rhythm: Normal rate and regular rhythm.  Pulmonary:     Effort: Pulmonary effort is normal.     Breath sounds: Normal breath sounds.  Abdominal:     General: Bowel sounds are normal.  Musculoskeletal:     Right lower leg: Edema present.     Left lower leg: Edema present.  Skin:    General: Skin is warm and dry.  Neurological:     Mental Status: She is alert. She is disoriented.          Scheduled Medications:   azithromycin  500 mg Oral Daily   enoxaparin  (LOVENOX ) injection  60 mg Subcutaneous Q24H   escitalopram   10 mg Oral Daily   folic acid   1 mg Oral Daily   multivitamin with minerals  1 tablet Oral Daily   nystatin    Topical BID   thiamine   100 mg Oral Daily   Or   thiamine   100 mg Intravenous Daily    Continuous Infusions:  cefTRIAXone  (ROCEPHIN )  IV 2 g (08/20/24 0035)    PRN Medications:  acetaminophen , hydrALAZINE , loperamide , LORazepam , ondansetron  (ZOFRAN ) IV, oxyCODONE   Antimicrobials from admission:  Anti-infectives (From admission, onward)    Start     Dose/Rate Route Frequency Ordered Stop   08/20/24 1730  azithromycin (ZITHROMAX) tablet 500 mg        500 mg Oral Daily 08/20/24 1632 08/25/24 0959   08/16/24 0100  cefTRIAXone  (ROCEPHIN ) 2 g in  sodium chloride  0.9 % 100 mL IVPB        2 g 200 mL/hr over 30 Minutes Intravenous Every 24 hours 08/16/24 0057             Data Reviewed:  I have personally reviewed the following...  CBC: Recent Labs  Lab 08/15/24 1711 08/16/24 0625 08/20/24 0832  WBC 10.1 10.4 7.3  NEUTROABS  --   --  4.8  HGB 14.2 13.1 14.7  HCT 43.5 39.8 44.8  MCV 91.8 92.6 91.6  PLT 248 217 221   Basic Metabolic Panel: Recent Labs  Lab 08/15/24 1711 08/15/24 2238 08/16/24 0507 08/17/24 0942 08/20/24  0832  NA 140  --  139 140 142  K 4.0  --  4.2 3.7 4.0  CL 105  --  106 107 105  CO2 22  --  21* 22 25  GLUCOSE 151*  --  130* 107* 127*  BUN 15  --  17 12 16   CREATININE 0.97  --  0.82 0.72 0.90  CALCIUM 9.3  --  8.8* 8.8* 9.0  MG  --  1.8  --   --   --   PHOS  --  2.8  --   --   --    GFR: Estimated Creatinine Clearance: 81.3 mL/min (by C-G formula based on SCr of 0.9 mg/dL). Liver Function Tests: Recent Labs  Lab 08/16/24 0507  AST 38  ALT 19  ALKPHOS 99  BILITOT 0.7  PROT 6.9  ALBUMIN 3.5   No results for input(s): LIPASE, AMYLASE in the last 168 hours. Recent Labs  Lab 08/15/24 2238 08/17/24 0942  AMMONIA 19 22   Coagulation Profile: No results for input(s): INR, PROTIME in the last 168 hours. Cardiac Enzymes: Recent Labs  Lab 08/15/24 2238  CKTOTAL 101   BNP (last 3 results) Recent Labs    08/15/24 2238  PROBNP 483.0*   HbA1C: No results for input(s): HGBA1C in the last 72 hours. CBG: No results for input(s): GLUCAP in the last 168 hours. Lipid Profile: No results for input(s): CHOL, HDL, LDLCALC, TRIG, CHOLHDL, LDLDIRECT in the last 72 hours. Thyroid Function Tests: No results for input(s): TSH, T4TOTAL, FREET4, T3FREE, THYROIDAB in the last 72 hours. Anemia Panel: No results for input(s): VITAMINB12, FOLATE, FERRITIN, TIBC, IRON, RETICCTPCT in the last 72 hours. Most Recent Urinalysis On File:      Component Value Date/Time   COLORURINE YELLOW (A) 08/15/2024 2318   APPEARANCEUR CLOUDY (A) 08/15/2024 2318   APPEARANCEUR Clear 02/23/2014 2251   LABSPEC 1.019 08/15/2024 2318   LABSPEC 1.016 02/23/2014 2251   PHURINE 5.0 08/15/2024 2318   GLUCOSEU NEGATIVE 08/15/2024 2318   GLUCOSEU Negative 02/23/2014 2251   HGBUR NEGATIVE 08/15/2024 2318   BILIRUBINUR NEGATIVE 08/15/2024 2318   BILIRUBINUR Negative 02/23/2014 2251   KETONESUR NEGATIVE 08/15/2024 2318   PROTEINUR NEGATIVE 08/15/2024 2318   NITRITE NEGATIVE 08/15/2024 2318   LEUKOCYTESUR SMALL (A) 08/15/2024 2318   LEUKOCYTESUR Trace 02/23/2014 2251   Sepsis Labs: @LABRCNTIP (procalcitonin:4,lacticidven:4) Microbiology: Recent Results (from the past 240 hours)  Urine Culture (for pregnant, neutropenic or urologic patients or patients with an indwelling urinary catheter)     Status: Abnormal   Collection Time: 08/16/24  5:07 AM   Specimen: Urine, Clean Catch  Result Value Ref Range Status   Specimen Description   Final    URINE, CLEAN CATCH Performed at [facility], [address] [place], [place] [address]    Special Requests   Final    NONE Performed at [facility], [address] [place], [place] [address]    Culture (A)  Final    30,000 COLONIES/mL PROTEUS MIRABILIS >=100,000 COLONIES/mL ESCHERICHIA COLI    Report Status 08/20/2024 FINAL  Final   Organism ID, Bacteria PROTEUS MIRABILIS (A)  Final   Organism ID, Bacteria ESCHERICHIA COLI (A)  Final      Susceptibility   Escherichia coli - MIC*    AMPICILLIN >=32 RESISTANT Resistant     CEFAZOLIN  (URINE) Value in next row Sensitive      2 SENSITIVEThis is a modified FDA-approved test that has been validated and its  performance characteristics determined by the reporting laboratory.  This laboratory is certified under the Clinical Laboratory Improvement Amendments CLIA as qualified to perform high complexity clinical laboratory testing.     CEFEPIME Value in next row Sensitive      2 SENSITIVEThis is a modified FDA-approved test that has been validated and its performance characteristics determined by the reporting laboratory.  This laboratory is certified under the Clinical Laboratory Improvement Amendments CLIA as qualified to perform high complexity clinical laboratory testing.    ERTAPENEM Value in next row Sensitive      2 SENSITIVEThis is a modified FDA-approved test that has been validated and its performance characteristics determined by the reporting laboratory.  This laboratory is certified under the Clinical Laboratory Improvement Amendments CLIA as qualified to perform high complexity clinical laboratory testing.    CEFTRIAXONE  Value in next row Sensitive      2 SENSITIVEThis is a modified FDA-approved test that has been validated and its performance characteristics determined by the reporting laboratory.  This laboratory is certified under the Clinical Laboratory Improvement Amendments CLIA as qualified to perform high complexity clinical laboratory testing.    CIPROFLOXACIN Value in next row Sensitive      2 SENSITIVEThis is a modified FDA-approved test that has been validated and its performance characteristics determined by the reporting laboratory.  This laboratory is certified under the Clinical Laboratory Improvement Amendments CLIA as qualified to perform high complexity clinical laboratory testing.    GENTAMICIN Value in next row Sensitive      2 SENSITIVEThis is a modified FDA-approved test that has been validated and its performance characteristics determined by the reporting laboratory.  This laboratory is certified under the Clinical Laboratory Improvement Amendments CLIA as qualified to perform high complexity clinical laboratory testing.    NITROFURANTOIN Value in next row Sensitive      2 SENSITIVEThis is a modified FDA-approved test that has been validated and its performance characteristics determined by the  reporting laboratory.  This laboratory is certified under the Clinical Laboratory Improvement Amendments CLIA as qualified to perform high complexity clinical laboratory testing.    TRIMETH/SULFA Value in next row Sensitive      2 SENSITIVEThis is a modified FDA-approved test that has been validated and its performance characteristics determined by the reporting laboratory.  This laboratory is certified under the Clinical Laboratory Improvement Amendments CLIA as qualified to perform high complexity clinical laboratory testing.    AMPICILLIN/SULBACTAM Value in next row Sensitive      2 SENSITIVEThis is a modified FDA-approved test that has been validated and its performance characteristics determined by the reporting laboratory.  This laboratory is certified under the Clinical Laboratory Improvement Amendments CLIA as qualified to perform high complexity clinical laboratory testing.    PIP/TAZO Value in next row Sensitive      <=4 SENSITIVEThis is a modified FDA-approved test that has been validated and its performance characteristics determined by the reporting laboratory.  This laboratory is certified under the Clinical Laboratory Improvement Amendments CLIA as qualified to perform high complexity clinical laboratory testing.    MEROPENEM Value in next row Sensitive      <=4 SENSITIVEThis is a modified FDA-approved test that has been validated and its performance characteristics determined by the reporting laboratory.  This laboratory is certified under the Clinical Laboratory Improvement Amendments CLIA as qualified to perform high complexity clinical laboratory testing.    * >=100,000 COLONIES/mL ESCHERICHIA COLI   Proteus mirabilis - MIC*    AMPICILLIN Value in  next row Sensitive      <=4 SENSITIVEThis is a modified FDA-approved test that has been validated and its performance characteristics determined by the reporting laboratory.  This laboratory is certified under the Clinical Laboratory  Improvement Amendments CLIA as qualified to perform high complexity clinical laboratory testing.    CEFAZOLIN  (URINE) Value in next row Sensitive      8 SENSITIVEThis is a modified FDA-approved test that has been validated and its performance characteristics determined by the reporting laboratory.  This laboratory is certified under the Clinical Laboratory Improvement Amendments CLIA as qualified to perform high complexity clinical laboratory testing.    CEFEPIME Value in next row Sensitive      8 SENSITIVEThis is a modified FDA-approved test that has been validated and its performance characteristics determined by the reporting laboratory.  This laboratory is certified under the Clinical Laboratory Improvement Amendments CLIA as qualified to perform high complexity clinical laboratory testing.    ERTAPENEM Value in next row Sensitive      8 SENSITIVEThis is a modified FDA-approved test that has been validated and its performance characteristics determined by the reporting laboratory.  This laboratory is certified under the Clinical Laboratory Improvement Amendments CLIA as qualified to perform high complexity clinical laboratory testing.    CEFTRIAXONE  Value in next row Sensitive      8 SENSITIVEThis is a modified FDA-approved test that has been validated and its performance characteristics determined by the reporting laboratory.  This laboratory is certified under the Clinical Laboratory Improvement Amendments CLIA as qualified to perform high complexity clinical laboratory testing.    CIPROFLOXACIN Value in next row Sensitive      8 SENSITIVEThis is a modified FDA-approved test that has been validated and its performance characteristics determined by the reporting laboratory.  This laboratory is certified under the Clinical Laboratory Improvement Amendments CLIA as qualified to perform high complexity clinical laboratory testing.    GENTAMICIN Value in next row Sensitive      8 SENSITIVEThis is a  modified FDA-approved test that has been validated and its performance characteristics determined by the reporting laboratory.  This laboratory is certified under the Clinical Laboratory Improvement Amendments CLIA as qualified to perform high complexity clinical laboratory testing.    NITROFURANTOIN Value in next row Resistant      8 SENSITIVEThis is a modified FDA-approved test that has been validated and its performance characteristics determined by the reporting laboratory.  This laboratory is certified under the Clinical Laboratory Improvement Amendments CLIA as qualified to perform high complexity clinical laboratory testing.    TRIMETH/SULFA Value in next row Sensitive      8 SENSITIVEThis is a modified FDA-approved test that has been validated and its performance characteristics determined by the reporting laboratory.  This laboratory is certified under the Clinical Laboratory Improvement Amendments CLIA as qualified to perform high complexity clinical laboratory testing.    AMPICILLIN/SULBACTAM Value in next row Sensitive      8 SENSITIVEThis is a modified FDA-approved test that has been validated and its performance characteristics determined by the reporting laboratory.  This laboratory is certified under the Clinical Laboratory Improvement Amendments CLIA as qualified to perform high complexity clinical laboratory testing.    PIP/TAZO Value in next row Sensitive      <=4 SENSITIVEThis is a modified FDA-approved test that has been validated and its performance characteristics determined by the reporting laboratory.  This laboratory is certified under the Clinical Laboratory Improvement Amendments CLIA as qualified to perform high complexity clinical  laboratory testing.    MEROPENEM Value in next row Sensitive      <=4 SENSITIVEThis is a modified FDA-approved test that has been validated and its performance characteristics determined by the reporting laboratory.  This laboratory is certified  under the Clinical Laboratory Improvement Amendments CLIA as qualified to perform high complexity clinical laboratory testing.    * 30,000 COLONIES/mL PROTEUS MIRABILIS      Radiology Studies last 3 days: DG Chest Port 1 View Result Date: 08/20/2024 CLINICAL DATA:  Cough and fever. EXAM: PORTABLE CHEST 1 VIEW COMPARISON:  04/09/2022 FINDINGS: Lungs are hypoinflated with minimal prominence of the central pulmonary vessels which may be due to mild vascular congestion. Possible patchy density over the right midlung which could be due to atelectasis or early infection. No effusion or pneumothorax. Cardiac silhouette is normal. Old left lateral rib fractures. IMPRESSION: 1. Hypoinflation with possible mild vascular congestion. 2. Possible patchy density over the right midlung which could be due to atelectasis or early infection. Electronically Signed   By: Toribio Agreste M.D.   On: 08/20/2024 11:45          Therasa Lorenzi, DO [facility] 08/20/2024, 4:32 PM    Dictation software may have been used to generate the above note. Typos may occur and escape review in typed/dictated notes. Please contact Dr Marsa directly for clarity if needed.  Staff may message me via secure chat in Epic  but this may not receive an immediate response,  please page me for urgent matters!  If 7PM-7AM, please contact night coverage [email or web address]

## 2024-08-20 NOTE — Progress Notes (Signed)
 Received pt from [facility]A. VS was taken, pt has temp of 102.3. pt has been on Yellow MEWS before transfer. Tylenol  PRN and scheduled oral antibiotic was given. MD was informed of the fever, MD ordered Covid testing. Done with swab, pending result.   08/20/24 1720  Assess: MEWS Score  Temp (!) 102.3 F (39.1 C)  BP 139/77  MAP (mmHg) 96  Pulse Rate 94  Resp 20  SpO2 94 %  O2 Device Room Air  Assess: MEWS Score  MEWS Temp 2  MEWS Systolic 0  MEWS Pulse 0  MEWS RR 0  MEWS LOC 1  MEWS Score 3  MEWS Score Color Yellow  Assess: if the MEWS score is Yellow or Red  Were vital signs accurate and taken at a resting state? Yes  Does the patient meet 2 or more of the SIRS criteria? Yes  Does the patient have a confirmed or suspected source of infection? Yes  MEWS guidelines implemented  No, previously yellow, continue vital signs every 4 hours  Notify: Charge Nurse/RN  Name of Charge Nurse/RN Notified Conservation Officer, Nature  Provider Notification  Provider Name/Title Alexander MD  Date Provider Notified 08/20/24  Time Provider Notified 1745  Method of Notification Page (secured chat)  Notification Reason Other (Comment) (YELLOW MEWS, Fever)  Provider response See new orders (Tylenol  and oral abx given. test for covid)  Date of Provider Response 08/20/24  Time of Provider Response 1750  Assess: SIRS CRITERIA  SIRS Temperature  1  SIRS Respirations  0  SIRS Pulse 1  SIRS WBC 0  SIRS Score Sum  2

## 2024-08-21 DIAGNOSIS — G9341 Metabolic encephalopathy: Secondary | ICD-10-CM | POA: Diagnosis not present

## 2024-08-21 LAB — RESPIRATORY PANEL BY PCR

## 2024-08-21 LAB — CBC WITH DIFFERENTIAL/PLATELET
Abs Immature Granulocytes: 0.01 K/uL (ref 0.00–0.07)
Basophils Absolute: 0.1 K/uL (ref 0.0–0.1)
Basophils Relative: 1 %
Eosinophils Absolute: 0.2 K/uL (ref 0.0–0.5)
Eosinophils Relative: 2 %
HCT: 44.5 % (ref 36.0–46.0)
Hemoglobin: 14.7 g/dL (ref 12.0–15.0)
Immature Granulocytes: 0 %
Lymphocytes Relative: 22 %
Lymphs Abs: 1.5 K/uL (ref 0.7–4.0)
MCH: 30.2 pg (ref 26.0–34.0)
MCHC: 33 g/dL (ref 30.0–36.0)
MCV: 91.6 fL (ref 80.0–100.0)
Monocytes Absolute: 1.1 K/uL — ABNORMAL HIGH (ref 0.1–1.0)
Monocytes Relative: 17 %
Neutro Abs: 4 K/uL (ref 1.7–7.7)
Neutrophils Relative %: 58 %
Platelets: 212 K/uL (ref 150–400)
RBC: 4.86 MIL/uL (ref 3.87–5.11)
RDW: 13.6 % (ref 11.5–15.5)
WBC: 6.9 K/uL (ref 4.0–10.5)
nRBC: 0 % (ref 0.0–0.2)

## 2024-08-21 LAB — BASIC METABOLIC PANEL WITH GFR
Anion gap: 12 (ref 5–15)
BUN: 23 mg/dL (ref 8–23)
CO2: 25 mmol/L (ref 22–32)
Calcium: 8.9 mg/dL (ref 8.9–10.3)
Chloride: 104 mmol/L (ref 98–111)
Creatinine, Ser: 1.04 mg/dL — ABNORMAL HIGH (ref 0.44–1.00)
GFR, Estimated: 59 mL/min — ABNORMAL LOW (ref >60)
Glucose, Bld: 113 mg/dL — ABNORMAL HIGH (ref 70–99)
Potassium: 4.8 mmol/L (ref 3.5–5.1)
Sodium: 141 mmol/L (ref 135–145)

## 2024-08-21 LAB — MRSA NEXT GEN BY PCR, NASAL: MRSA by PCR Next Gen: DETECTED — AB

## 2024-08-21 MED ORDER — VANCOMYCIN HCL 1500 MG/300ML IV SOLN
1500.0000 mg | INTRAVENOUS | Status: DC
  Administered 2024-08-22 – 2024-08-23 (×2): 1500 mg via INTRAVENOUS
  Filled 2024-08-21 (×2): qty 300

## 2024-08-21 MED ORDER — VANCOMYCIN HCL 2000 MG/400ML IV SOLN
2000.0000 mg | Freq: Once | INTRAVENOUS | Status: AC
  Administered 2024-08-21: 2000 mg via INTRAVENOUS
  Filled 2024-08-21: qty 400

## 2024-08-21 MED ORDER — IBUPROFEN 400 MG PO TABS
400.0000 mg | ORAL_TABLET | Freq: Four times a day (QID) | ORAL | Status: DC | PRN
  Administered 2024-08-21: 400 mg via ORAL
  Filled 2024-08-21: qty 1

## 2024-08-21 NOTE — Plan of Care (Signed)

## 2024-08-21 NOTE — Progress Notes (Signed)
 Orders given.

## 2024-08-21 NOTE — Progress Notes (Signed)
 PROGRESS NOTE    [name]   FMW:[id number] DOB: 01-22-1956  DOA: 08/15/2024 Date of Service: 08/21/24 which is hospital day 6  PCP: Ricard Tawni KIDD, MD    Hospital course / significant events:   Haley Deleon is a 68 y.o. female with medical history significant of alcohol use disorder, dCHF, CAD, depression with anxiety, morbid obesity, ulcerative colitis, NSVT, PSVT, varicose vein in both legs, carotid artery stenosis, morbid obesity, who presents with left leg swelling and pain, confusion.   HPI:  initially presented to ED with left leg swelling and pain, and then she developed confusion in ED.  When admitting hospitalist saw patient in ED, she was alert, orientated to place and person, not to the time. She does not have pain/swelling in the right leg.  She denies fall or injury.  No fever or chills.  Per her granddaughter, patient used to drink liquor almost every day. Recently she reportedly drinks less, and last drinking was several days ago.   11/25: admitted to hospitalist w/ acute metabolic encephalopathy EtOH withdrawal, UTI.  11/26: remains confused / hallucinating this morning. Continuing withdrawal protocols  11/27: remains confused but is more calm today. Urine culture still pending.  11/28: remains confused but is more calm today and bit more conversant but not making sense. Urine culture still pending - staff has confirmed w/ lab that it is in process today as of 05:00 11/29: more alert but remains confused. PT/OT ordered for tomorrow. UCX pend susceptibilities  11/30: Proteus and Ecoli both sensitive to rocephin . Febrile this AM so will leave on abx, CXR read as possible R mid-lung atelectasis / early infection, also vascular congestion. Gave dose Lasix and will add azithro for possible PNA 12/01: continues to be febrile, more oriented but still confused / not conversational so difficult history. No skin lesions. Added vancomycin given (+)MRSA screen and skin  erythema groin, but likely d/t parainfluenzavirus   Consultants:  none  Procedures/Surgeries: none      ASSESSMENT & PLAN:   Acute metabolic encephalopathy d/t EtOH withdrawal, underlying UTI is probably a complicating factor but not primary cause of encephalopathy  Frequent neurochecks, CIWA Fall precaution Tx UTI as below   UTI: (+)Proteus and Ecoli  IV Rocephin  5-7d total therapy  Fever (+)parainfluenzavirus (+)MRSA screen  Groin candida but erythema may also be cellulitis  Abnormal CXR clinically significant, not definitive but possible early PNA Proteus UTI Ceftriaxone  + axithro covers pneumonia, UTI Vanc for MRSA / cellulitis    Pain and swelling of left lower leg  Chronic venous stasis Ruled out DVT Supportive care   Chronic diastolic CHF (congestive heart failure)  2D echo on 02/24/2021 showed EF 60 to 65%.  Patient has leg edema, but no SOB.  Does not seem to have CHF exacerbation. Equivocal  BNP I&O Diuresis as needed - held today w/ slight bump in Cr   Ulcerative colitis  No active nausea vomiting or diarrhea.   Continue as needed Imodium    Intertriginous candidiasis:  In groin area. Nystatin  powder topcially   Depression Continue home Lexapro  if able to take po but ok to hold if needed     Class 3 obesity based on BMI: Body mass index is 41.5 kg/m.SABRA Significantly low or high BMI is associated with higher medical risk.  Underweight - under 18  overweight - 25 to 29 obese - 30 or more Class 1 obesity: BMI of 30.0 to 34 Class 2 obesity: BMI of 35.0 to 39  Class 3 obesity: BMI of 40.0 to 49 Super Morbid Obesity: BMI 50-59 Super-super Morbid Obesity: BMI 60+ Healthy nutrition and physical activity advised as adjunct to other disease management and risk reduction treatments    DVT prophylaxis: lovenox  IV fluids: no continuous IV fluids  Nutrition: dysphagia diet if coherent to eat Central lines / other devices: none  Code Status: FULL  CODE ACP documentation reviewed:  none on file in [facility]  [facility] needs: SNF Medical barriers to dispo: febrile. Expected medical readiness for discharge few days             Subjective / Brief ROS:  Patient alert but still some confused. Can answer person / location orientation questions today but not in any detail and cannot answer time / situation   Family Communication: none at bedside today    Objective Findings:  Vitals:   08/21/24 0748 08/21/24 1000 08/21/24 1413 08/21/24 1518  BP: 137/70 132/66 (!) 143/75 (!) 145/78  Pulse: 92   93  Resp: 20  20 14   Temp: 99 F (37.2 C)  99.6 F (37.6 C) 100.2 F (37.9 C)  TempSrc:      SpO2: 93%  94% 92%  Weight:      Height:        Intake/Output Summary (Last 24 hours) at 08/21/2024 1709 Last data filed at 08/20/2024 2100 Gross per 24 hour  Intake --  Output 350 ml  Net -350 ml   Filed Weights   08/19/24 0506 08/20/24 0500 08/21/24 0500  Weight: 117.7 kg 119.9 kg 119.9 kg    Examination:  Physical Exam Constitutional:      General: She is not in acute distress.    Appearance: She is ill-appearing. She is not toxic-appearing or diaphoretic.  Cardiovascular:     Rate and Rhythm: Normal rate and regular rhythm.  Pulmonary:     Effort: Pulmonary effort is normal.     Breath sounds: Normal breath sounds.  Abdominal:     General: Bowel sounds are normal.  Musculoskeletal:     Right lower leg: Edema present.     Left lower leg: Edema present.  Skin:    General: Skin is warm and dry.  Neurological:     Mental Status: She is disoriented.  Psychiatric:        Behavior: Behavior normal.          Scheduled Medications:   azithromycin  500 mg Oral Daily   enoxaparin  (LOVENOX ) injection  60 mg Subcutaneous Q24H   escitalopram   10 mg Oral Daily   feeding supplement  237 mL Oral BID BM   folic acid   1 mg Oral Daily   multivitamin with minerals  1 tablet Oral Daily   nystatin    Topical BID   thiamine   100  mg Oral Daily   Or   thiamine   100 mg Intravenous Daily    Continuous Infusions:  cefTRIAXone  (ROCEPHIN )  IV 2 g (08/21/24 0120)   [START ON 08/22/2024] vancomycin      PRN Medications:  acetaminophen , hydrALAZINE , ibuprofen , loperamide , LORazepam , ondansetron  (ZOFRAN ) IV, oxyCODONE   Antimicrobials from admission:  Anti-infectives (From admission, onward)    Start     Dose/Rate Route Frequency Ordered Stop   08/22/24 0600  vancomycin (VANCOREADY) IVPB 1500 mg/300 mL        1,500 mg 150 mL/hr over 120 Minutes Intravenous Every 24 hours 08/21/24 1312     08/21/24 1130  vancomycin (VANCOREADY) IVPB 2000 mg/400 mL  2,000 mg 200 mL/hr over 120 Minutes Intravenous  Once 08/21/24 1040 08/21/24 1426   08/20/24 1730  azithromycin (ZITHROMAX) tablet 500 mg        500 mg Oral Daily 08/20/24 1632 08/25/24 0959   08/16/24 0100  cefTRIAXone  (ROCEPHIN ) 2 g in sodium chloride  0.9 % 100 mL IVPB        2 g 200 mL/hr over 30 Minutes Intravenous Every 24 hours 08/16/24 0057             Data Reviewed:  I have personally reviewed the following...  CBC: Recent Labs  Lab 08/15/24 1711 08/16/24 0625 08/20/24 0832 08/21/24 0819  WBC 10.1 10.4 7.3 6.9  NEUTROABS  --   --  4.8 4.0  HGB 14.2 13.1 14.7 14.7  HCT 43.5 39.8 44.8 44.5  MCV 91.8 92.6 91.6 91.6  PLT 248 217 221 212   Basic Metabolic Panel: Recent Labs  Lab 08/15/24 1711 08/15/24 2238 08/16/24 0507 08/17/24 0942 08/20/24 0832 08/21/24 0819  NA 140  --  139 140 142 141  K 4.0  --  4.2 3.7 4.0 4.8  CL 105  --  106 107 105 104  CO2 22  --  21* 22 25 25   GLUCOSE 151*  --  130* 107* 127* 113*  BUN 15  --  17 12 16 23   CREATININE 0.97  --  0.82 0.72 0.90 1.04*  CALCIUM 9.3  --  8.8* 8.8* 9.0 8.9  MG  --  1.8  --   --   --   --   PHOS  --  2.8  --   --   --   --    GFR: Estimated Creatinine Clearance: 70.4 mL/min (A) (by C-G formula based on SCr of 1.04 mg/dL (H)). Liver Function Tests: Recent Labs  Lab  08/16/24 0507  AST 38  ALT 19  ALKPHOS 99  BILITOT 0.7  PROT 6.9  ALBUMIN 3.5   No results for input(s): LIPASE, AMYLASE in the last 168 hours. Recent Labs  Lab 08/15/24 2238 08/17/24 0942  AMMONIA 19 22   Coagulation Profile: No results for input(s): INR, PROTIME in the last 168 hours. Cardiac Enzymes: Recent Labs  Lab 08/15/24 2238  CKTOTAL 101   BNP (last 3 results) Recent Labs    08/15/24 2238  PROBNP 483.0*   HbA1C: No results for input(s): HGBA1C in the last 72 hours. CBG: No results for input(s): GLUCAP in the last 168 hours. Lipid Profile: No results for input(s): CHOL, HDL, LDLCALC, TRIG, CHOLHDL, LDLDIRECT in the last 72 hours. Thyroid Function Tests: No results for input(s): TSH, T4TOTAL, FREET4, T3FREE, THYROIDAB in the last 72 hours. Anemia Panel: No results for input(s): VITAMINB12, FOLATE, FERRITIN, TIBC, IRON, RETICCTPCT in the last 72 hours. Most Recent Urinalysis On File:     Component Value Date/Time   COLORURINE YELLOW (A) 08/15/2024 2318   APPEARANCEUR CLOUDY (A) 08/15/2024 2318   APPEARANCEUR Clear 02/23/2014 2251   LABSPEC 1.019 08/15/2024 2318   LABSPEC 1.016 02/23/2014 2251   PHURINE 5.0 08/15/2024 2318   GLUCOSEU NEGATIVE 08/15/2024 2318   GLUCOSEU Negative 02/23/2014 2251   HGBUR NEGATIVE 08/15/2024 2318   BILIRUBINUR NEGATIVE 08/15/2024 2318   BILIRUBINUR Negative 02/23/2014 2251   KETONESUR NEGATIVE 08/15/2024 2318   PROTEINUR NEGATIVE 08/15/2024 2318   NITRITE NEGATIVE 08/15/2024 2318   LEUKOCYTESUR SMALL (A) 08/15/2024 2318   LEUKOCYTESUR Trace 02/23/2014 2251   Sepsis Labs: @LABRCNTIP (procalcitonin:4,lacticidven:4) Microbiology: Recent Results (from the past 240  hours)  Urine Culture (for pregnant, neutropenic or urologic patients or patients with an indwelling urinary catheter)     Status: Abnormal   Collection Time: 08/16/24  5:07 AM   Specimen: Urine, Clean Catch  Result  Value Ref Range Status   Specimen Description   Final    URINE, CLEAN CATCH Performed at [facility], [address] [place], [place] [address]    Special Requests   Final    NONE Performed at [facility], [address] [place], [place] [address]    Culture (A)  Final    30,000 COLONIES/mL PROTEUS MIRABILIS >=100,000 COLONIES/mL ESCHERICHIA COLI    Report Status 08/20/2024 FINAL  Final   Organism ID, Bacteria PROTEUS MIRABILIS (A)  Final   Organism ID, Bacteria ESCHERICHIA COLI (A)  Final      Susceptibility   Escherichia coli - MIC*    AMPICILLIN >=32 RESISTANT Resistant     CEFAZOLIN  (URINE) Value in next row Sensitive      2 SENSITIVEThis is a modified FDA-approved test that has been validated and its performance characteristics determined by the reporting laboratory.  This laboratory is certified under the Clinical Laboratory Improvement Amendments CLIA as qualified to perform high complexity clinical laboratory testing.    CEFEPIME Value in next row Sensitive      2 SENSITIVEThis is a modified FDA-approved test that has been validated and its performance characteristics determined by the reporting laboratory.  This laboratory is certified under the Clinical Laboratory Improvement Amendments CLIA as qualified to perform high complexity clinical laboratory testing.    ERTAPENEM Value in next row Sensitive      2 SENSITIVEThis is a modified FDA-approved test that has been validated and its performance characteristics determined by the reporting laboratory.  This laboratory is certified under the Clinical Laboratory Improvement Amendments CLIA as qualified to perform high complexity clinical laboratory testing.    CEFTRIAXONE  Value in next row Sensitive      2 SENSITIVEThis is a modified FDA-approved test that has been validated and its performance characteristics determined by the reporting laboratory.  This laboratory is certified under the Clinical Laboratory  Improvement Amendments CLIA as qualified to perform high complexity clinical laboratory testing.    CIPROFLOXACIN Value in next row Sensitive      2 SENSITIVEThis is a modified FDA-approved test that has been validated and its performance characteristics determined by the reporting laboratory.  This laboratory is certified under the Clinical Laboratory Improvement Amendments CLIA as qualified to perform high complexity clinical laboratory testing.    GENTAMICIN Value in next row Sensitive      2 SENSITIVEThis is a modified FDA-approved test that has been validated and its performance characteristics determined by the reporting laboratory.  This laboratory is certified under the Clinical Laboratory Improvement Amendments CLIA as qualified to perform high complexity clinical laboratory testing.    NITROFURANTOIN Value in next row Sensitive      2 SENSITIVEThis is a modified FDA-approved test that has been validated and its performance characteristics determined by the reporting laboratory.  This laboratory is certified under the Clinical Laboratory Improvement Amendments CLIA as qualified to perform high complexity clinical laboratory testing.    TRIMETH/SULFA Value in next row Sensitive      2 SENSITIVEThis is a modified FDA-approved test that has been validated and its performance characteristics determined by the reporting laboratory.  This laboratory is certified under the Clinical Laboratory Improvement Amendments CLIA as qualified to perform high complexity clinical  laboratory testing.    AMPICILLIN/SULBACTAM Value in next row Sensitive      2 SENSITIVEThis is a modified FDA-approved test that has been validated and its performance characteristics determined by the reporting laboratory.  This laboratory is certified under the Clinical Laboratory Improvement Amendments CLIA as qualified to perform high complexity clinical laboratory testing.    PIP/TAZO Value in next row Sensitive      <=4  SENSITIVEThis is a modified FDA-approved test that has been validated and its performance characteristics determined by the reporting laboratory.  This laboratory is certified under the Clinical Laboratory Improvement Amendments CLIA as qualified to perform high complexity clinical laboratory testing.    MEROPENEM Value in next row Sensitive      <=4 SENSITIVEThis is a modified FDA-approved test that has been validated and its performance characteristics determined by the reporting laboratory.  This laboratory is certified under the Clinical Laboratory Improvement Amendments CLIA as qualified to perform high complexity clinical laboratory testing.    * >=100,000 COLONIES/mL ESCHERICHIA COLI   Proteus mirabilis - MIC*    AMPICILLIN Value in next row Sensitive      <=4 SENSITIVEThis is a modified FDA-approved test that has been validated and its performance characteristics determined by the reporting laboratory.  This laboratory is certified under the Clinical Laboratory Improvement Amendments CLIA as qualified to perform high complexity clinical laboratory testing.    CEFAZOLIN  (URINE) Value in next row Sensitive      8 SENSITIVEThis is a modified FDA-approved test that has been validated and its performance characteristics determined by the reporting laboratory.  This laboratory is certified under the Clinical Laboratory Improvement Amendments CLIA as qualified to perform high complexity clinical laboratory testing.    CEFEPIME Value in next row Sensitive      8 SENSITIVEThis is a modified FDA-approved test that has been validated and its performance characteristics determined by the reporting laboratory.  This laboratory is certified under the Clinical Laboratory Improvement Amendments CLIA as qualified to perform high complexity clinical laboratory testing.    ERTAPENEM Value in next row Sensitive      8 SENSITIVEThis is a modified FDA-approved test that has been validated and its performance  characteristics determined by the reporting laboratory.  This laboratory is certified under the Clinical Laboratory Improvement Amendments CLIA as qualified to perform high complexity clinical laboratory testing.    CEFTRIAXONE  Value in next row Sensitive      8 SENSITIVEThis is a modified FDA-approved test that has been validated and its performance characteristics determined by the reporting laboratory.  This laboratory is certified under the Clinical Laboratory Improvement Amendments CLIA as qualified to perform high complexity clinical laboratory testing.    CIPROFLOXACIN Value in next row Sensitive      8 SENSITIVEThis is a modified FDA-approved test that has been validated and its performance characteristics determined by the reporting laboratory.  This laboratory is certified under the Clinical Laboratory Improvement Amendments CLIA as qualified to perform high complexity clinical laboratory testing.    GENTAMICIN Value in next row Sensitive      8 SENSITIVEThis is a modified FDA-approved test that has been validated and its performance characteristics determined by the reporting laboratory.  This laboratory is certified under the Clinical Laboratory Improvement Amendments CLIA as qualified to perform high complexity clinical laboratory testing.    NITROFURANTOIN Value in next row Resistant      8 SENSITIVEThis is a modified FDA-approved test that has been validated and its performance characteristics determined by  the reporting laboratory.  This laboratory is certified under the Clinical Laboratory Improvement Amendments CLIA as qualified to perform high complexity clinical laboratory testing.    TRIMETH/SULFA Value in next row Sensitive      8 SENSITIVEThis is a modified FDA-approved test that has been validated and its performance characteristics determined by the reporting laboratory.  This laboratory is certified under the Clinical Laboratory Improvement Amendments CLIA as qualified to perform  high complexity clinical laboratory testing.    AMPICILLIN/SULBACTAM Value in next row Sensitive      8 SENSITIVEThis is a modified FDA-approved test that has been validated and its performance characteristics determined by the reporting laboratory.  This laboratory is certified under the Clinical Laboratory Improvement Amendments CLIA as qualified to perform high complexity clinical laboratory testing.    PIP/TAZO Value in next row Sensitive      <=4 SENSITIVEThis is a modified FDA-approved test that has been validated and its performance characteristics determined by the reporting laboratory.  This laboratory is certified under the Clinical Laboratory Improvement Amendments CLIA as qualified to perform high complexity clinical laboratory testing.    MEROPENEM Value in next row Sensitive      <=4 SENSITIVEThis is a modified FDA-approved test that has been validated and its performance characteristics determined by the reporting laboratory.  This laboratory is certified under the Clinical Laboratory Improvement Amendments CLIA as qualified to perform high complexity clinical laboratory testing.    * 30,000 COLONIES/mL PROTEUS MIRABILIS  Resp panel by RT-PCR (RSV, Flu A&B, Covid) Anterior Nasal Swab     Status: None   Collection Time: 08/20/24  5:34 PM   Specimen: Anterior Nasal Swab  Result Value Ref Range Status   SARS Coronavirus 2 by RT PCR NEGATIVE NEGATIVE Final    Comment: (NOTE) SARS-CoV-2 target nucleic acids are NOT DETECTED.  The SARS-CoV-2 RNA is generally detectable in upper respiratory specimens during the acute phase of infection. The lowest concentration of SARS-CoV-2 viral copies this assay can detect is 138 copies/mL. A negative result does not preclude SARS-Cov-2 infection and should not be used as the sole basis for treatment or other patient management decisions. A negative result may occur with  improper specimen collection/handling, submission of specimen other than  nasopharyngeal swab, presence of viral mutation(s) within the areas targeted by this assay, and inadequate number of viral copies(<138 copies/mL). A negative result must be combined with clinical observations, patient history, and epidemiological information. The expected result is Negative.  Fact Sheet for Patients:  bloggercourse.com  Fact Sheet for Healthcare Providers:  seriousbroker.it  This test is no t yet approved or cleared by the [place]  FDA and  has been authorized for detection and/or diagnosis of SARS-CoV-2 by FDA under an Emergency Use Authorization (EUA). This EUA will remain  in effect (meaning this test can be used) for the duration of the COVID-19 declaration under Section 564(b)(1) of the Act, 21 U.S.C.section 360bbb-3(b)(1), unless the authorization is terminated  or revoked sooner.       Influenza A by PCR NEGATIVE NEGATIVE Final   Influenza B by PCR NEGATIVE NEGATIVE Final    Comment: (NOTE) The Xpert Xpress SARS-CoV-2/FLU/RSV plus assay is intended as an aid in the diagnosis of influenza from Nasopharyngeal swab specimens and should not be used as a sole basis for treatment. Nasal washings and aspirates are unacceptable for Xpert Xpress SARS-CoV-2/FLU/RSV testing.  Fact Sheet for Patients: bloggercourse.com  Fact Sheet for Healthcare Providers: seriousbroker.it  This test is not yet approved  or cleared by the [place]  FDA and has been authorized for detection and/or diagnosis of SARS-CoV-2 by FDA under an Emergency Use Authorization (EUA). This EUA will remain in effect (meaning this test can be used) for the duration of the COVID-19 declaration under Section 564(b)(1) of the Act, 21 U.S.C. section 360bbb-3(b)(1), unless the authorization is terminated or revoked.     Resp Syncytial Virus by PCR NEGATIVE NEGATIVE Final    Comment:  (NOTE) Fact Sheet for Patients: bloggercourse.com  Fact Sheet for Healthcare Providers: seriousbroker.it  This test is not yet approved or cleared by the [place]  FDA and has been authorized for detection and/or diagnosis of SARS-CoV-2 by FDA under an Emergency Use Authorization (EUA). This EUA will remain in effect (meaning this test can be used) for the duration of the COVID-19 declaration under Section 564(b)(1) of the Act, 21 U.S.C. section 360bbb-3(b)(1), unless the authorization is terminated or revoked.  Performed at [facility], [address] [place], [place] [address]   Respiratory (~20 pathogens) panel by PCR     Status: Abnormal   Collection Time: 08/21/24  8:23 AM   Specimen: Nasopharyngeal Swab; Respiratory  Result Value Ref Range Status   Adenovirus NOT DETECTED NOT DETECTED Final   Coronavirus 229E NOT DETECTED NOT DETECTED Final    Comment: (NOTE) The Coronavirus on the Respiratory Panel, DOES NOT test for the novel  Coronavirus (2019 nCoV)    Coronavirus HKU1 NOT DETECTED NOT DETECTED Final   Coronavirus NL63 NOT DETECTED NOT DETECTED Final   Coronavirus OC43 NOT DETECTED NOT DETECTED Final   Metapneumovirus NOT DETECTED NOT DETECTED Final   Rhinovirus / Enterovirus NOT DETECTED NOT DETECTED Final   Influenza A NOT DETECTED NOT DETECTED Final   Influenza B NOT DETECTED NOT DETECTED Final   Parainfluenza Virus 1 DETECTED (A) NOT DETECTED Final   Parainfluenza Virus 2 NOT DETECTED NOT DETECTED Final   Parainfluenza Virus 3 NOT DETECTED NOT DETECTED Final   Parainfluenza Virus 4 NOT DETECTED NOT DETECTED Final   Respiratory Syncytial Virus NOT DETECTED NOT DETECTED Final   Bordetella pertussis NOT DETECTED NOT DETECTED Final   Bordetella Parapertussis NOT DETECTED NOT DETECTED Final   Chlamydophila pneumoniae NOT DETECTED NOT DETECTED Final   Mycoplasma pneumoniae NOT DETECTED NOT DETECTED  Final    Comment: Performed at [facility], [address], [place], [place] [address]  MRSA Next Gen by PCR, Nasal     Status: Abnormal   Collection Time: 08/21/24  8:23 AM   Specimen: Nasal Mucosa; Nasal Swab  Result Value Ref Range Status   MRSA by PCR Next Gen DETECTED (A) NOT DETECTED Final    Comment: RESULT CALLED TO, READ BACK BY AND VERIFIED WITH: ALLIE T. RN 1032 08/21/24 HNM (NOTE) The GeneXpert MRSA Assay (FDA approved for NASAL specimens only), is one component of a comprehensive MRSA colonization surveillance program. It is not intended to diagnose MRSA infection nor to guide or monitor treatment for MRSA infections. Test performance is not FDA approved in patients less than 54 years old. Performed at [facility], [address], [place], [place] [address]       Radiology Studies last 3 days: [facility] 1 View Result Date: 08/20/2024 CLINICAL DATA:  Cough and fever. EXAM: PORTABLE CHEST 1 VIEW COMPARISON:  04/09/2022 FINDINGS: Lungs are hypoinflated with minimal prominence of the central pulmonary vessels which may be due to mild vascular congestion. Possible patchy density over the right midlung  which could be due to atelectasis or early infection. No effusion or pneumothorax. Cardiac silhouette is normal. Old left lateral rib fractures. IMPRESSION: 1. Hypoinflation with possible mild vascular congestion. 2. Possible patchy density over the right midlung which could be due to atelectasis or early infection. Electronically Signed   By: Toribio Agreste M.D.   On: 08/20/2024 11:45          Din Bookwalter, DO [facility] 08/21/2024, 5:09 PM    Dictation software may have been used to generate the above note. Typos may occur and escape review in typed/dictated notes. Please contact Dr Marsa directly for clarity if needed.  Staff may message me via secure chat in Epic  but this may not receive an immediate response,  please page me  for urgent matters!  If 7PM-7AM, please contact night coverage [email or web address]

## 2024-08-21 NOTE — Progress Notes (Signed)
 Physical Therapy Treatment Patient Details Name: Haley Deleon MRN: [id number] DOB: Feb 13, 1956 Today's Date: 08/21/2024   History of Present Illness Pt is a 68 y.o. female who presents with left leg swelling and pain, confusion. MD assessment: Acute metabolic encephalopathy, UTI and possible alcohol withdrawal. PMH of alcohol use disorder, dCHF, CAD, depression with anxiety, morbid obesity, ulcerative colitis, NSVT, PSVT, varicose vein in both legs, carotid artery stenosis, morbid obesity    PT Comments  Patient seen for PT session focused on bed mobility. Patient required totalA x2 for rolling. Tolerated session poorly due to limited ability to response to commands. Patient shows poor potential to make progress with continued acute level rehab. Patient continues to demonstrate severe activity restrictions and poor tolerance for progressive mobility. Continued skilled PT recommended to progress toward functional goals and support discharge readiness. Pt making limited progress toward goals, will continue to follow POC. Discharge recommendation remains appropriate      If plan is discharge home, recommend the following: Two people to help with walking and/or transfers;Two people to help with bathing/dressing/bathroom;Help with stairs or ramp for entrance;Assist for transportation;Assistance with feeding;Assistance with cooking/housework;Direct supervision/assist for financial management;Supervision due to cognitive status   Can travel by private vehicle     No  Equipment Recommendations  Other (comment) (TBD)    Recommendations for Other Services       Precautions / Restrictions Precautions Precautions: Fall Recall of Precautions/Restrictions: Impaired Restrictions Weight Bearing Restrictions Per Provider Order: No     Mobility  Bed Mobility Overal bed mobility: Needs Assistance Bed Mobility: Rolling, Supine to Sit, Sit to Supine Rolling: Total assist, +2 for physical assistance          General bed mobility comments: requires constant/frequent multimodal cues for intiation, sequencing and technique, will minimally respond to questions and then not actively move/attempt movement upon commands; required total assist x2 with rolling pt not safe to sit EOB due to increased lathergy    Transfers                        Ambulation/Gait                   Stairs             Wheelchair Mobility     Tilt Bed    Modified Rankin (Stroke Patients Only)       Balance Overall balance assessment: Needs assistance   Sitting balance-Leahy Scale: Zero Sitting balance - Comments: pt total x2 to maintain seated balance Postural control: Posterior lean                                  Communication Communication Communication: Impaired Factors Affecting Communication: Reduced clarity of speech;Difficulty expressing self  Cognition Arousal: Obtunded Behavior During Therapy: Restless, Flat affect, Lability   PT - Cognitive impairments: Orientation, Awareness, Memory, Attention, Initiation, Sequencing, Difficult to assess Difficult to assess due to: Impaired communication, Level of arousal Orientation impairments: Place, Time, Situation                     Following commands: Impaired Following commands impaired: Follows one step commands inconsistently    Cueing Cueing Techniques: Verbal cues, Tactile cues, Visual cues, Gestural cues  Exercises      General Comments        Pertinent Vitals/Pain Pain Assessment Breathing: normal Negative Vocalization: repeated troubled  calling out, loud moaning/groaning, crying Facial Expression: smiling or inexpressive Body Language: relaxed Consolability: distracted or reassured by voice/touch PAINAD Score: 3 Pain Intervention(s): Limited activity within patient's tolerance, Monitored during session, Repositioned    Home Living                          Prior  Function            PT Goals (current goals can now be found in the care plan section) Acute Rehab PT Goals PT Goal Formulation: Patient unable to participate in goal setting    Frequency    Min 2X/week      PT Plan      Co-evaluation              AM-PAC PT 6 Clicks Mobility   Outcome Measure  Help needed turning from your back to your side while in a flat bed without using bedrails?: Total Help needed moving from lying on your back to sitting on the side of a flat bed without using bedrails?: Total Help needed moving to and from a bed to a chair (including a wheelchair)?: Total Help needed standing up from a chair using your arms (e.g., wheelchair or bedside chair)?: Total Help needed to walk in hospital room?: Total Help needed climbing 3-5 steps with a railing? : Total 6 Click Score: 6    End of Session   Activity Tolerance: Patient limited by lethargy Patient left: in bed;with call bell/phone within reach;with bed alarm set Nurse Communication: Mobility status PT Visit Diagnosis: Muscle weakness (generalized) (M62.81);History of falling (Z91.81);Difficulty in walking, not elsewhere classified (R26.2);Other symptoms and signs involving the nervous system (R29.898)     Time: 8845-8785 PT Time Calculation (min) (ACUTE ONLY): 20 min  Charges:    $Therapeutic Activity: 8-22 mins PT General Charges $$ ACUTE PT VISIT: 1 Visit                     Sherlean Lesches DPT, PT     Sherlean A Rosela Supak 08/21/2024, 12:32 PM

## 2024-08-21 NOTE — Consult Note (Signed)
 Pharmacy Antibiotic Note  Haley Deleon is a 68 y.o. female admitted on 08/15/2024 with left leg swelling/pain and confusion.  Patient has had persistent fevers while being admitted. Pharmacy has been consulted for Vancomycin dosing.  Plan:  Vancomycin 2g IV x 1 as loading dose, followed by: Vancomycin 1500 mg IV Q 24 hrs. Goal AUC 400-550. Expected AUC: 535.0 Expected Cmin: 13.0 SCr used: 1.04, Vd used: 0.5   Height: 5' 7 (170.2 cm) Weight: 119.9 kg (264 lb 5.3 oz) IBW/kg (Calculated) : 61.6  Temp (24hrs), Avg:100.4 F (38 C), Min:98.5 F (36.9 C), Max:102.3 F (39.1 C)  Recent Labs  Lab 08/15/24 1711 08/16/24 0507 08/16/24 0625 08/17/24 0942 08/20/24 0832 08/21/24 0819  WBC 10.1  --  10.4  --  7.3 6.9  CREATININE 0.97 0.82  --  0.72 0.90 1.04*    Estimated Creatinine Clearance: 70.4 mL/min (A) (by C-G formula based on SCr of 1.04 mg/dL (H)).    Allergies  Allergen Reactions   Caffeine Anaphylaxis and Other (See Comments)   Chocolate Anaphylaxis   Chocolate Flavoring Agent (Non-Screening) Anaphylaxis   Cocoa Anaphylaxis   Meloxicam Other (See Comments)    migraine and bleeding  Severe headache  meloxicam    Antimicrobials this admission: Ceftriaxone  11/26 >>  Azithromycin 11/30 >>  Vancomycin 12/1 >>  Dose adjustments this admission: N/A  Microbiology results: 11/26 UCx: 30K Proteus Mirabilis(R-nitrofurantoin), >100k E.coli(R-amp)  12/1 Resp cx: pending  12/1 MRSA PCR: POSITIVE  Thank you for allowing pharmacy to be a part of this patient's care.  Eesa Justiss A Ladajah Soltys 08/21/2024 10:56 AM

## 2024-08-21 NOTE — Progress Notes (Signed)
 Dr Lawence notified pt's temp 102. Not time for tylenol .

## 2024-08-21 NOTE — Plan of Care (Signed)
   Problem: Coping: Goal: Level of anxiety will decrease Outcome: Progressing

## 2024-08-21 NOTE — Progress Notes (Signed)
   08/21/24 1935  Assess: MEWS Score  Temp (!) 101.2 F (38.4 C)  BP 139/79  MAP (mmHg) 89  Pulse Rate 86  Resp 18  SpO2 98 %  O2 Device Room Air  Assess: MEWS Score  MEWS Temp 1  MEWS Systolic 0  MEWS Pulse 0  MEWS RR 0  MEWS LOC 1  MEWS Score 2  MEWS Score Color Yellow  Assess: if the MEWS score is Yellow or Red  Were vital signs accurate and taken at a resting state? Yes  Does the patient have a confirmed or suspected source of infection? Yes  MEWS guidelines implemented  Yes, yellow  Treat  MEWS Interventions Considered administering scheduled or prn medications/treatments as ordered  Take Vital Signs  Increase Vital Sign Frequency  Yellow: Q2hr x1, continue Q4hrs until patient remains green for 12hrs  Escalate  MEWS: Escalate Yellow: Discuss with charge nurse and consider notifying provider and/or RRT  Notify: Charge Nurse/RN  Name of Charge Nurse/RN Notified Dawn  Assess: SIRS CRITERIA  SIRS Temperature  1  SIRS Respirations  0  SIRS Pulse 0  SIRS WBC 0  SIRS Score Sum  1

## 2024-08-21 DEATH — deceased

## 2024-08-22 ENCOUNTER — Inpatient Hospital Stay

## 2024-08-22 DIAGNOSIS — G9341 Metabolic encephalopathy: Secondary | ICD-10-CM | POA: Diagnosis not present

## 2024-08-22 LAB — COMPREHENSIVE METABOLIC PANEL WITH GFR
ALT: 44 U/L (ref 0–44)
AST: 68 U/L — ABNORMAL HIGH (ref 15–41)
Albumin: 3.3 g/dL — ABNORMAL LOW (ref 3.5–5.0)
Alkaline Phosphatase: 86 U/L (ref 38–126)
Anion gap: 12 (ref 5–15)
BUN: 23 mg/dL (ref 8–23)
CO2: 25 mmol/L (ref 22–32)
Calcium: 8.6 mg/dL — ABNORMAL LOW (ref 8.9–10.3)
Chloride: 103 mmol/L (ref 98–111)
Creatinine, Ser: 0.88 mg/dL (ref 0.44–1.00)
GFR, Estimated: >60 mL/min (ref >60)
Glucose, Bld: 105 mg/dL — ABNORMAL HIGH (ref 70–99)
Potassium: 4.2 mmol/L (ref 3.5–5.1)
Sodium: 141 mmol/L (ref 135–145)
Total Bilirubin: 0.4 mg/dL (ref 0.0–1.2)
Total Protein: 6.9 g/dL (ref 6.5–8.1)

## 2024-08-22 LAB — CBC
HCT: 45.5 % (ref 36.0–46.0)
Hemoglobin: 14.5 g/dL (ref 12.0–15.0)
MCH: 29.4 pg (ref 26.0–34.0)
MCHC: 31.9 g/dL (ref 30.0–36.0)
MCV: 92.3 fL (ref 80.0–100.0)
Platelets: 194 K/uL (ref 150–400)
RBC: 4.93 MIL/uL (ref 3.87–5.11)
RDW: 13.5 % (ref 11.5–15.5)
WBC: 6.3 K/uL (ref 4.0–10.5)
nRBC: 0 % (ref 0.0–0.2)

## 2024-08-22 MED ORDER — ACETAMINOPHEN 650 MG RE SUPP
650.0000 mg | Freq: Four times a day (QID) | RECTAL | Status: DC | PRN
  Administered 2024-08-22 – 2024-08-28 (×3): 650 mg via RECTAL
  Filled 2024-08-22 (×3): qty 1

## 2024-08-22 NOTE — Plan of Care (Signed)
  Problem: Pain Managment: Goal: General experience of comfort will improve and/or be controlled Outcome: Progressing

## 2024-08-22 NOTE — Progress Notes (Signed)
 PROGRESS NOTE    Haley Deleon   FMW:[id number] DOB: 11/05/1955  DOA: 08/15/2024 Date of Service: 08/22/24 which is hospital day 7  PCP: Ricard Tawni KIDD, MD    Hospital course / significant events:   Haley Deleon is a 68 y.o. female with medical history significant of alcohol use disorder, dCHF, CAD, depression with anxiety, morbid obesity, ulcerative colitis, NSVT, PSVT, varicose vein in both legs, carotid artery stenosis, morbid obesity, who presents with left leg swelling and pain, confusion.   HPI:  initially presented to ED with left leg swelling and pain, and then she developed confusion in ED.  When admitting hospitalist saw patient in ED, she was alert, orientated to place and person, not to the time. She does not have pain/swelling in the right leg.  She denies fall or injury.  No fever or chills.  Per her granddaughter, patient used to drink liquor almost every day. Recently she reportedly drinks less, and last drinking was several days ago.   11/25: admitted to hospitalist w/ acute metabolic encephalopathy EtOH withdrawal, UTI.  11/26: remains confused / hallucinating this morning. Continuing withdrawal protocols  11/27: remains confused but is more calm today. Urine culture still pending.  11/28: remains confused but is more calm today and bit more conversant but not making sense. Urine culture still pending - staff has confirmed w/ lab that it is in process today as of 05:00 11/29: more alert but remains confused. PT/OT ordered for tomorrow. UCX pend susceptibilities  11/30: Proteus and Ecoli both sensitive to rocephin . Febrile this AM so will leave on abx, CXR read as possible R mid-lung atelectasis / early infection, also vascular congestion. Gave dose Lasix  and will add azithro for possible PNA 12/01: continues to be febrile, more oriented but still confused / not conversational so difficult history. No skin lesions. Added vancomycin  given (+)MRSA screen and skin  erythema groin, but likely d/t parainfluenza virus 12/02: febrile but improved, remains somewhat confused, continue abx    Consultants:  none  Procedures/Surgeries: none      ASSESSMENT & PLAN:   Acute metabolic encephalopathy d/t EtOH withdrawal, underlying UTI is probably a complicating factor but not primary cause of encephalopathy  Frequent neurochecks, CIWA Fall precaution Tx UTI as below   UTI: (+)Proteus and Ecoli  IV Rocephin  5-7d total therapy  Fever (+)parainfluenzavirus (+)MRSA screen  Groin candida but erythema may also be cellulitis  Abnormal CXR clinically significant, not definitive but possible early PNA Proteus UTI Ceftriaxone  + axithro covers pneumonia, UTI Vanc for MRSA / cellulitis    Pain and swelling of left lower leg  Chronic venous stasis Ruled out DVT Supportive care   Chronic diastolic CHF (congestive heart failure)  2D echo on 02/24/2021 showed EF 60 to 65%.  Patient has leg edema, but no SOB.  Does not seem to have CHF exacerbation. Equivocal  BNP I&O Diuresis as needed - held today w/ slight bump in Cr   Ulcerative colitis  No active nausea vomiting or diarrhea.   Continue as needed Imodium    Intertriginous candidiasis:  In groin area. Nystatin  powder topcially   Depression Continue home Lexapro  if able to take po but ok to hold if needed     Class 3 obesity based on BMI: Body mass index is 41.5 kg/m.SABRA Significantly low or high BMI is associated with higher medical risk.  Underweight - under 18  overweight - 25 to 29 obese - 30 or more Class 1 obesity: BMI of  30.0 to 34 Class 2 obesity: BMI of 35.0 to 39 Class 3 obesity: BMI of 40.0 to 49 Super Morbid Obesity: BMI 50-59 Super-super Morbid Obesity: BMI 60+ Healthy nutrition and physical activity advised as adjunct to other disease management and risk reduction treatments    DVT prophylaxis: lovenox  IV fluids: no continuous IV fluids  Nutrition: dysphagia diet if  coherent to eat Central lines / other devices: none  Code Status: FULL CODE ACP documentation reviewed:  none on file in [facility]  [facility] needs: SNF Medical barriers to dispo: febrile. Expected medical readiness for discharge few days             Subjective / Brief ROS:  Patient alert but still some confused. Can answer person / location orientation questions today but not in any detail and cannot answer time / situation and she is tangential,   Family Communication: call to granddaughter 08/22/24 4:16 PM      Objective Findings:  Vitals:   08/21/24 2119 08/22/24 0423 08/22/24 0742 08/22/24 0937  BP:  (!) 142/80 136/78   Pulse:  78 87   Resp:  18 20   Temp: 97.9 F (36.6 C) 99 F (37.2 C) (!) 101 F (38.3 C) 98.1 F (36.7 C)  TempSrc:  Oral Axillary Oral  SpO2:   95%   Weight:  119 kg    Height:        Intake/Output Summary (Last 24 hours) at 08/22/2024 1611 Last data filed at 08/22/2024 1033 Gross per 24 hour  Intake 840 ml  Output 1200 ml  Net -360 ml   Filed Weights   08/20/24 0500 08/21/24 0500 08/22/24 0423  Weight: 119.9 kg 119.9 kg 119 kg    Examination:  Physical Exam Constitutional:      General: She is not in acute distress.    Appearance: She is ill-appearing. She is not toxic-appearing or diaphoretic.  Cardiovascular:     Rate and Rhythm: Normal rate and regular rhythm.  Pulmonary:     Effort: Pulmonary effort is normal.     Breath sounds: Normal breath sounds.  Abdominal:     General: Bowel sounds are normal.  Musculoskeletal:     Right lower leg: Edema present.     Left lower leg: Edema present.  Skin:    General: Skin is warm and dry.  Neurological:     Mental Status: She is disoriented.  Psychiatric:        Behavior: Behavior normal.          Scheduled Medications:   azithromycin  500 mg Oral Daily   enoxaparin  (LOVENOX ) injection  60 mg Subcutaneous Q24H   escitalopram   10 mg Oral Daily   feeding supplement  237 mL  Oral BID BM   folic acid   1 mg Oral Daily   multivitamin with minerals  1 tablet Oral Daily   nystatin    Topical BID   thiamine   100 mg Oral Daily   Or   thiamine   100 mg Intravenous Daily    Continuous Infusions:  cefTRIAXone  (ROCEPHIN )  IV 2 g (08/22/24 0137)   vancomycin 1,500 mg (08/22/24 0459)    PRN Medications:  acetaminophen , acetaminophen , hydrALAZINE , ibuprofen , loperamide , LORazepam , ondansetron  (ZOFRAN ) IV, oxyCODONE   Antimicrobials from admission:  Anti-infectives (From admission, onward)    Start     Dose/Rate Route Frequency Ordered Stop   08/22/24 0600  vancomycin (VANCOREADY) IVPB 1500 mg/300 mL        1,500 mg 150 mL/hr over  120 Minutes Intravenous Every 24 hours 08/21/24 1312     08/21/24 1130  vancomycin  (VANCOREADY) IVPB 2000 mg/400 mL        2,000 mg 200 mL/hr over 120 Minutes Intravenous  Once 08/21/24 1040 08/21/24 1426   08/20/24 1730  azithromycin  (ZITHROMAX ) tablet 500 mg        500 mg Oral Daily 08/20/24 1632 08/25/24 0959   08/16/24 0100  cefTRIAXone  (ROCEPHIN ) 2 g in sodium chloride  0.9 % 100 mL IVPB        2 g 200 mL/hr over 30 Minutes Intravenous Every 24 hours 08/16/24 0057             Data Reviewed:  I have personally reviewed the following...  CBC: Recent Labs  Lab 08/15/24 1711 08/16/24 0625 08/20/24 0832 08/21/24 0819 08/22/24 0358  WBC 10.1 10.4 7.3 6.9 6.3  NEUTROABS  --   --  4.8 4.0  --   HGB 14.2 13.1 14.7 14.7 14.5  HCT 43.5 39.8 44.8 44.5 45.5  MCV 91.8 92.6 91.6 91.6 92.3  PLT 248 217 221 212 194   Basic Metabolic Panel: Recent Labs  Lab 08/15/24 2238 08/16/24 0507 08/17/24 0942 08/20/24 0832 08/21/24 0819 08/22/24 0358  NA  --  139 140 142 141 141  K  --  4.2 3.7 4.0 4.8 4.2  CL  --  106 107 105 104 103  CO2  --  21* 22 25 25 25   GLUCOSE  --  130* 107* 127* 113* 105*  BUN  --  17 12 16 23 23   CREATININE  --  0.82 0.72 0.90 1.04* 0.88  CALCIUM  --  8.8* 8.8* 9.0 8.9 8.6*  MG 1.8  --   --   --   --    --   PHOS 2.8  --   --   --   --   --    GFR: Estimated Creatinine Clearance: 82.9 mL/min (by C-G formula based on SCr of 0.88 mg/dL). Liver Function Tests: Recent Labs  Lab 08/16/24 0507 08/22/24 0358  AST 38 68*  ALT 19 44  ALKPHOS 99 86  BILITOT 0.7 0.4  PROT 6.9 6.9  ALBUMIN 3.5 3.3*   No results for input(s): LIPASE, AMYLASE in the last 168 hours. Recent Labs  Lab 08/15/24 2238 08/17/24 0942  AMMONIA 19 22   Coagulation Profile: No results for input(s): INR, PROTIME in the last 168 hours. Cardiac Enzymes: Recent Labs  Lab 08/15/24 2238  CKTOTAL 101   BNP (last 3 results) Recent Labs    08/15/24 2238  PROBNP 483.0*   HbA1C: No results for input(s): HGBA1C in the last 72 hours. CBG: No results for input(s): GLUCAP in the last 168 hours. Lipid Profile: No results for input(s): CHOL, HDL, LDLCALC, TRIG, CHOLHDL, LDLDIRECT in the last 72 hours. Thyroid Function Tests: No results for input(s): TSH, T4TOTAL, FREET4, T3FREE, THYROIDAB in the last 72 hours. Anemia Panel: No results for input(s): VITAMINB12, FOLATE, FERRITIN, TIBC, IRON, RETICCTPCT in the last 72 hours. Most Recent Urinalysis On File:     Component Value Date/Time   COLORURINE YELLOW (A) 08/15/2024 2318   APPEARANCEUR CLOUDY (A) 08/15/2024 2318   APPEARANCEUR Clear 02/23/2014 2251   LABSPEC 1.019 08/15/2024 2318   LABSPEC 1.016 02/23/2014 2251   PHURINE 5.0 08/15/2024 2318   GLUCOSEU NEGATIVE 08/15/2024 2318   GLUCOSEU Negative 02/23/2014 2251   HGBUR NEGATIVE 08/15/2024 2318   BILIRUBINUR NEGATIVE 08/15/2024 2318   BILIRUBINUR Negative 02/23/2014  2251   KETONESUR NEGATIVE 08/15/2024 2318   PROTEINUR NEGATIVE 08/15/2024 2318   NITRITE NEGATIVE 08/15/2024 2318   LEUKOCYTESUR SMALL (A) 08/15/2024 2318   LEUKOCYTESUR Trace 02/23/2014 2251   Sepsis Labs: @LABRCNTIP (procalcitonin:4,lacticidven:4) Microbiology: Recent Results (from the past 240  hours)  Urine Culture (for pregnant, neutropenic or urologic patients or patients with an indwelling urinary catheter)     Status: Abnormal   Collection Time: 08/16/24  5:07 AM   Specimen: Urine, Clean Catch  Result Value Ref Range Status   Specimen Description   Final    URINE, CLEAN CATCH Performed at [facility], [address], [place], [place] [address]    Special Requests   Final    NONE Performed at [facility], [address] [place], [place] [address]    Culture (A)  Final    30,000 COLONIES/mL PROTEUS MIRABILIS >=100,000 COLONIES/mL ESCHERICHIA COLI    Report Status 08/20/2024 FINAL  Final   Organism ID, Bacteria PROTEUS MIRABILIS (A)  Final   Organism ID, Bacteria ESCHERICHIA COLI (A)  Final      Susceptibility   Escherichia coli - MIC*    AMPICILLIN >=32 RESISTANT Resistant     CEFAZOLIN  (URINE) Value in next row Sensitive      2 SENSITIVEThis is a modified FDA-approved test that has been validated and its performance characteristics determined by the reporting laboratory.  This laboratory is certified under the Clinical Laboratory Improvement Amendments CLIA as qualified to perform high complexity clinical laboratory testing.    CEFEPIME Value in next row Sensitive      2 SENSITIVEThis is a modified FDA-approved test that has been validated and its performance characteristics determined by the reporting laboratory.  This laboratory is certified under the Clinical Laboratory Improvement Amendments CLIA as qualified to perform high complexity clinical laboratory testing.    ERTAPENEM Value in next row Sensitive      2 SENSITIVEThis is a modified FDA-approved test that has been validated and its performance characteristics determined by the reporting laboratory.  This laboratory is certified under the Clinical Laboratory Improvement Amendments CLIA as qualified to perform high complexity clinical laboratory testing.    CEFTRIAXONE  Value in next row  Sensitive      2 SENSITIVEThis is a modified FDA-approved test that has been validated and its performance characteristics determined by the reporting laboratory.  This laboratory is certified under the Clinical Laboratory Improvement Amendments CLIA as qualified to perform high complexity clinical laboratory testing.    CIPROFLOXACIN Value in next row Sensitive      2 SENSITIVEThis is a modified FDA-approved test that has been validated and its performance characteristics determined by the reporting laboratory.  This laboratory is certified under the Clinical Laboratory Improvement Amendments CLIA as qualified to perform high complexity clinical laboratory testing.    GENTAMICIN Value in next row Sensitive      2 SENSITIVEThis is a modified FDA-approved test that has been validated and its performance characteristics determined by the reporting laboratory.  This laboratory is certified under the Clinical Laboratory Improvement Amendments CLIA as qualified to perform high complexity clinical laboratory testing.    NITROFURANTOIN Value in next row Sensitive      2 SENSITIVEThis is a modified FDA-approved test that has been validated and its performance characteristics determined by the reporting laboratory.  This laboratory is certified under the Clinical Laboratory Improvement Amendments CLIA as qualified to perform high complexity clinical laboratory testing.    TRIMETH/SULFA Value in next row Sensitive  2 SENSITIVEThis is a modified FDA-approved test that has been validated and its performance characteristics determined by the reporting laboratory.  This laboratory is certified under the Clinical Laboratory Improvement Amendments CLIA as qualified to perform high complexity clinical laboratory testing.    AMPICILLIN/SULBACTAM Value in next row Sensitive      2 SENSITIVEThis is a modified FDA-approved test that has been validated and its performance characteristics determined by the reporting  laboratory.  This laboratory is certified under the Clinical Laboratory Improvement Amendments CLIA as qualified to perform high complexity clinical laboratory testing.    PIP/TAZO Value in next row Sensitive      <=4 SENSITIVEThis is a modified FDA-approved test that has been validated and its performance characteristics determined by the reporting laboratory.  This laboratory is certified under the Clinical Laboratory Improvement Amendments CLIA as qualified to perform high complexity clinical laboratory testing.    MEROPENEM Value in next row Sensitive      <=4 SENSITIVEThis is a modified FDA-approved test that has been validated and its performance characteristics determined by the reporting laboratory.  This laboratory is certified under the Clinical Laboratory Improvement Amendments CLIA as qualified to perform high complexity clinical laboratory testing.    * >=100,000 COLONIES/mL ESCHERICHIA COLI   Proteus mirabilis - MIC*    AMPICILLIN Value in next row Sensitive      <=4 SENSITIVEThis is a modified FDA-approved test that has been validated and its performance characteristics determined by the reporting laboratory.  This laboratory is certified under the Clinical Laboratory Improvement Amendments CLIA as qualified to perform high complexity clinical laboratory testing.    CEFAZOLIN  (URINE) Value in next row Sensitive      8 SENSITIVEThis is a modified FDA-approved test that has been validated and its performance characteristics determined by the reporting laboratory.  This laboratory is certified under the Clinical Laboratory Improvement Amendments CLIA as qualified to perform high complexity clinical laboratory testing.    CEFEPIME Value in next row Sensitive      8 SENSITIVEThis is a modified FDA-approved test that has been validated and its performance characteristics determined by the reporting laboratory.  This laboratory is certified under the Clinical Laboratory Improvement Amendments  CLIA as qualified to perform high complexity clinical laboratory testing.    ERTAPENEM Value in next row Sensitive      8 SENSITIVEThis is a modified FDA-approved test that has been validated and its performance characteristics determined by the reporting laboratory.  This laboratory is certified under the Clinical Laboratory Improvement Amendments CLIA as qualified to perform high complexity clinical laboratory testing.    CEFTRIAXONE  Value in next row Sensitive      8 SENSITIVEThis is a modified FDA-approved test that has been validated and its performance characteristics determined by the reporting laboratory.  This laboratory is certified under the Clinical Laboratory Improvement Amendments CLIA as qualified to perform high complexity clinical laboratory testing.    CIPROFLOXACIN Value in next row Sensitive      8 SENSITIVEThis is a modified FDA-approved test that has been validated and its performance characteristics determined by the reporting laboratory.  This laboratory is certified under the Clinical Laboratory Improvement Amendments CLIA as qualified to perform high complexity clinical laboratory testing.    GENTAMICIN Value in next row Sensitive      8 SENSITIVEThis is a modified FDA-approved test that has been validated and its performance characteristics determined by the reporting laboratory.  This laboratory is certified under the Clinical Laboratory Improvement Amendments CLIA as  qualified to perform high complexity clinical laboratory testing.    NITROFURANTOIN Value in next row Resistant      8 SENSITIVEThis is a modified FDA-approved test that has been validated and its performance characteristics determined by the reporting laboratory.  This laboratory is certified under the Clinical Laboratory Improvement Amendments CLIA as qualified to perform high complexity clinical laboratory testing.    TRIMETH/SULFA Value in next row Sensitive      8 SENSITIVEThis is a modified FDA-approved  test that has been validated and its performance characteristics determined by the reporting laboratory.  This laboratory is certified under the Clinical Laboratory Improvement Amendments CLIA as qualified to perform high complexity clinical laboratory testing.    AMPICILLIN/SULBACTAM Value in next row Sensitive      8 SENSITIVEThis is a modified FDA-approved test that has been validated and its performance characteristics determined by the reporting laboratory.  This laboratory is certified under the Clinical Laboratory Improvement Amendments CLIA as qualified to perform high complexity clinical laboratory testing.    PIP/TAZO Value in next row Sensitive      <=4 SENSITIVEThis is a modified FDA-approved test that has been validated and its performance characteristics determined by the reporting laboratory.  This laboratory is certified under the Clinical Laboratory Improvement Amendments CLIA as qualified to perform high complexity clinical laboratory testing.    MEROPENEM Value in next row Sensitive      <=4 SENSITIVEThis is a modified FDA-approved test that has been validated and its performance characteristics determined by the reporting laboratory.  This laboratory is certified under the Clinical Laboratory Improvement Amendments CLIA as qualified to perform high complexity clinical laboratory testing.    * 30,000 COLONIES/mL PROTEUS MIRABILIS  Resp panel by RT-PCR (RSV, Flu A&B, Covid) Anterior Nasal Swab     Status: None   Collection Time: 08/20/24  5:34 PM   Specimen: Anterior Nasal Swab  Result Value Ref Range Status   SARS Coronavirus 2 by RT PCR NEGATIVE NEGATIVE Final    Comment: (NOTE) SARS-CoV-2 target nucleic acids are NOT DETECTED.  The SARS-CoV-2 RNA is generally detectable in upper respiratory specimens during the acute phase of infection. The lowest concentration of SARS-CoV-2 viral copies this assay can detect is 138 copies/mL. A negative result does not preclude  SARS-Cov-2 infection and should not be used as the sole basis for treatment or other patient management decisions. A negative result may occur with  improper specimen collection/handling, submission of specimen other than nasopharyngeal swab, presence of viral mutation(s) within the areas targeted by this assay, and inadequate number of viral copies(<138 copies/mL). A negative result must be combined with clinical observations, patient history, and epidemiological information. The expected result is Negative.  Fact Sheet for Patients:  bloggercourse.com  Fact Sheet for Healthcare Providers:  seriousbroker.it  This test is no t yet approved or cleared by the [place]  FDA and  has been authorized for detection and/or diagnosis of SARS-CoV-2 by FDA under an Emergency Use Authorization (EUA). This EUA will remain  in effect (meaning this test can be used) for the duration of the COVID-19 declaration under Section 564(b)(1) of the Act, 21 U.S.C.section 360bbb-3(b)(1), unless the authorization is terminated  or revoked sooner.       Influenza A by PCR NEGATIVE NEGATIVE Final   Influenza B by PCR NEGATIVE NEGATIVE Final    Comment: (NOTE) The Xpert Xpress SARS-CoV-2/FLU/RSV plus assay is intended as an aid in the diagnosis of influenza from Nasopharyngeal swab specimens and should not  be used as a sole basis for treatment. Nasal washings and aspirates are unacceptable for Xpert Xpress SARS-CoV-2/FLU/RSV testing.  Fact Sheet for Patients: bloggercourse.com  Fact Sheet for Healthcare Providers: seriousbroker.it  This test is not yet approved or cleared by the [place]  FDA and has been authorized for detection and/or diagnosis of SARS-CoV-2 by FDA under an Emergency Use Authorization (EUA). This EUA will remain in effect (meaning this test can be used) for the duration of  the COVID-19 declaration under Section 564(b)(1) of the Act, 21 U.S.C. section 360bbb-3(b)(1), unless the authorization is terminated or revoked.     Resp Syncytial Virus by PCR NEGATIVE NEGATIVE Final    Comment: (NOTE) Fact Sheet for Patients: bloggercourse.com  Fact Sheet for Healthcare Providers: seriousbroker.it  This test is not yet approved or cleared by the [place]  FDA and has been authorized for detection and/or diagnosis of SARS-CoV-2 by FDA under an Emergency Use Authorization (EUA). This EUA will remain in effect (meaning this test can be used) for the duration of the COVID-19 declaration under Section 564(b)(1) of the Act, 21 U.S.C. section 360bbb-3(b)(1), unless the authorization is terminated or revoked.  Performed at [facility], [address] [place], [place] [address]   Respiratory (~20 pathogens) panel by PCR     Status: Abnormal   Collection Time: 08/21/24  8:23 AM   Specimen: Nasopharyngeal Swab; Respiratory  Result Value Ref Range Status   Adenovirus NOT DETECTED NOT DETECTED Final   Coronavirus 229E NOT DETECTED NOT DETECTED Final    Comment: (NOTE) The Coronavirus on the Respiratory Panel, DOES NOT test for the novel  Coronavirus (2019 nCoV)    Coronavirus HKU1 NOT DETECTED NOT DETECTED Final   Coronavirus NL63 NOT DETECTED NOT DETECTED Final   Coronavirus OC43 NOT DETECTED NOT DETECTED Final   Metapneumovirus NOT DETECTED NOT DETECTED Final   Rhinovirus / Enterovirus NOT DETECTED NOT DETECTED Final   Influenza A NOT DETECTED NOT DETECTED Final   Influenza B NOT DETECTED NOT DETECTED Final   Parainfluenza Virus 1 DETECTED (A) NOT DETECTED Final   Parainfluenza Virus 2 NOT DETECTED NOT DETECTED Final   Parainfluenza Virus 3 NOT DETECTED NOT DETECTED Final   Parainfluenza Virus 4 NOT DETECTED NOT DETECTED Final   Respiratory Syncytial Virus NOT DETECTED NOT DETECTED Final    Bordetella pertussis NOT DETECTED NOT DETECTED Final   Bordetella Parapertussis NOT DETECTED NOT DETECTED Final   Chlamydophila pneumoniae NOT DETECTED NOT DETECTED Final   Mycoplasma pneumoniae NOT DETECTED NOT DETECTED Final    Comment: Performed at [facility], [address] [place], [place] [address]  MRSA Next Gen by PCR, Nasal     Status: Abnormal   Collection Time: 08/21/24  8:23 AM   Specimen: Nasal Mucosa; Nasal Swab  Result Value Ref Range Status   MRSA by PCR Next Gen DETECTED (A) NOT DETECTED Final    Comment: RESULT CALLED TO, READ BACK BY AND VERIFIED WITH: ALLIE T. RN 1032 08/21/24 HNM (NOTE) The GeneXpert MRSA Assay (FDA approved for NASAL specimens only), is one component of a comprehensive MRSA colonization surveillance program. It is not intended to diagnose MRSA infection nor to guide or monitor treatment for MRSA infections. Test performance is not FDA approved in patients less than 46 years old. Performed at [facility], [address] [place], [place] [address]       Radiology Studies last 3 days: [facility] Chest [facility] 1 View Result Date: 08/22/2024 CLINICAL DATA:  Cough and fever. EXAM: PORTABLE CHEST 1 VIEW COMPARISON:  08/20/2024 FINDINGS: Improved inspiration. Normal-sized heart. Clear lungs with normal vascularity. Mild positional scoliosis. IMPRESSION: No acute abnormality. Electronically Signed   By: Elspeth Bathe M.D.   On: 08/22/2024 15:11   DG Chest Port 1 View Result Date: 08/20/2024 CLINICAL DATA:  Cough and fever. EXAM: PORTABLE CHEST 1 VIEW COMPARISON:  04/09/2022 FINDINGS: Lungs are hypoinflated with minimal prominence of the central pulmonary vessels which may be due to mild vascular congestion. Possible patchy density over the right midlung which could be due to atelectasis or early infection. No effusion or pneumothorax. Cardiac silhouette is normal. Old left lateral rib fractures. IMPRESSION: 1. Hypoinflation with possible mild  vascular congestion. 2. Possible patchy density over the right midlung which could be due to atelectasis or early infection. Electronically Signed   By: Toribio Agreste M.D.   On: 08/20/2024 11:45          Nazim Kadlec, DO [facility] 08/22/2024, 4:11 PM    Dictation software may have been used to generate the above note. Typos may occur and escape review in typed/dictated notes. Please contact Dr Marsa directly for clarity if needed.  Staff may message me via secure chat in Epic  but this may not receive an immediate response,  please page me for urgent matters!  If 7PM-7AM, please contact night coverage [email or web address]

## 2024-08-23 DIAGNOSIS — G9341 Metabolic encephalopathy: Secondary | ICD-10-CM | POA: Diagnosis not present

## 2024-08-23 LAB — BASIC METABOLIC PANEL WITH GFR
Anion gap: 11 (ref 5–15)
BUN: 25 mg/dL — ABNORMAL HIGH (ref 8–23)
CO2: 24 mmol/L (ref 22–32)
Calcium: 8.6 mg/dL — ABNORMAL LOW (ref 8.9–10.3)
Chloride: 105 mmol/L (ref 98–111)
Creatinine, Ser: 0.83 mg/dL (ref 0.44–1.00)
GFR, Estimated: >60 mL/min (ref >60)
Glucose, Bld: 111 mg/dL — ABNORMAL HIGH (ref 70–99)
Potassium: 3.9 mmol/L (ref 3.5–5.1)
Sodium: 139 mmol/L (ref 135–145)

## 2024-08-23 LAB — CBC
HCT: 44.3 % (ref 36.0–46.0)
Hemoglobin: 14.3 g/dL (ref 12.0–15.0)
MCH: 29.4 pg (ref 26.0–34.0)
MCHC: 32.3 g/dL (ref 30.0–36.0)
MCV: 91.2 fL (ref 80.0–100.0)
Platelets: 194 K/uL (ref 150–400)
RBC: 4.86 MIL/uL (ref 3.87–5.11)
RDW: 13.4 % (ref 11.5–15.5)
WBC: 6.4 K/uL (ref 4.0–10.5)
nRBC: 0 % (ref 0.0–0.2)

## 2024-08-23 NOTE — Progress Notes (Signed)
 Progress Note   Patient: Haley Deleon FMW:[id number] DOB: 10-12-1955 DOA: 08/15/2024     8 DOS: the patient was seen and examined on 08/23/2024     Hospital course / significant events:    Haley Deleon is a 68 y.o. female with medical history significant of alcohol use disorder, dCHF, CAD, depression with anxiety, morbid obesity, ulcerative colitis, NSVT, PSVT, varicose vein in both legs, carotid artery stenosis, morbid obesity, who presents with left leg swelling and pain, confusion.    HPI:  initially presented to ED with left leg swelling and pain, and then she developed confusion in ED.  When admitting hospitalist saw patient in ED, she was alert, orientated to place and person, not to the time. She does not have pain/swelling in the right leg.  She denies fall or injury.  No fever or chills.  Per her granddaughter, patient used to drink liquor almost every day. Recently she reportedly drinks less, and last drinking was several days ago.    11/25: admitted to hospitalist w/ acute metabolic encephalopathy EtOH withdrawal, UTI.  11/26: remains confused / hallucinating this morning. Continuing withdrawal protocols  11/27: remains confused but is more calm today. Urine culture still pending.  11/28: remains confused but is more calm today and bit more conversant but not making sense. Urine culture still pending - staff has confirmed w/ lab that it is in process today as of 05:00 11/29: more alert but remains confused. PT/OT ordered for tomorrow. UCX pend susceptibilities  11/30: Proteus and Ecoli both sensitive to rocephin . Febrile this AM so will leave on abx, CXR read as possible R mid-lung atelectasis / early infection, also vascular congestion. Gave dose Lasix  and will add azithro for possible PNA 12/01: continues to be febrile, more oriented but still confused / not conversational so difficult history. No skin lesions. Added vancomycin  given (+)MRSA screen and skin erythema groin, but  likely d/t parainfluenza virus 12/02: febrile but improved, remains somewhat confused, continue abx      Consultants:  none   Procedures/Surgeries: none           ASSESSMENT & PLAN:   Acute metabolic encephalopathy d/t EtOH withdrawal, underlying UTI is probably a complicating factor but not primary cause of encephalopathy  Frequent neurochecks, CIWA Fall precaution Tx UTI as below   UTI: (+)Proteus and Ecoli  IV Rocephin  5-7d total therapy   Fever (+)parainfluenzavirus (+)MRSA screen  Groin candida but erythema may also be cellulitis  Abnormal CXR clinically significant, not definitive but possible early PNA Proteus UTI Ceftriaxone  + axithro covers pneumonia, UTI Vanc for MRSA / cellulitis    Pain and swelling of left lower leg  Chronic venous stasis Ruled out DVT Supportive care   Chronic diastolic CHF (congestive heart failure)  2D echo on 02/24/2021 showed EF 60 to 65%.  Patient has leg edema, but no SOB.  Does not seem to have CHF exacerbation. Equivocal  BNP I&O Diuresis as needed - held today w/ slight bump in Cr   Ulcerative colitis  No active nausea vomiting or diarrhea.   Continue as needed Imodium    Intertriginous candidiasis:  In groin area. Nystatin  powder topcially   Depression Continue home Lexapro  if able to take po but ok to hold if needed      Class 3 obesity based on BMI: Body mass index is 41.5 kg/m. Healthy nutrition and physical activity advised as adjunct to other disease management and risk reduction treatments  DVT prophylaxis: lovenox   Code Status: FULL CODE ACP documentation reviewed:  none on file in [facility]   [facility] needs: SNF Medical barriers to dispo: febrile. Expected medical readiness for discharge few days    Subjective / Brief ROS:   Patient seen and examined at bedside this morning Denies nausea vomiting abdominal pain chest pain cough   Examination:  Physical Exam Constitutional:      General: She is not  in acute distress.    Appearance: She is ill-appearing. She is not toxic-appearing or diaphoretic.  Cardiovascular:     Rate and Rhythm: Normal rate and regular rhythm.  Pulmonary:     Effort: Pulmonary effort is normal.     Breath sounds: Normal breath sounds.  Abdominal:     General: Bowel sounds are normal.  Musculoskeletal:     Right lower leg: Edema present.     Left lower leg: Edema present.  Skin:    General: Skin is warm and dry.  Neurological:     Mental Status: She is disoriented.  Psychiatric:        Behavior: Behavior normal.      Vitals:   08/23/24 0511 08/23/24 0530 08/23/24 0956 08/23/24 1616  BP:  110/78 (!) 97/57 121/83  Pulse:  84 77 71  Resp:  20 20 20   Temp: 98.9 F (37.2 C) 99.8 F (37.7 C) 98.8 F (37.1 C) 98.3 F (36.8 C)  TempSrc: Axillary Axillary  Oral  SpO2:  96% 94% 96%  Weight:      Height:          Latest Ref Rng & Units 08/23/2024    5:05 AM 08/22/2024    3:58 AM 08/21/2024    8:19 AM  BMP  Glucose 70 - 99 mg/dL 888  894  886   BUN 8 - 23 mg/dL 25  23  23    Creatinine 0.44 - 1.00 mg/dL 9.16  9.11  8.95   Sodium 135 - 145 mmol/L 139  141  141   Potassium 3.5 - 5.1 mmol/L 3.9  4.2  4.8   Chloride 98 - 111 mmol/L 105  103  104   CO2 22 - 32 mmol/L 24  25  25    Calcium 8.9 - 10.3 mg/dL 8.6  8.6  8.9        Latest Ref Rng & Units 08/23/2024    5:05 AM 08/22/2024    3:58 AM 08/21/2024    8:19 AM  CBC  WBC 4.0 - 10.5 K/uL 6.4  6.3  6.9   Hemoglobin 12.0 - 15.0 g/dL 85.6  85.4  85.2   Hematocrit 36.0 - 46.0 % 44.3  45.5  44.5   Platelets 150 - 400 K/uL 194  194  212      Author: Drue ONEIDA Potter, MD 08/23/2024 5:33 PM  For on call review [email or web address].

## 2024-08-23 NOTE — Progress Notes (Signed)
 Occupational Therapy Treatment Patient Details Name: Haley Deleon MRN: [id number] DOB: Sep 15, 1956 Today's Date: 08/23/2024   History of present illness Pt is a 68 y.o. female who presents with left leg swelling and pain, confusion. MD assessment: Acute metabolic encephalopathy, UTI and possible alcohol withdrawal. PMH of alcohol use disorder, dCHF, CAD, depression with anxiety, morbid obesity, ulcerative colitis, NSVT, PSVT, varicose vein in both legs, carotid artery stenosis, morbid obesity   OT comments  Chart reviwed to date, pt sitting on edge of bed with PT at start of OT tx session, oriented to self and place. Improved cognition/alertness as compared to previous sessions however continues to appear altered from baseline. Pt also with improvements in static sitting balance on this date, progressing to periods of CGA sitting on edge of bed. Cervical spine appers laterally flexted to the L. Improved with facilitation techniques to midline. Pt is returned to semi supine, all needs met. Discharge recommendation remains appropriate.       If plan is discharge home, recommend the following:  Two people to help with walking and/or transfers;A lot of help with bathing/dressing/bathroom;Two people to help with bathing/dressing/bathroom   Equipment Recommendations  Other (comment) (defer to next venue of care)    Recommendations for Other Services      Precautions / Restrictions Precautions Precautions: Fall Recall of Precautions/Restrictions: Impaired Restrictions Weight Bearing Restrictions Per Provider Order: No       Mobility Bed Mobility Overal bed mobility: Needs Assistance Bed Mobility: Rolling, Sit to Supine Rolling: Total assist, +2 for physical assistance, Max assist     Sit to supine: Total assist, +2 for physical assistance   General bed mobility comments: pt sitting on edge of bed with PT and mobility tech as OT walked in    Transfers Overall transfer level: Needs  assistance   Transfers: Bed to chair/wheelchair/BSC            Lateral/Scoot Transfers: Total assist, +2 physical assistance, +2 safety/equipment General transfer comment: lateal scoot 1x up the bed to the R     Balance Overall balance assessment: Needs assistance Sitting-balance support: Feet supported Sitting balance-Leahy Scale: Poor                                     ADL either performed or assessed with clinical judgement   ADL Overall ADL's : Needs assistance/impaired Eating/Feeding: Minimal assistance;Bed level   Grooming: Moderate assistance;Sitting       Lower Body Bathing: Total assistance;Bed level Lower Body Bathing Details (indicate cue type and reason): incontinent BM     Lower Body Dressing: Total assistance Lower Body Dressing Details (indicate cue type and reason): doff socks     Toileting- Clothing Manipulation and Hygiene: Total assistance;Bed level              Extremity/Trunk Assessment              Vision       Perception     Praxis     Communication Communication Communication: Impaired Factors Affecting Communication: Reduced clarity of speech;Difficulty expressing self   Cognition Arousal: Lethargic Behavior During Therapy: Flat affect Cognition: Cognition impaired   Orientation impairments: Time, Situation Awareness: Intellectual awareness impaired, Online awareness impaired Memory impairment (select all impairments): Declarative long-term memory Attention impairment (select first level of impairment): Sustained attention Executive functioning impairment (select all impairments): Initiation, Organization, Sequencing, Reasoning, Problem solving  Following commands: Impaired Following commands impaired: Follows one step commands inconsistently      Cueing   Cueing Techniques: Verbal cues, Tactile cues, Visual cues, Gestural cues  Exercises Other Exercises Other Exercises: edu  re role of OT, role of rehab, discharge recommendations    Shoulder Instructions       General Comments vss, breakdown noted on sacral area, pt offloaded to the R and nurse placed sacral pad    Pertinent Vitals/ Pain       Pain Assessment Pain Assessment: Faces Faces Pain Scale: Hurts little more Pain Location: neck with placing patient in neutral Pain Descriptors / Indicators: Discomfort, Grimacing Pain Intervention(s): Monitored during session, Repositioned  Home Living                                          Prior Functioning/Environment              Frequency  Min 2X/week        Progress Toward Goals  OT Goals(current goals can now be found in the care plan section)  Progress towards OT goals: Progressing toward goals  Acute Rehab OT Goals Time For Goal Achievement: 09/03/24  Plan      Co-evaluation    PT/OT/SLP Co-Evaluation/Treatment: Yes Reason for Co-Treatment: Complexity of the patient's impairments (multi-system involvement);Necessary to address cognition/behavior during functional activity;For patient/therapist safety PT goals addressed during session: Mobility/safety with mobility;Strengthening/ROM OT goals addressed during session: ADL's and self-care;Proper use of Adaptive equipment and DME;Strengthening/ROM      AM-PAC OT 6 Clicks Daily Activity     Outcome Measure   Help from another person eating meals?: A Lot Help from another person taking care of personal grooming?: A Lot Help from another person toileting, which includes using toliet, bedpan, or urinal?: Total Help from another person bathing (including washing, rinsing, drying)?: Total Help from another person to put on and taking off regular upper body clothing?: A Lot Help from another person to put on and taking off regular lower body clothing?: Total 6 Click Score: 9    End of Session    OT Visit Diagnosis: Other abnormalities of gait and mobility  (R26.89);Muscle weakness (generalized) (M62.81)   Activity Tolerance Patient tolerated treatment well   Patient Left in bed;with call bell/phone within reach;with bed alarm set;with nursing/sitter in room   Nurse Communication Mobility status        Time: 9080-9060 OT Time Calculation (min): 20 min  Charges: OT General Charges $OT Visit: 1 Visit OT Treatments $Therapeutic Activity: 8-22 mins  Therisa Sheffield, OTD OTR/L  08/23/24, 11:00 AM

## 2024-08-23 NOTE — Plan of Care (Signed)

## 2024-08-23 NOTE — Progress Notes (Signed)
 Physical Therapy Treatment Patient Details Name: Haley Deleon MRN: [id number] DOB: 19-Jun-1956 Today's Date: 08/23/2024   History of Present Illness Pt is a 68 y.o. female who presents with left leg swelling and pain, confusion. MD assessment: Acute metabolic encephalopathy, UTI and possible alcohol withdrawal. PMH of alcohol use disorder, dCHF, CAD, depression with anxiety, morbid obesity, ulcerative colitis, NSVT, PSVT, varicose vein in both legs, carotid artery stenosis, morbid obesity    PT Comments  Patient seen for PT session focused on rolling, bed positioning and sitting EOB. Patient required totalA x 2   for transfers. Tolerated session fair  with no \\signs  of exertion with occasional reports of neck. Vitals remained stable during activity. Main limiting factors today were . Interventions aimed at improving bed mobility and sitting balance . Patient shows fair potential to make progress with continued acute level rehab. Patient continues to demonstrate severe activity restrictions and poor tolerance for progressive mobility. Continued skilled PT recommended to progress toward functional goals and support discharge readiness. Pt making good progress toward goals, will continue to follow POC. Discharge recommendation remains appropriate     If plan is discharge home, recommend the following: Two people to help with walking and/or transfers;Two people to help with bathing/dressing/bathroom;Help with stairs or ramp for entrance;Assist for transportation;Assistance with feeding;Assistance with cooking/housework;Direct supervision/assist for financial management;Supervision due to cognitive status   Can travel by private vehicle     No  Equipment Recommendations  Other (comment)    Recommendations for Other Services       Precautions / Restrictions Precautions Precautions: Fall Recall of Precautions/Restrictions: Impaired Restrictions Weight Bearing Restrictions Per Provider Order: No      Mobility  Bed Mobility Overal bed mobility: Needs Assistance Bed Mobility: Rolling, Sit to Supine Rolling: Total assist, +2 for physical assistance, Max assist   Supine to sit: Total assist, +2 for physical assistance Sit to supine: Total assist, +2 for physical assistance   General bed mobility comments: pt sitting on edge of bed with PT and mobility tech as OT walked in    Transfers Overall transfer level: Needs assistance   Transfers: Bed to chair/wheelchair/BSC            Lateral/Scoot Transfers: Total assist, +2 physical assistance, +2 safety/equipment General transfer comment: lateal scoot 1x up the bed to the R    Ambulation/Gait                   Stairs             Wheelchair Mobility     Tilt Bed    Modified Rankin (Stroke Patients Only)       Balance Overall balance assessment: Needs assistance Sitting-balance support: Feet supported Sitting balance-Leahy Scale: Poor Sitting balance - Comments: pt total x2 to maintain seated balance Postural control: Posterior lean                                  Communication Communication Communication: Impaired Factors Affecting Communication: Reduced clarity of speech;Difficulty expressing self  Cognition Arousal: Lethargic Behavior During Therapy: Flat affect   PT - Cognitive impairments: Orientation, Awareness, Memory, Attention, Initiation, Sequencing, Difficult to assess Difficult to assess due to: Impaired communication, Level of arousal Orientation impairments: Place, Time, Situation                     Following commands: Impaired Following commands impaired: Follows one step  commands inconsistently    Cueing Cueing Techniques: Verbal cues, Tactile cues, Visual cues, Gestural cues  Exercises      General Comments General comments (skin integrity, edema, etc.): sacral breakdown noted; pt offloaded RN notified      Pertinent Vitals/Pain Pain  Assessment Faces Pain Scale: Hurts little more Breathing: normal Negative Vocalization: none Facial Expression: smiling or inexpressive Body Language: relaxed Consolability: no need to console PAINAD Score: 0 Pain Location: neck with placing patient in neutral Pain Descriptors / Indicators: Discomfort, Grimacing Pain Intervention(s): Limited activity within patient's tolerance, Monitored during session    Home Living                          Prior Function            PT Goals (current goals can now be found in the care plan section) Acute Rehab PT Goals PT Goal Formulation: Patient unable to participate in goal setting    Frequency    Min 2X/week      PT Plan      Co-evaluation   Reason for Co-Treatment: Complexity of the patient's impairments (multi-system involvement);Necessary to address cognition/behavior during functional activity;For patient/therapist safety PT goals addressed during session: Mobility/safety with mobility;Strengthening/ROM OT goals addressed during session: ADL's and self-care;Proper use of Adaptive equipment and DME;Strengthening/ROM      AM-PAC PT 6 Clicks Mobility   Outcome Measure  Help needed turning from your back to your side while in a flat bed without using bedrails?: Total Help needed moving from lying on your back to sitting on the side of a flat bed without using bedrails?: Total Help needed moving to and from a bed to a chair (including a wheelchair)?: Total Help needed standing up from a chair using your arms (e.g., wheelchair or bedside chair)?: Total Help needed to walk in hospital room?: Total Help needed climbing 3-5 steps with a railing? : Total 6 Click Score: 6    End of Session   Activity Tolerance: Patient limited by lethargy Patient left: in bed;with call bell/phone within reach;with bed alarm set Nurse Communication: Mobility status PT Visit Diagnosis: Muscle weakness (generalized) (M62.81);History of  falling (Z91.81);Difficulty in walking, not elsewhere classified (R26.2);Other symptoms and signs involving the nervous system (R29.898)     Time: 9089-9062 PT Time Calculation (min) (ACUTE ONLY): 27 min  Charges:    $Therapeutic Activity: 8-22 mins PT General Charges $$ ACUTE PT VISIT: 1 Visit                     Sherlean Lesches DPT, PT     Sherlean A Evett Kassa 08/23/2024, 9:58 AM

## 2024-08-24 DIAGNOSIS — G9341 Metabolic encephalopathy: Secondary | ICD-10-CM | POA: Diagnosis not present

## 2024-08-24 LAB — BASIC METABOLIC PANEL WITH GFR
Anion gap: 9 (ref 5–15)
BUN: 23 mg/dL (ref 8–23)
CO2: 26 mmol/L (ref 22–32)
Calcium: 8.7 mg/dL — ABNORMAL LOW (ref 8.9–10.3)
Chloride: 100 mmol/L (ref 98–111)
Creatinine, Ser: 0.74 mg/dL (ref 0.44–1.00)
GFR, Estimated: >60 mL/min (ref >60)
Glucose, Bld: 94 mg/dL (ref 70–99)
Potassium: 4 mmol/L (ref 3.5–5.1)
Sodium: 135 mmol/L (ref 135–145)

## 2024-08-24 LAB — CBC WITH DIFFERENTIAL/PLATELET
Abs Immature Granulocytes: 0.02 K/uL (ref 0.00–0.07)
Basophils Absolute: 0 K/uL (ref 0.0–0.1)
Basophils Relative: 1 %
Eosinophils Absolute: 0.5 K/uL (ref 0.0–0.5)
Eosinophils Relative: 6 %
HCT: 42.5 % (ref 36.0–46.0)
Hemoglobin: 14.1 g/dL (ref 12.0–15.0)
Immature Granulocytes: 0 %
Lymphocytes Relative: 28 %
Lymphs Abs: 2 K/uL (ref 0.7–4.0)
MCH: 30 pg (ref 26.0–34.0)
MCHC: 33.2 g/dL (ref 30.0–36.0)
MCV: 90.4 fL (ref 80.0–100.0)
Monocytes Absolute: 0.6 K/uL (ref 0.1–1.0)
Monocytes Relative: 9 %
Neutro Abs: 4.1 K/uL (ref 1.7–7.7)
Neutrophils Relative %: 56 %
Platelets: 190 K/uL (ref 150–400)
RBC: 4.7 MIL/uL (ref 3.87–5.11)
RDW: 13.1 % (ref 11.5–15.5)
WBC: 7.2 K/uL (ref 4.0–10.5)
nRBC: 0 % (ref 0.0–0.2)

## 2024-08-24 NOTE — TOC Initial Note (Signed)
 Transition of Care [facility]) - Initial/Assessment Note    Patient Details  Name: Haley Deleon MRN: [id number] Date of Birth: December 28, 1955  Transition of [facility]) CM/SW Contact:    Corean ONEIDA Haddock, RN Phone Number: 08/24/2024, 4:23 PM  Clinical Narrative:                 Met with patient at bedside.  She states that she lives at home with her significant other [place]ford, and her 65 year old granddaughter.  She states that her 49 year old granddaughter [name] is also there at times  Therapy is recommending Sn[facility].  Patient declines and states she plans to return home at discharge.   [name] was at bedside and he was in agreement for patient to return home.   Per MD patient does not have capacity to made decisions.   Attempted to call granddaughter [name], unable to leave a VM, sent a text message requesting return call         Patient Goals and CMS Choice            Expected Discharge Plan and Services                                              Prior Living Arrangements/Services                       Activities of Daily Living      Permission Sought/Granted                  Emotional Assessment              Admission diagnosis:  Alcohol withdrawal (HCC) [F10.939] Leg swelling [M79.89] Yeast infection [B37.9] Alcohol withdrawal syndrome, with delirium (HCC) [F10.931] Altered mental status, unspecified altered mental status type [R41.82] Acute metabolic encephalopathy [G93.41] Patient Active Problem List   Diagnosis Date Noted   UTI (urinary tract infection) 08/16/2024   Acute metabolic encephalopathy 08/16/2024   Alcohol use disorder    Alcohol withdrawal (HCC) 08/15/2024   Chronic diastolic CHF (congestive heart failure) (HCC) 08/15/2024   Intertriginous candidiasis 08/15/2024   Depression 08/15/2024   Pain and swelling of left lower leg 08/15/2024   Obesity, Class III, BMI 40-49.9 (morbid obesity) (HCC) 08/15/2024   PSVT  (paroxysmal supraventricular tachycardia) 07/17/2021   Palpitations 02/24/2021   NSVT (nonsustained ventricular tachycardia) (HCC) 02/24/2021   Palpitation 02/24/2021   Varicose veins of lower extremity with pain, bilateral 07/09/2020   Varicose veins of leg with pain, left 06/14/2020   Dizziness 11/17/2017   Murmur, cardiac 11/17/2017   Screening for cardiovascular condition 11/17/2017   Lower extremity edema 11/17/2017   Gallstones and inflammation of gallbladder without obstruction    Incisional hernia, without obstruction or gangrene    History of biliary T-tube placement 02/24/2014   Cellulitis 02/24/2014   Cellulitis and abscess 02/24/2014   Ulcerative colitis (HCC) 08/22/2013   PCP:  Ricard Tawni KIDD, MD Pharmacy:   [facility] [address]), [place] - [address] [place]) [place] [address] Phone: [phone] Fax: [phone]     Social Drivers of Health (SDOH) Social History: SDOH Screenings   Food Insecurity: No Food Insecurity (08/21/2024)  Housing: Unknown (08/21/2024)  Transportation Needs: No Transportation Needs (08/21/2024)  Utilities: Not At Risk (08/21/2024)  Social Connections: Moderately  Isolated (08/22/2024)  Tobacco Use: Medium Risk (08/15/2024)   SDOH Interventions:     Readmission Risk Interventions     No data to display

## 2024-08-24 NOTE — Progress Notes (Signed)
 Progress Note   Patient: Haley Deleon FMW:[id number] DOB: December 20, 1955 DOA: 08/15/2024     9 DOS: the patient was seen and examined on 08/24/2024     Hospital course / significant events:    Haley Deleon is a 68 y.o. female with medical history significant of alcohol use disorder, dCHF, CAD, depression with anxiety, morbid obesity, ulcerative colitis, NSVT, PSVT, varicose vein in both legs, carotid artery stenosis, morbid obesity, who presents with left leg swelling and pain, confusion.    HPI:  initially presented to ED with left leg swelling and pain, and then she developed confusion in ED.  When admitting hospitalist saw patient in ED, she was alert, orientated to place and person, not to the time. She does not have pain/swelling in the right leg.  She denies fall or injury.  No fever or chills.  Per her granddaughter, patient used to drink liquor almost every day. Recently she reportedly drinks less, and last drinking was several days ago.    11/25: admitted to hospitalist w/ acute metabolic encephalopathy EtOH withdrawal, UTI.  11/26: remains confused / hallucinating this morning. Continuing withdrawal protocols  11/27: remains confused but is more calm today. Urine culture still pending.  11/28: remains confused but is more calm today and bit more conversant but not making sense. Urine culture still pending - staff has confirmed w/ lab that it is in process today as of 05:00 11/29: more alert but remains confused. PT/OT ordered for tomorrow. UCX pend susceptibilities  11/30: Proteus and Ecoli both sensitive to rocephin . Febrile this AM so will leave on abx, CXR read as possible R mid-lung atelectasis / early infection, also vascular congestion. Gave dose Lasix and will add azithro for possible PNA 12/01: continues to be febrile, more oriented but still confused / not conversational so difficult history. No skin lesions. Added vancomycin given (+)MRSA screen and skin erythema groin, but  likely d/t parainfluenza virus 12/02: febrile but improved, remains somewhat confused, continue abx      Consultants:  none   Procedures/Surgeries: none           ASSESSMENT & PLAN:   Acute metabolic encephalopathy d/t EtOH withdrawal, underlying UTI is probably a complicating factor but not primary cause of encephalopathy  Frequent neurochecks, CIWA Fall precaution Tx UTI as below Patient does not have capacity to make her own medical decision PT recommends skilled nursing facility however patient wanting to go home   UTI: (+)Proteus and Ecoli  Has completed 5 days course of antibiotic therapy   Fever (+)parainfluenzavirus (+)MRSA screen  Groin candida but erythema may also be cellulitis  Abnormal CXR clinically significant, not definitive but possible early PNA Proteus UTI Has completed antibiotic course   Pain and swelling of left lower leg  Chronic venous stasis Ruled out DVT Supportive care   Chronic diastolic CHF (congestive heart failure)  2D echo on 02/24/2021 showed EF 60 to 65%.  Patient has leg edema, but no SOB.  Does not seem to have CHF exacerbation. Equivocal  BNP I&O Diuresis as needed    Ulcerative colitis  No active nausea vomiting or diarrhea.   Continue as needed Imodium    Intertriginous candidiasis:  In groin area. Nystatin  powder topcially   Depression Continue home Lexapro  if able to take po but ok to hold if needed      Class 3 obesity based on BMI: Body mass index is 41.5 kg/m. Healthy nutrition and physical activity advised as adjunct to other disease management  and risk reduction treatments       DVT prophylaxis: lovenox    Code Status: FULL CODE ACP documentation reviewed:  none on file in [facility]   [facility] needs: SNF Medical barriers to dispo: febrile. Expected medical readiness for discharge few days     Subjective / Brief ROS:    Patient seen and examined at bedside this morning Denies nausea vomiting abdominal pain  chest pain cough   Examination:  Physical Exam Constitutional:      General: She is not in acute distress.    Appearance: She is ill-appearing. She is not toxic-appearing or diaphoretic.  Cardiovascular:     Rate and Rhythm: Normal rate and regular rhythm.  Pulmonary:     Effort: Pulmonary effort is normal.     Breath sounds: Normal breath sounds.  Abdominal:     General: Bowel sounds are normal.  Musculoskeletal:     Right lower leg: Edema present.     Left lower leg: Edema present.  Skin:    General: Skin is warm and dry.  Neurological: Patient having fluctuating mental status Psychiatric:        Behavior: Behavior normal.   Vitals:   08/23/24 2132 08/24/24 0445 08/24/24 0452 08/24/24 0811  BP: 118/68 108/73  127/77  Pulse: 74 70  66  Resp: 20 20  18   Temp: 97.7 F (36.5 C) 97.8 F (36.6 C)  99.1 F (37.3 C)  TempSrc:      SpO2: 94% 95%  98%  Weight:   118 kg   Height:          Latest Ref Rng & Units 08/24/2024    4:38 AM 08/23/2024    5:05 AM 08/22/2024    3:58 AM  CBC  WBC 4.0 - 10.5 K/uL 7.2  6.4  6.3   Hemoglobin 12.0 - 15.0 g/dL 85.8  85.6  85.4   Hematocrit 36.0 - 46.0 % 42.5  44.3  45.5   Platelets 150 - 400 K/uL 190  194  194        Latest Ref Rng & Units 08/24/2024    4:38 AM 08/23/2024    5:05 AM 08/22/2024    3:58 AM  BMP  Glucose 70 - 99 mg/dL 94  888  894   BUN 8 - 23 mg/dL 23  25  23    Creatinine 0.44 - 1.00 mg/dL 9.25  9.16  9.11   Sodium 135 - 145 mmol/L 135  139  141   Potassium 3.5 - 5.1 mmol/L 4.0  3.9  4.2   Chloride 98 - 111 mmol/L 100  105  103   CO2 22 - 32 mmol/L 26  24  25    Calcium 8.9 - 10.3 mg/dL 8.7  8.6  8.6      Author: Drue ONEIDA Potter, MD 08/24/2024 3:47 PM  For on call review [email or web address].

## 2024-08-25 DIAGNOSIS — G9341 Metabolic encephalopathy: Secondary | ICD-10-CM | POA: Diagnosis not present

## 2024-08-25 LAB — CBC WITH DIFFERENTIAL/PLATELET
Abs Immature Granulocytes: 0.03 K/uL (ref 0.00–0.07)
Basophils Absolute: 0 K/uL (ref 0.0–0.1)
Basophils Relative: 1 %
Eosinophils Absolute: 0.4 K/uL (ref 0.0–0.5)
Eosinophils Relative: 5 %
HCT: 43 % (ref 36.0–46.0)
Hemoglobin: 14.3 g/dL (ref 12.0–15.0)
Immature Granulocytes: 0 %
Lymphocytes Relative: 25 %
Lymphs Abs: 1.9 K/uL (ref 0.7–4.0)
MCH: 30 pg (ref 26.0–34.0)
MCHC: 33.3 g/dL (ref 30.0–36.0)
MCV: 90.3 fL (ref 80.0–100.0)
Monocytes Absolute: 0.7 K/uL (ref 0.1–1.0)
Monocytes Relative: 10 %
Neutro Abs: 4.3 K/uL (ref 1.7–7.7)
Neutrophils Relative %: 59 %
Platelets: 210 K/uL (ref 150–400)
RBC: 4.76 MIL/uL (ref 3.87–5.11)
RDW: 12.8 % (ref 11.5–15.5)
WBC: 7.3 K/uL (ref 4.0–10.5)
nRBC: 0 % (ref 0.0–0.2)

## 2024-08-25 LAB — BASIC METABOLIC PANEL WITH GFR
Anion gap: 11 (ref 5–15)
BUN: 20 mg/dL (ref 8–23)
CO2: 23 mmol/L (ref 22–32)
Calcium: 8.7 mg/dL — ABNORMAL LOW (ref 8.9–10.3)
Chloride: 105 mmol/L (ref 98–111)
Creatinine, Ser: 0.6 mg/dL (ref 0.44–1.00)
GFR, Estimated: >60 mL/min (ref >60)
Glucose, Bld: 103 mg/dL — ABNORMAL HIGH (ref 70–99)
Potassium: 3.8 mmol/L (ref 3.5–5.1)
Sodium: 138 mmol/L (ref 135–145)

## 2024-08-25 MED ORDER — GERHARDT'S BUTT CREAM
TOPICAL_CREAM | CUTANEOUS | Status: AC | PRN
  Administered 2024-08-25: 1 via TOPICAL
  Filled 2024-08-25: qty 60

## 2024-08-25 NOTE — Progress Notes (Addendum)
 Occupational Therapy Treatment Patient Details Name: OPLE GIRGIS MRN: [id number] DOB: 1956-08-09 Today's Date: 08/25/2024   History of present illness Pt is a 68 y.o. female who presents with left leg swelling and pain, confusion. MD assessment: Acute metabolic encephalopathy, UTI and possible alcohol withdrawal. PMH of alcohol use disorder, dCHF, CAD, depression with anxiety, morbid obesity, ulcerative colitis, NSVT, PSVT, varicose vein in both legs, carotid artery stenosis, morbid obesity   OT comments  Chart reviewed to date, pt greeted semi supine in bed, oriented to self and place. Poor awareness of situation/current level of functioning. Pt is agreeable to OT tx session targeting improving functional activity tolerance in prep for ADL tasks. Pt is making progress towards goals as evidenced by sitting on edge of bed for approx 5 minutes with CGA-MIN A. MAX A requried for LB dressing. Lateral neck flexion to the L appears to be improving but pt continues to present with mild flexion at rest. Improved with positioning and facilitation to midline. Pt reports she wants to go home, but she requires significant assist for all ADL/functional mobility att his time. Educated pt regarding dicsharge recommendations. Pt is left  offloaded to her L with pillow under R side, all needs met. OT will continue to follow.       If plan is discharge home, recommend the following:  Two people to help with walking and/or transfers;A lot of help with bathing/dressing/bathroom;Two people to help with bathing/dressing/bathroom   Equipment Recommendations  Other (comment) (defer to next venue of care)    Recommendations for Other Services      Precautions / Restrictions Precautions Precautions: Fall Recall of Precautions/Restrictions: Impaired Restrictions Weight Bearing Restrictions Per Provider Order: No       Mobility Bed Mobility Overal bed mobility: Needs Assistance Bed Mobility: Rolling, Supine  to Sit, Sit to Supine Rolling: +2 for physical assistance, Max assist   Supine to sit: Max assist, HOB elevated, Used rails Sit to supine: Max assist, Used rails        Transfers Overall transfer level: Needs assistance   Transfers: Bed to chair/wheelchair/BSC             General transfer comment: TOTAL A +1 for lateral scoot up the bed to the R     Balance Overall balance assessment: Needs assistance Sitting-balance support: Feet supported Sitting balance-Leahy Scale: Fair Sitting balance - Comments: approx 5 minutes sitting on edge of bed for CGA-MIN A with fatigue                                   ADL either performed or assessed with clinical judgement   ADL Overall ADL's : Needs assistance/impaired                     Lower Body Dressing: Maximal assistance;Bed level                      Extremity/Trunk Assessment Upper Extremity Assessment Upper Extremity Assessment: Generalized weakness   Lower Extremity Assessment Lower Extremity Assessment: Generalized weakness   Cervical / Trunk Assessment Cervical / Trunk Assessment: Kyphotic    Vision       Perception     Praxis     Communication Communication Communication: Impaired Factors Affecting Communication: Reduced clarity of speech   Cognition Arousal: Alert Behavior During Therapy: Flat affect Cognition: Cognition impaired   Orientation impairments: Time, Situation  Awareness: Intellectual awareness impaired, Online awareness impaired Memory impairment (select all impairments): Declarative long-term memory, Short-term memory, Working memory, Non-declarative long-term memory Attention impairment (select first level of impairment): Sustained attention Executive functioning impairment (select all impairments): Initiation, Organization, Sequencing, Reasoning, Problem solving                   Following commands: Impaired Following commands impaired: Follows  one step commands with increased time (with multi modal cues)      Cueing   Cueing Techniques: Verbal cues, Tactile cues, Visual cues, Gestural cues  Exercises Other Exercises Other Exercises: edu re role of OT, role of rehab, discharge recommendations    Shoulder Instructions       General Comments vss    Pertinent Vitals/ Pain       Pain Assessment Pain Assessment: Faces Faces Pain Scale: Hurts little more Pain Location: generalized Pain Descriptors / Indicators: Discomfort, Grimacing Pain Intervention(s): Monitored during session, Limited activity within patient's tolerance  Home Living                                          Prior Functioning/Environment              Frequency  Min 2X/week        Progress Toward Goals  OT Goals(current goals can now be found in the care plan section)  Progress towards OT goals: Progressing toward goals  Acute Rehab OT Goals Time For Goal Achievement: 09/03/24  Plan      Co-evaluation                 AM-PAC OT 6 Clicks Daily Activity     Outcome Measure   Help from another person eating meals?: A Lot Help from another person taking care of personal grooming?: A Lot Help from another person toileting, which includes using toliet, bedpan, or urinal?: Total Help from another person bathing (including washing, rinsing, drying)?: Total Help from another person to put on and taking off regular upper body clothing?: A Lot Help from another person to put on and taking off regular lower body clothing?: Total 6 Click Score: 9    End of Session    OT Visit Diagnosis: Other abnormalities of gait and mobility (R26.89);Muscle weakness (generalized) (M62.81)   Activity Tolerance Patient tolerated treatment well   Patient Left in bed;with call bell/phone within reach;with bed alarm set   Nurse Communication Mobility status        Time: 8547-8492 OT Time Calculation (min): 15 min  Charges:  OT General Charges $OT Visit: 1 Visit OT Treatments $Therapeutic Activity: 8-22 mins  Therisa Sheffield, OTD OTR/L  08/25/24, 3:49 PM

## 2024-08-25 NOTE — TOC Progression Note (Signed)
 Transition of Care [facility]) - Progression Note    Patient Details  Name: Haley Deleon MRN: [id number] Date of Birth: 1956/07/21  [facility]) CM/SW Contact  Corean ONEIDA Haddock, RN Phone Number: 08/25/2024, 11:37 AM  Clinical Narrative:     Received return call from granddaughter [name] Per MD patient does not have Capacity to make medical decisions  [name] confirms that she is in agreement that patient needs STR.  She states that she has also discussed with patients sister, and she is in agreement   PASRR pending.  Clinical uploaded for review Bed search initiated                     Expected Discharge Plan and Services                                               Social Drivers of Health (SDOH) Interventions SDOH Screenings   Food Insecurity: No Food Insecurity (08/21/2024)  Housing: Unknown (08/21/2024)  Transportation Needs: No Transportation Needs (08/21/2024)  Utilities: Not At Risk (08/21/2024)  Social Connections: Moderately Isolated (08/22/2024)  Tobacco Use: Medium Risk (08/15/2024)    Readmission Risk Interventions     No data to display

## 2024-08-25 NOTE — TOC Progression Note (Addendum)
 Transition of Care [facility]) - Progression Note    Patient Details  Name: Haley Deleon MRN: [id number] Date of Birth: 1956-04-05  [facility]) CM/SW Contact  Corean ONEIDA Haddock, RN Phone Number: 08/25/2024, 3:23 PM  Clinical Narrative:      Call placed to granddaughter [name] and presented bed offers.  She is going to review with patient's sister and call CM back with a decision     Update:  [name] accepts bed at [facility] and [facility].  She is aware that since it is over an 50 mile distance they will have to private pay for transport.  Accepted in HUB.  VM left for Brandi (called main number) in admission notifying that bed has been accepted, and that the facility will need to start auth                Expected Discharge Plan and Services                                               Social Drivers of Health (SDOH) Interventions SDOH Screenings   Food Insecurity: No Food Insecurity (08/21/2024)  Housing: Unknown (08/21/2024)  Transportation Needs: No Transportation Needs (08/21/2024)  Utilities: Not At Risk (08/21/2024)  Social Connections: Moderately Isolated (08/22/2024)  Tobacco Use: Medium Risk (08/15/2024)    Readmission Risk Interventions     No data to display

## 2024-08-25 NOTE — Progress Notes (Signed)
 Progress Note   Patient: Haley Deleon FMW:[id number] DOB: May 31, 1956 DOA: 08/15/2024     10 DOS: the patient was seen and examined on 08/25/2024    Hospital course / significant events:    Haley Deleon is a 68 y.o. female with medical history significant of alcohol use disorder, dCHF, CAD, depression with anxiety, morbid obesity, ulcerative colitis, NSVT, PSVT, varicose vein in both legs, carotid artery stenosis, morbid obesity, who presents with left leg swelling and pain, confusion.    HPI:  initially presented to ED with left leg swelling and pain, and then she developed confusion in ED.  When admitting hospitalist saw patient in ED, she was alert, orientated to place and person, not to the time. She does not have pain/swelling in the right leg.  She denies fall or injury.  No fever or chills.  Per her granddaughter, patient used to drink liquor almost every day. Recently she reportedly drinks less, and last drinking was several days ago.    11/25: admitted to hospitalist w/ acute metabolic encephalopathy EtOH withdrawal, UTI.  11/26: remains confused / hallucinating this morning. Continuing withdrawal protocols  11/27: remains confused but is more calm today. Urine culture still pending.  11/28: remains confused but is more calm today and bit more conversant but not making sense. Urine culture still pending - staff has confirmed w/ lab that it is in process today as of 05:00 11/29: more alert but remains confused. PT/OT ordered for tomorrow. UCX pend susceptibilities  11/30: Proteus and Ecoli both sensitive to rocephin . Febrile this AM so will leave on abx, CXR read as possible R mid-lung atelectasis / early infection, also vascular congestion. Gave dose Lasix  and will add azithro for possible PNA 12/01: continues to be febrile, more oriented but still confused / not conversational so difficult history. No skin lesions. Added vancomycin  given (+)MRSA screen and skin erythema groin, but  likely d/t parainfluenza virus 12/02: febrile but improved, remains somewhat confused, continue abx      Consultants:  none   Procedures/Surgeries: none           ASSESSMENT & PLAN:   Acute metabolic encephalopathy d/t EtOH withdrawal, underlying UTI is probably a complicating factor but not primary cause of encephalopathy  Frequent neurochecks, CIWA Fall precaution Tx UTI as below Patient does not have capacity to make her own medical decision PT recommends skilled nursing facility however patient wanting to go home   UTI: (+)Proteus and Ecoli  Has completed 5 days course of antibiotic therapy   Fever (+)parainfluenzavirus (+)MRSA screen  Groin candida but erythema may also be cellulitis  Abnormal CXR clinically significant, not definitive but possible early PNA Proteus UTI Has completed antibiotic course   Pain and swelling of left lower leg  Chronic venous stasis Ruled out DVT Supportive care   Chronic diastolic CHF (congestive heart failure)  2D echo on 02/24/2021 showed EF 60 to 65%.  Patient has leg edema, but no SOB.  Does not seem to have CHF exacerbation. Equivocal  BNP I&O Diuresis as needed    Ulcerative colitis  No active nausea vomiting or diarrhea.   Continue as needed Imodium    Intertriginous candidiasis:  In groin area. Nystatin  powder topcially   Depression Continue home Lexapro  if able to take po but ok to hold if needed      Class 3 obesity based on BMI: Body mass index is 41.5 kg/m. Healthy nutrition and physical activity advised as adjunct to other disease management and  risk reduction treatments       DVT prophylaxis: lovenox    Code Status: FULL CODE   TOC needs: SNF Medical barriers to dispo: Currently medically stable     Subjective   Patient seen and examined at bedside this morning Denies nausea vomiting abdominal pain chest pain cough Patient currently pending SNF   Examination:  Physical Exam Constitutional:       General: She is not in acute distress.    Appearance: She is ill-appearing. She is not toxic-appearing or diaphoretic.  Cardiovascular:     Rate and Rhythm: Normal rate and regular rhythm.  Pulmonary:     Effort: Pulmonary effort is normal.     Breath sounds: Normal breath sounds.  Abdominal:     General: Bowel sounds are normal.  Musculoskeletal:     Right lower leg: Edema present.     Left lower leg: Edema present.  Skin:    General: Skin is warm and dry.  Neurological: Patient having fluctuating mental status Psychiatric:        Behavior: Behavior normal.      Vitals:   08/24/24 1624 08/24/24 2040 08/25/24 0532 08/25/24 0901  BP: 116/64 118/70 124/75 120/73  Pulse: 77 80 77 72  Resp: 16 16 19 18   Temp: 98.1 F (36.7 C) 98.5 F (36.9 C) 98.4 F (36.9 C) 97.7 F (36.5 C)  TempSrc:  Oral    SpO2: 99% 96% 95% 95%  Weight:      Height:          Latest Ref Rng & Units 08/25/2024    5:18 AM 08/24/2024    4:38 AM 08/23/2024    5:05 AM  BMP  Glucose 70 - 99 mg/dL 896  94  888   BUN 8 - 23 mg/dL 20  23  25    Creatinine 0.44 - 1.00 mg/dL 9.39  9.25  9.16   Sodium 135 - 145 mmol/L 138  135  139   Potassium 3.5 - 5.1 mmol/L 3.8  4.0  3.9   Chloride 98 - 111 mmol/L 105  100  105   CO2 22 - 32 mmol/L 23  26  24    Calcium 8.9 - 10.3 mg/dL 8.7  8.7  8.6      Author: Drue ONEIDA Potter, MD 08/25/2024 6:03 PM  For on call review [email or web address].

## 2024-08-25 NOTE — Plan of Care (Signed)

## 2024-08-25 NOTE — TOC PASRR Note (Signed)
 RE: Haley Deleon Date of Birth: 09/20/2056 Date: 08/25/24      To Whom It May Concern:   Please be advised that the above-named patient will require a short-term nursing home stay - anticipated 30 days or less for rehabilitation and strengthening.  The plan is for return home

## 2024-08-25 NOTE — [place] FL2 (Signed)
 [place]  MEDICAID FL2 LEVEL OF CARE FORM     IDENTIFICATION  Patient Name: Haley Deleon Birthdate: [birth year] Sex: female Admission Date (Current Location): 08/15/2024  [facility] and [place] Number:  Chiropodist and Address:  [facility], [address] [place], [place] [address]      Provider Number: [id number]  Attending Physician Name and Address:  Dorinda Drue DASEN, MD  Relative Name and Phone Number:       Current Level of Care: Hospital Recommended Level of Care: Skilled Nursing Facility Prior Approval Number:    Date Approved/Denied:   PASRR Number: Pending  Discharge Plan: SNF    Current Diagnoses: Patient Active Problem List   Diagnosis Date Noted   UTI (urinary tract infection) 08/16/2024   Acute metabolic encephalopathy 08/16/2024   Alcohol use disorder    Alcohol withdrawal (HCC) 08/15/2024   Chronic diastolic CHF (congestive heart failure) (HCC) 08/15/2024   Intertriginous candidiasis 08/15/2024   Depression 08/15/2024   Pain and swelling of left lower leg 08/15/2024   Obesity, Class III, BMI 40-49.9 (morbid obesity) (HCC) 08/15/2024   PSVT (paroxysmal supraventricular tachycardia) 07/17/2021   Palpitations 02/24/2021   NSVT (nonsustained ventricular tachycardia) (HCC) 02/24/2021   Palpitation 02/24/2021   Varicose veins of lower extremity with pain, bilateral 07/09/2020   Varicose veins of leg with pain, left 06/14/2020   Dizziness 11/17/2017   Murmur, cardiac 11/17/2017   Screening for cardiovascular condition 11/17/2017   Lower extremity edema 11/17/2017   Gallstones and inflammation of gallbladder without obstruction    Incisional hernia, without obstruction or gangrene    History of biliary T-tube placement 02/24/2014   Cellulitis 02/24/2014   Cellulitis and abscess 02/24/2014   Ulcerative colitis (HCC) 08/22/2013    Orientation RESPIRATION BLADDER Height & Weight     Self, Place  Normal Incontinent  Weight: 118 kg Height:  5' 7 (170.2 cm)  BEHAVIORAL SYMPTOMS/MOOD NEUROLOGICAL BOWEL NUTRITION STATUS      Incontinent Diet (dys 2)  AMBULATORY STATUS COMMUNICATION OF NEEDS Skin   Extensive Assist Verbally Skin abrasions, Bruising                       Personal Care Assistance Level of Assistance              Functional Limitations Info             SPECIAL CARE FACTORS FREQUENCY  PT (By licensed PT), OT (By licensed OT)                    Contractures Contractures Info: Not present    Additional Factors Info  Code Status, Isolation Precautions, Allergies Code Status Info: Full Allergies Info: Caffeine, Chocolate, Chocolate Flavoring Agent (Non-screening), Cocoa, Meloxicam     Isolation Precautions Info: (+)parainfluenzavirus     Current Medications (08/25/2024):  This is the current hospital active medication list Current Facility-Administered Medications  Medication Dose Route Frequency Provider Last Rate Last Admin   acetaminophen  (TYLENOL ) suppository 650 mg  650 mg Rectal Q6H PRN Alexander, Natalie, DO   650 mg at 08/22/24 0759   acetaminophen  (TYLENOL ) tablet 650 mg  650 mg Oral Q6H PRN Niu, Xilin, MD   650 mg at 08/23/24 0010   enoxaparin  (LOVENOX ) injection 60 mg  60 mg Subcutaneous Q24H Niu, Xilin, MD   60 mg at 08/25/24 0901   escitalopram  (LEXAPRO ) tablet 10 mg  10 mg Oral Daily Niu, Xilin, MD  10 mg at 08/25/24 0902   feeding supplement (ENSURE PLUS HIGH PROTEIN) liquid 237 mL  237 mL Oral BID BM Alexander, Natalie, DO   237 mL at 08/24/24 9147   folic acid  (FOLVITE ) tablet 1 mg  1 mg Oral Daily Niu, Xilin, MD   1 mg at 08/25/24 9097   hydrALAZINE  (APRESOLINE ) injection 5 mg  5 mg Intravenous Q2H PRN Niu, Xilin, MD       ibuprofen  (ADVIL ) tablet 400 mg  400 mg Oral Q6H PRN Mansy, Jan A, MD   400 mg at 08/21/24 0507   loperamide  (IMODIUM ) capsule 2 mg  2 mg Oral PRN Niu, Xilin, MD   2 mg at 08/19/24 2124   LORazepam  (ATIVAN ) injection 2 mg  2 mg  Intravenous Q6H PRN Alexander, Natalie, DO   2 mg at 08/17/24 1544   multivitamin with minerals tablet 1 tablet  1 tablet Oral Daily Niu, Xilin, MD   1 tablet at 08/25/24 9097   nystatin  (MYCOSTATIN /NYSTOP ) topical powder   Topical BID Niu, Xilin, MD   Given at 08/25/24 9096   ondansetron  (ZOFRAN ) injection 4 mg  4 mg Intravenous Q8H PRN Niu, Xilin, MD       oxyCODONE  (Oxy IR/ROXICODONE ) immediate release tablet 5 mg  5 mg Oral Q6H PRN Niu, Xilin, MD       thiamine  (VITAMIN B1) tablet 100 mg  100 mg Oral Daily Niu, Xilin, MD   100 mg at 08/24/24 9147   Or   thiamine  (VITAMIN B1) injection 100 mg  100 mg Intravenous Daily Niu, Xilin, MD   100 mg at 08/25/24 0901     Discharge Medications: Please see discharge summary for a list of discharge medications.  Relevant Imaging Results:  Relevant Lab Results:   Additional Information ss [id number]  Corean ONEIDA Haddock, RN

## 2024-08-25 NOTE — Progress Notes (Signed)
 Physical Therapy Treatment Patient Details Name: Haley Deleon MRN: [id number] DOB: September 16, 1956 Today's Date: 08/25/2024   History of Present Illness Pt is a 68 y.o. female who presents with left leg swelling and pain, confusion. MD assessment: Acute metabolic encephalopathy, UTI and possible alcohol withdrawal. PMH of alcohol use disorder, dCHF, CAD, depression with anxiety, morbid obesity, ulcerative colitis, NSVT, PSVT, varicose vein in both legs, carotid artery stenosis, morbid obesity    PT Comments  Patient seen for PT session focused on bed mobility. Patient required mod/maxA x2  for bed mobility and used +2 assist to sit EOB . Tolerated session fairly with no signs of exertion or distress. Main limiting factors today were lethargy and emerging signs of self limiting behavior. Interventions aimed at improving functional mobility. Patient shows fair potential to make progress with continued acute level rehab. Patient continues to demonstrate severe activity restrictions and poor tolerance for progressive mobility. Continued skilled PT recommended to progress toward functional goals and support discharge readiness. Pt making good progress toward goals, will continue to follow POC. Discharge recommendation remains appropriate      If plan is discharge home, recommend the following: Two people to help with walking and/or transfers;Two people to help with bathing/dressing/bathroom;Help with stairs or ramp for entrance;Assist for transportation;Assistance with feeding;Assistance with cooking/housework;Direct supervision/assist for financial management;Supervision due to cognitive status   Can travel by private vehicle     No  Equipment Recommendations  Other (comment) (TBD)    Recommendations for Other Services       Precautions / Restrictions Precautions Precautions: Fall Recall of Precautions/Restrictions: Impaired Restrictions Weight Bearing Restrictions Per Provider Order: No      Mobility  Bed Mobility Overal bed mobility: Needs Assistance Bed Mobility: Rolling, Sit to Supine Rolling: +2 for physical assistance, Mod assist   Supine to sit: +2 for physical assistance, Mod assist, Max assist Sit to supine: +2 for physical assistance, Max assist   General bed mobility comments: pt sitting on edge of bed; abel to preform anterior posterior weight shifts    Transfers                        Ambulation/Gait                   Stairs             Wheelchair Mobility     Tilt Bed    Modified Rankin (Stroke Patients Only)       Balance Overall balance assessment: Needs assistance Sitting-balance support: Feet supported Sitting balance-Leahy Scale: Fair Sitting balance - Comments: pt max x2 to maintain seated balance Postural control: Posterior lean                                  Communication Communication Communication: Impaired Factors Affecting Communication: Reduced clarity of speech;Difficulty expressing self  Cognition Arousal: Alert, Lethargic Behavior During Therapy: Flat affect   PT - Cognitive impairments: Awareness, Memory, Attention Difficult to assess due to: Impaired communication, Level of arousal Orientation impairments: Place, Time, Situation                     Following commands: Impaired Following commands impaired: Follows one step commands inconsistently    Cueing Cueing Techniques: Verbal cues, Tactile cues, Visual cues, Gestural cues  Exercises      General Comments  Pertinent Vitals/Pain Pain Assessment Pain Assessment: Faces Faces Pain Scale: Hurts little more Pain Location: neck with placing patient in neutral Pain Descriptors / Indicators: Discomfort, Grimacing Pain Intervention(s): Limited activity within patient's tolerance, Monitored during session    Home Living                          Prior Function            PT Goals (current  goals can now be found in the care plan section) Acute Rehab PT Goals PT Goal Formulation: Patient unable to participate in goal setting    Frequency    Min 2X/week      PT Plan      Co-evaluation              AM-PAC PT 6 Clicks Mobility   Outcome Measure  Help needed turning from your back to your side while in a flat bed without using bedrails?: A Lot Help needed moving from lying on your back to sitting on the side of a flat bed without using bedrails?: A Lot Help needed moving to and from a bed to a chair (including a wheelchair)?: A Lot Help needed standing up from a chair using your arms (e.g., wheelchair or bedside chair)?: Total Help needed to walk in hospital room?: Total Help needed climbing 3-5 steps with a railing? : Total 6 Click Score: 9    End of Session   Activity Tolerance: Patient limited by lethargy Patient left: in bed;with call bell/phone within reach;with bed alarm set Nurse Communication: Mobility status PT Visit Diagnosis: Muscle weakness (generalized) (M62.81);History of falling (Z91.81);Difficulty in walking, not elsewhere classified (R26.2);Other symptoms and signs involving the nervous system (R29.898)     Time: 8577-8556 PT Time Calculation (min) (ACUTE ONLY): 21 min  Charges:      PT General Charges $$ ACUTE PT VISIT: 1 Visit                     Sherlean Lesches DPT, PT     Sherlean A Lashone Stauber 08/25/2024, 2:58 PM

## 2024-08-26 DIAGNOSIS — G9341 Metabolic encephalopathy: Secondary | ICD-10-CM | POA: Diagnosis not present

## 2024-08-26 LAB — CBC WITH DIFFERENTIAL/PLATELET
Abs Immature Granulocytes: 0.04 K/uL (ref 0.00–0.07)
Basophils Absolute: 0 K/uL (ref 0.0–0.1)
Basophils Relative: 1 %
Eosinophils Absolute: 0.3 K/uL (ref 0.0–0.5)
Eosinophils Relative: 4 %
HCT: 42.7 % (ref 36.0–46.0)
Hemoglobin: 14.2 g/dL (ref 12.0–15.0)
Immature Granulocytes: 1 %
Lymphocytes Relative: 18 %
Lymphs Abs: 1.4 K/uL (ref 0.7–4.0)
MCH: 29.8 pg (ref 26.0–34.0)
MCHC: 33.3 g/dL (ref 30.0–36.0)
MCV: 89.7 fL (ref 80.0–100.0)
Monocytes Absolute: 0.7 K/uL (ref 0.1–1.0)
Monocytes Relative: 10 %
Neutro Abs: 5 K/uL (ref 1.7–7.7)
Neutrophils Relative %: 66 %
Platelets: 229 K/uL (ref 150–400)
RBC: 4.76 MIL/uL (ref 3.87–5.11)
RDW: 12.7 % (ref 11.5–15.5)
WBC: 7.5 K/uL (ref 4.0–10.5)
nRBC: 0 % (ref 0.0–0.2)

## 2024-08-26 LAB — BASIC METABOLIC PANEL WITH GFR
Anion gap: 11 (ref 5–15)
BUN: 16 mg/dL (ref 8–23)
CO2: 23 mmol/L (ref 22–32)
Calcium: 8.9 mg/dL (ref 8.9–10.3)
Chloride: 106 mmol/L (ref 98–111)
Creatinine, Ser: 0.62 mg/dL (ref 0.44–1.00)
GFR, Estimated: >60 mL/min (ref >60)
Glucose, Bld: 109 mg/dL — ABNORMAL HIGH (ref 70–99)
Potassium: 3.7 mmol/L (ref 3.5–5.1)
Sodium: 139 mmol/L (ref 135–145)

## 2024-08-26 NOTE — Plan of Care (Signed)

## 2024-08-26 NOTE — Progress Notes (Signed)
 Physical Therapy Treatment Patient Details Name: NKENGE SONNTAG MRN: [id number] DOB: 22-Jun-1956 Today's Date: 08/26/2024   History of Present Illness Pt is a 68 y.o. female who presents with left leg swelling and pain, confusion. MD assessment: Acute metabolic encephalopathy, UTI and possible alcohol withdrawal. PMH of alcohol use disorder, dCHF, CAD, depression with anxiety, morbid obesity, ulcerative colitis, NSVT, PSVT, varicose vein in both legs, carotid artery stenosis, morbid obesity    PT Comments  Patient seen for PT session focused on bed mobility with attempts to sit EOB limited by pt. incontinent. Patient required max x2 for rolling. Tolerated session fair with no signs of exertion or distress. Vitals remained stable during activity. Main limiting factors today were incontinent. Interventions aimed at improving transfers and sitting EOB. Patient shows limited potential to make progress with continued acute level rehab. Patient continues to demonstrate  activity restrictions and poor tolerance for progressive mobility. Continued skilled PT recommended to progress toward functional goals and support discharge readiness. Pt making good progress toward goals, will continue to follow POC. Discharge recommendation remains appropriate     If plan is discharge home, recommend the following: Two people to help with walking and/or transfers;Two people to help with bathing/dressing/bathroom;Help with stairs or ramp for entrance;Assist for transportation;Assistance with feeding;Assistance with cooking/housework;Direct supervision/assist for financial management;Supervision due to cognitive status   Can travel by private vehicle     No  Equipment Recommendations  Other (comment) (TBD)    Recommendations for Other Services       Precautions / Restrictions Precautions Precautions: Fall Recall of Precautions/Restrictions: Impaired Restrictions Weight Bearing Restrictions Per Provider Order: No      Mobility  Bed Mobility Overal bed mobility: Needs Assistance Bed Mobility: Rolling, Supine to Sit, Sit to Supine Rolling: +2 for physical assistance, Max assist         General bed mobility comments: rolls to each side x4 for hygiene and pad change due to patient feces; PT and mobility aid in room prefromed cleaning    Transfers                        Ambulation/Gait                   Stairs             Wheelchair Mobility     Tilt Bed    Modified Rankin (Stroke Patients Only)       Balance                                            Communication Communication Communication: Impaired Factors Affecting Communication: Reduced clarity of speech  Cognition Arousal: Alert Behavior During Therapy: Flat affect   PT - Cognitive impairments: Awareness, Memory, Attention Difficult to assess due to: Impaired communication, Level of arousal Orientation impairments: Place, Time, Situation                     Following commands: Impaired Following commands impaired: Follows one step commands with increased time    Cueing Cueing Techniques: Verbal cues, Tactile cues, Visual cues, Gestural cues  Exercises      General Comments General comments (skin integrity, edema, etc.): while cleaning PT and tech notived anterior/posterior directed cuts x3 in the groin area; RN notified      Pertinent Vitals/Pain Pain Assessment  Pain Assessment: PAINAD Faces Pain Scale: Hurts little more Breathing: normal Negative Vocalization: none Facial Expression: smiling or inexpressive Body Language: relaxed Consolability: no need to console PAINAD Score: 0 Pain Location: generalized Pain Descriptors / Indicators: Discomfort, Grimacing Pain Intervention(s): Limited activity within patient's tolerance, Monitored during session    Home Living                          Prior Function            PT Goals (current  goals can now be found in the care plan section) Acute Rehab PT Goals PT Goal Formulation: Patient unable to participate in goal setting    Frequency    Min 2X/week      PT Plan      Co-evaluation              AM-PAC PT 6 Clicks Mobility   Outcome Measure  Help needed turning from your back to your side while in a flat bed without using bedrails?: A Lot Help needed moving from lying on your back to sitting on the side of a flat bed without using bedrails?: A Lot Help needed moving to and from a bed to a chair (including a wheelchair)?: A Lot Help needed standing up from a chair using your arms (e.g., wheelchair or bedside chair)?: Total Help needed to walk in hospital room?: Total Help needed climbing 3-5 steps with a railing? : Total 6 Click Score: 9    End of Session   Activity Tolerance: Patient limited by lethargy Patient left: in bed;with call bell/phone within reach;with bed alarm set Nurse Communication: Mobility status PT Visit Diagnosis: Muscle weakness (generalized) (M62.81);History of falling (Z91.81);Difficulty in walking, not elsewhere classified (R26.2);Other symptoms and signs involving the nervous system (R29.898)     Time: 8875-8854 PT Time Calculation (min) (ACUTE ONLY): 21 min  Charges:    $Therapeutic Activity: 8-22 mins PT General Charges $$ ACUTE PT VISIT: 1 Visit                     Sherlean Lesches DPT, PT    Sherlean A Yisell Sprunger 08/26/2024, 11:58 AM

## 2024-08-26 NOTE — Progress Notes (Signed)
 Progress Note   Patient: Haley Deleon FMW:[id number] DOB: [birth year] DOA: 08/15/2024     11 DOS: the patient was seen and examined on 08/26/2024      Hospital course / significant events:    Haley Deleon is a 68 y.o. female with medical history significant of alcohol use disorder, dCHF, CAD, depression with anxiety, morbid obesity, ulcerative colitis, NSVT, PSVT, varicose vein in both legs, carotid artery stenosis, morbid obesity, who presents with left leg swelling and pain, confusion.    HPI:  initially presented to ED with left leg swelling and pain, and then she developed confusion in ED.  When admitting hospitalist saw patient in ED, she was alert, orientated to place and person, not to the time. She does not have pain/swelling in the right leg.  She denies fall or injury.  No fever or chills.  Per her granddaughter, patient used to drink liquor almost every day. Recently she reportedly drinks less, and last drinking was several days ago.    11/25: admitted to hospitalist w/ acute metabolic encephalopathy EtOH withdrawal, UTI.  11/26: remains confused / hallucinating this morning. Continuing withdrawal protocols  11/27: remains confused but is more calm today. Urine culture still pending.  11/28: remains confused but is more calm today and bit more conversant but not making sense. Urine culture still pending - staff has confirmed w/ lab that it is in process today as of 05:00 11/29: more alert but remains confused. PT/OT ordered for tomorrow. UCX pend susceptibilities  11/30: Proteus and Ecoli both sensitive to rocephin . Febrile this AM so will leave on abx, CXR read as possible R mid-lung atelectasis / early infection, also vascular congestion. Gave dose Lasix  and will add azithro for possible PNA 12/01: continues to be febrile, more oriented but still confused / not conversational so difficult history. No skin lesions. Added vancomycin  given (+)MRSA screen and skin erythema groin,  but likely d/t parainfluenza virus 12/02: febrile but improved, remains somewhat confused, continue abx      Consultants:  none   Procedures/Surgeries: none     ASSESSMENT & PLAN:   Acute metabolic encephalopathy d/t EtOH withdrawal, underlying UTI is probably a complicating factor but not primary cause of encephalopathy  Frequent neurochecks, CIWA Fall precaution Completed treatment for UTI Patient does not have capacity to make her own medical decision PT recommends skilled nursing facility however patient wanting to go home   UTI: (+)Proteus and Ecoli  Has completed 5 days course of antibiotic therapy   Fever (+)parainfluenzavirus (+)MRSA screen  Groin candida but erythema may also be cellulitis  Abnormal CXR clinically significant, not definitive but possible early PNA Proteus UTI Has completed antibiotic course   Pain and swelling of left lower leg  Chronic venous stasis Ruled out DVT Supportive care   Chronic diastolic CHF (congestive heart failure)  2D echo on 02/24/2021 showed EF 60 to 65%.  Patient has leg edema, but no SOB.  Does not seem to have CHF exacerbation. Equivocal  BNP I&O Diuresis as needed    Ulcerative colitis  No active nausea vomiting or diarrhea.   Continue as needed Imodium    Intertriginous candidiasis:  In groin area. Nystatin  powder topcially   Depression Continue home Lexapro    Class 3 obesity based on BMI: Body mass index is 41.5 kg/m. Healthy nutrition and physical activity advised as adjunct to other disease management and risk reduction treatments       DVT prophylaxis: lovenox    Code Status: FULL CODE  TOC needs: SNF Medical barriers to dispo: Currently medically stable     Subjective   Patient seen and examined at bedside this morning Denies nausea vomiting abdominal pain chest pain cough Patient currently pending SNF   Examination:  Physical Exam Constitutional:      General: She is not in acute  distress.    Appearance: She is ill-appearing. She is not toxic-appearing or diaphoretic.  Cardiovascular:     Rate and Rhythm: Normal rate and regular rhythm.  Pulmonary:     Effort: Pulmonary effort is normal.     Breath sounds: Normal breath sounds.  Abdominal:     General: Bowel sounds are normal.  Musculoskeletal:     Right lower leg: Edema present.     Left lower leg: Edema present.  Skin:    General: Skin is warm and dry.  Neurological: Patient having fluctuating mental status Psychiatric:        Behavior: Behavior normal.        Vitals:   08/26/24 0424 08/26/24 0500 08/26/24 0751 08/26/24 1617  BP: 119/69  (!) 147/83 (!) 149/86  Pulse: 80  88 (!) 101  Resp: 18  20 20   Temp: (!) 97.5 F (36.4 C)  97.7 F (36.5 C) 99.2 F (37.3 C)  TempSrc:      SpO2: 95%  96% 95%  Weight:  118 kg    Height:          Latest Ref Rng & Units 08/26/2024    8:32 AM 08/25/2024    5:18 AM 08/24/2024    4:38 AM  BMP  Glucose 70 - 99 mg/dL 890  896  94   BUN 8 - 23 mg/dL 16  20  23    Creatinine 0.44 - 1.00 mg/dL 9.37  9.39  9.25   Sodium 135 - 145 mmol/L 139  138  135   Potassium 3.5 - 5.1 mmol/L 3.7  3.8  4.0   Chloride 98 - 111 mmol/L 106  105  100   CO2 22 - 32 mmol/L 23  23  26    Calcium 8.9 - 10.3 mg/dL 8.9  8.7  8.7        Latest Ref Rng & Units 08/26/2024    8:32 AM 08/25/2024    5:18 AM 08/24/2024    4:38 AM  CBC  WBC 4.0 - 10.5 K/uL 7.5  7.3  7.2   Hemoglobin 12.0 - 15.0 g/dL 85.7  85.6  85.8   Hematocrit 36.0 - 46.0 % 42.7  43.0  42.5   Platelets 150 - 400 K/uL 229  210  190      Author: Drue ONEIDA Potter, MD 08/26/2024 6:18 PM  For on call review [email or web address].

## 2024-08-27 LAB — CBC WITH DIFFERENTIAL/PLATELET
Abs Immature Granulocytes: 0.05 K/uL (ref 0.00–0.07)
Basophils Absolute: 0.1 K/uL (ref 0.0–0.1)
Basophils Relative: 1 %
Eosinophils Absolute: 0.3 K/uL (ref 0.0–0.5)
Eosinophils Relative: 4 %
HCT: 46.9 % — ABNORMAL HIGH (ref 36.0–46.0)
Hemoglobin: 15 g/dL (ref 12.0–15.0)
Immature Granulocytes: 1 %
Lymphocytes Relative: 17 %
Lymphs Abs: 1.5 K/uL (ref 0.7–4.0)
MCH: 29.6 pg (ref 26.0–34.0)
MCHC: 32 g/dL (ref 30.0–36.0)
MCV: 92.5 fL (ref 80.0–100.0)
Monocytes Absolute: 0.8 K/uL (ref 0.1–1.0)
Monocytes Relative: 10 %
Neutro Abs: 5.9 K/uL (ref 1.7–7.7)
Neutrophils Relative %: 67 %
Platelets: 289 K/uL (ref 150–400)
RBC: 5.07 MIL/uL (ref 3.87–5.11)
RDW: 12.7 % (ref 11.5–15.5)
WBC: 8.7 K/uL (ref 4.0–10.5)
nRBC: 0 % (ref 0.0–0.2)

## 2024-08-27 LAB — BASIC METABOLIC PANEL WITH GFR
Anion gap: 13 (ref 5–15)
BUN: 15 mg/dL (ref 8–23)
CO2: 22 mmol/L (ref 22–32)
Calcium: 9.4 mg/dL (ref 8.9–10.3)
Chloride: 104 mmol/L (ref 98–111)
Creatinine, Ser: 0.68 mg/dL (ref 0.44–1.00)
GFR, Estimated: >60 mL/min (ref >60)
Glucose, Bld: 122 mg/dL — ABNORMAL HIGH (ref 70–99)
Potassium: 4 mmol/L (ref 3.5–5.1)
Sodium: 139 mmol/L (ref 135–145)

## 2024-08-27 LAB — GLUCOSE, CAPILLARY: Glucose-Capillary: 112 mg/dL — ABNORMAL HIGH (ref 70–99)

## 2024-08-27 NOTE — Plan of Care (Signed)

## 2024-08-27 NOTE — Plan of Care (Signed)

## 2024-08-27 NOTE — Progress Notes (Addendum)
 PROGRESS NOTE    Haley Deleon  FMW:[id number] DOB: 22-Nov-1955 DOA: 08/15/2024 PCP: Ricard Tawni KIDD, MD    Assessment & Plan:   Principal Problem:   Acute metabolic encephalopathy Active Problems:   UTI (urinary tract infection)   Alcohol withdrawal (HCC)   Pain and swelling of left lower leg   Chronic diastolic CHF (congestive heart failure) (HCC)   Ulcerative colitis (HCC)   Intertriginous candidiasis   Depression   Obesity, Class III, BMI 40-49.9 (morbid obesity) (HCC)  Assessment and Plan:  Acute metabolic encephalopathy: likely multifactorial, ethanol w/drawal, UTI and parainfluenza virus. Re-orient prn. Continue w/ supportive care   UTI: urine cx grew proteus and e. coli. Completed abx course  Parainfluenza virus: continue w/ supportive care.   Groin candida: continue on nystatin   Unlikely pneumonia: repeat CXR on 08/22/24 shows no acute abnormalities. Previous XR findings likely secondary to atelectasis    Chronic venous stasis: w/ pain and swelling of LLE. No DVT in LLE US     Chronic diastolic CHF: echo on 02/24/2021 showed EF 60 to 65%. Appears compensated. Monitor I/Os   Depression: severity unknown. Continue on home dose of lexapro    Obesity: BMI 39.3. Complicates overall care & prognosis         DVT prophylaxis: Lovenox  Code Status: full  Family Communication: discussed pt's care w/ pt's granddaughter, [name], and answered her questions Disposition Plan: likely d/c to SNF  Level of care: Med-Surg  Status is: Inpatient Remains inpatient appropriate because: AMS still     Consultants:    Procedures:   Antimicrobials:   Subjective: Pt is pleasantly confused   Objective: Vitals:   08/26/24 1928 08/26/24 2103 08/27/24 0344 08/27/24 0854  BP: 138/67  (!) 140/80 (!) 109/57  Pulse: 86  92 93  Resp:  18  16  Temp:   99.1 F (37.3 C) 99.3 F (37.4 C)  TempSrc: Oral     SpO2: 96%  94% 97%  Weight:   114 kg   Height:         Intake/Output Summary (Last 24 hours) at 08/27/2024 0926 Last data filed at 08/26/2024 2103 Gross per 24 hour  Intake 100 ml  Output --  Net 100 ml   Filed Weights   08/24/24 0452 08/26/24 0500 08/27/24 0344  Weight: 118 kg 118 kg 114 kg    Examination:  General exam: Appears calm and comfortable  Respiratory system: decreased breath sounds b/l  Cardiovascular system: S1 & S2+. No  rubs, gallops or clicks.  Gastrointestinal system: Abdomen is obese, soft and nontender. Normal bowel sounds heard. Central nervous system: Alert and awake.  Psychiatry: Judgement and insight appears poor. Flat mood and affect   Data Reviewed: I have personally reviewed following labs and imaging studies  CBC: Recent Labs  Lab 08/21/24 0819 08/22/24 0358 08/23/24 0505 08/24/24 0438 08/25/24 0518 08/26/24 0832 08/27/24 0530  WBC 6.9   < > 6.4 7.2 7.3 7.5 8.7  NEUTROABS 4.0  --   --  4.1 4.3 5.0 5.9  HGB 14.7   < > 14.3 14.1 14.3 14.2 15.0  HCT 44.5   < > 44.3 42.5 43.0 42.7 46.9*  MCV 91.6   < > 91.2 90.4 90.3 89.7 92.5  PLT 212   < > 194 190 210 229 289   < > = values in this interval not displayed.   Basic Metabolic Panel: Recent Labs  Lab 08/23/24 0505 08/24/24 0438 08/25/24 0518 08/26/24 0832 08/27/24 0530  NA  139 135 138 139 139  K 3.9 4.0 3.8 3.7 4.0  CL 105 100 105 106 104  CO2 24 26 23 23 22   GLUCOSE 111* 94 103* 109* 122*  BUN 25* 23 20 16 15   CREATININE 0.83 0.74 0.60 0.62 0.68  CALCIUM 8.6* 8.7* 8.7* 8.9 9.4   GFR: Estimated Creatinine Clearance: 89 mL/min (by C-G formula based on SCr of 0.68 mg/dL). Liver Function Tests: Recent Labs  Lab 08/22/24 0358  AST 68*  ALT 44  ALKPHOS 86  BILITOT 0.4  PROT 6.9  ALBUMIN 3.3*   No results for input(s): LIPASE, AMYLASE in the last 168 hours. No results for input(s): AMMONIA in the last 168 hours. Coagulation Profile: No results for input(s): INR, PROTIME in the last 168 hours. Cardiac Enzymes: No  results for input(s): CKTOTAL, CKMB, CKMBINDEX, TROPONINI in the last 168 hours. BNP (last 3 results) Recent Labs    08/15/24 2238  PROBNP 483.0*   HbA1C: No results for input(s): HGBA1C in the last 72 hours. CBG: No results for input(s): GLUCAP in the last 168 hours. Lipid Profile: No results for input(s): CHOL, HDL, LDLCALC, TRIG, CHOLHDL, LDLDIRECT in the last 72 hours. Thyroid Function Tests: No results for input(s): TSH, T4TOTAL, FREET4, T3FREE, THYROIDAB in the last 72 hours. Anemia Panel: No results for input(s): VITAMINB12, FOLATE, FERRITIN, TIBC, IRON, RETICCTPCT in the last 72 hours. Sepsis Labs: No results for input(s): PROCALCITON, LATICACIDVEN in the last 168 hours.  Recent Results (from the past 240 hours)  Resp panel by RT-PCR (RSV, Flu A&B, Covid) Anterior Nasal Swab     Status: None   Collection Time: 08/20/24  5:34 PM   Specimen: Anterior Nasal Swab  Result Value Ref Range Status   SARS Coronavirus 2 by RT PCR NEGATIVE NEGATIVE Final    Comment: (NOTE) SARS-CoV-2 target nucleic acids are NOT DETECTED.  The SARS-CoV-2 RNA is generally detectable in upper respiratory specimens during the acute phase of infection. The lowest concentration of SARS-CoV-2 viral copies this assay can detect is 138 copies/mL. A negative result does not preclude SARS-Cov-2 infection and should not be used as the sole basis for treatment or other patient management decisions. A negative result may occur with  improper specimen collection/handling, submission of specimen other than nasopharyngeal swab, presence of viral mutation(s) within the areas targeted by this assay, and inadequate number of viral copies(<138 copies/mL). A negative result must be combined with clinical observations, patient history, and epidemiological information. The expected result is Negative.  Fact Sheet for Patients:   bloggercourse.com  Fact Sheet for Healthcare Providers:  seriousbroker.it  This test is no t yet approved or cleared by the [place]  FDA and  has been authorized for detection and/or diagnosis of SARS-CoV-2 by FDA under an Emergency Use Authorization (EUA). This EUA will remain  in effect (meaning this test can be used) for the duration of the COVID-19 declaration under Section 564(b)(1) of the Act, 21 U.S.C.section 360bbb-3(b)(1), unless the authorization is terminated  or revoked sooner.       Influenza A by PCR NEGATIVE NEGATIVE Final   Influenza B by PCR NEGATIVE NEGATIVE Final    Comment: (NOTE) The Xpert Xpress SARS-CoV-2/FLU/RSV plus assay is intended as an aid in the diagnosis of influenza from Nasopharyngeal swab specimens and should not be used as a sole basis for treatment. Nasal washings and aspirates are unacceptable for Xpert Xpress SARS-CoV-2/FLU/RSV testing.  Fact Sheet for Patients: bloggercourse.com  Fact Sheet for Healthcare Providers: seriousbroker.it  This test is not yet approved or cleared by the [place]  FDA and has been authorized for detection and/or diagnosis of SARS-CoV-2 by FDA under an Emergency Use Authorization (EUA). This EUA will remain in effect (meaning this test can be used) for the duration of the COVID-19 declaration under Section 564(b)(1) of the Act, 21 U.S.C. section 360bbb-3(b)(1), unless the authorization is terminated or revoked.     Resp Syncytial Virus by PCR NEGATIVE NEGATIVE Final    Comment: (NOTE) Fact Sheet for Patients: bloggercourse.com  Fact Sheet for Healthcare Providers: seriousbroker.it  This test is not yet approved or cleared by the [place]  FDA and has been authorized for detection and/or diagnosis of SARS-CoV-2 by FDA under an Emergency Use  Authorization (EUA). This EUA will remain in effect (meaning this test can be used) for the duration of the COVID-19 declaration under Section 564(b)(1) of the Act, 21 U.S.C. section 360bbb-3(b)(1), unless the authorization is terminated or revoked.  Performed at [facility], [address] [place], [place] [address]   Respiratory (~20 pathogens) panel by PCR     Status: Abnormal   Collection Time: 08/21/24  8:23 AM   Specimen: Nasopharyngeal Swab; Respiratory  Result Value Ref Range Status   Adenovirus NOT DETECTED NOT DETECTED Final   Coronavirus 229E NOT DETECTED NOT DETECTED Final    Comment: (NOTE) The Coronavirus on the Respiratory Panel, DOES NOT test for the novel  Coronavirus (2019 nCoV)    Coronavirus HKU1 NOT DETECTED NOT DETECTED Final   Coronavirus NL63 NOT DETECTED NOT DETECTED Final   Coronavirus OC43 NOT DETECTED NOT DETECTED Final   Metapneumovirus NOT DETECTED NOT DETECTED Final   Rhinovirus / Enterovirus NOT DETECTED NOT DETECTED Final   Influenza A NOT DETECTED NOT DETECTED Final   Influenza B NOT DETECTED NOT DETECTED Final   Parainfluenza Virus 1 DETECTED (A) NOT DETECTED Final   Parainfluenza Virus 2 NOT DETECTED NOT DETECTED Final   Parainfluenza Virus 3 NOT DETECTED NOT DETECTED Final   Parainfluenza Virus 4 NOT DETECTED NOT DETECTED Final   Respiratory Syncytial Virus NOT DETECTED NOT DETECTED Final   Bordetella pertussis NOT DETECTED NOT DETECTED Final   Bordetella Parapertussis NOT DETECTED NOT DETECTED Final   Chlamydophila pneumoniae NOT DETECTED NOT DETECTED Final   Mycoplasma pneumoniae NOT DETECTED NOT DETECTED Final    Comment: Performed at [facility], [address] [place], [place] [address]  MRSA Next Gen by PCR, Nasal     Status: Abnormal   Collection Time: 08/21/24  8:23 AM   Specimen: Nasal Mucosa; Nasal Swab  Result Value Ref Range Status   MRSA by PCR Next Gen DETECTED (A) NOT DETECTED Final    Comment: RESULT  CALLED TO, READ BACK BY AND VERIFIED WITH: ALLIE T. RN 1032 08/21/24 HNM (NOTE) The GeneXpert MRSA Assay (FDA approved for NASAL specimens only), is one component of a comprehensive MRSA colonization surveillance program. It is not intended to diagnose MRSA infection nor to guide or monitor treatment for MRSA infections. Test performance is not FDA approved in patients less than 19 years old. Performed at [facility], [address] [place], [place] [address]          Radiology Studies: No results found.      Scheduled Meds:  enoxaparin  (LOVENOX ) injection  60 mg Subcutaneous Q24H   escitalopram   10 mg Oral Daily   feeding supplement  237 mL Oral BID BM   folic acid   1  mg Oral Daily   multivitamin with minerals  1 tablet Oral Daily   nystatin    Topical BID   thiamine   100 mg Oral Daily   Or   thiamine   100 mg Intravenous Daily   Continuous Infusions:   LOS: 12 days       Anthony CHRISTELLA Pouch, MD [facility] Pager [phone]  If 7PM-7AM, please contact night-coverage [email or web address] 08/27/2024, 9:26 AM

## 2024-08-28 LAB — PROCALCITONIN: Procalcitonin: 0.1 ng/mL

## 2024-08-28 MED ORDER — ACETAMINOPHEN 650 MG RE SUPP
650.0000 mg | RECTAL | Status: AC | PRN
  Administered 2024-08-28 – 2024-08-29 (×4): 650 mg via RECTAL
  Filled 2024-08-28 (×5): qty 1

## 2024-08-28 NOTE — Plan of Care (Signed)

## 2024-08-28 NOTE — Progress Notes (Signed)
 Pt more lethargic, responds to pain only, hard to arouse, provider notified.  VSS, BG stable.  Will continue to monitor.

## 2024-08-28 NOTE — Progress Notes (Signed)
 PROGRESS NOTE    Haley Deleon  FMW:[id number] DOB: 1956-04-02 DOA: 08/15/2024 PCP: Ricard Tawni KIDD, MD    Assessment & Plan:   Principal Problem:   Acute metabolic encephalopathy Active Problems:   UTI (urinary tract infection)   Alcohol withdrawal (HCC)   Pain and swelling of left lower leg   Chronic diastolic CHF (congestive heart failure) (HCC)   Ulcerative colitis (HCC)   Intertriginous candidiasis   Depression   Obesity, Class III, BMI 40-49.9 (morbid obesity) (HCC)  Assessment and Plan:  Acute metabolic encephalopathy: likely multifactorial, ethanol w/drawal, UTI and parainfluenza virus. Re-orient prn. Mental status waxes and wanes. Continue w/ supportive care    UTI: urine cx grew proteus and e. coli. Completed abx course  Parainfluenza virus: continue w/ supportive care   Fever: possibly secondary to parainfluenza virus. Procal, blood cxs ordered. Tylenol  prn   Groin candida: continue on nystatin    Unlikely pneumonia: repeat CXR on 08/22/24 shows no acute abnormalities. Previous XR findings likely secondary to atelectasis    Chronic venous stasis: w/ pain and swelling of LLE. US  of LLE was neg for DVT    Chronic diastolic CHF: echo on 02/24/2021 showed EF 60 to 65%. Appears compensated. Monitor I/Os   Depression: severity unknown. Continue on home dose of lexapro     Obesity: BMI 39.3. Complicates overall care & prognosis         DVT prophylaxis: Lovenox  Code Status: full  Family Communication: Disposition Plan: likely d/c to SNF  Level of care: Med-Surg  Status is: Inpatient Remains inpatient appropriate because: AMS still     Consultants:    Procedures:   Antimicrobials:   Subjective: Pt is still confused   Objective: Vitals:   08/28/24 0548 08/28/24 0648 08/28/24 0649 08/28/24 0818  BP:    136/80  Pulse:      Resp:    18  Temp: 98.4 F (36.9 C) 98 F (36.7 C) 99.4 F (37.4 C) 100 F (37.8 C)  TempSrc: Oral Oral Axillary  Axillary  SpO2:    97%  Weight:      Height:       No intake or output data in the 24 hours ending 08/28/24 0918  Filed Weights   08/26/24 0500 08/27/24 0344 08/28/24 0500  Weight: 118 kg 114 kg 117.3 kg    Examination:  General exam: Appear lethargic  Respiratory system: diminished breath sounds b/l  Cardiovascular system: S1/S2+. No rubs or clicks Gastrointestinal system: Abd is soft, NT, obese & hypoactive bowel sounds Central nervous system: lethargic  Psychiatry: Judgement and insight appears poor    Data Reviewed: I have personally reviewed following labs and imaging studies  CBC: Recent Labs  Lab 08/23/24 0505 08/24/24 0438 08/25/24 0518 08/26/24 0832 08/27/24 0530  WBC 6.4 7.2 7.3 7.5 8.7  NEUTROABS  --  4.1 4.3 5.0 5.9  HGB 14.3 14.1 14.3 14.2 15.0  HCT 44.3 42.5 43.0 42.7 46.9*  MCV 91.2 90.4 90.3 89.7 92.5  PLT 194 190 210 229 289   Basic Metabolic Panel: Recent Labs  Lab 08/23/24 0505 08/24/24 0438 08/25/24 0518 08/26/24 0832 08/27/24 0530  NA 139 135 138 139 139  K 3.9 4.0 3.8 3.7 4.0  CL 105 100 105 106 104  CO2 24 26 23 23 22   GLUCOSE 111* 94 103* 109* 122*  BUN 25* 23 20 16 15   CREATININE 0.83 0.74 0.60 0.62 0.68  CALCIUM 8.6* 8.7* 8.7* 8.9 9.4   GFR: Estimated Creatinine Clearance:  90.4 mL/min (by C-G formula based on SCr of 0.68 mg/dL). Liver Function Tests: Recent Labs  Lab 08/22/24 0358  AST 68*  ALT 44  ALKPHOS 86  BILITOT 0.4  PROT 6.9  ALBUMIN 3.3*   No results for input(s): LIPASE, AMYLASE in the last 168 hours. No results for input(s): AMMONIA in the last 168 hours. Coagulation Profile: No results for input(s): INR, PROTIME in the last 168 hours. Cardiac Enzymes: No results for input(s): CKTOTAL, CKMB, CKMBINDEX, TROPONINI in the last 168 hours. BNP (last 3 results) Recent Labs    08/15/24 2238  PROBNP 483.0*   HbA1C: No results for input(s): HGBA1C in the last 72 hours. CBG: Recent Labs   Lab 08/27/24 2307  GLUCAP 112*   Lipid Profile: No results for input(s): CHOL, HDL, LDLCALC, TRIG, CHOLHDL, LDLDIRECT in the last 72 hours. Thyroid Function Tests: No results for input(s): TSH, T4TOTAL, FREET4, T3FREE, THYROIDAB in the last 72 hours. Anemia Panel: No results for input(s): VITAMINB12, FOLATE, FERRITIN, TIBC, IRON, RETICCTPCT in the last 72 hours. Sepsis Labs: No results for input(s): PROCALCITON, LATICACIDVEN in the last 168 hours.  Recent Results (from the past 240 hours)  Resp panel by RT-PCR (RSV, Flu A&B, Covid) Anterior Nasal Swab     Status: None   Collection Time: 08/20/24  5:34 PM   Specimen: Anterior Nasal Swab  Result Value Ref Range Status   SARS Coronavirus 2 by RT PCR NEGATIVE NEGATIVE Final    Comment: (NOTE) SARS-CoV-2 target nucleic acids are NOT DETECTED.  The SARS-CoV-2 RNA is generally detectable in upper respiratory specimens during the acute phase of infection. The lowest concentration of SARS-CoV-2 viral copies this assay can detect is 138 copies/mL. A negative result does not preclude SARS-Cov-2 infection and should not be used as the sole basis for treatment or other patient management decisions. A negative result may occur with  improper specimen collection/handling, submission of specimen other than nasopharyngeal swab, presence of viral mutation(s) within the areas targeted by this assay, and inadequate number of viral copies(<138 copies/mL). A negative result must be combined with clinical observations, patient history, and epidemiological information. The expected result is Negative.  Fact Sheet for Patients:  bloggercourse.com  Fact Sheet for Healthcare Providers:  seriousbroker.it  This test is no t yet approved or cleared by the [place]  FDA and  has been authorized for detection and/or diagnosis of SARS-CoV-2 by FDA under an  Emergency Use Authorization (EUA). This EUA will remain  in effect (meaning this test can be used) for the duration of the COVID-19 declaration under Section 564(b)(1) of the Act, 21 U.S.C.section 360bbb-3(b)(1), unless the authorization is terminated  or revoked sooner.       Influenza A by PCR NEGATIVE NEGATIVE Final   Influenza B by PCR NEGATIVE NEGATIVE Final    Comment: (NOTE) The Xpert Xpress SARS-CoV-2/FLU/RSV plus assay is intended as an aid in the diagnosis of influenza from Nasopharyngeal swab specimens and should not be used as a sole basis for treatment. Nasal washings and aspirates are unacceptable for Xpert Xpress SARS-CoV-2/FLU/RSV testing.  Fact Sheet for Patients: bloggercourse.com  Fact Sheet for Healthcare Providers: seriousbroker.it  This test is not yet approved or cleared by the [place]  FDA and has been authorized for detection and/or diagnosis of SARS-CoV-2 by FDA under an Emergency Use Authorization (EUA). This EUA will remain in effect (meaning this test can be used) for the duration of the COVID-19 declaration under Section 564(b)(1) of the Act, 21 U.S.C.  section 360bbb-3(b)(1), unless the authorization is terminated or revoked.     Resp Syncytial Virus by PCR NEGATIVE NEGATIVE Final    Comment: (NOTE) Fact Sheet for Patients: bloggercourse.com  Fact Sheet for Healthcare Providers: seriousbroker.it  This test is not yet approved or cleared by the [place]  FDA and has been authorized for detection and/or diagnosis of SARS-CoV-2 by FDA under an Emergency Use Authorization (EUA). This EUA will remain in effect (meaning this test can be used) for the duration of the COVID-19 declaration under Section 564(b)(1) of the Act, 21 U.S.C. section 360bbb-3(b)(1), unless the authorization is terminated or revoked.  Performed at [facility], [address] [place], [place] [address]   Respiratory (~20 pathogens) panel by PCR     Status: Abnormal   Collection Time: 08/21/24  8:23 AM   Specimen: Nasopharyngeal Swab; Respiratory  Result Value Ref Range Status   Adenovirus NOT DETECTED NOT DETECTED Final   Coronavirus 229E NOT DETECTED NOT DETECTED Final    Comment: (NOTE) The Coronavirus on the Respiratory Panel, DOES NOT test for the novel  Coronavirus (2019 nCoV)    Coronavirus HKU1 NOT DETECTED NOT DETECTED Final   Coronavirus NL63 NOT DETECTED NOT DETECTED Final   Coronavirus OC43 NOT DETECTED NOT DETECTED Final   Metapneumovirus NOT DETECTED NOT DETECTED Final   Rhinovirus / Enterovirus NOT DETECTED NOT DETECTED Final   Influenza A NOT DETECTED NOT DETECTED Final   Influenza B NOT DETECTED NOT DETECTED Final   Parainfluenza Virus 1 DETECTED (A) NOT DETECTED Final   Parainfluenza Virus 2 NOT DETECTED NOT DETECTED Final   Parainfluenza Virus 3 NOT DETECTED NOT DETECTED Final   Parainfluenza Virus 4 NOT DETECTED NOT DETECTED Final   Respiratory Syncytial Virus NOT DETECTED NOT DETECTED Final   Bordetella pertussis NOT DETECTED NOT DETECTED Final   Bordetella Parapertussis NOT DETECTED NOT DETECTED Final   Chlamydophila pneumoniae NOT DETECTED NOT DETECTED Final   Mycoplasma pneumoniae NOT DETECTED NOT DETECTED Final    Comment: Performed at [facility], [address] [place], [place] [address]  MRSA Next Gen by PCR, Nasal     Status: Abnormal   Collection Time: 08/21/24  8:23 AM   Specimen: Nasal Mucosa; Nasal Swab  Result Value Ref Range Status   MRSA by PCR Next Gen DETECTED (A) NOT DETECTED Final    Comment: RESULT CALLED TO, READ BACK BY AND VERIFIED WITH: ALLIE T. RN 1032 08/21/24 HNM (NOTE) The GeneXpert MRSA Assay (FDA approved for NASAL specimens only), is one component of a comprehensive MRSA colonization surveillance program. It is not intended to diagnose MRSA infection nor to guide or  monitor treatment for MRSA infections. Test performance is not FDA approved in patients less than 31 years old. Performed at [facility], [address] [place], [place] [address]          Radiology Studies: No results found.      Scheduled Meds:  enoxaparin  (LOVENOX ) injection  60 mg Subcutaneous Q24H   escitalopram   10 mg Oral Daily   feeding supplement  237 mL Oral BID BM   folic acid   1 mg Oral Daily   multivitamin with minerals  1 tablet Oral Daily   nystatin    Topical BID   thiamine   100 mg Oral Daily   Or   thiamine   100 mg Intravenous Daily   Continuous Infusions:   LOS: 13 days       Anthony CHRISTELLA Pouch, MD  [facility] Pager [phone]  If 7PM-7AM, please contact night-coverage [email or web address] 08/28/2024, 9:18 AM

## 2024-08-28 NOTE — Progress Notes (Signed)
 Patient unable to respond enough to take oral medications. Attending aware. This nurse will CTM.

## 2024-08-28 NOTE — TOC Progression Note (Addendum)
 Transition of Care [facility]) - Progression Note    Patient Details  Name: Haley Deleon MRN: [id number] Date of Birth: [birth year]  [facility] CM/SW Contact  Corean ONEIDA Haddock, RN Phone Number: 08/28/2024, 9:24 AM  Clinical Narrative:    PASRR received [phone] E valid through 09/27/24    Call placed to Admissions at [facility] to determine if they received my message on Friday and started auth.  Spoke with [place], she states that she will have to call the liaison to confirm it is a valid bed offer.  Awaiting a return call      Update:  [place] no longer able offer a bed Granddaughter Forensic Psychologist bed at [employer] with Jon at [facility].  She confirms they can offer, and will start auth when appropriate Per MD not medically appropriate today             Expected Discharge Plan and Services                                               Social Drivers of Health (SDOH) Interventions SDOH Screenings   Food Insecurity: No Food Insecurity (08/21/2024)  Housing: Unknown (08/25/2024)  Transportation Needs: No Transportation Needs (08/21/2024)  Utilities: Not At Risk (08/21/2024)  Social Connections: Moderately Isolated (08/22/2024)  Tobacco Use: Medium Risk (08/15/2024)    Readmission Risk Interventions     No data to display

## 2024-08-28 NOTE — Progress Notes (Signed)
 Tmax 101.5 F, cold towels placed on head, under arms and around neck to cool down.  Pt still lethargic and not following commands to swallow, aspirations risk.  Temp came down to normal, 99.4 F.  Will continue to monitor.

## 2024-08-28 NOTE — Progress Notes (Signed)
 PT Cancellation Note  Patient Details Name: Haley Deleon MRN: [id number] DOB: 1956-07-22   Cancelled Treatment:    Reason Eval/Treat Not Completed: Patient not medically ready  Pt feeling poorly today and not appropriate for session.  Fever and coughing actively.  Will return at later time/date.   Lauraine Gills 08/28/2024, 1:40 PM

## 2024-08-28 NOTE — Progress Notes (Signed)
 Patient is still not responding to voice. Will respond to pain while turning in the bed. Patient tends to tremor more when touched. Notified attending of her fever though out the day. Patient has not had anything to eat or drink. MD aware.

## 2024-08-29 ENCOUNTER — Inpatient Hospital Stay

## 2024-08-29 DIAGNOSIS — R509 Fever, unspecified: Secondary | ICD-10-CM

## 2024-08-29 DIAGNOSIS — J122 Parainfluenza virus pneumonia: Secondary | ICD-10-CM | POA: Diagnosis not present

## 2024-08-29 DIAGNOSIS — Z930 Tracheostomy status: Secondary | ICD-10-CM | POA: Diagnosis not present

## 2024-08-29 DIAGNOSIS — R4182 Altered mental status, unspecified: Secondary | ICD-10-CM | POA: Diagnosis not present

## 2024-08-29 DIAGNOSIS — B9562 Methicillin resistant Staphylococcus aureus infection as the cause of diseases classified elsewhere: Secondary | ICD-10-CM | POA: Diagnosis not present

## 2024-08-29 DIAGNOSIS — N390 Urinary tract infection, site not specified: Secondary | ICD-10-CM | POA: Diagnosis not present

## 2024-08-29 DIAGNOSIS — J690 Pneumonitis due to inhalation of food and vomit: Secondary | ICD-10-CM | POA: Diagnosis not present

## 2024-08-29 DIAGNOSIS — Z87891 Personal history of nicotine dependence: Secondary | ICD-10-CM | POA: Diagnosis not present

## 2024-08-29 DIAGNOSIS — B965 Pseudomonas (aeruginosa) (mallei) (pseudomallei) as the cause of diseases classified elsewhere: Secondary | ICD-10-CM | POA: Diagnosis not present

## 2024-08-29 LAB — URINALYSIS, COMPLETE (UACMP) WITH MICROSCOPIC
Bilirubin Urine: NEGATIVE
Glucose, UA: NEGATIVE mg/dL
Hgb urine dipstick: NEGATIVE
Ketones, ur: NEGATIVE mg/dL
Leukocytes,Ua: NEGATIVE
Nitrite: NEGATIVE
Protein, ur: NEGATIVE mg/dL
Specific Gravity, Urine: 1.024 (ref 1.005–1.030)
pH: 5 (ref 5.0–8.0)

## 2024-08-29 LAB — COMPREHENSIVE METABOLIC PANEL WITH GFR
ALT: 71 U/L — ABNORMAL HIGH (ref 0–44)
AST: 71 U/L — ABNORMAL HIGH (ref 15–41)
Albumin: 3.5 g/dL (ref 3.5–5.0)
Alkaline Phosphatase: 109 U/L (ref 38–126)
Anion gap: 16 — ABNORMAL HIGH (ref 5–15)
BUN: 16 mg/dL (ref 8–23)
CO2: 22 mmol/L (ref 22–32)
Calcium: 9.3 mg/dL (ref 8.9–10.3)
Chloride: 108 mmol/L (ref 98–111)
Creatinine, Ser: 0.77 mg/dL (ref 0.44–1.00)
GFR, Estimated: >60 mL/min (ref >60)
Glucose, Bld: 118 mg/dL — ABNORMAL HIGH (ref 70–99)
Potassium: 4 mmol/L (ref 3.5–5.1)
Sodium: 146 mmol/L — ABNORMAL HIGH (ref 135–145)
Total Bilirubin: 0.7 mg/dL (ref 0.0–1.2)
Total Protein: 7.1 g/dL (ref 6.5–8.1)

## 2024-08-29 LAB — CBC
HCT: 44.9 % (ref 36.0–46.0)
Hemoglobin: 14.7 g/dL (ref 12.0–15.0)
MCH: 29.8 pg (ref 26.0–34.0)
MCHC: 32.7 g/dL (ref 30.0–36.0)
MCV: 90.9 fL (ref 80.0–100.0)
Platelets: 353 K/uL (ref 150–400)
RBC: 4.94 MIL/uL (ref 3.87–5.11)
RDW: 13.1 % (ref 11.5–15.5)
WBC: 9.7 K/uL (ref 4.0–10.5)
nRBC: 0 % (ref 0.0–0.2)

## 2024-08-29 LAB — AMMONIA: Ammonia: 25 umol/L (ref 9–35)

## 2024-08-29 MED ORDER — DEXTROSE 5 % IV SOLN: INTRAVENOUS | Status: AC

## 2024-08-29 NOTE — Progress Notes (Signed)
 OT Cancellation Note  Patient Details Name: Haley Deleon MRN: [id number] DOB: September 03, 1956   Cancelled Treatment:    Reason Eval/Treat Not Completed: Medical issues which prohibited therapy;Other (comment) (per RN, patient not appropriate; currently unresponsive, waiting on CT and chest xray results)  [name] Clause 08/29/2024, 11:48 AM

## 2024-08-29 NOTE — Consult Note (Signed)
 Infectious Disease     Reason for Consult:AMS, chills   Referring Physician: Trudy Anthony HERO, MD  Date of Admission:  08/15/2024   Principal Problem:   Acute metabolic encephalopathy Active Problems:   Ulcerative colitis (HCC)   Alcohol withdrawal (HCC)   Chronic diastolic CHF (congestive heart failure) (HCC)   Intertriginous candidiasis   Depression   Pain and swelling of left lower leg   Obesity, Class III, BMI 40-49.9 (morbid obesity) (HCC)   UTI (urinary tract infection)   HPI: Haley Deleon is a 68 y.o. female with a history of multiple medical problems including alcohol use disorder CHF coronary artery disease depression anxiety morbid obesity ulcerative colitis arrhythmias varicose veins carotid artery stenosis who was admitted November 25 with left leg pain and swelling and confusion.  01 was felt she had alcohol withdrawal and a UTI.  She was treated with CIWA protocol.  She then had a fever and felt she had a pneumonia on chest x-ray air.  She was treated with ceftriaxone  and azithromycin  for 8 days.  She continued a fever so vancomycin  was added as she was having some groin erythema.  She was also diagnosed with parainfluenza virus.  She had a urine culture with Proteus and E. coli sensitive to the ceftriaxone . Initially she had defervesced but then December 8 developed fevers to 101.3 spiking up to 103.  Her white count was 8.7 and is now 9.7.  She had repeat blood cultures done.  LFTs were slightly elevated ALT AST.  Procalcitonin was normal. She had a CT of head showed no acute changes.  Chest x-ray showed no acute changes.  She had negative Dopplers done November 25. Remains quite obtunded. Not eating or drinking. RN reports no diarrhea, no significant wounds   Past Medical History:  Diagnosis Date   Alcohol use disorder    Anemia    Atypical chest pain    Carotid arterial disease    a. 11/2017 Carotid U/S: RICA <50, LICA nl.   Claustrophobia    Coronary artery  disease    Diastolic dysfunction    a. 11/2017 Echo: EF 60-65%, no rwma, Gr1 DD. Nl RV fxn.   Dysrhythmia    Family history of adverse reaction to anesthesia    a.) early emergence in 3rd degree relative (1st cousin)   Heart murmur    Liver cyst    Pneumonia    Ulcerative colitis (HCC)    Venous insufficiency    Past Surgical History:  Procedure Laterality Date   BREAST BIOPSY Right 11/02/2011   ultrasound guided biopsy/clip-benign   CHOLECYSTECTOMY N/A 07/29/2017   Procedure: LAPAROSCOPIC CHOLECYSTECTOMY;  Surgeon: Jordis Laneta FALCON, MD;  Location: [facility];  Service: General;  Laterality: N/A;   COLON SURGERY  2014, 2015   DUE TO ULERATIVE COLITIS WITH COLOSTOMY   COLOSTOMY TAKEDOWN     FOOT SURGERY Bilateral    BONE SPURS    HYSTEROSCOPY WITH D & C N/A 09/11/2022   Procedure: FRACTIONAL DILATATION AND CURETTAGE /HYSTEROSCOPY;  Surgeon: Schermerhorn, Debby PARAS, MD;  Location: [facility];  Service: Gynecology;  Laterality: N/A;   MANDIBLE FRACTURE SURGERY  1991   MOUTH SURGERY     VENTRAL HERNIA REPAIR N/A 07/29/2017   Procedure: LAPAROSCOPIC VENTRAL HERNIA;  Surgeon: Jordis Laneta FALCON, MD;  Location: [facility];  Service: General;  Laterality: N/A;   Social History   Tobacco Use   Smoking status: Former    Current packs/day: 0.00    Average  packs/day: 1 pack/day for 20.0 years (20.0 ttl pk-yrs)    Types: Cigarettes    Start date: 07/28/1985    Quit date: 07/28/2005    Years since quitting: 19.1   Smokeless tobacco: Never  Vaping Use   Vaping status: Never Used  Substance Use Topics   Alcohol use: Yes    Comment: WINE   Drug use: No   Family History  Problem Relation Age of Onset   Heart attack Mother 22   Hyperlipidemia Brother    Breast cancer Neg Hx     Allergies:  Allergies  Allergen Reactions   Caffeine Anaphylaxis and Other (See Comments)   Chocolate Anaphylaxis   Chocolate Flavoring Agent (Non-Screening) Anaphylaxis   Cocoa Anaphylaxis   Meloxicam Other (See  Comments)    migraine and bleeding  Severe headache  meloxicam    Current antibiotics: Antibiotics Given (last 72 hours)     None       MEDICATIONS:  enoxaparin  (LOVENOX ) injection  60 mg Subcutaneous Q24H   escitalopram   10 mg Oral Daily   feeding supplement  237 mL Oral BID BM   folic acid   1 mg Oral Daily   multivitamin with minerals  1 tablet Oral Daily   nystatin    Topical BID   thiamine   100 mg Oral Daily   Or   thiamine   100 mg Intravenous Daily    Review of Systems - 11 systems reviewed and negative per HPI   OBJECTIVE: Temp:  [98.2 F (36.8 C)-103 F (39.4 C)] 101.4 F (38.6 C) (12/09 1516) Pulse Rate:  [93-98] 93 (12/09 1516) Resp:  [14-22] 21 (12/09 1516) BP: (122-148)/(63-85) 148/85 (12/09 1516) SpO2:  [92 %-94 %] 92 % (12/09 1516) Physical Exam  Constitutional:  obtunded, moans to sternal rub HENT: [place]/AT, PERRLA, no scleral icterus Mouth/Throat: Oropharynx is dry Cardiovascular: Normal rate, regular rhythm and normal heart sounds.  Pulmonary/Chest: Effort normal and breath sounds normal. No respiratory distress.  has no wheezes. Poor air movement  Neck = diff to assess neck movemetn Abdominal: Soft. Bowel sounds are normal.  exhibits no distension.  Lymphadenopathy: no cervical adenopathy. No axillary adenopathy Neurological:  obtunded  Skin: some abrasions and bruising.     LABS: Results for orders placed or performed during the hospital encounter of 08/15/24 (from the past 48 hours)  Glucose, capillary     Status: Abnormal   Collection Time: 08/27/24 11:07 PM  Result Value Ref Range   Glucose-Capillary 112 (H) 70 - 99 mg/dL    Comment: Glucose reference range applies only to samples taken after fasting for at least 8 hours.  Culture, blood (Routine X 2) w Reflex to ID Panel     Status: None (Preliminary result)   Collection Time: 08/28/24  1:40 PM   Specimen: BLOOD  Result Value Ref Range   Specimen Description BLOOD BLOOD RIGHT HAND     Special Requests      BOTTLES DRAWN AEROBIC AND ANAEROBIC Blood Culture adequate volume   Culture      NO GROWTH < 24 HOURS Performed at [facility], [address], [place], [place] [address]    Report Status PENDING   Culture, blood (Routine X 2) w Reflex to ID Panel     Status: None (Preliminary result)   Collection Time: 08/28/24  1:40 PM   Specimen: BLOOD  Result Value Ref Range   Specimen Description BLOOD BLOOD LEFT HAND    Special Requests  BOTTLES DRAWN AEROBIC AND ANAEROBIC Blood Culture adequate volume   Culture      NO GROWTH < 24 HOURS Performed at [facility], [address] [place], [place] [address]    Report Status PENDING   Procalcitonin     Status: None   Collection Time: 08/28/24  1:40 PM  Result Value Ref Range   Procalcitonin 0.10 ng/mL    Comment: (NOTE)   Sepsis PCT Algorithm          Lower Respiratory Tract Infection                                         PCT Algorithm -----------------------------------------------------------------  <0.5 ng/mL                    <0.10 ng/mL  Associated with low           Antibiotic therapy strongly   risk for progression          discouraged. Indicates absence   to severe sepsis              of bacteria infection  and/or septic shock             --------------------------------------------------------------  0.5-2.0 ng/mL                 0.10-0.25 ng/mL  Recommended to retest         Antibiotic therapy discouraged.  PCT within 6-24 hours         Bacterial infection unlikely  ------------------------------------------------------------  >2 ng/mL                      0.26-0.50 ng/mL  Associated with high risk     Antibiotic therapy encouraged.  for progression to severe     Bacterial infection possible  sepsis/and or septic shock    ------------------------------                                 >0.50 ng/mL                                Antibiotic therapy strongly                                  encouraged.                                Suggestive of presence of                                 bacterial infection.                                 -------------------------------------------------------------------  < or = 0.50 ng/mL OR          < or = 0.25 OR 80% decrease in PCT  80% decrease in PCT           Antibiotic therapy   Antibiotic therapy may  may be discontinued  be discontinued                                 Performed at [facility], [address] [place], [place] [address]   Ammonia     Status: None   Collection Time: 08/29/24  4:34 AM  Result Value Ref Range   Ammonia 25 9 - 35 umol/L    Comment: Performed at [facility], [address] [place], [place] [address]  Comprehensive metabolic panel     Status: Abnormal   Collection Time: 08/29/24  4:34 AM  Result Value Ref Range   Sodium 146 (H) 135 - 145 mmol/L   Potassium 4.0 3.5 - 5.1 mmol/L   Chloride 108 98 - 111 mmol/L   CO2 22 22 - 32 mmol/L   Glucose, Bld 118 (H) 70 - 99 mg/dL    Comment: Glucose reference range applies only to samples taken after fasting for at least 8 hours.   BUN 16 8 - 23 mg/dL   Creatinine, Ser 9.22 0.44 - 1.00 mg/dL   Calcium 9.3 8.9 - 89.6 mg/dL   Total Protein 7.1 6.5 - 8.1 g/dL   Albumin 3.5 3.5 - 5.0 g/dL   AST 71 (H) 15 - 41 U/L   ALT 71 (H) 0 - 44 U/L   Alkaline Phosphatase 109 38 - 126 U/L   Total Bilirubin 0.7 0.0 - 1.2 mg/dL   GFR, Estimated >39 >39 mL/min    Comment: (NOTE) Calculated using the CKD-EPI Creatinine Equation (2021)    Anion gap 16 (H) 5 - 15    Comment: Performed at [facility], [address] [place], [place] [address]  CBC     Status: None   Collection Time: 08/29/24  4:34 AM  Result Value Ref Range   WBC 9.7 4.0 - 10.5 K/uL   RBC 4.94 3.87 - 5.11 MIL/uL   Hemoglobin 14.7 12.0 - 15.0 g/dL   HCT 55.0 63.9 - 53.9 %   MCV 90.9 80.0 - 100.0 fL   MCH 29.8 26.0 - 34.0 pg   MCHC 32.7 30.0 - 36.0  g/dL   RDW 86.8 88.4 - 84.4 %   Platelets 353 150 - 400 K/uL   nRBC 0.0 0.0 - 0.2 %    Comment: Performed at [facility], [address] [place], [place] [address]   No components found for: ESR, C REACTIVE PROTEIN MICRO: Recent Results (from the past 720 hours)  Urine Culture (for pregnant, neutropenic or urologic patients or patients with an indwelling urinary catheter)     Status: Abnormal   Collection Time: 08/16/24  5:07 AM   Specimen: Urine, Clean Catch  Result Value Ref Range Status   Specimen Description   Final    URINE, CLEAN CATCH Performed at [facility], [address] [place], [place] [address]    Special Requests   Final    NONE Performed at [facility], [address] [place], [place] [address]    Culture (A)  Final    30,000 COLONIES/mL PROTEUS MIRABILIS >=100,000 COLONIES/mL ESCHERICHIA COLI    Report Status 08/20/2024 FINAL  Final   Organism ID, Bacteria PROTEUS MIRABILIS (A)  Final   Organism ID, Bacteria ESCHERICHIA COLI (A)  Final      Susceptibility   Escherichia coli - MIC*    AMPICILLIN >=32 RESISTANT Resistant  CEFAZOLIN  (URINE) Value in next row Sensitive      2 SENSITIVEThis is a modified FDA-approved test that has been validated and its performance characteristics determined by the reporting laboratory.  This laboratory is certified under the Clinical Laboratory Improvement Amendments CLIA as qualified to perform high complexity clinical laboratory testing.    CEFEPIME Value in next row Sensitive      2 SENSITIVEThis is a modified FDA-approved test that has been validated and its performance characteristics determined by the reporting laboratory.  This laboratory is certified under the Clinical Laboratory Improvement Amendments CLIA as qualified to perform high complexity clinical laboratory testing.    ERTAPENEM Value in next row Sensitive      2 SENSITIVEThis is a modified FDA-approved test that has been  validated and its performance characteristics determined by the reporting laboratory.  This laboratory is certified under the Clinical Laboratory Improvement Amendments CLIA as qualified to perform high complexity clinical laboratory testing.    CEFTRIAXONE  Value in next row Sensitive      2 SENSITIVEThis is a modified FDA-approved test that has been validated and its performance characteristics determined by the reporting laboratory.  This laboratory is certified under the Clinical Laboratory Improvement Amendments CLIA as qualified to perform high complexity clinical laboratory testing.    CIPROFLOXACIN Value in next row Sensitive      2 SENSITIVEThis is a modified FDA-approved test that has been validated and its performance characteristics determined by the reporting laboratory.  This laboratory is certified under the Clinical Laboratory Improvement Amendments CLIA as qualified to perform high complexity clinical laboratory testing.    GENTAMICIN Value in next row Sensitive      2 SENSITIVEThis is a modified FDA-approved test that has been validated and its performance characteristics determined by the reporting laboratory.  This laboratory is certified under the Clinical Laboratory Improvement Amendments CLIA as qualified to perform high complexity clinical laboratory testing.    NITROFURANTOIN Value in next row Sensitive      2 SENSITIVEThis is a modified FDA-approved test that has been validated and its performance characteristics determined by the reporting laboratory.  This laboratory is certified under the Clinical Laboratory Improvement Amendments CLIA as qualified to perform high complexity clinical laboratory testing.    TRIMETH/SULFA Value in next row Sensitive      2 SENSITIVEThis is a modified FDA-approved test that has been validated and its performance characteristics determined by the reporting laboratory.  This laboratory is certified under the Clinical Laboratory Improvement Amendments  CLIA as qualified to perform high complexity clinical laboratory testing.    AMPICILLIN/SULBACTAM Value in next row Sensitive      2 SENSITIVEThis is a modified FDA-approved test that has been validated and its performance characteristics determined by the reporting laboratory.  This laboratory is certified under the Clinical Laboratory Improvement Amendments CLIA as qualified to perform high complexity clinical laboratory testing.    PIP/TAZO Value in next row Sensitive      <=4 SENSITIVEThis is a modified FDA-approved test that has been validated and its performance characteristics determined by the reporting laboratory.  This laboratory is certified under the Clinical Laboratory Improvement Amendments CLIA as qualified to perform high complexity clinical laboratory testing.    MEROPENEM Value in next row Sensitive      <=4 SENSITIVEThis is a modified FDA-approved test that has been validated and its performance characteristics determined by the reporting laboratory.  This laboratory is certified under the Clinical Laboratory Improvement Amendments CLIA as qualified to perform  high complexity clinical laboratory testing.    * >=100,000 COLONIES/mL ESCHERICHIA COLI   Proteus mirabilis - MIC*    AMPICILLIN Value in next row Sensitive      <=4 SENSITIVEThis is a modified FDA-approved test that has been validated and its performance characteristics determined by the reporting laboratory.  This laboratory is certified under the Clinical Laboratory Improvement Amendments CLIA as qualified to perform high complexity clinical laboratory testing.    CEFAZOLIN  (URINE) Value in next row Sensitive      8 SENSITIVEThis is a modified FDA-approved test that has been validated and its performance characteristics determined by the reporting laboratory.  This laboratory is certified under the Clinical Laboratory Improvement Amendments CLIA as qualified to perform high complexity clinical laboratory testing.    CEFEPIME  Value in next row Sensitive      8 SENSITIVEThis is a modified FDA-approved test that has been validated and its performance characteristics determined by the reporting laboratory.  This laboratory is certified under the Clinical Laboratory Improvement Amendments CLIA as qualified to perform high complexity clinical laboratory testing.    ERTAPENEM Value in next row Sensitive      8 SENSITIVEThis is a modified FDA-approved test that has been validated and its performance characteristics determined by the reporting laboratory.  This laboratory is certified under the Clinical Laboratory Improvement Amendments CLIA as qualified to perform high complexity clinical laboratory testing.    CEFTRIAXONE  Value in next row Sensitive      8 SENSITIVEThis is a modified FDA-approved test that has been validated and its performance characteristics determined by the reporting laboratory.  This laboratory is certified under the Clinical Laboratory Improvement Amendments CLIA as qualified to perform high complexity clinical laboratory testing.    CIPROFLOXACIN Value in next row Sensitive      8 SENSITIVEThis is a modified FDA-approved test that has been validated and its performance characteristics determined by the reporting laboratory.  This laboratory is certified under the Clinical Laboratory Improvement Amendments CLIA as qualified to perform high complexity clinical laboratory testing.    GENTAMICIN Value in next row Sensitive      8 SENSITIVEThis is a modified FDA-approved test that has been validated and its performance characteristics determined by the reporting laboratory.  This laboratory is certified under the Clinical Laboratory Improvement Amendments CLIA as qualified to perform high complexity clinical laboratory testing.    NITROFURANTOIN Value in next row Resistant      8 SENSITIVEThis is a modified FDA-approved test that has been validated and its performance characteristics determined by the reporting  laboratory.  This laboratory is certified under the Clinical Laboratory Improvement Amendments CLIA as qualified to perform high complexity clinical laboratory testing.    TRIMETH/SULFA Value in next row Sensitive      8 SENSITIVEThis is a modified FDA-approved test that has been validated and its performance characteristics determined by the reporting laboratory.  This laboratory is certified under the Clinical Laboratory Improvement Amendments CLIA as qualified to perform high complexity clinical laboratory testing.    AMPICILLIN/SULBACTAM Value in next row Sensitive      8 SENSITIVEThis is a modified FDA-approved test that has been validated and its performance characteristics determined by the reporting laboratory.  This laboratory is certified under the Clinical Laboratory Improvement Amendments CLIA as qualified to perform high complexity clinical laboratory testing.    PIP/TAZO Value in next row Sensitive      <=4 SENSITIVEThis is a modified FDA-approved test that has been validated and its performance  characteristics determined by the reporting laboratory.  This laboratory is certified under the Clinical Laboratory Improvement Amendments CLIA as qualified to perform high complexity clinical laboratory testing.    MEROPENEM Value in next row Sensitive      <=4 SENSITIVEThis is a modified FDA-approved test that has been validated and its performance characteristics determined by the reporting laboratory.  This laboratory is certified under the Clinical Laboratory Improvement Amendments CLIA as qualified to perform high complexity clinical laboratory testing.    * 30,000 COLONIES/mL PROTEUS MIRABILIS  Resp panel by RT-PCR (RSV, Flu A&B, Covid) Anterior Nasal Swab     Status: None   Collection Time: 08/20/24  5:34 PM   Specimen: Anterior Nasal Swab  Result Value Ref Range Status   SARS Coronavirus 2 by RT PCR NEGATIVE NEGATIVE Final    Comment: (NOTE) SARS-CoV-2 target nucleic acids are NOT  DETECTED.  The SARS-CoV-2 RNA is generally detectable in upper respiratory specimens during the acute phase of infection. The lowest concentration of SARS-CoV-2 viral copies this assay can detect is 138 copies/mL. A negative result does not preclude SARS-Cov-2 infection and should not be used as the sole basis for treatment or other patient management decisions. A negative result may occur with  improper specimen collection/handling, submission of specimen other than nasopharyngeal swab, presence of viral mutation(s) within the areas targeted by this assay, and inadequate number of viral copies(<138 copies/mL). A negative result must be combined with clinical observations, patient history, and epidemiological information. The expected result is Negative.  Fact Sheet for Patients:  bloggercourse.com  Fact Sheet for Healthcare Providers:  seriousbroker.it  This test is no t yet approved or cleared by the [place]  FDA and  has been authorized for detection and/or diagnosis of SARS-CoV-2 by FDA under an Emergency Use Authorization (EUA). This EUA will remain  in effect (meaning this test can be used) for the duration of the COVID-19 declaration under Section 564(b)(1) of the Act, 21 U.S.C.section 360bbb-3(b)(1), unless the authorization is terminated  or revoked sooner.       Influenza A by PCR NEGATIVE NEGATIVE Final   Influenza B by PCR NEGATIVE NEGATIVE Final    Comment: (NOTE) The Xpert Xpress SARS-CoV-2/FLU/RSV plus assay is intended as an aid in the diagnosis of influenza from Nasopharyngeal swab specimens and should not be used as a sole basis for treatment. Nasal washings and aspirates are unacceptable for Xpert Xpress SARS-CoV-2/FLU/RSV testing.  Fact Sheet for Patients: bloggercourse.com  Fact Sheet for Healthcare Providers: seriousbroker.it  This test is not yet  approved or cleared by the [place]  FDA and has been authorized for detection and/or diagnosis of SARS-CoV-2 by FDA under an Emergency Use Authorization (EUA). This EUA will remain in effect (meaning this test can be used) for the duration of the COVID-19 declaration under Section 564(b)(1) of the Act, 21 U.S.C. section 360bbb-3(b)(1), unless the authorization is terminated or revoked.     Resp Syncytial Virus by PCR NEGATIVE NEGATIVE Final    Comment: (NOTE) Fact Sheet for Patients: bloggercourse.com  Fact Sheet for Healthcare Providers: seriousbroker.it  This test is not yet approved or cleared by the [place]  FDA and has been authorized for detection and/or diagnosis of SARS-CoV-2 by FDA under an Emergency Use Authorization (EUA). This EUA will remain in effect (meaning this test can be used) for the duration of the COVID-19 declaration under Section 564(b)(1) of the Act, 21 U.S.C. section 360bbb-3(b)(1), unless the authorization is terminated or revoked.  Performed at [facility], [address] [place], [place] [address]   Respiratory (~20 pathogens) panel by PCR     Status: Abnormal   Collection Time: 08/21/24  8:23 AM   Specimen: Nasopharyngeal Swab; Respiratory  Result Value Ref Range Status   Adenovirus NOT DETECTED NOT DETECTED Final   Coronavirus 229E NOT DETECTED NOT DETECTED Final    Comment: (NOTE) The Coronavirus on the Respiratory Panel, DOES NOT test for the novel  Coronavirus (2019 nCoV)    Coronavirus HKU1 NOT DETECTED NOT DETECTED Final   Coronavirus NL63 NOT DETECTED NOT DETECTED Final   Coronavirus OC43 NOT DETECTED NOT DETECTED Final   Metapneumovirus NOT DETECTED NOT DETECTED Final   Rhinovirus / Enterovirus NOT DETECTED NOT DETECTED Final   Influenza A NOT DETECTED NOT DETECTED Final   Influenza B NOT DETECTED NOT DETECTED Final   Parainfluenza Virus 1 DETECTED (A) NOT DETECTED  Final   Parainfluenza Virus 2 NOT DETECTED NOT DETECTED Final   Parainfluenza Virus 3 NOT DETECTED NOT DETECTED Final   Parainfluenza Virus 4 NOT DETECTED NOT DETECTED Final   Respiratory Syncytial Virus NOT DETECTED NOT DETECTED Final   Bordetella pertussis NOT DETECTED NOT DETECTED Final   Bordetella Parapertussis NOT DETECTED NOT DETECTED Final   Chlamydophila pneumoniae NOT DETECTED NOT DETECTED Final   Mycoplasma pneumoniae NOT DETECTED NOT DETECTED Final    Comment: Performed at [facility], [address] [place], [place] [address]  MRSA Next Gen by PCR, Nasal     Status: Abnormal   Collection Time: 08/21/24  8:23 AM   Specimen: Nasal Mucosa; Nasal Swab  Result Value Ref Range Status   MRSA by PCR Next Gen DETECTED (A) NOT DETECTED Final    Comment: RESULT CALLED TO, READ BACK BY AND VERIFIED WITH: ALLIE T. RN [id number] 08/21/24 HNM (NOTE) The GeneXpert MRSA Assay (FDA approved for NASAL specimens only), is one component of a comprehensive MRSA colonization surveillance program. It is not intended to diagnose MRSA infection nor to guide or monitor treatment for MRSA infections. Test performance is not FDA approved in patients less than 18 years old. Performed at [facility], [address] [place], [place] [address]   Culture, blood (Routine X 2) w Reflex to ID Panel     Status: None (Preliminary result)   Collection Time: 08/28/24  1:40 PM   Specimen: BLOOD  Result Value Ref Range Status   Specimen Description BLOOD BLOOD RIGHT HAND  Final   Special Requests   Final    BOTTLES DRAWN AEROBIC AND ANAEROBIC Blood Culture adequate volume   Culture   Final    NO GROWTH < 24 HOURS Performed at [facility], [address], [place], [place] [address]    Report Status PENDING  Incomplete  Culture, blood (Routine X 2) w Reflex to ID Panel     Status: None (Preliminary result)   Collection Time: 08/28/24  1:40 PM   Specimen: BLOOD  Result Value Ref Range  Status   Specimen Description BLOOD BLOOD LEFT HAND  Final   Special Requests   Final    BOTTLES DRAWN AEROBIC AND ANAEROBIC Blood Culture adequate volume   Culture   Final    NO GROWTH < 24 HOURS Performed at [facility], [address] [place], [place] [address]    Report Status PENDING  Incomplete    IMAGING: DG Chest Port 1 View Result Date: 08/29/2024 EXAM: 1 VIEW XRAY OF THE CHEST 08/29/2024 08:44:53  AM COMPARISON: None available. CLINICAL HISTORY: Dyspnea. FINDINGS: LUNGS AND PLEURA: Mildly low lung volumes. No focal pulmonary opacity. No pleural effusion. No pneumothorax. HEART AND MEDIASTINUM: Unchanged cardiomegaly. BONES AND SOFT TISSUES: No acute osseous abnormality. IMPRESSION: 1. No acute cardiopulmonary process identified. 2. Cardiomegaly, unchanged. Electronically signed by: Manford Cummins MD 08/29/2024 01:23 PM EST RP Workstation: [id number]   CT HEAD WO CONTRAST ( ) Result Date: 08/29/2024 EXAM: CT HEAD WITHOUT CONTRAST 08/29/2024 08:34:51 AM TECHNIQUE: CT of the head was performed without the administration of intravenous contrast. Automated exposure control, iterative reconstruction, and/or weight based adjustment of the mA/kV was utilized to reduce the radiation dose to as low as reasonably achievable. COMPARISON: CT head 08/15/2024. CLINICAL HISTORY: Mental status change, unknown cause. FINDINGS: BRAIN AND VENTRICLES: No acute hemorrhage. No evidence of acute infarct. No hydrocephalus. No extra-axial collection. No mass effect or midline shift. Overall similar mild scattered white matter hypodensities which are nonspecific but most commonly represent chronic microvascular ischemic changes. There is diffusely hyperattenuating appearance of the intracranial vasculature in particular, the left MCA, which may relate to elevated hematocrit. ORBITS: No acute abnormality. SINUSES: No acute abnormality. SOFT TISSUES AND SKULL: No acute soft tissue abnormality. No skull fracture.  IMPRESSION: 1. No acute intracranial hemorrhage or mass effect. 2. Hyperattenuating intracranial vasculature, particularly the left MCA, which may reflect elevated hematocrit. If there is concern for recent ischemic infarction, recommend CTA Head and Neck and/or MRI Brain. Electronically signed by: prentice spade 08/29/2024 09:02 AM EST RP Workstation: [id number]   DG Chest Port 1 View Result Date: 08/22/2024 CLINICAL DATA:  Cough and fever. EXAM: PORTABLE CHEST 1 VIEW COMPARISON:  08/20/2024 FINDINGS: Improved inspiration. Normal-sized heart. Clear lungs with normal vascularity. Mild positional scoliosis. IMPRESSION: No acute abnormality. Electronically Signed   By: Elspeth Bathe M.D.   On: 08/22/2024 15:11   DG Chest Port 1 View Result Date: 08/20/2024 CLINICAL DATA:  Cough and fever. EXAM: PORTABLE CHEST 1 VIEW COMPARISON:  04/09/2022 FINDINGS: Lungs are hypoinflated with minimal prominence of the central pulmonary vessels which may be due to mild vascular congestion. Possible patchy density over the right midlung which could be due to atelectasis or early infection. No effusion or pneumothorax. Cardiac silhouette is normal. Old left lateral rib fractures. IMPRESSION: 1. Hypoinflation with possible mild vascular congestion. 2. Possible patchy density over the right midlung which could be due to atelectasis or early infection. Electronically Signed   By: Toribio Agreste M.D.   On: 08/20/2024 11:45   DG Tibia/Fibula Left Result Date: 08/16/2024 EXAM: VIEW(S) XRAY OF THE LEFT TIBIA AND FIBULA 08/16/2024 02:30:00 AM COMPARISON: 10/25/2020 CLINICAL HISTORY: Pain in left lower leg FINDINGS: BONES AND JOINTS: Mild narrowing of the medial joint space is seen. Mild patellofemoral degenerative changes are noted as well. No acute fracture or dislocation is seen. SOFT TISSUES: Soft tissue swelling was noted. IMPRESSION: 1. Degenerative changes without acute abnormality. Electronically signed by: Oneil Devonshire MD  08/16/2024 02:34 AM EST RP Workstation: [id number]   CT Head Wo Contrast Result Date: 08/15/2024 EXAM: CT HEAD WITHOUT CONTRAST 08/15/2024 11:21:07 PM TECHNIQUE: CT of the head was performed without the administration of intravenous contrast. Automated exposure control, iterative reconstruction, and/or weight based adjustment of the mA/kV was utilized to reduce the radiation dose to as low as reasonably achievable. COMPARISON: None available. CLINICAL HISTORY: Mental status change, unknown cause FINDINGS: BRAIN AND VENTRICLES: No acute hemorrhage. No evidence of acute infarct. No hydrocephalus. No extra-axial collection. No mass effect or midline shift. ORBITS:  No acute abnormality. SINUSES: No acute abnormality. SOFT TISSUES AND SKULL: No acute soft tissue abnormality. No skull fracture. IMPRESSION: 1. No acute intracranial abnormality. Electronically signed by: Franky Crease MD 08/15/2024 11:23 PM EST RP Workstation: [id number]   US  Venous Img Lower  Left (DVT Study) Result Date: 08/15/2024 EXAM: ULTRASOUND DUPLEX OF THE LEFT LOWER EXTREMITY VEINS TECHNIQUE: Duplex ultrasound using B-mode/gray scaled imaging and Doppler spectral analysis and color flow was obtained of the deep venous structures of the left lower extremity. COMPARISON: None available. CLINICAL HISTORY: swelling FINDINGS: The common femoral vein, femoral vein, popliteal vein, and posterior tibial vein of the left lower extremity demonstrate normal compressibility with normal color flow and spectral analysis. IMPRESSION: 1. No evidence of DVT in the left lower extremity. Electronically signed by: Franky Crease MD 08/15/2024 09:46 PM EST RP Workstation: [id number]    Assessment:   Haley Deleon is a 68 y.o. female with AMS and fevers recurring after initial treatment for UTI and CAP. + parainfluenza on 12/1 Despite fevers wbc and procalc nml. CXR no change CT with chronic changes in head. MRI Pending BP HR stable  Multiple possible  etiologies for the fevers including UTI, aspiration, bacteremia, DVT, skin soft tissue infection  Recommendations Check UA CHeck MRI Head Consider neurology consult If becomes unstable would start vanco and cefepime. Thank you very much for allowing me to participate in the care of this patient. Please call with questions.   Alm SQUIBB. Epifanio, MD

## 2024-08-29 NOTE — Progress Notes (Signed)
 PROGRESS NOTE    Haley Deleon  FMW:[id number] DOB: 1955-10-16 DOA: 08/15/2024 PCP: Ricard Tawni KIDD, MD    Assessment & Plan:   Principal Problem:   Acute metabolic encephalopathy Active Problems:   UTI (urinary tract infection)   Alcohol  withdrawal (HCC)   Pain and swelling of left lower leg   Chronic diastolic CHF (congestive heart failure) (HCC)   Ulcerative colitis (HCC)   Intertriginous candidiasis   Depression   Obesity, Class III, BMI 40-49.9 (morbid obesity) (HCC)  Assessment and Plan:  Acute metabolic encephalopathy: likely multifactorial, ethanol w/drawal, UTI and parainfluenza virus. Re-orient prn. Mental status waxes and wanes. CT head shows No acute intracranial hemorrhage or mass effect. Hyperattenuating intracranial vasculature, particularly the left MCA, which may reflect elevated hematocrit. MRI brain ordered. Continue w/ supportive care   UTI: urine cx grew proteus and e. coli. Completed abx course  Parainfluenza virus:  continue w/ supportive care   Fever: possibly secondary to parainfluenza virus. Procal 0.10. Blood cxs NGTD. ID consulted   Hypernatremia: started on D5W. Will continue to monitor   Groin candida: continue on nystatin    Unlikely pneumonia: repeat CXR on 08/22/24 shows no acute abnormalities. Previous XR findings likely secondary to atelectasis. Repeat CXR requested per pt's family shows no acute cardiopulmonary abnormalities     Chronic venous stasis: w/ pain and swelling of LLE. US  of LLE was neg for DVT    Chronic diastolic CHF: echo on 02/24/2021 showed EF 60 to 65%.  Appears decompensated. Monitor I/Os   Depression: severity unknown. Continue on home dose of lexapro    Obesity: BMI 39.3. Complicates overall care & prognosis         DVT prophylaxis: Lovenox  Code Status: full  Family Communication: discussed pt's care w/ pt's granddaughter, [name], and answered her questions Disposition Plan: likely d/c to SNF  Level of  care: Med-Surg  Status is: Inpatient Remains inpatient appropriate because: AMS & fevers still     Consultants:  ID  Procedures:   Antimicrobials:   Subjective: Pt is lethargic.   Objective: Vitals:   08/28/24 1843 08/28/24 2140 08/28/24 2344 08/29/24 0523  BP:  131/71 122/65 (!) 145/63  Pulse:  96 98 94  Resp:  18 (!) 22 18  Temp: (!) 101 F (38.3 C) (!) 103 F (39.4 C) (!) 100.4 F (38 C) (!) 100.7 F (38.2 C)  TempSrc: Axillary     SpO2:  92% 94% 92%  Weight:      Height:       No intake or output data in the 24 hours ending 08/29/24 0651  Filed Weights   08/26/24 0500 08/27/24 0344 08/28/24 0500  Weight: 118 kg 114 kg 117.3 kg    Examination:  General exam: appears lethargic Respiratory system: decreased breath sounds b/l  Cardiovascular system: S1 & S2+. No rubs or clicks Gastrointestinal system: abd is soft, NT, obese & hypoactive bowel sounds Central nervous system: lethargic Psychiatry: Judgement and insight appears poor.    Data Reviewed: I have personally reviewed following labs and imaging studies  CBC: Recent Labs  Lab 08/24/24 0438 08/25/24 0518 08/26/24 0832 08/27/24 0530 08/29/24 0434  WBC 7.2 7.3 7.5 8.7 9.7  NEUTROABS 4.1 4.3 5.0 5.9  --   HGB 14.1 14.3 14.2 15.0 14.7  HCT 42.5 43.0 42.7 46.9* 44.9  MCV 90.4 90.3 89.7 92.5 90.9  PLT 190 210 229 289 353   Basic Metabolic Panel: Recent Labs  Lab 08/24/24 0438 08/25/24 0518 08/26/24 9167  08/27/24 0530 08/29/24 0434  NA 135 138 139 139 146*  K 4.0 3.8 3.7 4.0 4.0  CL 100 105 106 104 108  CO2 26 23 23 22 22   GLUCOSE 94 103* 109* 122* 118*  BUN 23 20 16 15 16   CREATININE 0.74 0.60 0.62 0.68 0.77  CALCIUM 8.7* 8.7* 8.9 9.4 9.3   GFR: Estimated Creatinine Clearance: 90.4 mL/min (by C-G formula based on SCr of 0.77 mg/dL). Liver Function Tests: Recent Labs  Lab 08/29/24 0434  AST 71*  ALT 71*  ALKPHOS 109  BILITOT 0.7  PROT 7.1  ALBUMIN 3.5   No results for  input(s): LIPASE, AMYLASE in the last 168 hours. Recent Labs  Lab 08/29/24 0434  AMMONIA 25   Coagulation Profile: No results for input(s): INR, PROTIME in the last 168 hours. Cardiac Enzymes: No results for input(s): CKTOTAL, CKMB, CKMBINDEX, TROPONINI in the last 168 hours. BNP (last 3 results) Recent Labs    08/15/24 2238  PROBNP 483.0*   HbA1C: No results for input(s): HGBA1C in the last 72 hours. CBG: Recent Labs  Lab 08/27/24 2307  GLUCAP 112*   Lipid Profile: No results for input(s): CHOL, HDL, LDLCALC, TRIG, CHOLHDL, LDLDIRECT in the last 72 hours. Thyroid Function Tests: No results for input(s): TSH, T4TOTAL, FREET4, T3FREE, THYROIDAB in the last 72 hours. Anemia Panel: No results for input(s): VITAMINB12, FOLATE, FERRITIN, TIBC, IRON, RETICCTPCT in the last 72 hours. Sepsis Labs: Recent Labs  Lab 08/28/24 1340  PROCALCITON 0.10    Recent Results (from the past 240 hours)  Resp panel by RT-PCR (RSV, Flu A&B, Covid) Anterior Nasal Swab     Status: None   Collection Time: 08/20/24  5:34 PM   Specimen: Anterior Nasal Swab  Result Value Ref Range Status   SARS Coronavirus 2 by RT PCR NEGATIVE NEGATIVE Final    Comment: (NOTE) SARS-CoV-2 target nucleic acids are NOT DETECTED.  The SARS-CoV-2 RNA is generally detectable in upper respiratory specimens during the acute phase of infection. The lowest concentration of SARS-CoV-2 viral copies this assay can detect is 138 copies/mL. A negative result does not preclude SARS-Cov-2 infection and should not be used as the sole basis for treatment or other patient management decisions. A negative result may occur with  improper specimen collection/handling, submission of specimen other than nasopharyngeal swab, presence of viral mutation(s) within the areas targeted by this assay, and inadequate number of viral copies(<138 copies/mL). A negative result must be combined  with clinical observations, patient history, and epidemiological information. The expected result is Negative.  Fact Sheet for Patients:  bloggercourse.com  Fact Sheet for Healthcare Providers:  seriousbroker.it  This test is no t yet approved or cleared by the [place]  FDA and  has been authorized for detection and/or diagnosis of SARS-CoV-2 by FDA under an Emergency Use Authorization (EUA). This EUA will remain  in effect (meaning this test can be used) for the duration of the COVID-19 declaration under Section 564(b)(1) of the Act, 21 U.S.C.section 360bbb-3(b)(1), unless the authorization is terminated  or revoked sooner.       Influenza A by PCR NEGATIVE NEGATIVE Final   Influenza B by PCR NEGATIVE NEGATIVE Final    Comment: (NOTE) The Xpert Xpress SARS-CoV-2/FLU/RSV plus assay is intended as an aid in the diagnosis of influenza from Nasopharyngeal swab specimens and should not be used as a sole basis for treatment. Nasal washings and aspirates are unacceptable for Xpert Xpress SARS-CoV-2/FLU/RSV testing.  Fact Sheet for Patients:  bloggercourse.com  Fact Sheet for Healthcare Providers: seriousbroker.it  This test is not yet approved or cleared by the [place]  FDA and has been authorized for detection and/or diagnosis of SARS-CoV-2 by FDA under an Emergency Use Authorization (EUA). This EUA will remain in effect (meaning this test can be used) for the duration of the COVID-19 declaration under Section 564(b)(1) of the Act, 21 U.S.C. section 360bbb-3(b)(1), unless the authorization is terminated or revoked.     Resp Syncytial Virus by PCR NEGATIVE NEGATIVE Final    Comment: (NOTE) Fact Sheet for Patients: bloggercourse.com  Fact Sheet for Healthcare Providers: seriousbroker.it  This test is not yet approved  or cleared by the [place]  FDA and has been authorized for detection and/or diagnosis of SARS-CoV-2 by FDA under an Emergency Use Authorization (EUA). This EUA will remain in effect (meaning this test can be used) for the duration of the COVID-19 declaration under Section 564(b)(1) of the Act, 21 U.S.C. section 360bbb-3(b)(1), unless the authorization is terminated or revoked.  Performed at [facility], [address] [place], [place] [address]   Respiratory (~20 pathogens) panel by PCR     Status: Abnormal   Collection Time: 08/21/24  8:23 AM   Specimen: Nasopharyngeal Swab; Respiratory  Result Value Ref Range Status   Adenovirus NOT DETECTED NOT DETECTED Final   Coronavirus 229E NOT DETECTED NOT DETECTED Final    Comment: (NOTE) The Coronavirus on the Respiratory Panel, DOES NOT test for the novel  Coronavirus (2019 nCoV)    Coronavirus HKU1 NOT DETECTED NOT DETECTED Final   Coronavirus NL63 NOT DETECTED NOT DETECTED Final   Coronavirus OC43 NOT DETECTED NOT DETECTED Final   Metapneumovirus NOT DETECTED NOT DETECTED Final   Rhinovirus / Enterovirus NOT DETECTED NOT DETECTED Final   Influenza A NOT DETECTED NOT DETECTED Final   Influenza B NOT DETECTED NOT DETECTED Final   Parainfluenza Virus 1 DETECTED (A) NOT DETECTED Final   Parainfluenza Virus 2 NOT DETECTED NOT DETECTED Final   Parainfluenza Virus 3 NOT DETECTED NOT DETECTED Final   Parainfluenza Virus 4 NOT DETECTED NOT DETECTED Final   Respiratory Syncytial Virus NOT DETECTED NOT DETECTED Final   Bordetella pertussis NOT DETECTED NOT DETECTED Final   Bordetella Parapertussis NOT DETECTED NOT DETECTED Final   Chlamydophila pneumoniae NOT DETECTED NOT DETECTED Final   Mycoplasma pneumoniae NOT DETECTED NOT DETECTED Final    Comment: Performed at [facility], [address] [place], [place] [address]  MRSA Next Gen by PCR, Nasal     Status: Abnormal   Collection Time: 08/21/24  8:23 AM   Specimen:  Nasal Mucosa; Nasal Swab  Result Value Ref Range Status   MRSA by PCR Next Gen DETECTED (A) NOT DETECTED Final    Comment: RESULT CALLED TO, READ BACK BY AND VERIFIED WITH: ALLIE T. RN 1032 08/21/24 HNM (NOTE) The GeneXpert MRSA Assay (FDA approved for NASAL specimens only), is one component of a comprehensive MRSA colonization surveillance program. It is not intended to diagnose MRSA infection nor to guide or monitor treatment for MRSA infections. Test performance is not FDA approved in patients less than 59 years old. Performed at [facility], [address] [place], [place] [address]          Radiology Studies: No results found.      Scheduled Meds:  enoxaparin  (LOVENOX ) injection  60 mg Subcutaneous Q24H   escitalopram   10 mg Oral Daily   feeding supplement  237 mL  Oral BID BM   folic acid   1 mg Oral Daily   multivitamin with minerals  1 tablet Oral Daily   nystatin    Topical BID   thiamine   100 mg Oral Daily   Or   thiamine   100 mg Intravenous Daily   Continuous Infusions:  dextrose        LOS: 14 days       Anthony CHRISTELLA Pouch, MD [facility] Pager [phone]  If 7PM-7AM, please contact night-coverage [email or web address] 08/29/2024, 6:51 AM

## 2024-08-30 DIAGNOSIS — R4182 Altered mental status, unspecified: Secondary | ICD-10-CM | POA: Diagnosis not present

## 2024-08-30 DIAGNOSIS — R509 Fever, unspecified: Secondary | ICD-10-CM | POA: Diagnosis not present

## 2024-08-30 DIAGNOSIS — J122 Parainfluenza virus pneumonia: Secondary | ICD-10-CM | POA: Diagnosis not present

## 2024-08-30 LAB — FOLATE: Folate: >20 ng/mL (ref >5.9)

## 2024-08-30 LAB — COMPREHENSIVE METABOLIC PANEL WITH GFR
ALT: 73 U/L — ABNORMAL HIGH (ref 0–44)
AST: 64 U/L — ABNORMAL HIGH (ref 15–41)
Albumin: 3.5 g/dL (ref 3.5–5.0)
Alkaline Phosphatase: 112 U/L (ref 38–126)
Anion gap: 14 (ref 5–15)
BUN: 20 mg/dL (ref 8–23)
CO2: 22 mmol/L (ref 22–32)
Calcium: 9.6 mg/dL (ref 8.9–10.3)
Chloride: 105 mmol/L (ref 98–111)
Creatinine, Ser: 0.74 mg/dL (ref 0.44–1.00)
GFR, Estimated: >60 mL/min (ref >60)
Glucose, Bld: 150 mg/dL — ABNORMAL HIGH (ref 70–99)
Potassium: 3.9 mmol/L (ref 3.5–5.1)
Sodium: 141 mmol/L (ref 135–145)
Total Bilirubin: 1 mg/dL (ref 0.0–1.2)
Total Protein: 7.5 g/dL (ref 6.5–8.1)

## 2024-08-30 LAB — VITAMIN B12: Vitamin B-12: 377 pg/mL (ref 180–914)

## 2024-08-30 LAB — TSH: TSH: 2.43 u[IU]/mL (ref 0.350–4.500)

## 2024-08-30 MED ORDER — ORAL CARE MOUTH RINSE
15.0000 mL | OROMUCOSAL | Status: DC
  Administered 2024-08-30 – 2024-09-02 (×15): 15 mL via OROMUCOSAL

## 2024-08-30 MED ORDER — ORAL CARE MOUTH RINSE: 15.0000 mL | OROMUCOSAL | Status: DC | PRN

## 2024-08-30 MED ORDER — THIAMINE HCL 100 MG/ML IJ SOLN
500.0000 mg | Freq: Three times a day (TID) | INTRAVENOUS | Status: AC
  Administered 2024-08-30 – 2024-09-02 (×8): 500 mg via INTRAVENOUS
  Filled 2024-08-30 (×11): qty 5

## 2024-08-30 NOTE — Progress Notes (Signed)
 PROGRESS NOTE   HPI was taken from Dr. Hilma: Haley Deleon is a 68 y.o. female with medical history significant of alcohol use disorder, dCHF, CAD, depression with anxiety, morbid obesity, ulcerative colitis, NSVT, PSVT, varicose vein in both legs, carotid artery stenosis, morbid obesity, who presents with left leg swelling and pain, confusion.   Per ED physician, patient initially presented to ED with left leg swelling and pain, and then she developed confusion in ED.  When I saw patient in ED, she is alert, orientated to place and person, not to the time.  She moves all extremities normally.  No facial droop or slurred speech.  She has bilateral lower leg swelling, the left leg is much worse than the right.  She does not have pain in the right leg.  She denies fall or injury.  No fever or chills.  She has a varicose vein in both legs.  Patient denies chest pain, cough, SOB.  No nausea, vomiting, diarrhea or abdominal pain.  No symptoms UTI.  Patient has candidiasis in groin areas. Per her granddaughter at the bedside, patient used to drink liquor almost every day. Recently she drinks less, and last drinking was several days ago.  Patient states that she only drinks once a week recently.   Data reviewed independently and ED Course: pt was found to have WBC 10.1, GFR> 60, UA (cloudy appearance, small amount of leukocyte, rare bacteria, WBC  21-50), ammonia 19, GFR> 60, negative UDS.  Temperature normal, blood pressure 159/99, heart rate of 107, RR 17, oxygen saturation 100% on room air.  CT of head negative for acute intracranial abnormalities.  Left lower extremity venous Doppler negative for DVT.  Patient is admitted to PCU as inpatient.     AGAM TUOHY  FMW:[id number] DOB: [birth year] DOA: 08/15/2024 PCP: Ricard Tawni KIDD, MD    Assessment & Plan:   Principal Problem:   Acute metabolic encephalopathy Active Problems:   UTI (urinary tract infection)   Alcohol withdrawal (HCC)   Pain  and swelling of left lower leg   Chronic diastolic CHF (congestive heart failure) (HCC)   Ulcerative colitis (HCC)   Intertriginous candidiasis   Depression   Obesity, Class III, BMI 40-49.9 (morbid obesity) (HCC)  Assessment and Plan:  Acute metabolic encephalopathy: likely multifactorial, ethanol w/drawal, UTI and parainfluenza virus. Re-orient prn. Mental status is slightly better today. CT head shows no acute intracranial hemorrhage or mass effect. Hyperattenuating intracranial vasculature, particularly the left MCA, which may reflect elevated hematocrit. MRI brain shows no acute intracranial abnormalities. Neurology consulted.    UTI: urine cx grew proteus and e. coli. Completed abx course.   Parainfluenza virus: continue w/ supportive care   Fever: possibly secondary to parainfluenza virus. Procal 0.10. Blood cxs NGTD. ID following and recs aprpec   Hypernatremia: resolved  Groin candida: continue on nystatin    Unlikely pneumonia: repeat CXR on 08/22/24 shows no acute abnormalities. Previous XR findings likely secondary to atelectasis. Repeat CXR requested per pt's family shows no acute cardiopulmonary abnormalities     Chronic venous stasis: w/ pain and swelling of LLE. US  of LLE was neg for DVT    Chronic diastolic CHF: echo on 02/24/2021 showed EF 60 to 65%. Appears compensated. Monitor I/Os  Depression: severity unknown. Continue on home dose of lexapro    Obesity: BMI 39.3. Complicates overall care & prognosis         DVT prophylaxis: Lovenox  Code Status: full  Family Communication: discussed pt's care w/ pt's  granddaughter, [name], and answered her questions Disposition Plan: likely d/c to SNF  Level of care: Med-Surg  Status is: Inpatient Remains inpatient appropriate because: AMS & fevers still     Consultants:  ID Neuro   Procedures:   Antimicrobials:   Subjective: Pt is more awake & answering questions today but still confused    Objective: Vitals:   08/29/24 2131 08/30/24 0224 08/30/24 0500 08/30/24 0846  BP: (!) 166/87 128/71  113/74  Pulse: (!) 108 83  86  Resp: 20 19  18   Temp: (!) 101.8 F (38.8 C) 99.7 F (37.6 C)  99 F (37.2 C)  TempSrc: Axillary Axillary    SpO2: 92% 97%  99%  Weight:   113.9 kg   Height:        Intake/Output Summary (Last 24 hours) at 08/30/2024 1021 Last data filed at 08/30/2024 1002 Gross per 24 hour  Intake 692 ml  Output --  Net 692 ml    Filed Weights   08/27/24 0344 08/28/24 0500 08/30/24 0500  Weight: 114 kg 117.3 kg 113.9 kg    Examination:  General exam: appears comfortable  Respiratory system: diminished breath sounds b/l  Cardiovascular system: S1/S2+. No rubs or gallops Gastrointestinal system: abd is soft, NT, obese & hypoactive bowel sounds Central nervous system: more awake. Intermittently answers questions Psychiatry: judgement and insight appears slightly improved.    Data Reviewed: I have personally reviewed following labs and imaging studies  CBC: Recent Labs  Lab 08/24/24 0438 08/25/24 0518 08/26/24 0832 08/27/24 0530 08/29/24 0434  WBC 7.2 7.3 7.5 8.7 9.7  NEUTROABS 4.1 4.3 5.0 5.9  --   HGB 14.1 14.3 14.2 15.0 14.7  HCT 42.5 43.0 42.7 46.9* 44.9  MCV 90.4 90.3 89.7 92.5 90.9  PLT 190 210 229 289 353   Basic Metabolic Panel: Recent Labs  Lab 08/24/24 0438 08/25/24 0518 08/26/24 0832 08/27/24 0530 08/29/24 0434  NA 135 138 139 139 146*  K 4.0 3.8 3.7 4.0 4.0  CL 100 105 106 104 108  CO2 26 23 23 22 22   GLUCOSE 94 103* 109* 122* 118*  BUN 23 20 16 15 16   CREATININE 0.74 0.60 0.62 0.68 0.77  CALCIUM 8.7* 8.7* 8.9 9.4 9.3   GFR: Estimated Creatinine Clearance: 88.9 mL/min (by C-G formula based on SCr of 0.77 mg/dL). Liver Function Tests: Recent Labs  Lab 08/29/24 0434  AST 71*  ALT 71*  ALKPHOS 109  BILITOT 0.7  PROT 7.1  ALBUMIN 3.5   No results for input(s): LIPASE, AMYLASE in the last 168 hours. Recent  Labs  Lab 08/29/24 0434  AMMONIA 25   Coagulation Profile: No results for input(s): INR, PROTIME in the last 168 hours. Cardiac Enzymes: No results for input(s): CKTOTAL, CKMB, CKMBINDEX, TROPONINI in the last 168 hours. BNP (last 3 results) Recent Labs    08/15/24 2238  PROBNP 483.0*   HbA1C: No results for input(s): HGBA1C in the last 72 hours. CBG: Recent Labs  Lab 08/27/24 2307  GLUCAP 112*   Lipid Profile: No results for input(s): CHOL, HDL, LDLCALC, TRIG, CHOLHDL, LDLDIRECT in the last 72 hours. Thyroid Function Tests: No results for input(s): TSH, T4TOTAL, FREET4, T3FREE, THYROIDAB in the last 72 hours. Anemia Panel: No results for input(s): VITAMINB12, FOLATE, FERRITIN, TIBC, IRON, RETICCTPCT in the last 72 hours. Sepsis Labs: Recent Labs  Lab 08/28/24 1340  PROCALCITON 0.10    Recent Results (from the past 240 hours)  Resp panel by RT-PCR (RSV, Flu  A&B, Covid) Anterior Nasal Swab     Status: None   Collection Time: 08/20/24  5:34 PM   Specimen: Anterior Nasal Swab  Result Value Ref Range Status   SARS Coronavirus 2 by RT PCR NEGATIVE NEGATIVE Final    Comment: (NOTE) SARS-CoV-2 target nucleic acids are NOT DETECTED.  The SARS-CoV-2 RNA is generally detectable in upper respiratory specimens during the acute phase of infection. The lowest concentration of SARS-CoV-2 viral copies this assay can detect is 138 copies/mL. A negative result does not preclude SARS-Cov-2 infection and should not be used as the sole basis for treatment or other patient management decisions. A negative result may occur with  improper specimen collection/handling, submission of specimen other than nasopharyngeal swab, presence of viral mutation(s) within the areas targeted by this assay, and inadequate number of viral copies(<138 copies/mL). A negative result must be combined with clinical observations, patient history, and  epidemiological information. The expected result is Negative.  Fact Sheet for Patients:  bloggercourse.com  Fact Sheet for Healthcare Providers:  seriousbroker.it  This test is no t yet approved or cleared by the [place]  FDA and  has been authorized for detection and/or diagnosis of SARS-CoV-2 by FDA under an Emergency Use Authorization (EUA). This EUA will remain  in effect (meaning this test can be used) for the duration of the COVID-19 declaration under Section 564(b)(1) of the Act, 21 U.S.C.section 360bbb-3(b)(1), unless the authorization is terminated  or revoked sooner.       Influenza A by PCR NEGATIVE NEGATIVE Final   Influenza B by PCR NEGATIVE NEGATIVE Final    Comment: (NOTE) The Xpert Xpress SARS-CoV-2/FLU/RSV plus assay is intended as an aid in the diagnosis of influenza from Nasopharyngeal swab specimens and should not be used as a sole basis for treatment. Nasal washings and aspirates are unacceptable for Xpert Xpress SARS-CoV-2/FLU/RSV testing.  Fact Sheet for Patients: bloggercourse.com  Fact Sheet for Healthcare Providers: seriousbroker.it  This test is not yet approved or cleared by the [place]  FDA and has been authorized for detection and/or diagnosis of SARS-CoV-2 by FDA under an Emergency Use Authorization (EUA). This EUA will remain in effect (meaning this test can be used) for the duration of the COVID-19 declaration under Section 564(b)(1) of the Act, 21 U.S.C. section 360bbb-3(b)(1), unless the authorization is terminated or revoked.     Resp Syncytial Virus by PCR NEGATIVE NEGATIVE Final    Comment: (NOTE) Fact Sheet for Patients: bloggercourse.com  Fact Sheet for Healthcare Providers: seriousbroker.it  This test is not yet approved or cleared by the [place]  FDA and has been  authorized for detection and/or diagnosis of SARS-CoV-2 by FDA under an Emergency Use Authorization (EUA). This EUA will remain in effect (meaning this test can be used) for the duration of the COVID-19 declaration under Section 564(b)(1) of the Act, 21 U.S.C. section 360bbb-3(b)(1), unless the authorization is terminated or revoked.  Performed at [facility], [address] [place], [place] [address]   Respiratory (~20 pathogens) panel by PCR     Status: Abnormal   Collection Time: 08/21/24  8:23 AM   Specimen: Nasopharyngeal Swab; Respiratory  Result Value Ref Range Status   Adenovirus NOT DETECTED NOT DETECTED Final   Coronavirus 229E NOT DETECTED NOT DETECTED Final    Comment: (NOTE) The Coronavirus on the Respiratory Panel, DOES NOT test for the novel  Coronavirus (2019 nCoV)    Coronavirus HKU1 NOT DETECTED NOT DETECTED Final   Coronavirus NL63 NOT DETECTED  NOT DETECTED Final   Coronavirus OC43 NOT DETECTED NOT DETECTED Final   Metapneumovirus NOT DETECTED NOT DETECTED Final   Rhinovirus / Enterovirus NOT DETECTED NOT DETECTED Final   Influenza A NOT DETECTED NOT DETECTED Final   Influenza B NOT DETECTED NOT DETECTED Final   Parainfluenza Virus 1 DETECTED (A) NOT DETECTED Final   Parainfluenza Virus 2 NOT DETECTED NOT DETECTED Final   Parainfluenza Virus 3 NOT DETECTED NOT DETECTED Final   Parainfluenza Virus 4 NOT DETECTED NOT DETECTED Final   Respiratory Syncytial Virus NOT DETECTED NOT DETECTED Final   Bordetella pertussis NOT DETECTED NOT DETECTED Final   Bordetella Parapertussis NOT DETECTED NOT DETECTED Final   Chlamydophila pneumoniae NOT DETECTED NOT DETECTED Final   Mycoplasma pneumoniae NOT DETECTED NOT DETECTED Final    Comment: Performed at [facility], [address], [place], [place] [address]  MRSA Next Gen by PCR, Nasal     Status: Abnormal   Collection Time: 08/21/24  8:23 AM   Specimen: Nasal Mucosa; Nasal Swab  Result Value Ref Range  Status   MRSA by PCR Next Gen DETECTED (A) NOT DETECTED Final    Comment: RESULT CALLED TO, READ BACK BY AND VERIFIED WITH: ALLIE T. RN 1032 08/21/24 HNM (NOTE) The GeneXpert MRSA Assay (FDA approved for NASAL specimens only), is one component of a comprehensive MRSA colonization surveillance program. It is not intended to diagnose MRSA infection nor to guide or monitor treatment for MRSA infections. Test performance is not FDA approved in patients less than 78 years old. Performed at [facility], [address] [place], [place] [address]   Culture, blood (Routine X 2) w Reflex to ID Panel     Status: None (Preliminary result)   Collection Time: 08/28/24  1:40 PM   Specimen: BLOOD  Result Value Ref Range Status   Specimen Description BLOOD BLOOD RIGHT HAND  Final   Special Requests   Final    BOTTLES DRAWN AEROBIC AND ANAEROBIC Blood Culture adequate volume   Culture   Final    NO GROWTH 2 DAYS Performed at [facility], [address], [place], [place] [address]    Report Status PENDING  Incomplete  Culture, blood (Routine X 2) w Reflex to ID Panel     Status: None (Preliminary result)   Collection Time: 08/28/24  1:40 PM   Specimen: BLOOD  Result Value Ref Range Status   Specimen Description BLOOD BLOOD LEFT HAND  Final   Special Requests   Final    BOTTLES DRAWN AEROBIC AND ANAEROBIC Blood Culture adequate volume   Culture   Final    NO GROWTH 2 DAYS Performed at [facility], [address], [place], [place] [address]    Report Status PENDING  Incomplete         Radiology Studies: MR BRAIN WO CONTRAST Result Date: 08/30/2024 EXAM: MRI BRAIN WITHOUT CONTRAST 08/29/2024 11:57:09 PM TECHNIQUE: Multiplanar multisequence MRI of the head/brain was performed without the administration of intravenous contrast. COMPARISON: CT head earlier today. CLINICAL HISTORY: Altered mental status. FINDINGS: BRAIN AND VENTRICLES: No acute infarct. No  intracranial hemorrhage. No mass. No midline shift. No hydrocephalus. The sella is unremarkable. Normal flow voids. ORBITS: No acute abnormality. SINUSES AND MASTOIDS: No acute abnormality. BONES AND SOFT TISSUES: Normal marrow signal. No acute soft tissue abnormality. IMPRESSION: 1. No acute intracranial abnormality. Electronically signed by: Gilmore Molt MD 08/30/2024 02:11 AM EST RP Workstation: [id number]   DG Chest Port  1 View Result Date: 08/29/2024 EXAM: 1 VIEW XRAY OF THE CHEST 08/29/2024 08:44:53 AM COMPARISON: None available. CLINICAL HISTORY: Dyspnea. FINDINGS: LUNGS AND PLEURA: Mildly low lung volumes. No focal pulmonary opacity. No pleural effusion. No pneumothorax. HEART AND MEDIASTINUM: Unchanged cardiomegaly. BONES AND SOFT TISSUES: No acute osseous abnormality. IMPRESSION: 1. No acute cardiopulmonary process identified. 2. Cardiomegaly, unchanged. Electronically signed by: Manford Cummins MD 08/29/2024 01:23 PM EST RP Workstation: [id number]   CT HEAD WO CONTRAST ( ) Result Date: 08/29/2024 EXAM: CT HEAD WITHOUT CONTRAST 08/29/2024 08:34:51 AM TECHNIQUE: CT of the head was performed without the administration of intravenous contrast. Automated exposure control, iterative reconstruction, and/or weight based adjustment of the mA/kV was utilized to reduce the radiation dose to as low as reasonably achievable. COMPARISON: CT head 08/15/2024. CLINICAL HISTORY: Mental status change, unknown cause. FINDINGS: BRAIN AND VENTRICLES: No acute hemorrhage. No evidence of acute infarct. No hydrocephalus. No extra-axial collection. No mass effect or midline shift. Overall similar mild scattered white matter hypodensities which are nonspecific but most commonly represent chronic microvascular ischemic changes. There is diffusely hyperattenuating appearance of the intracranial vasculature in particular, the left MCA, which may relate to elevated hematocrit. ORBITS: No acute abnormality. SINUSES: No acute  abnormality. SOFT TISSUES AND SKULL: No acute soft tissue abnormality. No skull fracture. IMPRESSION: 1. No acute intracranial hemorrhage or mass effect. 2. Hyperattenuating intracranial vasculature, particularly the left MCA, which may reflect elevated hematocrit. If there is concern for recent ischemic infarction, recommend CTA Head and Neck and/or MRI Brain. Electronically signed by: prentice spade 08/29/2024 09:02 AM EST RP Workstation: [id number]        Scheduled Meds:  enoxaparin  (LOVENOX ) injection  60 mg Subcutaneous Q24H   escitalopram   10 mg Oral Daily   feeding supplement  237 mL Oral BID BM   folic acid   1 mg Oral Daily   multivitamin with minerals  1 tablet Oral Daily   nystatin    Topical BID   mouth rinse  15 mL Mouth Rinse 4 times per day   thiamine   100 mg Oral Daily   Or   thiamine   100 mg Intravenous Daily   Continuous Infusions:     LOS: 15 days       Anthony CHRISTELLA Pouch, MD [facility] Pager [phone]  If 7PM-7AM, please contact night-coverage [email or web address] 08/30/2024, 10:21 AM

## 2024-08-30 NOTE — Progress Notes (Signed)
 INFECTIOUS DISEASE PROGRESS NOTE Date of Admission:  08/15/2024     ID: Haley Deleon is a 68 y.o. female with  fever, ams Principal Problem:   Acute metabolic encephalopathy Active Problems:   Ulcerative colitis (HCC)   Alcohol withdrawal (HCC)   Chronic diastolic CHF (congestive heart failure) (HCC)   Intertriginous candidiasis   Depression   Pain and swelling of left lower leg   Obesity, Class III, BMI 40-49.9 (morbid obesity) (HCC)   UTI (urinary tract infection)   Subjective: Febrile yest but not today. Much more alert today but still confused. Coughing on exam  ROS  Unable to obtain   Medications:  Antibiotics Given (last 72 hours)     None       enoxaparin  (LOVENOX ) injection  60 mg Subcutaneous Q24H   escitalopram   10 mg Oral Daily   feeding supplement  237 mL Oral BID BM   folic acid   1 mg Oral Daily   multivitamin with minerals  1 tablet Oral Daily   nystatin    Topical BID   mouth rinse  15 mL Mouth Rinse 4 times per day   thiamine   100 mg Oral Daily   Or   thiamine   100 mg Intravenous Daily    Objective: Vital signs in last 24 hours: Temp:  [99 F (37.2 C)-101.8 F (38.8 C)] 99 F (37.2 C) (12/10 0846) Pulse Rate:  [83-108] 86 (12/10 0846) Resp:  [18-22] 18 (12/10 0846) BP: (113-166)/(71-87) 113/74 (12/10 0846) SpO2:  [92 %-99 %] 99 % (12/10 0846) Weight:  [113.9 kg] 113.9 kg (12/10 0500) Constitutional:  opens eyes and verbalizes some. HENT: [place]/AT, PERRLA, no scleral icterus Mouth/Throat: Oropharynx is dry Neck supple Cardiovascular: Normal rate, regular rhythm and normal heart sounds.  Pulmonary/Chest:  Poor air movement  Neck = diff to assess neck movemetn Abdominal: Soft. Bowel sounds are normal.  exhibits no distension.  Lymphadenopathy: no cervical adenopathy. No axillary adenopathy Neurological:  more interactive Skin: some abrasions and bruising.   Lab Results Recent Labs    08/29/24 0434 08/30/24 1045  WBC 9.7  --   HGB 14.7   --   HCT 44.9  --   NA 146* 141  K 4.0 3.9  CL 108 105  CO2 22 22  BUN 16 20  CREATININE 0.77 0.74    Microbiology: Results for orders placed or performed during the hospital encounter of 08/15/24  Urine Culture (for pregnant, neutropenic or urologic patients or patients with an indwelling urinary catheter)     Status: Abnormal   Collection Time: 08/16/24  5:07 AM   Specimen: Urine, Clean Catch  Result Value Ref Range Status   Specimen Description   Final    URINE, CLEAN CATCH Performed at [facility], [address] [place], [place] [address]    Special Requests   Final    NONE Performed at [facility], [address] [place], [place] [address]    Culture (A)  Final    30,000 COLONIES/mL PROTEUS MIRABILIS >=100,000 COLONIES/mL ESCHERICHIA COLI    Report Status 08/20/2024 FINAL  Final   Organism ID, Bacteria PROTEUS MIRABILIS (A)  Final   Organism ID, Bacteria ESCHERICHIA COLI (A)  Final      Susceptibility   Escherichia coli - MIC*    AMPICILLIN >=32 RESISTANT Resistant     CEFAZOLIN  (URINE) Value in next row Sensitive      2 SENSITIVEThis is a modified FDA-approved test that has been validated and  its performance characteristics determined by the reporting laboratory.  This laboratory is certified under the Clinical Laboratory Improvement Amendments CLIA as qualified to perform high complexity clinical laboratory testing.    CEFEPIME Value in next row Sensitive      2 SENSITIVEThis is a modified FDA-approved test that has been validated and its performance characteristics determined by the reporting laboratory.  This laboratory is certified under the Clinical Laboratory Improvement Amendments CLIA as qualified to perform high complexity clinical laboratory testing.    ERTAPENEM Value in next row Sensitive      2 SENSITIVEThis is a modified FDA-approved test that has been validated and its performance characteristics determined by the reporting  laboratory.  This laboratory is certified under the Clinical Laboratory Improvement Amendments CLIA as qualified to perform high complexity clinical laboratory testing.    CEFTRIAXONE  Value in next row Sensitive      2 SENSITIVEThis is a modified FDA-approved test that has been validated and its performance characteristics determined by the reporting laboratory.  This laboratory is certified under the Clinical Laboratory Improvement Amendments CLIA as qualified to perform high complexity clinical laboratory testing.    CIPROFLOXACIN Value in next row Sensitive      2 SENSITIVEThis is a modified FDA-approved test that has been validated and its performance characteristics determined by the reporting laboratory.  This laboratory is certified under the Clinical Laboratory Improvement Amendments CLIA as qualified to perform high complexity clinical laboratory testing.    GENTAMICIN Value in next row Sensitive      2 SENSITIVEThis is a modified FDA-approved test that has been validated and its performance characteristics determined by the reporting laboratory.  This laboratory is certified under the Clinical Laboratory Improvement Amendments CLIA as qualified to perform high complexity clinical laboratory testing.    NITROFURANTOIN Value in next row Sensitive      2 SENSITIVEThis is a modified FDA-approved test that has been validated and its performance characteristics determined by the reporting laboratory.  This laboratory is certified under the Clinical Laboratory Improvement Amendments CLIA as qualified to perform high complexity clinical laboratory testing.    TRIMETH/SULFA Value in next row Sensitive      2 SENSITIVEThis is a modified FDA-approved test that has been validated and its performance characteristics determined by the reporting laboratory.  This laboratory is certified under the Clinical Laboratory Improvement Amendments CLIA as qualified to perform high complexity clinical laboratory testing.     AMPICILLIN/SULBACTAM Value in next row Sensitive      2 SENSITIVEThis is a modified FDA-approved test that has been validated and its performance characteristics determined by the reporting laboratory.  This laboratory is certified under the Clinical Laboratory Improvement Amendments CLIA as qualified to perform high complexity clinical laboratory testing.    PIP/TAZO Value in next row Sensitive      <=4 SENSITIVEThis is a modified FDA-approved test that has been validated and its performance characteristics determined by the reporting laboratory.  This laboratory is certified under the Clinical Laboratory Improvement Amendments CLIA as qualified to perform high complexity clinical laboratory testing.    MEROPENEM Value in next row Sensitive      <=4 SENSITIVEThis is a modified FDA-approved test that has been validated and its performance characteristics determined by the reporting laboratory.  This laboratory is certified under the Clinical Laboratory Improvement Amendments CLIA as qualified to perform high complexity clinical laboratory testing.    * >=100,000 COLONIES/mL ESCHERICHIA COLI   Proteus mirabilis - MIC*    AMPICILLIN Value  in next row Sensitive      <=4 SENSITIVEThis is a modified FDA-approved test that has been validated and its performance characteristics determined by the reporting laboratory.  This laboratory is certified under the Clinical Laboratory Improvement Amendments CLIA as qualified to perform high complexity clinical laboratory testing.    CEFAZOLIN  (URINE) Value in next row Sensitive      8 SENSITIVEThis is a modified FDA-approved test that has been validated and its performance characteristics determined by the reporting laboratory.  This laboratory is certified under the Clinical Laboratory Improvement Amendments CLIA as qualified to perform high complexity clinical laboratory testing.    CEFEPIME Value in next row Sensitive      8 SENSITIVEThis is a modified  FDA-approved test that has been validated and its performance characteristics determined by the reporting laboratory.  This laboratory is certified under the Clinical Laboratory Improvement Amendments CLIA as qualified to perform high complexity clinical laboratory testing.    ERTAPENEM Value in next row Sensitive      8 SENSITIVEThis is a modified FDA-approved test that has been validated and its performance characteristics determined by the reporting laboratory.  This laboratory is certified under the Clinical Laboratory Improvement Amendments CLIA as qualified to perform high complexity clinical laboratory testing.    CEFTRIAXONE  Value in next row Sensitive      8 SENSITIVEThis is a modified FDA-approved test that has been validated and its performance characteristics determined by the reporting laboratory.  This laboratory is certified under the Clinical Laboratory Improvement Amendments CLIA as qualified to perform high complexity clinical laboratory testing.    CIPROFLOXACIN Value in next row Sensitive      8 SENSITIVEThis is a modified FDA-approved test that has been validated and its performance characteristics determined by the reporting laboratory.  This laboratory is certified under the Clinical Laboratory Improvement Amendments CLIA as qualified to perform high complexity clinical laboratory testing.    GENTAMICIN Value in next row Sensitive      8 SENSITIVEThis is a modified FDA-approved test that has been validated and its performance characteristics determined by the reporting laboratory.  This laboratory is certified under the Clinical Laboratory Improvement Amendments CLIA as qualified to perform high complexity clinical laboratory testing.    NITROFURANTOIN Value in next row Resistant      8 SENSITIVEThis is a modified FDA-approved test that has been validated and its performance characteristics determined by the reporting laboratory.  This laboratory is certified under the Clinical  Laboratory Improvement Amendments CLIA as qualified to perform high complexity clinical laboratory testing.    TRIMETH/SULFA Value in next row Sensitive      8 SENSITIVEThis is a modified FDA-approved test that has been validated and its performance characteristics determined by the reporting laboratory.  This laboratory is certified under the Clinical Laboratory Improvement Amendments CLIA as qualified to perform high complexity clinical laboratory testing.    AMPICILLIN/SULBACTAM Value in next row Sensitive      8 SENSITIVEThis is a modified FDA-approved test that has been validated and its performance characteristics determined by the reporting laboratory.  This laboratory is certified under the Clinical Laboratory Improvement Amendments CLIA as qualified to perform high complexity clinical laboratory testing.    PIP/TAZO Value in next row Sensitive      <=4 SENSITIVEThis is a modified FDA-approved test that has been validated and its performance characteristics determined by the reporting laboratory.  This laboratory is certified under the Clinical Laboratory Improvement Amendments CLIA as qualified to perform high complexity  clinical laboratory testing.    MEROPENEM Value in next row Sensitive      <=4 SENSITIVEThis is a modified FDA-approved test that has been validated and its performance characteristics determined by the reporting laboratory.  This laboratory is certified under the Clinical Laboratory Improvement Amendments CLIA as qualified to perform high complexity clinical laboratory testing.    * 30,000 COLONIES/mL PROTEUS MIRABILIS  Resp panel by RT-PCR (RSV, Flu A&B, Covid) Anterior Nasal Swab     Status: None   Collection Time: 08/20/24  5:34 PM   Specimen: Anterior Nasal Swab  Result Value Ref Range Status   SARS Coronavirus 2 by RT PCR NEGATIVE NEGATIVE Final    Comment: (NOTE) SARS-CoV-2 target nucleic acids are NOT DETECTED.  The SARS-CoV-2 RNA is generally detectable in upper  respiratory specimens during the acute phase of infection. The lowest concentration of SARS-CoV-2 viral copies this assay can detect is 138 copies/mL. A negative result does not preclude SARS-Cov-2 infection and should not be used as the sole basis for treatment or other patient management decisions. A negative result may occur with  improper specimen collection/handling, submission of specimen other than nasopharyngeal swab, presence of viral mutation(s) within the areas targeted by this assay, and inadequate number of viral copies(<138 copies/mL). A negative result must be combined with clinical observations, patient history, and epidemiological information. The expected result is Negative.  Fact Sheet for Patients:  bloggercourse.com  Fact Sheet for Healthcare Providers:  seriousbroker.it  This test is no t yet approved or cleared by the [place]  FDA and  has been authorized for detection and/or diagnosis of SARS-CoV-2 by FDA under an Emergency Use Authorization (EUA). This EUA will remain  in effect (meaning this test can be used) for the duration of the COVID-19 declaration under Section 564(b)(1) of the Act, 21 U.S.C.section 360bbb-3(b)(1), unless the authorization is terminated  or revoked sooner.       Influenza A by PCR NEGATIVE NEGATIVE Final   Influenza B by PCR NEGATIVE NEGATIVE Final    Comment: (NOTE) The Xpert Xpress SARS-CoV-2/FLU/RSV plus assay is intended as an aid in the diagnosis of influenza from Nasopharyngeal swab specimens and should not be used as a sole basis for treatment. Nasal washings and aspirates are unacceptable for Xpert Xpress SARS-CoV-2/FLU/RSV testing.  Fact Sheet for Patients: bloggercourse.com  Fact Sheet for Healthcare Providers: seriousbroker.it  This test is not yet approved or cleared by the [place]  FDA and has been  authorized for detection and/or diagnosis of SARS-CoV-2 by FDA under an Emergency Use Authorization (EUA). This EUA will remain in effect (meaning this test can be used) for the duration of the COVID-19 declaration under Section 564(b)(1) of the Act, 21 U.S.C. section 360bbb-3(b)(1), unless the authorization is terminated or revoked.     Resp Syncytial Virus by PCR NEGATIVE NEGATIVE Final    Comment: (NOTE) Fact Sheet for Patients: bloggercourse.com  Fact Sheet for Healthcare Providers: seriousbroker.it  This test is not yet approved or cleared by the [place]  FDA and has been authorized for detection and/or diagnosis of SARS-CoV-2 by FDA under an Emergency Use Authorization (EUA). This EUA will remain in effect (meaning this test can be used) for the duration of the COVID-19 declaration under Section 564(b)(1) of the Act, 21 U.S.C. section 360bbb-3(b)(1), unless the authorization is terminated or revoked.  Performed at [facility], [address] [place], [place] [address]   Respiratory (~20 pathogens) panel by PCR     Status: Abnormal  Collection Time: 08/21/24  8:23 AM   Specimen: Nasopharyngeal Swab; Respiratory  Result Value Ref Range Status   Adenovirus NOT DETECTED NOT DETECTED Final   Coronavirus 229E NOT DETECTED NOT DETECTED Final    Comment: (NOTE) The Coronavirus on the Respiratory Panel, DOES NOT test for the novel  Coronavirus (2019 nCoV)    Coronavirus HKU1 NOT DETECTED NOT DETECTED Final   Coronavirus NL63 NOT DETECTED NOT DETECTED Final   Coronavirus OC43 NOT DETECTED NOT DETECTED Final   Metapneumovirus NOT DETECTED NOT DETECTED Final   Rhinovirus / Enterovirus NOT DETECTED NOT DETECTED Final   Influenza A NOT DETECTED NOT DETECTED Final   Influenza B NOT DETECTED NOT DETECTED Final   Parainfluenza Virus 1 DETECTED (A) NOT DETECTED Final   Parainfluenza Virus 2 NOT DETECTED NOT DETECTED  Final   Parainfluenza Virus 3 NOT DETECTED NOT DETECTED Final   Parainfluenza Virus 4 NOT DETECTED NOT DETECTED Final   Respiratory Syncytial Virus NOT DETECTED NOT DETECTED Final   Bordetella pertussis NOT DETECTED NOT DETECTED Final   Bordetella Parapertussis NOT DETECTED NOT DETECTED Final   Chlamydophila pneumoniae NOT DETECTED NOT DETECTED Final   Mycoplasma pneumoniae NOT DETECTED NOT DETECTED Final    Comment: Performed at [facility], [address] [place], [place] [address]  MRSA Next Gen by PCR, Nasal     Status: Abnormal   Collection Time: 08/21/24  8:23 AM   Specimen: Nasal Mucosa; Nasal Swab  Result Value Ref Range Status   MRSA by PCR Next Gen DETECTED (A) NOT DETECTED Final    Comment: RESULT CALLED TO, READ BACK BY AND VERIFIED WITH: ALLIE T. RN 1032 08/21/24 HNM (NOTE) The GeneXpert MRSA Assay (FDA approved for NASAL specimens only), is one component of a comprehensive MRSA colonization surveillance program. It is not intended to diagnose MRSA infection nor to guide or monitor treatment for MRSA infections. Test performance is not FDA approved in patients less than 40 years old. Performed at [facility], [address] [place], [place] [address]   Culture, blood (Routine X 2) w Reflex to ID Panel     Status: None (Preliminary result)   Collection Time: 08/28/24  1:40 PM   Specimen: BLOOD  Result Value Ref Range Status   Specimen Description BLOOD BLOOD RIGHT HAND  Final   Special Requests   Final    BOTTLES DRAWN AEROBIC AND ANAEROBIC Blood Culture adequate volume   Culture   Final    NO GROWTH 2 DAYS Performed at [facility], [address], [place], [place] [address]    Report Status PENDING  Incomplete  Culture, blood (Routine X 2) w Reflex to ID Panel     Status: None (Preliminary result)   Collection Time: 08/28/24  1:40 PM   Specimen: BLOOD  Result Value Ref Range Status   Specimen Description BLOOD BLOOD LEFT HAND  Final    Special Requests   Final    BOTTLES DRAWN AEROBIC AND ANAEROBIC Blood Culture adequate volume   Culture   Final    NO GROWTH 2 DAYS Performed at [facility], [address] [place], [place] [address]    Report Status PENDING  Incomplete    Studies/Results: MR BRAIN WO CONTRAST Result Date: 08/30/2024 EXAM: MRI BRAIN WITHOUT CONTRAST 08/29/2024 11:57:09 PM TECHNIQUE: Multiplanar multisequence MRI of the head/brain was performed without the administration of intravenous contrast. COMPARISON: CT head earlier today. CLINICAL HISTORY: Altered mental status. FINDINGS: BRAIN AND VENTRICLES:  No acute infarct. No intracranial hemorrhage. No mass. No midline shift. No hydrocephalus. The sella is unremarkable. Normal flow voids. ORBITS: No acute abnormality. SINUSES AND MASTOIDS: No acute abnormality. BONES AND SOFT TISSUES: Normal marrow signal. No acute soft tissue abnormality. IMPRESSION: 1. No acute intracranial abnormality. Electronically signed by: Gilmore Molt MD 08/30/2024 02:11 AM EST RP Workstation: [id number]   DG Chest Port 1 View Result Date: 08/29/2024 EXAM: 1 VIEW XRAY OF THE CHEST 08/29/2024 08:44:53 AM COMPARISON: None available. CLINICAL HISTORY: Dyspnea. FINDINGS: LUNGS AND PLEURA: Mildly low lung volumes. No focal pulmonary opacity. No pleural effusion. No pneumothorax. HEART AND MEDIASTINUM: Unchanged cardiomegaly. BONES AND SOFT TISSUES: No acute osseous abnormality. IMPRESSION: 1. No acute cardiopulmonary process identified. 2. Cardiomegaly, unchanged. Electronically signed by: Manford Cummins MD 08/29/2024 01:23 PM EST RP Workstation: [id number]   CT HEAD WO CONTRAST ( ) Result Date: 08/29/2024 EXAM: CT HEAD WITHOUT CONTRAST 08/29/2024 08:34:51 AM TECHNIQUE: CT of the head was performed without the administration of intravenous contrast. Automated exposure control, iterative reconstruction, and/or weight based adjustment of the mA/kV was utilized to reduce the radiation  dose to as low as reasonably achievable. COMPARISON: CT head 08/15/2024. CLINICAL HISTORY: Mental status change, unknown cause. FINDINGS: BRAIN AND VENTRICLES: No acute hemorrhage. No evidence of acute infarct. No hydrocephalus. No extra-axial collection. No mass effect or midline shift. Overall similar mild scattered white matter hypodensities which are nonspecific but most commonly represent chronic microvascular ischemic changes. There is diffusely hyperattenuating appearance of the intracranial vasculature in particular, the left MCA, which may relate to elevated hematocrit. ORBITS: No acute abnormality. SINUSES: No acute abnormality. SOFT TISSUES AND SKULL: No acute soft tissue abnormality. No skull fracture. IMPRESSION: 1. No acute intracranial hemorrhage or mass effect. 2. Hyperattenuating intracranial vasculature, particularly the left MCA, which may reflect elevated hematocrit. If there is concern for recent ischemic infarction, recommend CTA Head and Neck and/or MRI Brain. Electronically signed by: prentice spade 08/29/2024 09:02 AM EST RP Workstation: [id number]    Assessment/Plan: MILEYDI MILSAP is a 68 y.o. female with AMS and fevers recurring after initial treatment for UTI and CAP. + parainfluenza on 12/1 Despite fevers wbc and procalc nml. CXR no change CT with chronic changes in head. MRI Pending BP HR stable  Multiple possible etiologies for the fevers including UTI, aspiration, bacteremia, DVT, skin soft tissue infection   12/10 defervescing. Some improvement Mental status. UA not a clean catch but mixed. Some cough today. MRI head neg Neck is supple so doubt meningitis and the waxing and waning mental status would be unusual for bacterial meningitis  Recommendations Cont to hold abx - I suspect she had aspiration pneumonitis  as cause of her fevers but cxr neg, procalc neg and wbc nml.  Neurology to see  If becomes unstable would start vanco and cefepime. Thank you very much for  the consult. Will follow with you.  Haley Deleon   08/30/2024, 12:55 PM

## 2024-08-30 NOTE — Progress Notes (Signed)
 Physical Therapy Treatment Patient Details Name: Haley Deleon MRN: [id number] DOB: Aug 06, 1956 Today's Date: 08/30/2024   History of Present Illness Pt is a 68 y.o. female who presents with left leg swelling and pain, confusion. MD assessment: Acute metabolic encephalopathy, UTI and possible alcohol withdrawal. PMH of alcohol use disorder, dCHF, CAD, depression with anxiety, morbid obesity, ulcerative colitis, NSVT, PSVT, varicose vein in both legs, carotid artery stenosis, morbid obesity    PT Comments  Pt received upright in bed agreeable to PT/OT co-treat. Pt remains lethargic. Following simple commands inconsistently and with increased time. Requiring heavy mod multi modal cuing to participate. Heavy focus on safely progressing functional mobility. Pt reliant on maxA+2 and bed features to sit EOB. Heavy posterior lean with pt inability to maintain statically without at least maxA+1. Tolerating sitting ~12-15 min EOB. Notable L cervical lateral flexion with R cervical rotation indicative of developing L sided torticollis from poor positioning in bed and deconditioned state. ~5 min spent seated EOB with gentle cervical stretching for L SCM and upper trap with R lateral flexion of cervical paraspinals with and without bouts of L cervical rotation to improve L SCM flexibility. Pt tolerating stretching well but when PT is complete pt too weak to maintain improved ranges of motion. Remains reliant on maxA+1 from OT to maintain static sitting. Pt positioned in L side lying with head on x3 pillows to stretch cervical musculature. Pillows placed b/t knees and at lower back to maintain positioning. Pt making slow progress in POC. D/c recs remain appropriate.     If plan is discharge home, recommend the following: Two people to help with walking and/or transfers;Two people to help with bathing/dressing/bathroom;Help with stairs or ramp for entrance;Assist for transportation;Assistance with feeding;Assistance  with cooking/housework;Direct supervision/assist for financial management;Supervision due to cognitive status   Can travel by private vehicle     No  Equipment Recommendations       Recommendations for Other Services       Precautions / Restrictions Precautions Precautions: Fall Recall of Precautions/Restrictions: Impaired Restrictions Weight Bearing Restrictions Per Provider Order: No     Mobility  Bed Mobility Overal bed mobility: Needs Assistance Bed Mobility: Rolling, Supine to Sit, Sit to Supine Rolling: +2 for physical assistance, Max assist   Supine to sit: Max assist, HOB elevated, Used rails Sit to supine: Max assist, Used rails   General bed mobility comments: max-total A to transition to sitting EOB, max-total A to maintain sitting balance Patient Response: Cooperative, Flat affect  Transfers Overall transfer level: Needs assistance                 General transfer comment: TotalA+2 for attempted L lateral scooting.    Ambulation/Gait                   Stairs             Wheelchair Mobility     Tilt Bed Tilt Bed Patient Response: Cooperative, Flat affect  Modified Rankin (Stroke Patients Only)       Balance Overall balance assessment: Needs assistance Sitting-balance support: Feet supported Sitting balance-Leahy Scale: Poor Sitting balance - Comments: approx 10-12 minutes of sitting EOB, max-total A to maintain Postural control: Posterior lean     Standing balance comment: unable in current condition                            Communication Communication Communication: Impaired Factors Affecting  Communication: Reduced clarity of speech  Cognition Arousal: Lethargic Behavior During Therapy: Flat affect                             Following commands: Impaired Following commands impaired: Follows one step commands with increased time    Cueing Cueing Techniques: Verbal cues, Tactile cues,  Visual cues, Gestural cues  Exercises Other Exercises Other Exercises: seated L SCM and upper trap stretches. ~5 min sitting with 20-30 sec bouts with OT assisting with core stability/static sitting. L side lying with head on 3 pillows for L cervical musculature stretch and repositioning for skin integrity.    General Comments General comments (skin integrity, edema, etc.): L sided torticollis present due to SCM tightness. LIkely upper trap contributions. Improved neutral head positioning with L cervical rotation indicative of SCM involvement.      Pertinent Vitals/Pain Pain Assessment Pain Assessment: Faces Faces Pain Scale: Hurts little more Pain Location: generalized. APpears mostly to LE mobility Pain Descriptors / Indicators: Discomfort, Grimacing Pain Intervention(s): Monitored during session, Repositioned    Home Living                          Prior Function            PT Goals (current goals can now be found in the care plan section) Acute Rehab PT Goals PT Goal Formulation: Patient unable to participate in goal setting    Frequency    Min 2X/week      PT Plan      Co-evaluation PT/OT/SLP Co-Evaluation/Treatment: Yes Reason for Co-Treatment: Complexity of the patient's impairments (multi-system involvement);Necessary to address cognition/behavior during functional activity;For patient/therapist safety PT goals addressed during session: Mobility/safety with mobility;Strengthening/ROM OT goals addressed during session: ADL's and self-care;Proper use of Adaptive equipment and DME;Strengthening/ROM      AM-PAC PT 6 Clicks Mobility   Outcome Measure  Help needed turning from your back to your side while in a flat bed without using bedrails?: Total Help needed moving from lying on your back to sitting on the side of a flat bed without using bedrails?: Total Help needed moving to and from a bed to a chair (including a wheelchair)?: Total Help needed  standing up from a chair using your arms (e.g., wheelchair or bedside chair)?: Total Help needed to walk in hospital room?: Total Help needed climbing 3-5 steps with a railing? : Total 6 Click Score: 6    End of Session   Activity Tolerance: Patient limited by lethargy Patient left: in bed;with call bell/phone within reach;with bed alarm set;with family/visitor present Nurse Communication: Mobility status PT Visit Diagnosis: Muscle weakness (generalized) (M62.81);History of falling (Z91.81);Difficulty in walking, not elsewhere classified (R26.2);Other symptoms and signs involving the nervous system (R29.898)     Time: 8584-8556 PT Time Calculation (min) (ACUTE ONLY): 28 min  Charges:    $Therapeutic Activity: 8-22 mins PT General Charges $$ ACUTE PT VISIT: 1 Visit                     Dorina HERO. Fairly IV, PT, DPT Physical Therapist- [place]  [facility] 08/30/2024, 3:33 PM

## 2024-08-30 NOTE — Progress Notes (Signed)
°   08/29/24 2131  Assess: MEWS Score  Temp (!) 101.8 F (38.8 C)  BP (!) 166/87  MAP (mmHg) 111  Pulse Rate (!) 108  Resp 20  SpO2 92 %  O2 Device Room Air  Assess: MEWS Score  MEWS Temp 2  MEWS Systolic 0  MEWS Pulse 1  MEWS RR 0  MEWS LOC 0  MEWS Score 3  MEWS Score Color Yellow  Assess: if the MEWS score is Yellow or Red  Were vital signs accurate and taken at a resting state? Yes  Does the patient meet 2 or more of the SIRS criteria? Yes  Does the patient have a confirmed or suspected source of infection? No  MEWS guidelines implemented  No, previously yellow, continue vital signs every 4 hours  Assess: SIRS CRITERIA  SIRS Temperature  1  SIRS Respirations  0  SIRS Pulse 1  SIRS WBC 0  SIRS Score Sum  2

## 2024-08-30 NOTE — Progress Notes (Signed)
 Occupational Therapy Treatment Patient Details Name: Haley Deleon MRN: [id number] DOB: Apr 09, 1956 Today's Date: 08/30/2024   History of present illness Pt is a 68 y.o. female who presents with left leg swelling and pain, confusion. MD assessment: Acute metabolic encephalopathy, UTI and possible alcohol withdrawal. PMH of alcohol use disorder, dCHF, CAD, depression with anxiety, morbid obesity, ulcerative colitis, NSVT, PSVT, varicose vein in both legs, carotid artery stenosis, morbid obesity   OT comments  Patient seen for OT treatment on this date. Upon arrival to room patient resting in bed asleep, easily awoken and responding appropriately to questions, agreeable to treatment. OT/PT faciliated bed mobility to EOB, total A required x 2, tolerated sitting Eob for 10-12 minutes with torticollis noted with involvement of L SCM and upper trap; PT provided soft tissue massage and stretching with fair tolerance. OT attempted to faciliate trunk rotation in preparation for improving participation in bed mobility with poor tolerance. Patient returned to supine on L side with pillows positioned to facilitate head in midline.  Patient making fair progress toward goals, will continue to follow POC. Discharge recommendation remains appropriate.        If plan is discharge home, recommend the following:  Two people to help with walking and/or transfers;A lot of help with bathing/dressing/bathroom;Two people to help with bathing/dressing/bathroom   Equipment Recommendations  Other (comment) (defer to next level of care)    Recommendations for Other Services      Precautions / Restrictions Precautions Precautions: Fall Recall of Precautions/Restrictions: Impaired Restrictions Weight Bearing Restrictions Per Provider Order: No       Mobility Bed Mobility Overal bed mobility: Needs Assistance Bed Mobility: Rolling, Supine to Sit, Sit to Supine Rolling: +2 for physical assistance, Max assist    Supine to sit: Max assist, HOB elevated, Used rails Sit to supine: Max assist, Used rails   General bed mobility comments: max-total A to transition to sitting EOB, max-total A to maintain sitting balance    Transfers                         Balance Overall balance assessment: Needs assistance Sitting-balance support: Feet supported Sitting balance-Leahy Scale: Poor Sitting balance - Comments: approx 10-12 minutes of sitting EOB, max-total A to maintain Postural control: Posterior lean                                 ADL either performed or assessed with clinical judgement   ADL Overall ADL's : Needs assistance/impaired     Grooming: Maximal assistance                                      Extremity/Trunk Assessment              Vision       Perception     Praxis     Communication Communication Communication: Impaired   Cognition Arousal: Lethargic Behavior During Therapy: Flat affect Cognition: Cognition impaired   Orientation impairments: Time, Situation Awareness: Intellectual awareness impaired Memory impairment (select all impairments): Working civil service fast streamer, Copywriter, advertising, Engineer, structural memory Attention impairment (select first level of impairment): Sustained attention Executive functioning impairment (select all impairments): Initiation, Organization, Sequencing, Reasoning, Problem solving                   Following  commands: Impaired Following commands impaired: Follows one step commands with increased time      Cueing   Cueing Techniques: Verbal cues, Tactile cues, Visual cues, Gestural cues  Exercises Exercises: Other exercises Other Exercises Other Exercises: attempted to facilitate trunk rotation, poor tolerance    Shoulder Instructions       General Comments presents with torticollis, spasm in SCM and/or upper trap    Pertinent Vitals/ Pain       Pain  Assessment Pain Assessment: Faces Faces Pain Scale: Hurts little more Pain Location: generalized Pain Descriptors / Indicators: Discomfort, Grimacing Pain Intervention(s): Monitored during session  Home Living                                          Prior Functioning/Environment              Frequency  Min 2X/week        Progress Toward Goals  OT Goals(current goals can now be found in the care plan section)  Progress towards OT goals: Progressing toward goals  Acute Rehab OT Goals OT Goal Formulation: Patient unable to participate in goal setting Time For Goal Achievement: 09/03/24 Potential to Achieve Goals: Poor ADL Goals Pt Will Perform Eating: bed level;with supervision Pt Will Perform Grooming: with min assist;sitting Pt/caregiver will Perform Home Exercise Program: Increased strength;Increased ROM;Both right and left upper extremity;With minimal assist  Plan      Co-evaluation    PT/OT/SLP Co-Evaluation/Treatment: Yes Reason for Co-Treatment: Complexity of the patient's impairments (multi-system involvement);Necessary to address cognition/behavior during functional activity;For patient/therapist safety PT goals addressed during session: Mobility/safety with mobility;Strengthening/ROM OT goals addressed during session: ADL's and self-care;Proper use of Adaptive equipment and DME;Strengthening/ROM      AM-PAC OT 6 Clicks Daily Activity     Outcome Measure   Help from another person eating meals?: A Lot Help from another person taking care of personal grooming?: A Lot Help from another person toileting, which includes using toliet, bedpan, or urinal?: Total Help from another person bathing (including washing, rinsing, drying)?: Total Help from another person to put on and taking off regular upper body clothing?: A Lot Help from another person to put on and taking off regular lower body clothing?: Total 6 Click Score: 9    End of  Session    OT Visit Diagnosis: Other abnormalities of gait and mobility (R26.89);Muscle weakness (generalized) (M62.81)   Activity Tolerance Patient limited by fatigue   Patient Left in bed;with call bell/phone within reach;with bed alarm set   Nurse Communication Mobility status        Time: 8587-8557 OT Time Calculation (min): 30 min  Charges: OT General Charges $OT Visit: 1 Visit OT Treatments $Self Care/Home Management : 8-22 mins  Rogers Clause, OT/L MSOT, 08/30/2024

## 2024-08-30 NOTE — Consult Note (Addendum)
 Reason for Consult:AMS Requesting Physician: Trudy  CC: AMS  I have been asked by Dr. Trudy to see this patient in consultation for AMS.  HPI: Haley Deleon is an 68 y.o. female who due to mental status is unable to provide any history therefore all history obtained from the chart.   Patient with medical history significant of alcohol use disorder, dCHF, CAD, depression with anxiety, morbid obesity, ulcerative colitis, NSVT, PSVT, varicose vein in both legs, carotid artery stenosis, morbid obesity, who presented initially with left leg swelling and pain.  Developed confusion in the ED.  Initially was not febrile but developed fevers while hospitalized.  Was found to have a UTI and there was infection of the left leg as well.  There was question of PNA but this has been ruled out.  Positive for Parainfluenza virus.   Placed on CIWA protocol on admission.  Has been on Zithromycin, Ceftriaxone , Vancomycin  during her stay.  Currently afrebile and with normal wbc count.  Altered mental status has continued although somewhat better today.      Past Medical History:  Diagnosis Date   Alcohol use disorder    Anemia    Atypical chest pain    Carotid arterial disease    a. 11/2017 Carotid U/S: RICA <50, LICA nl.   Claustrophobia    Coronary artery disease    Diastolic dysfunction    a. 11/2017 Echo: EF 60-65%, no rwma, Gr1 DD. Nl RV fxn.   Dysrhythmia    Family history of adverse reaction to anesthesia    a.) early emergence in 3rd degree relative (1st cousin)   Heart murmur    Liver cyst    Pneumonia    Ulcerative colitis (HCC)    Venous insufficiency     Past Surgical History:  Procedure Laterality Date   BREAST BIOPSY Right 11/02/2011   ultrasound guided biopsy/clip-benign   CHOLECYSTECTOMY N/A 07/29/2017   Procedure: LAPAROSCOPIC CHOLECYSTECTOMY;  Surgeon: Jordis Laneta FALCON, MD;  Location: [facility];  Service: General;  Laterality: N/A;   COLON SURGERY  2014, 2015   DUE TO ULERATIVE  COLITIS WITH COLOSTOMY   COLOSTOMY TAKEDOWN     FOOT SURGERY Bilateral    BONE SPURS    HYSTEROSCOPY WITH D & C N/A 09/11/2022   Procedure: FRACTIONAL DILATATION AND CURETTAGE /HYSTEROSCOPY;  Surgeon: Schermerhorn, Debby PARAS, MD;  Location: [facility];  Service: Gynecology;  Laterality: N/A;   MANDIBLE FRACTURE SURGERY  1991   MOUTH SURGERY     VENTRAL HERNIA REPAIR N/A 07/29/2017   Procedure: LAPAROSCOPIC VENTRAL HERNIA;  Surgeon: Jordis Laneta FALCON, MD;  Location: [facility];  Service: General;  Laterality: N/A;    Family History  Problem Relation Age of Onset   Heart attack Mother 75   Hyperlipidemia Brother    Breast cancer Neg Hx     Social History:  reports that she quit smoking about 19 years ago. Her smoking use included cigarettes. She started smoking about 39 years ago. She has a 20 pack-year smoking history. She has never used smokeless tobacco. She reports current alcohol use. She reports that she does not use drugs.  Allergies  Allergen Reactions   Caffeine Anaphylaxis and Other (See Comments)   Chocolate Anaphylaxis   Chocolate Flavoring Agent (Non-Screening) Anaphylaxis   Cocoa Anaphylaxis   Meloxicam Other (See Comments)    migraine and bleeding  Severe headache  meloxicam    Medications: I have reviewed the patient's current medications. Scheduled:  enoxaparin  (LOVENOX ) injection  60 mg Subcutaneous Q24H   escitalopram   10 mg Oral Daily   feeding supplement  237 mL Oral BID BM   folic acid   1 mg Oral Daily   multivitamin with minerals  1 tablet Oral Daily   nystatin    Topical BID   mouth rinse  15 mL Mouth Rinse 4 times per day   thiamine   100 mg Oral Daily   Or   thiamine   100 mg Intravenous Daily    ROS: Unable to provide due to mental status  Physical Examination: Blood pressure (!) 128/97, pulse 94, temperature 99.2 F (37.3 C), resp. rate 18, height 5' 7 (1.702 m), weight 113.9 kg, SpO2 96%.  HEENT-  Normocephalic, no lesions, without obvious  abnormality.  Normal external eye and conjunctiva.  Normal TM's bilaterally.  Normal auditory canals and external ears. Normal external nose, mucus membranes and septum.  Normal pharynx. Cardiovascular- Single S1, S2  Lungs- CTA Abdomen- soft, non-tender; bowel sounds normal; no masses,  no organomegaly Extremities- brackish changes on lower extremities Musculoskeletal-left knee pain Skin-LE skin changes  Neurological Examination   Mental Status: Lethargic but able to be aroused.  Follows some simple commands.  Speech fluent but dysarthric Cranial Nerves: II: Visual fields grossly normal III,IV, VI: ptosis not present, extra-ocular motions intact bilaterally V,VII: smile symmetric, facial light touch sensation normal bilaterally VIII: hearing normal bilaterally XI: bilateral shoulder shrug XII: midline tongue extension Motor: Moves all extremities weakly but symmetrical with no focal weakness appreciated.  Increased tone throughout Sensory: Pinprick and light touch intact throughout, bilaterally Deep Tendon Reflexes: Symmetric throughout Plantars: Right: downgoing   Left: downgoing Cerebellar: Unable to perform Gait: not tested due to safety concerns      Laboratory Studies:   Basic Metabolic Panel: Recent Labs  Lab 08/25/24 0518 08/26/24 0832 08/27/24 0530 08/29/24 0434 08/30/24 1045  NA 138 139 139 146* 141  K 3.8 3.7 4.0 4.0 3.9  CL 105 106 104 108 105  CO2 23 23 22 22 22   GLUCOSE 103* 109* 122* 118* 150*  BUN 20 16 15 16 20   CREATININE 0.60 0.62 0.68 0.77 0.74  CALCIUM 8.7* 8.9 9.4 9.3 9.6    Liver Function Tests: Recent Labs  Lab 08/29/24 0434 08/30/24 1045  AST 71* 64*  ALT 71* 73*  ALKPHOS 109 112  BILITOT 0.7 1.0  PROT 7.1 7.5  ALBUMIN 3.5 3.5   No results for input(s): LIPASE, AMYLASE in the last 168 hours. Recent Labs  Lab 08/29/24 0434  AMMONIA 25    CBC: Recent Labs  Lab 08/24/24 0438 08/25/24 0518 08/26/24 0832 08/27/24 0530  08/29/24 0434  WBC 7.2 7.3 7.5 8.7 9.7  NEUTROABS 4.1 4.3 5.0 5.9  --   HGB 14.1 14.3 14.2 15.0 14.7  HCT 42.5 43.0 42.7 46.9* 44.9  MCV 90.4 90.3 89.7 92.5 90.9  PLT 190 210 229 289 353    Cardiac Enzymes: No results for input(s): CKTOTAL, CKMB, CKMBINDEX, TROPONINI in the last 168 hours.  BNP: Invalid input(s): POCBNP  CBG: Recent Labs  Lab 08/27/24 2307  GLUCAP 112*    Microbiology: Results for orders placed or performed during the hospital encounter of 08/15/24  Urine Culture (for pregnant, neutropenic or urologic patients or patients with an indwelling urinary catheter)     Status: Abnormal   Collection Time: 08/16/24  5:07 AM   Specimen: Urine, Clean Catch  Result Value Ref Range Status   Specimen Description   Final    URINE,  CLEAN CATCH Performed at [facility], [address] [place], [place] [address]    Special Requests   Final    NONE Performed at [facility], [address] [place], [place] [address]    Culture (A)  Final    30,000 COLONIES/mL PROTEUS MIRABILIS >=100,000 COLONIES/mL ESCHERICHIA COLI    Report Status 08/20/2024 FINAL  Final   Organism ID, Bacteria PROTEUS MIRABILIS (A)  Final   Organism ID, Bacteria ESCHERICHIA COLI (A)  Final      Susceptibility   Escherichia coli - MIC*    AMPICILLIN >=32 RESISTANT Resistant     CEFAZOLIN  (URINE) Value in next row Sensitive      2 SENSITIVEThis is a modified FDA-approved test that has been validated and its performance characteristics determined by the reporting laboratory.  This laboratory is certified under the Clinical Laboratory Improvement Amendments CLIA as qualified to perform high complexity clinical laboratory testing.    CEFEPIME Value in next row Sensitive      2 SENSITIVEThis is a modified FDA-approved test that has been validated and its performance characteristics determined by the reporting laboratory.  This laboratory is certified under the Clinical  Laboratory Improvement Amendments CLIA as qualified to perform high complexity clinical laboratory testing.    ERTAPENEM Value in next row Sensitive      2 SENSITIVEThis is a modified FDA-approved test that has been validated and its performance characteristics determined by the reporting laboratory.  This laboratory is certified under the Clinical Laboratory Improvement Amendments CLIA as qualified to perform high complexity clinical laboratory testing.    CEFTRIAXONE  Value in next row Sensitive      2 SENSITIVEThis is a modified FDA-approved test that has been validated and its performance characteristics determined by the reporting laboratory.  This laboratory is certified under the Clinical Laboratory Improvement Amendments CLIA as qualified to perform high complexity clinical laboratory testing.    CIPROFLOXACIN Value in next row Sensitive      2 SENSITIVEThis is a modified FDA-approved test that has been validated and its performance characteristics determined by the reporting laboratory.  This laboratory is certified under the Clinical Laboratory Improvement Amendments CLIA as qualified to perform high complexity clinical laboratory testing.    GENTAMICIN Value in next row Sensitive      2 SENSITIVEThis is a modified FDA-approved test that has been validated and its performance characteristics determined by the reporting laboratory.  This laboratory is certified under the Clinical Laboratory Improvement Amendments CLIA as qualified to perform high complexity clinical laboratory testing.    NITROFURANTOIN Value in next row Sensitive      2 SENSITIVEThis is a modified FDA-approved test that has been validated and its performance characteristics determined by the reporting laboratory.  This laboratory is certified under the Clinical Laboratory Improvement Amendments CLIA as qualified to perform high complexity clinical laboratory testing.    TRIMETH/SULFA Value in next row Sensitive      2  SENSITIVEThis is a modified FDA-approved test that has been validated and its performance characteristics determined by the reporting laboratory.  This laboratory is certified under the Clinical Laboratory Improvement Amendments CLIA as qualified to perform high complexity clinical laboratory testing.    AMPICILLIN/SULBACTAM Value in next row Sensitive      2 SENSITIVEThis is a modified FDA-approved test that has been validated and its performance characteristics determined by the reporting laboratory.  This laboratory is certified under the Clinical Laboratory Improvement Amendments CLIA as qualified to perform high complexity  clinical laboratory testing.    PIP/TAZO Value in next row Sensitive      <=4 SENSITIVEThis is a modified FDA-approved test that has been validated and its performance characteristics determined by the reporting laboratory.  This laboratory is certified under the Clinical Laboratory Improvement Amendments CLIA as qualified to perform high complexity clinical laboratory testing.    MEROPENEM Value in next row Sensitive      <=4 SENSITIVEThis is a modified FDA-approved test that has been validated and its performance characteristics determined by the reporting laboratory.  This laboratory is certified under the Clinical Laboratory Improvement Amendments CLIA as qualified to perform high complexity clinical laboratory testing.    * >=100,000 COLONIES/mL ESCHERICHIA COLI   Proteus mirabilis - MIC*    AMPICILLIN Value in next row Sensitive      <=4 SENSITIVEThis is a modified FDA-approved test that has been validated and its performance characteristics determined by the reporting laboratory.  This laboratory is certified under the Clinical Laboratory Improvement Amendments CLIA as qualified to perform high complexity clinical laboratory testing.    CEFAZOLIN  (URINE) Value in next row Sensitive      8 SENSITIVEThis is a modified FDA-approved test that has been validated and its  performance characteristics determined by the reporting laboratory.  This laboratory is certified under the Clinical Laboratory Improvement Amendments CLIA as qualified to perform high complexity clinical laboratory testing.    CEFEPIME Value in next row Sensitive      8 SENSITIVEThis is a modified FDA-approved test that has been validated and its performance characteristics determined by the reporting laboratory.  This laboratory is certified under the Clinical Laboratory Improvement Amendments CLIA as qualified to perform high complexity clinical laboratory testing.    ERTAPENEM Value in next row Sensitive      8 SENSITIVEThis is a modified FDA-approved test that has been validated and its performance characteristics determined by the reporting laboratory.  This laboratory is certified under the Clinical Laboratory Improvement Amendments CLIA as qualified to perform high complexity clinical laboratory testing.    CEFTRIAXONE  Value in next row Sensitive      8 SENSITIVEThis is a modified FDA-approved test that has been validated and its performance characteristics determined by the reporting laboratory.  This laboratory is certified under the Clinical Laboratory Improvement Amendments CLIA as qualified to perform high complexity clinical laboratory testing.    CIPROFLOXACIN Value in next row Sensitive      8 SENSITIVEThis is a modified FDA-approved test that has been validated and its performance characteristics determined by the reporting laboratory.  This laboratory is certified under the Clinical Laboratory Improvement Amendments CLIA as qualified to perform high complexity clinical laboratory testing.    GENTAMICIN Value in next row Sensitive      8 SENSITIVEThis is a modified FDA-approved test that has been validated and its performance characteristics determined by the reporting laboratory.  This laboratory is certified under the Clinical Laboratory Improvement Amendments CLIA as qualified to  perform high complexity clinical laboratory testing.    NITROFURANTOIN Value in next row Resistant      8 SENSITIVEThis is a modified FDA-approved test that has been validated and its performance characteristics determined by the reporting laboratory.  This laboratory is certified under the Clinical Laboratory Improvement Amendments CLIA as qualified to perform high complexity clinical laboratory testing.    TRIMETH/SULFA Value in next row Sensitive      8 SENSITIVEThis is a modified FDA-approved test that has been validated and its performance characteristics determined  by the reporting laboratory.  This laboratory is certified under the Clinical Laboratory Improvement Amendments CLIA as qualified to perform high complexity clinical laboratory testing.    AMPICILLIN/SULBACTAM Value in next row Sensitive      8 SENSITIVEThis is a modified FDA-approved test that has been validated and its performance characteristics determined by the reporting laboratory.  This laboratory is certified under the Clinical Laboratory Improvement Amendments CLIA as qualified to perform high complexity clinical laboratory testing.    PIP/TAZO Value in next row Sensitive      <=4 SENSITIVEThis is a modified FDA-approved test that has been validated and its performance characteristics determined by the reporting laboratory.  This laboratory is certified under the Clinical Laboratory Improvement Amendments CLIA as qualified to perform high complexity clinical laboratory testing.    MEROPENEM Value in next row Sensitive      <=4 SENSITIVEThis is a modified FDA-approved test that has been validated and its performance characteristics determined by the reporting laboratory.  This laboratory is certified under the Clinical Laboratory Improvement Amendments CLIA as qualified to perform high complexity clinical laboratory testing.    * 30,000 COLONIES/mL PROTEUS MIRABILIS  Resp panel by RT-PCR (RSV, Flu A&B, Covid) Anterior Nasal Swab      Status: None   Collection Time: 08/20/24  5:34 PM   Specimen: Anterior Nasal Swab  Result Value Ref Range Status   SARS Coronavirus 2 by RT PCR NEGATIVE NEGATIVE Final    Comment: (NOTE) SARS-CoV-2 target nucleic acids are NOT DETECTED.  The SARS-CoV-2 RNA is generally detectable in upper respiratory specimens during the acute phase of infection. The lowest concentration of SARS-CoV-2 viral copies this assay can detect is 138 copies/mL. A negative result does not preclude SARS-Cov-2 infection and should not be used as the sole basis for treatment or other patient management decisions. A negative result may occur with  improper specimen collection/handling, submission of specimen other than nasopharyngeal swab, presence of viral mutation(s) within the areas targeted by this assay, and inadequate number of viral copies(<138 copies/mL). A negative result must be combined with clinical observations, patient history, and epidemiological information. The expected result is Negative.  Fact Sheet for Patients:  bloggercourse.com  Fact Sheet for Healthcare Providers:  seriousbroker.it  This test is no t yet approved or cleared by the [place]  FDA and  has been authorized for detection and/or diagnosis of SARS-CoV-2 by FDA under an Emergency Use Authorization (EUA). This EUA will remain  in effect (meaning this test can be used) for the duration of the COVID-19 declaration under Section 564(b)(1) of the Act, 21 U.S.C.section 360bbb-3(b)(1), unless the authorization is terminated  or revoked sooner.       Influenza A by PCR NEGATIVE NEGATIVE Final   Influenza B by PCR NEGATIVE NEGATIVE Final    Comment: (NOTE) The Xpert Xpress SARS-CoV-2/FLU/RSV plus assay is intended as an aid in the diagnosis of influenza from Nasopharyngeal swab specimens and should not be used as a sole basis for treatment. Nasal washings and aspirates are  unacceptable for Xpert Xpress SARS-CoV-2/FLU/RSV testing.  Fact Sheet for Patients: bloggercourse.com  Fact Sheet for Healthcare Providers: seriousbroker.it  This test is not yet approved or cleared by the [place]  FDA and has been authorized for detection and/or diagnosis of SARS-CoV-2 by FDA under an Emergency Use Authorization (EUA). This EUA will remain in effect (meaning this test can be used) for the duration of the COVID-19 declaration under Section 564(b)(1) of the Act, 21 U.S.C. section 360bbb-3(b)(1),  unless the authorization is terminated or revoked.     Resp Syncytial Virus by PCR NEGATIVE NEGATIVE Final    Comment: (NOTE) Fact Sheet for Patients: bloggercourse.com  Fact Sheet for Healthcare Providers: seriousbroker.it  This test is not yet approved or cleared by the [place]  FDA and has been authorized for detection and/or diagnosis of SARS-CoV-2 by FDA under an Emergency Use Authorization (EUA). This EUA will remain in effect (meaning this test can be used) for the duration of the COVID-19 declaration under Section 564(b)(1) of the Act, 21 U.S.C. section 360bbb-3(b)(1), unless the authorization is terminated or revoked.  Performed at [facility], [address] [place], [place] [address]   Respiratory (~20 pathogens) panel by PCR     Status: Abnormal   Collection Time: 08/21/24  8:23 AM   Specimen: Nasopharyngeal Swab; Respiratory  Result Value Ref Range Status   Adenovirus NOT DETECTED NOT DETECTED Final   Coronavirus 229E NOT DETECTED NOT DETECTED Final    Comment: (NOTE) The Coronavirus on the Respiratory Panel, DOES NOT test for the novel  Coronavirus (2019 nCoV)    Coronavirus HKU1 NOT DETECTED NOT DETECTED Final   Coronavirus NL63 NOT DETECTED NOT DETECTED Final   Coronavirus OC43 NOT DETECTED NOT DETECTED Final   Metapneumovirus  NOT DETECTED NOT DETECTED Final   Rhinovirus / Enterovirus NOT DETECTED NOT DETECTED Final   Influenza A NOT DETECTED NOT DETECTED Final   Influenza B NOT DETECTED NOT DETECTED Final   Parainfluenza Virus 1 DETECTED (A) NOT DETECTED Final   Parainfluenza Virus 2 NOT DETECTED NOT DETECTED Final   Parainfluenza Virus 3 NOT DETECTED NOT DETECTED Final   Parainfluenza Virus 4 NOT DETECTED NOT DETECTED Final   Respiratory Syncytial Virus NOT DETECTED NOT DETECTED Final   Bordetella pertussis NOT DETECTED NOT DETECTED Final   Bordetella Parapertussis NOT DETECTED NOT DETECTED Final   Chlamydophila pneumoniae NOT DETECTED NOT DETECTED Final   Mycoplasma pneumoniae NOT DETECTED NOT DETECTED Final    Comment: Performed at [facility], [address] [place], [place] [address]  MRSA Next Gen by PCR, Nasal     Status: Abnormal   Collection Time: 08/21/24  8:23 AM   Specimen: Nasal Mucosa; Nasal Swab  Result Value Ref Range Status   MRSA by PCR Next Gen DETECTED (A) NOT DETECTED Final    Comment: RESULT CALLED TO, READ BACK BY AND VERIFIED WITH: ALLIE T. RN 1032 08/21/24 HNM (NOTE) The GeneXpert MRSA Assay (FDA approved for NASAL specimens only), is one component of a comprehensive MRSA colonization surveillance program. It is not intended to diagnose MRSA infection nor to guide or monitor treatment for MRSA infections. Test performance is not FDA approved in patients less than 71 years old. Performed at [facility], [address] [place], [place] [address]   Culture, blood (Routine X 2) w Reflex to ID Panel     Status: None (Preliminary result)   Collection Time: 08/28/24  1:40 PM   Specimen: BLOOD  Result Value Ref Range Status   Specimen Description BLOOD BLOOD RIGHT HAND  Final   Special Requests   Final    BOTTLES DRAWN AEROBIC AND ANAEROBIC Blood Culture adequate volume   Culture   Final    NO GROWTH 2 DAYS Performed at [facility], [address], [place], [place] [address]    Report Status PENDING  Incomplete  Culture, blood (Routine X 2) w Reflex to ID Panel  Status: None (Preliminary result)   Collection Time: 08/28/24  1:40 PM   Specimen: BLOOD  Result Value Ref Range Status   Specimen Description BLOOD BLOOD LEFT HAND  Final   Special Requests   Final    BOTTLES DRAWN AEROBIC AND ANAEROBIC Blood Culture adequate volume   Culture   Final    NO GROWTH 2 DAYS Performed at [facility], [address] [place], [place] [address]    Report Status PENDING  Incomplete    Coagulation Studies: No results for input(s): LABPROT, INR in the last 72 hours.  Urinalysis:  Recent Labs  Lab 08/29/24 1815  COLORURINE AMBER*  LABSPEC 1.024  PHURINE 5.0  GLUCOSEU NEGATIVE  HGBUR NEGATIVE  BILIRUBINUR NEGATIVE  KETONESUR NEGATIVE  PROTEINUR NEGATIVE  NITRITE NEGATIVE  LEUKOCYTESUR NEGATIVE    Lipid Panel:     Component Value Date/Time   CHOL 149 12/16/2017 0813   TRIG 54 12/16/2017 0813   HDL 75 12/16/2017 0813   CHOLHDL 2.0 12/16/2017 0813   LDLCALC 63 12/16/2017 0813    HgbA1C:  Lab Results  Component Value Date   HGBA1C 5.3 02/24/2021    Urine Drug Screen:      Component Value Date/Time   LABOPIA NEGATIVE 08/15/2024 2318   COCAINSCRNUR NEGATIVE 08/15/2024 2318   COCAINSCRNUR NONE DETECTED 02/24/2021 0445   LABBENZ NEGATIVE 08/15/2024 2318   AMPHETMU NEGATIVE 08/15/2024 2318   THCU NEGATIVE 08/15/2024 2318   LABBARB NEGATIVE 08/15/2024 2318    Alcohol Level: No results for input(s): ETH in the last 168 hours.  Other results: EKG: NSR at 91bpm.  Imaging: MR BRAIN WO CONTRAST Result Date: 08/30/2024 EXAM: MRI BRAIN WITHOUT CONTRAST 08/29/2024 11:57:09 PM TECHNIQUE: Multiplanar multisequence MRI of the head/brain was performed without the administration of intravenous contrast. COMPARISON: CT head earlier today. CLINICAL HISTORY: Altered mental status. FINDINGS: BRAIN AND VENTRICLES: No  acute infarct. No intracranial hemorrhage. No mass. No midline shift. No hydrocephalus. The sella is unremarkable. Normal flow voids. ORBITS: No acute abnormality. SINUSES AND MASTOIDS: No acute abnormality. BONES AND SOFT TISSUES: Normal marrow signal. No acute soft tissue abnormality. IMPRESSION: 1. No acute intracranial abnormality. Electronically signed by: Gilmore Molt MD 08/30/2024 02:11 AM EST RP Workstation: [id number]   DG Chest Port 1 View Result Date: 08/29/2024 EXAM: 1 VIEW XRAY OF THE CHEST 08/29/2024 08:44:53 AM COMPARISON: None available. CLINICAL HISTORY: Dyspnea. FINDINGS: LUNGS AND PLEURA: Mildly low lung volumes. No focal pulmonary opacity. No pleural effusion. No pneumothorax. HEART AND MEDIASTINUM: Unchanged cardiomegaly. BONES AND SOFT TISSUES: No acute osseous abnormality. IMPRESSION: 1. No acute cardiopulmonary process identified. 2. Cardiomegaly, unchanged. Electronically signed by: Manford Cummins MD 08/29/2024 01:23 PM EST RP Workstation: [id number]   CT HEAD WO CONTRAST ( ) Result Date: 08/29/2024 EXAM: CT HEAD WITHOUT CONTRAST 08/29/2024 08:34:51 AM TECHNIQUE: CT of the head was performed without the administration of intravenous contrast. Automated exposure control, iterative reconstruction, and/or weight based adjustment of the mA/kV was utilized to reduce the radiation dose to as low as reasonably achievable. COMPARISON: CT head 08/15/2024. CLINICAL HISTORY: Mental status change, unknown cause. FINDINGS: BRAIN AND VENTRICLES: No acute hemorrhage. No evidence of acute infarct. No hydrocephalus. No extra-axial collection. No mass effect or midline shift. Overall similar mild scattered white matter hypodensities which are nonspecific but most commonly represent chronic microvascular ischemic changes. There is diffusely hyperattenuating appearance of the intracranial vasculature in particular, the left MCA, which may relate to elevated hematocrit. ORBITS: No acute abnormality.  SINUSES: No acute abnormality.  SOFT TISSUES AND SKULL: No acute soft tissue abnormality. No skull fracture. IMPRESSION: 1. No acute intracranial hemorrhage or mass effect. 2. Hyperattenuating intracranial vasculature, particularly the left MCA, which may reflect elevated hematocrit. If there is concern for recent ischemic infarction, recommend CTA Head and Neck and/or MRI Brain. Electronically signed by: prentice spade 08/29/2024 09:02 AM EST RP Workstation: [id number]     Assessment/Plan:  68 y.o. female who due to mental status is unable to provide any history therefore all history obtained from the chart.   Patient with medical history significant of alcohol use disorder, dCHF, CAD, depression with anxiety, morbid obesity, ulcerative colitis, NSVT, PSVT, varicose vein in both legs, carotid artery stenosis, morbid obesity, who presented initially with left leg swelling and pain.  Developed confusion in the ED.  Initially was not febrile but developed fevers while hospitalized.  Was found to have a UTI and there was infection of the left leg as well.  There was question of PNA but this has been ruled out.  Positive for Parainfluenza virus.   Placed on CIWA protocol on admission.  Has been on Zithromycin, Ceftriaxone , Vancomycin  during her stay.  Currently afrebile and with normal wbc count.  Altered mental status has continued although somewhat better today.   Etiology of altered mental status likely multifactorial and related to infection and ETOH withdrawal.  At this point the withdrawal may be playing the biggest part.  There are a percentage of patients that go through ETOH withdrawal and never return to baseline.  At this point very low likelihood that the patient has a partially treated meningitis but if patient begins to deteriorate will consider the need for an LP.  Patient may benefit from high dose thiamine  supplementation and would give a trial while checking thyroid and other vitamin levels.   MRI of the brain performed and personally reviewed.  MRI unremarkable.    Thiamine  500mg  IV q 8 hours for 3-5 days.  Will re-evaluate to determine efficacy.    TSH, B12, B1, folate Will continue to follow with you  Case discussed with Dr. Trudy Sonny Hock, MD Neurology  08/30/2024, 3:37 PM

## 2024-08-30 NOTE — TOC Progression Note (Signed)
 Transition of Care [facility]) - Progression Note    Patient Details  Name: Haley Deleon MRN: [id number] Date of Birth: Sep 29, 1955  [facility] CM/SW Contact  Corean ONEIDA Haddock, RN Phone Number: 08/30/2024, 10:55 AM  Clinical Narrative:        Per MD patient not medically appropriate for auth to be initiated.  Neurology consult pending                 Expected Discharge Plan and Services                                               Social Drivers of Health (SDOH) Interventions SDOH Screenings   Food Insecurity: No Food Insecurity (08/21/2024)  Housing: Unknown (08/25/2024)  Transportation Needs: No Transportation Needs (08/21/2024)  Utilities: Not At Risk (08/21/2024)  Social Connections: Moderately Isolated (08/22/2024)  Tobacco Use: Medium Risk (08/15/2024)    Readmission Risk Interventions     No data to display

## 2024-08-31 ENCOUNTER — Inpatient Hospital Stay

## 2024-08-31 DIAGNOSIS — J122 Parainfluenza virus pneumonia: Secondary | ICD-10-CM | POA: Diagnosis not present

## 2024-08-31 DIAGNOSIS — R4182 Altered mental status, unspecified: Secondary | ICD-10-CM | POA: Diagnosis not present

## 2024-08-31 DIAGNOSIS — R509 Fever, unspecified: Secondary | ICD-10-CM | POA: Diagnosis not present

## 2024-08-31 LAB — SEDIMENTATION RATE: Sed Rate: 38 mm/h — ABNORMAL HIGH (ref 0–30)

## 2024-08-31 LAB — C-REACTIVE PROTEIN: CRP: 10.5 mg/dL — ABNORMAL HIGH (ref <1.0)

## 2024-08-31 MED ORDER — PIPERACILLIN-TAZOBACTAM 3.375 G IVPB
3.3750 g | Freq: Three times a day (TID) | INTRAVENOUS | Status: DC
  Administered 2024-08-31 – 2024-09-05 (×15): 3.375 g via INTRAVENOUS
  Filled 2024-08-31 (×15): qty 50

## 2024-08-31 MED ORDER — DEXTROSE-SODIUM CHLORIDE 5-0.45 % IV SOLN: INTRAVENOUS | Status: AC

## 2024-08-31 MED ORDER — ENOXAPARIN SODIUM 60 MG/0.6ML IJ SOSY
60.0000 mg | PREFILLED_SYRINGE | INTRAMUSCULAR | Status: DC
  Filled 2024-08-31: qty 0.6

## 2024-08-31 MED ORDER — LIDOCAINE HCL (PF) 1 % IJ SOLN
10.0000 mL | Freq: Once | INTRAMUSCULAR | Status: AC
  Administered 2024-08-31: 10 mL

## 2024-08-31 MED ORDER — IOHEXOL 300 MG/ML  SOLN
100.0000 mL | Freq: Once | INTRAMUSCULAR | Status: AC | PRN
  Administered 2024-08-31: 100 mL via INTRAVENOUS

## 2024-08-31 NOTE — Plan of Care (Signed)

## 2024-08-31 NOTE — Progress Notes (Signed)
 INFECTIOUS DISEASE PROGRESS NOTE Date of Admission:  08/15/2024     ID: Haley Deleon is a 68 y.o. female with  fever, ams Principal Problem:   Acute metabolic encephalopathy Active Problems:   Ulcerative colitis (HCC)   Alcohol withdrawal (HCC)   Chronic diastolic CHF (congestive heart failure) (HCC)   Intertriginous candidiasis   Depression   Pain and swelling of left lower leg   Obesity, Class III, BMI 40-49.9 (morbid obesity) (HCC)   UTI (urinary tract infection)   Subjective: Febrile yest but not today. Much more alert today but still confused. Coughing on exam  ROS  Unable to obtain   Medications:  Antibiotics Given (last 72 hours)     None       enoxaparin  (LOVENOX ) injection  60 mg Subcutaneous Q24H   escitalopram   10 mg Oral Daily   feeding supplement  237 mL Oral BID BM   folic acid   1 mg Oral Daily   multivitamin with minerals  1 tablet Oral Daily   nystatin    Topical BID   mouth rinse  15 mL Mouth Rinse 4 times per day    Objective: Vital signs in last 24 hours: Temp:  [99.2 F (37.3 C)-100.8 F (38.2 C)] 99.9 F (37.7 C) (12/11 0732) Pulse Rate:  [49-100] 49 (12/11 0732) Resp:  [16-18] 18 (12/11 0732) BP: (102-128)/(70-97) 118/83 (12/11 0732) SpO2:  [95 %-97 %] 97 % (12/11 0732) Weight:  [111.7 kg] 111.7 kg (12/11 0424) Constitutional:  opens eyes and verbalizes some. HENT: [place]/AT, PERRLA, no scleral icterus Mouth/Throat: Oropharynx is dry Neck supple Cardiovascular: Normal rate, regular rhythm and normal heart sounds.  Pulmonary/Chest:  Poor air movement  Neck = diff to assess neck movemetn Abdominal: Soft. Bowel sounds are normal.  exhibits no distension.  Lymphadenopathy: no cervical adenopathy. No axillary adenopathy Neurological:  more interactive Skin: some abrasions and bruising.   Lab Results Recent Labs    08/29/24 0434 08/30/24 1045  WBC 9.7  --   HGB 14.7  --   HCT 44.9  --   NA 146* 141  K 4.0 3.9  CL 108 105  CO2 22  22  BUN 16 20  CREATININE 0.77 0.74    Microbiology: Results for orders placed or performed during the hospital encounter of 08/15/24  Urine Culture (for pregnant, neutropenic or urologic patients or patients with an indwelling urinary catheter)     Status: Abnormal   Collection Time: 08/16/24  5:07 AM   Specimen: Urine, Clean Catch  Result Value Ref Range Status   Specimen Description   Final    URINE, CLEAN CATCH Performed at [facility], [address] [place], [place] [address]    Special Requests   Final    NONE Performed at [facility], [address] [place], [place] [address]    Culture (A)  Final    30,000 COLONIES/mL PROTEUS MIRABILIS >=100,000 COLONIES/mL ESCHERICHIA COLI    Report Status 08/20/2024 FINAL  Final   Organism ID, Bacteria PROTEUS MIRABILIS (A)  Final   Organism ID, Bacteria ESCHERICHIA COLI (A)  Final      Susceptibility   Escherichia coli - MIC*    AMPICILLIN >=32 RESISTANT Resistant     CEFAZOLIN  (URINE) Value in next row Sensitive      2 SENSITIVEThis is a modified FDA-approved test that has been validated and its performance characteristics determined by the reporting laboratory.  This laboratory is certified under the Clinical Laboratory Improvement Amendments  CLIA as qualified to perform high complexity clinical laboratory testing.    CEFEPIME Value in next row Sensitive      2 SENSITIVEThis is a modified FDA-approved test that has been validated and its performance characteristics determined by the reporting laboratory.  This laboratory is certified under the Clinical Laboratory Improvement Amendments CLIA as qualified to perform high complexity clinical laboratory testing.    ERTAPENEM Value in next row Sensitive      2 SENSITIVEThis is a modified FDA-approved test that has been validated and its performance characteristics determined by the reporting laboratory.  This laboratory is certified under the Clinical Laboratory  Improvement Amendments CLIA as qualified to perform high complexity clinical laboratory testing.    CEFTRIAXONE  Value in next row Sensitive      2 SENSITIVEThis is a modified FDA-approved test that has been validated and its performance characteristics determined by the reporting laboratory.  This laboratory is certified under the Clinical Laboratory Improvement Amendments CLIA as qualified to perform high complexity clinical laboratory testing.    CIPROFLOXACIN Value in next row Sensitive      2 SENSITIVEThis is a modified FDA-approved test that has been validated and its performance characteristics determined by the reporting laboratory.  This laboratory is certified under the Clinical Laboratory Improvement Amendments CLIA as qualified to perform high complexity clinical laboratory testing.    GENTAMICIN Value in next row Sensitive      2 SENSITIVEThis is a modified FDA-approved test that has been validated and its performance characteristics determined by the reporting laboratory.  This laboratory is certified under the Clinical Laboratory Improvement Amendments CLIA as qualified to perform high complexity clinical laboratory testing.    NITROFURANTOIN Value in next row Sensitive      2 SENSITIVEThis is a modified FDA-approved test that has been validated and its performance characteristics determined by the reporting laboratory.  This laboratory is certified under the Clinical Laboratory Improvement Amendments CLIA as qualified to perform high complexity clinical laboratory testing.    TRIMETH/SULFA Value in next row Sensitive      2 SENSITIVEThis is a modified FDA-approved test that has been validated and its performance characteristics determined by the reporting laboratory.  This laboratory is certified under the Clinical Laboratory Improvement Amendments CLIA as qualified to perform high complexity clinical laboratory testing.    AMPICILLIN/SULBACTAM Value in next row Sensitive      2  SENSITIVEThis is a modified FDA-approved test that has been validated and its performance characteristics determined by the reporting laboratory.  This laboratory is certified under the Clinical Laboratory Improvement Amendments CLIA as qualified to perform high complexity clinical laboratory testing.    PIP/TAZO Value in next row Sensitive      <=4 SENSITIVEThis is a modified FDA-approved test that has been validated and its performance characteristics determined by the reporting laboratory.  This laboratory is certified under the Clinical Laboratory Improvement Amendments CLIA as qualified to perform high complexity clinical laboratory testing.    MEROPENEM Value in next row Sensitive      <=4 SENSITIVEThis is a modified FDA-approved test that has been validated and its performance characteristics determined by the reporting laboratory.  This laboratory is certified under the Clinical Laboratory Improvement Amendments CLIA as qualified to perform high complexity clinical laboratory testing.    * >=100,000 COLONIES/mL ESCHERICHIA COLI   Proteus mirabilis - MIC*    AMPICILLIN Value in next row Sensitive      <=4 SENSITIVEThis is a modified FDA-approved test that has been  validated and its performance characteristics determined by the reporting laboratory.  This laboratory is certified under the Clinical Laboratory Improvement Amendments CLIA as qualified to perform high complexity clinical laboratory testing.    CEFAZOLIN  (URINE) Value in next row Sensitive      8 SENSITIVEThis is a modified FDA-approved test that has been validated and its performance characteristics determined by the reporting laboratory.  This laboratory is certified under the Clinical Laboratory Improvement Amendments CLIA as qualified to perform high complexity clinical laboratory testing.    CEFEPIME Value in next row Sensitive      8 SENSITIVEThis is a modified FDA-approved test that has been validated and its performance  characteristics determined by the reporting laboratory.  This laboratory is certified under the Clinical Laboratory Improvement Amendments CLIA as qualified to perform high complexity clinical laboratory testing.    ERTAPENEM Value in next row Sensitive      8 SENSITIVEThis is a modified FDA-approved test that has been validated and its performance characteristics determined by the reporting laboratory.  This laboratory is certified under the Clinical Laboratory Improvement Amendments CLIA as qualified to perform high complexity clinical laboratory testing.    CEFTRIAXONE  Value in next row Sensitive      8 SENSITIVEThis is a modified FDA-approved test that has been validated and its performance characteristics determined by the reporting laboratory.  This laboratory is certified under the Clinical Laboratory Improvement Amendments CLIA as qualified to perform high complexity clinical laboratory testing.    CIPROFLOXACIN Value in next row Sensitive      8 SENSITIVEThis is a modified FDA-approved test that has been validated and its performance characteristics determined by the reporting laboratory.  This laboratory is certified under the Clinical Laboratory Improvement Amendments CLIA as qualified to perform high complexity clinical laboratory testing.    GENTAMICIN Value in next row Sensitive      8 SENSITIVEThis is a modified FDA-approved test that has been validated and its performance characteristics determined by the reporting laboratory.  This laboratory is certified under the Clinical Laboratory Improvement Amendments CLIA as qualified to perform high complexity clinical laboratory testing.    NITROFURANTOIN Value in next row Resistant      8 SENSITIVEThis is a modified FDA-approved test that has been validated and its performance characteristics determined by the reporting laboratory.  This laboratory is certified under the Clinical Laboratory Improvement Amendments CLIA as qualified to perform high  complexity clinical laboratory testing.    TRIMETH/SULFA Value in next row Sensitive      8 SENSITIVEThis is a modified FDA-approved test that has been validated and its performance characteristics determined by the reporting laboratory.  This laboratory is certified under the Clinical Laboratory Improvement Amendments CLIA as qualified to perform high complexity clinical laboratory testing.    AMPICILLIN/SULBACTAM Value in next row Sensitive      8 SENSITIVEThis is a modified FDA-approved test that has been validated and its performance characteristics determined by the reporting laboratory.  This laboratory is certified under the Clinical Laboratory Improvement Amendments CLIA as qualified to perform high complexity clinical laboratory testing.    PIP/TAZO Value in next row Sensitive      <=4 SENSITIVEThis is a modified FDA-approved test that has been validated and its performance characteristics determined by the reporting laboratory.  This laboratory is certified under the Clinical Laboratory Improvement Amendments CLIA as qualified to perform high complexity clinical laboratory testing.    MEROPENEM Value in next row Sensitive      <=4 SENSITIVEThis  is a modified FDA-approved test that has been validated and its performance characteristics determined by the reporting laboratory.  This laboratory is certified under the Clinical Laboratory Improvement Amendments CLIA as qualified to perform high complexity clinical laboratory testing.    * 30,000 COLONIES/mL PROTEUS MIRABILIS  Resp panel by RT-PCR (RSV, Flu A&B, Covid) Anterior Nasal Swab     Status: None   Collection Time: 08/20/24  5:34 PM   Specimen: Anterior Nasal Swab  Result Value Ref Range Status   SARS Coronavirus 2 by RT PCR NEGATIVE NEGATIVE Final    Comment: (NOTE) SARS-CoV-2 target nucleic acids are NOT DETECTED.  The SARS-CoV-2 RNA is generally detectable in upper respiratory specimens during the acute phase of infection. The  lowest concentration of SARS-CoV-2 viral copies this assay can detect is 138 copies/mL. A negative result does not preclude SARS-Cov-2 infection and should not be used as the sole basis for treatment or other patient management decisions. A negative result may occur with  improper specimen collection/handling, submission of specimen other than nasopharyngeal swab, presence of viral mutation(s) within the areas targeted by this assay, and inadequate number of viral copies(<138 copies/mL). A negative result must be combined with clinical observations, patient history, and epidemiological information. The expected result is Negative.  Fact Sheet for Patients:  bloggercourse.com  Fact Sheet for Healthcare Providers:  seriousbroker.it  This test is no t yet approved or cleared by the [place]  FDA and  has been authorized for detection and/or diagnosis of SARS-CoV-2 by FDA under an Emergency Use Authorization (EUA). This EUA will remain  in effect (meaning this test can be used) for the duration of the COVID-19 declaration under Section 564(b)(1) of the Act, 21 U.S.C.section 360bbb-3(b)(1), unless the authorization is terminated  or revoked sooner.       Influenza A by PCR NEGATIVE NEGATIVE Final   Influenza B by PCR NEGATIVE NEGATIVE Final    Comment: (NOTE) The Xpert Xpress SARS-CoV-2/FLU/RSV plus assay is intended as an aid in the diagnosis of influenza from Nasopharyngeal swab specimens and should not be used as a sole basis for treatment. Nasal washings and aspirates are unacceptable for Xpert Xpress SARS-CoV-2/FLU/RSV testing.  Fact Sheet for Patients: bloggercourse.com  Fact Sheet for Healthcare Providers: seriousbroker.it  This test is not yet approved or cleared by the [place]  FDA and has been authorized for detection and/or diagnosis of SARS-CoV-2 by FDA under  an Emergency Use Authorization (EUA). This EUA will remain in effect (meaning this test can be used) for the duration of the COVID-19 declaration under Section 564(b)(1) of the Act, 21 U.S.C. section 360bbb-3(b)(1), unless the authorization is terminated or revoked.     Resp Syncytial Virus by PCR NEGATIVE NEGATIVE Final    Comment: (NOTE) Fact Sheet for Patients: bloggercourse.com  Fact Sheet for Healthcare Providers: seriousbroker.it  This test is not yet approved or cleared by the [place]  FDA and has been authorized for detection and/or diagnosis of SARS-CoV-2 by FDA under an Emergency Use Authorization (EUA). This EUA will remain in effect (meaning this test can be used) for the duration of the COVID-19 declaration under Section 564(b)(1) of the Act, 21 U.S.C. section 360bbb-3(b)(1), unless the authorization is terminated or revoked.  Performed at [facility], [address] [place], [place] [address]   Respiratory (~20 pathogens) panel by PCR     Status: Abnormal   Collection Time: 08/21/24  8:23 AM   Specimen: Nasopharyngeal Swab; Respiratory  Result Value Ref Range Status  Adenovirus NOT DETECTED NOT DETECTED Final   Coronavirus 229E NOT DETECTED NOT DETECTED Final    Comment: (NOTE) The Coronavirus on the Respiratory Panel, DOES NOT test for the novel  Coronavirus (2019 nCoV)    Coronavirus HKU1 NOT DETECTED NOT DETECTED Final   Coronavirus NL63 NOT DETECTED NOT DETECTED Final   Coronavirus OC43 NOT DETECTED NOT DETECTED Final   Metapneumovirus NOT DETECTED NOT DETECTED Final   Rhinovirus / Enterovirus NOT DETECTED NOT DETECTED Final   Influenza A NOT DETECTED NOT DETECTED Final   Influenza B NOT DETECTED NOT DETECTED Final   Parainfluenza Virus 1 DETECTED (A) NOT DETECTED Final   Parainfluenza Virus 2 NOT DETECTED NOT DETECTED Final   Parainfluenza Virus 3 NOT DETECTED NOT DETECTED Final    Parainfluenza Virus 4 NOT DETECTED NOT DETECTED Final   Respiratory Syncytial Virus NOT DETECTED NOT DETECTED Final   Bordetella pertussis NOT DETECTED NOT DETECTED Final   Bordetella Parapertussis NOT DETECTED NOT DETECTED Final   Chlamydophila pneumoniae NOT DETECTED NOT DETECTED Final   Mycoplasma pneumoniae NOT DETECTED NOT DETECTED Final    Comment: Performed at [facility], [address] [place], [place] [address]  MRSA Next Gen by PCR, Nasal     Status: Abnormal   Collection Time: 08/21/24  8:23 AM   Specimen: Nasal Mucosa; Nasal Swab  Result Value Ref Range Status   MRSA by PCR Next Gen DETECTED (A) NOT DETECTED Final    Comment: RESULT CALLED TO, READ BACK BY AND VERIFIED WITH: ALLIE T. RN 1032 08/21/24 HNM (NOTE) The GeneXpert MRSA Assay (FDA approved for NASAL specimens only), is one component of a comprehensive MRSA colonization surveillance program. It is not intended to diagnose MRSA infection nor to guide or monitor treatment for MRSA infections. Test performance is not FDA approved in patients less than 51 years old. Performed at [facility], [address] [place], [place] [address]   Culture, blood (Routine X 2) w Reflex to ID Panel     Status: None (Preliminary result)   Collection Time: 08/28/24  1:40 PM   Specimen: BLOOD  Result Value Ref Range Status   Specimen Description BLOOD BLOOD RIGHT HAND  Final   Special Requests   Final    BOTTLES DRAWN AEROBIC AND ANAEROBIC Blood Culture adequate volume   Culture   Final    NO GROWTH 3 DAYS Performed at [facility], [address], [place], [place] [address]    Report Status PENDING  Incomplete  Culture, blood (Routine X 2) w Reflex to ID Panel     Status: None (Preliminary result)   Collection Time: 08/28/24  1:40 PM   Specimen: BLOOD  Result Value Ref Range Status   Specimen Description BLOOD BLOOD LEFT HAND  Final   Special Requests   Final    BOTTLES DRAWN AEROBIC AND  ANAEROBIC Blood Culture adequate volume   Culture   Final    NO GROWTH 3 DAYS Performed at [facility], [address] [place], [place] [address]    Report Status PENDING  Incomplete    Studies/Results: MR BRAIN WO CONTRAST Result Date: 08/30/2024 EXAM: MRI BRAIN WITHOUT CONTRAST 08/29/2024 11:57:09 PM TECHNIQUE: Multiplanar multisequence MRI of the head/brain was performed without the administration of intravenous contrast. COMPARISON: CT head earlier today. CLINICAL HISTORY: Altered mental status. FINDINGS: BRAIN AND VENTRICLES: No acute infarct. No intracranial hemorrhage. No mass. No midline shift. No hydrocephalus. The sella is unremarkable. Normal flow voids.  ORBITS: No acute abnormality. SINUSES AND MASTOIDS: No acute abnormality. BONES AND SOFT TISSUES: Normal marrow signal. No acute soft tissue abnormality. IMPRESSION: 1. No acute intracranial abnormality. Electronically signed by: Gilmore Molt MD 08/30/2024 02:11 AM EST RP Workstation: [id number]    Assessment/Plan: Haley Deleon is a 68 y.o. female with AMS and fevers recurring after initial treatment for UTI and CAP. + parainfluenza on 12/1 Despite fevers wbc and procalc nml. CXR no change CT with chronic changes in head. MRI Pending BP HR stable  Multiple possible etiologies for the fevers including UTI, aspiration, bacteremia, DVT, skin soft tissue infection   12/10 defervescing. Some improvement Mental status. UA not a clean catch but mixed. Some cough today. MRI head neg Neck is supple so doubt meningitis and the waxing and waning mental status would be unusual for bacterial meningitis  12/11 recurrent fevers.  She is sitting up in bed today.  Nurse tired to feed her  but she is pocketing food. Recommendations With recurrent fevers show check labs including CBC blood cultures. CT will be done Will go ahead and start coverage for aspiration pneumonia with Zosyn. Neurology has evaluated patient. Thank you  very much for the consult. Will follow with you.  Alm SHAUNNA Needle   08/31/2024, 2:36 PM

## 2024-08-31 NOTE — Progress Notes (Signed)
 PROGRESS NOTE   HPI was taken from Dr. Hilma: Haley Deleon is a 68 y.o. female with medical history significant of alcohol use disorder, dCHF, CAD, depression with anxiety, morbid obesity, ulcerative colitis, NSVT, PSVT, varicose vein in both legs, carotid artery stenosis, morbid obesity, who presents with left leg swelling and pain, confusion.   Per ED physician, patient initially presented to ED with left leg swelling and pain, and then she developed confusion in ED.  When I saw patient in ED, she is alert, orientated to place and person, not to the time.  She moves all extremities normally.  No facial droop or slurred speech.  She has bilateral lower leg swelling, the left leg is much worse than the right.  She does not have pain in the right leg.  She denies fall or injury.  No fever or chills.  She has a varicose vein in both legs.  Patient denies chest pain, cough, SOB.  No nausea, vomiting, diarrhea or abdominal pain.  No symptoms UTI.  Patient has candidiasis in groin areas. Per her granddaughter at the bedside, patient used to drink liquor almost every day. Recently she drinks less, and last drinking was several days ago.  Patient states that she only drinks once a week recently.   Data reviewed independently and ED Course: pt was found to have WBC 10.1, GFR> 60, UA (cloudy appearance, small amount of leukocyte, rare bacteria, WBC  21-50), ammonia 19, GFR> 60, negative UDS.  Temperature normal, blood pressure 159/99, heart rate of 107, RR 17, oxygen saturation 100% on room air.  CT of head negative for acute intracranial abnormalities.  Left lower extremity venous Doppler negative for DVT.  Patient is admitted to PCU as inpatient.     Haley Deleon  FMW:[id number] DOB: [birth year] DOA: 08/15/2024 PCP: Ricard Tawni KIDD, MD    Assessment & Plan:   Principal Problem:   Acute metabolic encephalopathy Active Problems:   UTI (urinary tract infection)   Alcohol withdrawal (HCC)   Pain  and swelling of left lower leg   Chronic diastolic CHF (congestive heart failure) (HCC)   Ulcerative colitis (HCC)   Intertriginous candidiasis   Depression   Obesity, Class III, BMI 40-49.9 (morbid obesity) (HCC)  Assessment and Plan:  Acute metabolic encephalopathy: etiolgoy unclear. Treated for withdrawal, uti, parainfluenza, left leg infection. Id and neuro following. Non-con mri negative. Today will repeat blood cultures, order ct of c/a/p, order LP, check esr and crp, check ana and rf. Start fluids. May need to consider alternate feeding if doesn't improve.   UTI: urine cx grew proteus and e. coli. Completed abx course.   Parainfluenza virus: continue w/ supportive care   Fever: see above for broadened w/u  Hypernatremia: resolved  Groin candida: continue on nystatin    Unlikely pneumonia: repeat CXR on 08/22/24 shows no acute abnormalities. Previous XR findings likely secondary to atelectasis. Repeat CXR requested per pt's family shows no acute cardiopulmonary abnormalities. CT chest today    Chronic venous stasis: w/ pain and swelling of LLE. US  of LLE was neg for DVT    Chronic diastolic CHF: echo on 02/24/2021 showed EF 60 to 65%. Appears compensated. Monitor I/Os  Depression: severity unknown. Continue on home dose of lexapro    Obesity: BMI 39.3. Complicates overall care & prognosis     DVT prophylaxis: Lovenox  Code Status: full  Family Communication: discussed pt's care w/ pt's granddaughter, [name]  Disposition Plan: to SNF, has bed  Level of care: Med-Surg  Status is: Inpatient Remains inpatient appropriate because: AMS & fevers still     Consultants:  ID Neuro   Procedures:   Antimicrobials:   Subjective: Lethargic, rouses and responds but not intelligibly  Objective: Vitals:   08/30/24 2214 08/31/24 0353 08/31/24 0424 08/31/24 0732  BP: 102/74 116/70  118/83  Pulse: 96 100  (!) 49  Resp: 16 18  18   Temp: (!) 100.8 F (38.2 C) 99.8 F (37.7 C)   99.9 F (37.7 C)  TempSrc: Axillary Axillary    SpO2: 96% 95%  97%  Weight:   111.7 kg   Height:        Intake/Output Summary (Last 24 hours) at 08/31/2024 1247 Last data filed at 08/31/2024 0900 Gross per 24 hour  Intake 129.01 ml  Output 200 ml  Net -70.99 ml    Filed Weights   08/28/24 0500 08/30/24 0500 08/31/24 0424  Weight: 117.3 kg 113.9 kg 111.7 kg    Examination:  General exam: chronically ill appearing Respiratory system: diminished breath sounds b/l  Cardiovascular system: S1/S2+. No rubs or gallops Gastrointestinal system: abd is soft, NT, obese & hypoactive bowel sounds Central nervous system: somnolent but rouses, responds somewhat, upper arms contracted Ext: left leg swelling Psychiatry: calm   Data Reviewed: I have personally reviewed following labs and imaging studies  CBC: Recent Labs  Lab 08/25/24 0518 08/26/24 0832 08/27/24 0530 08/29/24 0434  WBC 7.3 7.5 8.7 9.7  NEUTROABS 4.3 5.0 5.9  --   HGB 14.3 14.2 15.0 14.7  HCT 43.0 42.7 46.9* 44.9  MCV 90.3 89.7 92.5 90.9  PLT 210 229 289 353   Basic Metabolic Panel: Recent Labs  Lab 08/25/24 0518 08/26/24 0832 08/27/24 0530 08/29/24 0434 08/30/24 1045  NA 138 139 139 146* 141  K 3.8 3.7 4.0 4.0 3.9  CL 105 106 104 108 105  CO2 23 23 22 22 22   GLUCOSE 103* 109* 122* 118* 150*  BUN 20 16 15 16 20   CREATININE 0.60 0.62 0.68 0.77 0.74  CALCIUM 8.7* 8.9 9.4 9.3 9.6   GFR: Estimated Creatinine Clearance: 87.9 mL/min (by C-G formula based on SCr of 0.74 mg/dL). Liver Function Tests: Recent Labs  Lab 08/29/24 0434 08/30/24 1045  AST 71* 64*  ALT 71* 73*  ALKPHOS 109 112  BILITOT 0.7 1.0  PROT 7.1 7.5  ALBUMIN 3.5 3.5   No results for input(s): LIPASE, AMYLASE in the last 168 hours. Recent Labs  Lab 08/29/24 0434  AMMONIA 25   Coagulation Profile: No results for input(s): INR, PROTIME in the last 168 hours. Cardiac Enzymes: No results for input(s): CKTOTAL,  CKMB, CKMBINDEX, TROPONINI in the last 168 hours. BNP (last 3 results) Recent Labs    08/15/24 2238  PROBNP 483.0*   HbA1C: No results for input(s): HGBA1C in the last 72 hours. CBG: Recent Labs  Lab 08/27/24 2307  GLUCAP 112*   Lipid Profile: No results for input(s): CHOL, HDL, LDLCALC, TRIG, CHOLHDL, LDLDIRECT in the last 72 hours. Thyroid Function Tests: Recent Labs    08/30/24 1759  TSH 2.430   Anemia Panel: Recent Labs    08/30/24 1759  VITAMINB12 377  FOLATE >20.0   Sepsis Labs: Recent Labs  Lab 08/28/24 1340  PROCALCITON 0.10    Recent Results (from the past 240 hours)  Culture, blood (Routine X 2) w Reflex to ID Panel     Status: None (Preliminary result)   Collection Time: 08/28/24  1:40 PM   Specimen: BLOOD  Result Value Ref Range Status   Specimen Description BLOOD BLOOD RIGHT HAND  Final   Special Requests   Final    BOTTLES DRAWN AEROBIC AND ANAEROBIC Blood Culture adequate volume   Culture   Final    NO GROWTH 3 DAYS Performed at [facility], [address] [place], [place] [address]    Report Status PENDING  Incomplete  Culture, blood (Routine X 2) w Reflex to ID Panel     Status: None (Preliminary result)   Collection Time: 08/28/24  1:40 PM   Specimen: BLOOD  Result Value Ref Range Status   Specimen Description BLOOD BLOOD LEFT HAND  Final   Special Requests   Final    BOTTLES DRAWN AEROBIC AND ANAEROBIC Blood Culture adequate volume   Culture   Final    NO GROWTH 3 DAYS Performed at [facility], [address] [place], [place] [address]    Report Status PENDING  Incomplete         Radiology Studies: MR BRAIN WO CONTRAST Result Date: 08/30/2024 EXAM: MRI BRAIN WITHOUT CONTRAST 08/29/2024 11:57:09 PM TECHNIQUE: Multiplanar multisequence MRI of the head/brain was performed without the administration of intravenous contrast. COMPARISON: CT head earlier today. CLINICAL HISTORY: Altered  mental status. FINDINGS: BRAIN AND VENTRICLES: No acute infarct. No intracranial hemorrhage. No mass. No midline shift. No hydrocephalus. The sella is unremarkable. Normal flow voids. ORBITS: No acute abnormality. SINUSES AND MASTOIDS: No acute abnormality. BONES AND SOFT TISSUES: Normal marrow signal. No acute soft tissue abnormality. IMPRESSION: 1. No acute intracranial abnormality. Electronically signed by: Gilmore Molt MD 08/30/2024 02:11 AM EST RP Workstation: [id number]        Scheduled Meds:  enoxaparin  (LOVENOX ) injection  60 mg Subcutaneous Q24H   escitalopram   10 mg Oral Daily   feeding supplement  237 mL Oral BID BM   folic acid   1 mg Oral Daily   multivitamin with minerals  1 tablet Oral Daily   nystatin    Topical BID   mouth rinse  15 mL Mouth Rinse 4 times per day   Continuous Infusions:  thiamine  (VITAMIN B1) injection 110 mL/hr at 08/31/24 0658      LOS: 16 days       Devaughn KATHEE Ban, MD [facility]   If 7PM-7AM, please contact night-coverage [email or web address] 08/31/2024, 12:47 PM

## 2024-08-31 NOTE — Procedures (Signed)
 PROCEDURE SUMMARY:  Multiple fluoroscopic guided attempts were made at the levels of L5-S1, L4-5, and L3-4 for lumbar puncture by myself and Radiologist Dr. Harrietta Sherry.  Unfortunately, all attempts were unsuccessful.   No immediate complications.  Pt tolerated well.   EBL = <70mL  Please see full dictation in imaging section of Epic for procedure details.  If additional attempts are to be requested, recommend hydrating well for the next 24-48hrs before re-attempting as well as holding patient's Lovenox  and any other anticoagulation for 24hrs.  Electronically Signed: Yazmina Pareja M Toshio Slusher, PA-C 08/31/2024, 4:25 PM

## 2024-08-31 NOTE — Progress Notes (Signed)
 Subjective: Patient remains altered.    Objective: Current vital signs: BP 118/83 (BP Location: Left Arm)   Pulse (!) 49   Temp 99.9 F (37.7 C)   Resp 18   Ht 5' 7 (1.702 m)   Wt 111.7 kg   SpO2 97%   BMI 38.57 kg/m  Vital signs in last 24 hours: Temp:  [99.2 F (37.3 C)-100.8 F (38.2 C)] 99.9 F (37.7 C) (12/11 0732) Pulse Rate:  [49-100] 49 (12/11 0732) Resp:  [16-18] 18 (12/11 0732) BP: (102-128)/(70-97) 118/83 (12/11 0732) SpO2:  [95 %-97 %] 97 % (12/11 0732) Weight:  [111.7 kg] 111.7 kg (12/11 0424)  Intake/Output from previous day: 12/10 0701 - 12/11 0700 In: 129 [IV Piggyback:129] Out: 200 [Urine:200] Intake/Output this shift: No intake/output data recorded. Nutritional status:  Diet Order             DIET DYS 2 Fluid consistency: Thin  Diet effective now                   Neurologic Exam: Mental Status: Lethargic but able to be aroused.  Follows some simple commands.  Speech fluent but dysarthric.  Cranial Nerves: II: Visual fields grossly normal III,IV, VI: ptosis not present, extra-ocular motions intact bilaterally V,VII: face symmetric, facial light touch sensation normal bilaterally VIII: hearing normal bilaterally XI: bilateral shoulder shrug XII: midline tongue extension Motor: Moves all extremities weakly but symmetrical with no focal weakness appreciated.  Sensory: Pinprick and light touch intact throughout, bilaterally.  Localizes to pain.     Lab Results: Basic Metabolic Panel: Recent Labs  Lab 08/25/24 0518 08/26/24 0832 08/27/24 0530 08/29/24 0434 08/30/24 1045  NA 138 139 139 146* 141  K 3.8 3.7 4.0 4.0 3.9  CL 105 106 104 108 105  CO2 23 23 22 22 22   GLUCOSE 103* 109* 122* 118* 150*  BUN 20 16 15 16 20   CREATININE 0.60 0.62 0.68 0.77 0.74  CALCIUM 8.7* 8.9 9.4 9.3 9.6    Liver Function Tests: Recent Labs  Lab 08/29/24 0434 08/30/24 1045  AST 71* 64*  ALT 71* 73*  ALKPHOS 109 112  BILITOT 0.7 1.0  PROT 7.1  7.5  ALBUMIN 3.5 3.5   No results for input(s): LIPASE, AMYLASE in the last 168 hours. Recent Labs  Lab 08/29/24 0434  AMMONIA 25    CBC: Recent Labs  Lab 08/25/24 0518 08/26/24 0832 08/27/24 0530 08/29/24 0434  WBC 7.3 7.5 8.7 9.7  NEUTROABS 4.3 5.0 5.9  --   HGB 14.3 14.2 15.0 14.7  HCT 43.0 42.7 46.9* 44.9  MCV 90.3 89.7 92.5 90.9  PLT 210 229 289 353    Cardiac Enzymes: No results for input(s): CKTOTAL, CKMB, CKMBINDEX, TROPONINI in the last 168 hours.  Lipid Panel: No results for input(s): CHOL, TRIG, HDL, CHOLHDL, VLDL, LDLCALC in the last 168 hours.  CBG: Recent Labs  Lab 08/27/24 2307  GLUCAP 112*    Microbiology: Results for orders placed or performed during the hospital encounter of 08/15/24  Urine Culture (for pregnant, neutropenic or urologic patients or patients with an indwelling urinary catheter)     Status: Abnormal   Collection Time: 08/16/24  5:07 AM   Specimen: Urine, Clean Catch  Result Value Ref Range Status   Specimen Description   Final    URINE, CLEAN CATCH Performed at [facility], [address], [place], [place] [address]    Special Requests   Final    NONE Performed  at [facility], [address] [place], [place] [address]    Culture (A)  Final    30,000 COLONIES/mL PROTEUS MIRABILIS >=100,000 COLONIES/mL ESCHERICHIA COLI    Report Status 08/20/2024 FINAL  Final   Organism ID, Bacteria PROTEUS MIRABILIS (A)  Final   Organism ID, Bacteria ESCHERICHIA COLI (A)  Final      Susceptibility   Escherichia coli - MIC*    AMPICILLIN >=32 RESISTANT Resistant     CEFAZOLIN  (URINE) Value in next row Sensitive      2 SENSITIVEThis is a modified FDA-approved test that has been validated and its performance characteristics determined by the reporting laboratory.  This laboratory is certified under the Clinical Laboratory Improvement Amendments CLIA as qualified to perform high complexity  clinical laboratory testing.    CEFEPIME Value in next row Sensitive      2 SENSITIVEThis is a modified FDA-approved test that has been validated and its performance characteristics determined by the reporting laboratory.  This laboratory is certified under the Clinical Laboratory Improvement Amendments CLIA as qualified to perform high complexity clinical laboratory testing.    ERTAPENEM Value in next row Sensitive      2 SENSITIVEThis is a modified FDA-approved test that has been validated and its performance characteristics determined by the reporting laboratory.  This laboratory is certified under the Clinical Laboratory Improvement Amendments CLIA as qualified to perform high complexity clinical laboratory testing.    CEFTRIAXONE  Value in next row Sensitive      2 SENSITIVEThis is a modified FDA-approved test that has been validated and its performance characteristics determined by the reporting laboratory.  This laboratory is certified under the Clinical Laboratory Improvement Amendments CLIA as qualified to perform high complexity clinical laboratory testing.    CIPROFLOXACIN Value in next row Sensitive      2 SENSITIVEThis is a modified FDA-approved test that has been validated and its performance characteristics determined by the reporting laboratory.  This laboratory is certified under the Clinical Laboratory Improvement Amendments CLIA as qualified to perform high complexity clinical laboratory testing.    GENTAMICIN Value in next row Sensitive      2 SENSITIVEThis is a modified FDA-approved test that has been validated and its performance characteristics determined by the reporting laboratory.  This laboratory is certified under the Clinical Laboratory Improvement Amendments CLIA as qualified to perform high complexity clinical laboratory testing.    NITROFURANTOIN Value in next row Sensitive      2 SENSITIVEThis is a modified FDA-approved test that has been validated and its performance  characteristics determined by the reporting laboratory.  This laboratory is certified under the Clinical Laboratory Improvement Amendments CLIA as qualified to perform high complexity clinical laboratory testing.    TRIMETH/SULFA Value in next row Sensitive      2 SENSITIVEThis is a modified FDA-approved test that has been validated and its performance characteristics determined by the reporting laboratory.  This laboratory is certified under the Clinical Laboratory Improvement Amendments CLIA as qualified to perform high complexity clinical laboratory testing.    AMPICILLIN/SULBACTAM Value in next row Sensitive      2 SENSITIVEThis is a modified FDA-approved test that has been validated and its performance characteristics determined by the reporting laboratory.  This laboratory is certified under the Clinical Laboratory Improvement Amendments CLIA as qualified to perform high complexity clinical laboratory testing.    PIP/TAZO Value in next row Sensitive      <=4 SENSITIVEThis is a modified FDA-approved test that has been  validated and its performance characteristics determined by the reporting laboratory.  This laboratory is certified under the Clinical Laboratory Improvement Amendments CLIA as qualified to perform high complexity clinical laboratory testing.    MEROPENEM Value in next row Sensitive      <=4 SENSITIVEThis is a modified FDA-approved test that has been validated and its performance characteristics determined by the reporting laboratory.  This laboratory is certified under the Clinical Laboratory Improvement Amendments CLIA as qualified to perform high complexity clinical laboratory testing.    * >=100,000 COLONIES/mL ESCHERICHIA COLI   Proteus mirabilis - MIC*    AMPICILLIN Value in next row Sensitive      <=4 SENSITIVEThis is a modified FDA-approved test that has been validated and its performance characteristics determined by the reporting laboratory.  This laboratory is certified  under the Clinical Laboratory Improvement Amendments CLIA as qualified to perform high complexity clinical laboratory testing.    CEFAZOLIN  (URINE) Value in next row Sensitive      8 SENSITIVEThis is a modified FDA-approved test that has been validated and its performance characteristics determined by the reporting laboratory.  This laboratory is certified under the Clinical Laboratory Improvement Amendments CLIA as qualified to perform high complexity clinical laboratory testing.    CEFEPIME Value in next row Sensitive      8 SENSITIVEThis is a modified FDA-approved test that has been validated and its performance characteristics determined by the reporting laboratory.  This laboratory is certified under the Clinical Laboratory Improvement Amendments CLIA as qualified to perform high complexity clinical laboratory testing.    ERTAPENEM Value in next row Sensitive      8 SENSITIVEThis is a modified FDA-approved test that has been validated and its performance characteristics determined by the reporting laboratory.  This laboratory is certified under the Clinical Laboratory Improvement Amendments CLIA as qualified to perform high complexity clinical laboratory testing.    CEFTRIAXONE  Value in next row Sensitive      8 SENSITIVEThis is a modified FDA-approved test that has been validated and its performance characteristics determined by the reporting laboratory.  This laboratory is certified under the Clinical Laboratory Improvement Amendments CLIA as qualified to perform high complexity clinical laboratory testing.    CIPROFLOXACIN Value in next row Sensitive      8 SENSITIVEThis is a modified FDA-approved test that has been validated and its performance characteristics determined by the reporting laboratory.  This laboratory is certified under the Clinical Laboratory Improvement Amendments CLIA as qualified to perform high complexity clinical laboratory testing.    GENTAMICIN Value in next row Sensitive       8 SENSITIVEThis is a modified FDA-approved test that has been validated and its performance characteristics determined by the reporting laboratory.  This laboratory is certified under the Clinical Laboratory Improvement Amendments CLIA as qualified to perform high complexity clinical laboratory testing.    NITROFURANTOIN Value in next row Resistant      8 SENSITIVEThis is a modified FDA-approved test that has been validated and its performance characteristics determined by the reporting laboratory.  This laboratory is certified under the Clinical Laboratory Improvement Amendments CLIA as qualified to perform high complexity clinical laboratory testing.    TRIMETH/SULFA Value in next row Sensitive      8 SENSITIVEThis is a modified FDA-approved test that has been validated and its performance characteristics determined by the reporting laboratory.  This laboratory is certified under the Clinical Laboratory Improvement Amendments CLIA as qualified to perform high complexity clinical laboratory testing.  AMPICILLIN/SULBACTAM Value in next row Sensitive      8 SENSITIVEThis is a modified FDA-approved test that has been validated and its performance characteristics determined by the reporting laboratory.  This laboratory is certified under the Clinical Laboratory Improvement Amendments CLIA as qualified to perform high complexity clinical laboratory testing.    PIP/TAZO Value in next row Sensitive      <=4 SENSITIVEThis is a modified FDA-approved test that has been validated and its performance characteristics determined by the reporting laboratory.  This laboratory is certified under the Clinical Laboratory Improvement Amendments CLIA as qualified to perform high complexity clinical laboratory testing.    MEROPENEM Value in next row Sensitive      <=4 SENSITIVEThis is a modified FDA-approved test that has been validated and its performance characteristics determined by the reporting laboratory.  This  laboratory is certified under the Clinical Laboratory Improvement Amendments CLIA as qualified to perform high complexity clinical laboratory testing.    * 30,000 COLONIES/mL PROTEUS MIRABILIS  Resp panel by RT-PCR (RSV, Flu A&B, Covid) Anterior Nasal Swab     Status: None   Collection Time: 08/20/24  5:34 PM   Specimen: Anterior Nasal Swab  Result Value Ref Range Status   SARS Coronavirus 2 by RT PCR NEGATIVE NEGATIVE Final    Comment: (NOTE) SARS-CoV-2 target nucleic acids are NOT DETECTED.  The SARS-CoV-2 RNA is generally detectable in upper respiratory specimens during the acute phase of infection. The lowest concentration of SARS-CoV-2 viral copies this assay can detect is 138 copies/mL. A negative result does not preclude SARS-Cov-2 infection and should not be used as the sole basis for treatment or other patient management decisions. A negative result may occur with  improper specimen collection/handling, submission of specimen other than nasopharyngeal swab, presence of viral mutation(s) within the areas targeted by this assay, and inadequate number of viral copies(<138 copies/mL). A negative result must be combined with clinical observations, patient history, and epidemiological information. The expected result is Negative.  Fact Sheet for Patients:  bloggercourse.com  Fact Sheet for Healthcare Providers:  seriousbroker.it  This test is no t yet approved or cleared by the [place]  FDA and  has been authorized for detection and/or diagnosis of SARS-CoV-2 by FDA under an Emergency Use Authorization (EUA). This EUA will remain  in effect (meaning this test can be used) for the duration of the COVID-19 declaration under Section 564(b)(1) of the Act, 21 U.S.C.section 360bbb-3(b)(1), unless the authorization is terminated  or revoked sooner.       Influenza A by PCR NEGATIVE NEGATIVE Final   Influenza B by PCR NEGATIVE  NEGATIVE Final    Comment: (NOTE) The Xpert Xpress SARS-CoV-2/FLU/RSV plus assay is intended as an aid in the diagnosis of influenza from Nasopharyngeal swab specimens and should not be used as a sole basis for treatment. Nasal washings and aspirates are unacceptable for Xpert Xpress SARS-CoV-2/FLU/RSV testing.  Fact Sheet for Patients: bloggercourse.com  Fact Sheet for Healthcare Providers: seriousbroker.it  This test is not yet approved or cleared by the [place]  FDA and has been authorized for detection and/or diagnosis of SARS-CoV-2 by FDA under an Emergency Use Authorization (EUA). This EUA will remain in effect (meaning this test can be used) for the duration of the COVID-19 declaration under Section 564(b)(1) of the Act, 21 U.S.C. section 360bbb-3(b)(1), unless the authorization is terminated or revoked.     Resp Syncytial Virus by PCR NEGATIVE NEGATIVE Final    Comment: (NOTE) Fact Sheet for Patients:  bloggercourse.com  Fact Sheet for Healthcare Providers: seriousbroker.it  This test is not yet approved or cleared by the [place]  FDA and has been authorized for detection and/or diagnosis of SARS-CoV-2 by FDA under an Emergency Use Authorization (EUA). This EUA will remain in effect (meaning this test can be used) for the duration of the COVID-19 declaration under Section 564(b)(1) of the Act, 21 U.S.C. section 360bbb-3(b)(1), unless the authorization is terminated or revoked.  Performed at [facility], [address] [place], [place] [address]   Respiratory (~20 pathogens) panel by PCR     Status: Abnormal   Collection Time: 08/21/24  8:23 AM   Specimen: Nasopharyngeal Swab; Respiratory  Result Value Ref Range Status   Adenovirus NOT DETECTED NOT DETECTED Final   Coronavirus 229E NOT DETECTED NOT DETECTED Final    Comment: (NOTE) The  Coronavirus on the Respiratory Panel, DOES NOT test for the novel  Coronavirus (2019 nCoV)    Coronavirus HKU1 NOT DETECTED NOT DETECTED Final   Coronavirus NL63 NOT DETECTED NOT DETECTED Final   Coronavirus OC43 NOT DETECTED NOT DETECTED Final   Metapneumovirus NOT DETECTED NOT DETECTED Final   Rhinovirus / Enterovirus NOT DETECTED NOT DETECTED Final   Influenza A NOT DETECTED NOT DETECTED Final   Influenza B NOT DETECTED NOT DETECTED Final   Parainfluenza Virus 1 DETECTED (A) NOT DETECTED Final   Parainfluenza Virus 2 NOT DETECTED NOT DETECTED Final   Parainfluenza Virus 3 NOT DETECTED NOT DETECTED Final   Parainfluenza Virus 4 NOT DETECTED NOT DETECTED Final   Respiratory Syncytial Virus NOT DETECTED NOT DETECTED Final   Bordetella pertussis NOT DETECTED NOT DETECTED Final   Bordetella Parapertussis NOT DETECTED NOT DETECTED Final   Chlamydophila pneumoniae NOT DETECTED NOT DETECTED Final   Mycoplasma pneumoniae NOT DETECTED NOT DETECTED Final    Comment: Performed at [facility], [address] [place], [place] [address]  MRSA Next Gen by PCR, Nasal     Status: Abnormal   Collection Time: 08/21/24  8:23 AM   Specimen: Nasal Mucosa; Nasal Swab  Result Value Ref Range Status   MRSA by PCR Next Gen DETECTED (A) NOT DETECTED Final    Comment: RESULT CALLED TO, READ BACK BY AND VERIFIED WITH: ALLIE T. RN 1032 08/21/24 HNM (NOTE) The GeneXpert MRSA Assay (FDA approved for NASAL specimens only), is one component of a comprehensive MRSA colonization surveillance program. It is not intended to diagnose MRSA infection nor to guide or monitor treatment for MRSA infections. Test performance is not FDA approved in patients less than 50 years old. Performed at [facility], [address] [place], [place] [address]   Culture, blood (Routine X 2) w Reflex to ID Panel     Status: None (Preliminary result)   Collection Time: 08/28/24  1:40 PM   Specimen: BLOOD  Result  Value Ref Range Status   Specimen Description BLOOD BLOOD RIGHT HAND  Final   Special Requests   Final    BOTTLES DRAWN AEROBIC AND ANAEROBIC Blood Culture adequate volume   Culture   Final    NO GROWTH 3 DAYS Performed at [facility], [address] [place], [place] [address]    Report Status PENDING  Incomplete  Culture, blood (Routine X 2) w Reflex to ID Panel     Status: None (Preliminary result)   Collection Time: 08/28/24  1:40 PM   Specimen: BLOOD  Result Value Ref Range Status   Specimen Description BLOOD  BLOOD LEFT HAND  Final   Special Requests   Final    BOTTLES DRAWN AEROBIC AND ANAEROBIC Blood Culture adequate volume   Culture   Final    NO GROWTH 3 DAYS Performed at [facility], [address] [place], [place] [address]    Report Status PENDING  Incomplete    Coagulation Studies: No results for input(s): LABPROT, INR in the last 72 hours.  Imaging: MR BRAIN WO CONTRAST Result Date: 08/30/2024 EXAM: MRI BRAIN WITHOUT CONTRAST 08/29/2024 11:57:09 PM TECHNIQUE: Multiplanar multisequence MRI of the head/brain was performed without the administration of intravenous contrast. COMPARISON: CT head earlier today. CLINICAL HISTORY: Altered mental status. FINDINGS: BRAIN AND VENTRICLES: No acute infarct. No intracranial hemorrhage. No mass. No midline shift. No hydrocephalus. The sella is unremarkable. Normal flow voids. ORBITS: No acute abnormality. SINUSES AND MASTOIDS: No acute abnormality. BONES AND SOFT TISSUES: Normal marrow signal. No acute soft tissue abnormality. IMPRESSION: 1. No acute intracranial abnormality. Electronically signed by: Gilmore Molt MD 08/30/2024 02:11 AM EST RP Workstation: [id number]    Medications: I have reviewed the patient's current medications. Scheduled:  enoxaparin  (LOVENOX ) injection  60 mg Subcutaneous Q24H   escitalopram   10 mg Oral Daily   feeding supplement  237 mL Oral BID BM   folic acid   1 mg Oral Daily    multivitamin with minerals  1 tablet Oral Daily   nystatin    Topical BID   mouth rinse  15 mL Mouth Rinse 4 times per day    Assessment/Plan: 68 y.o. female who due to mental status is unable to provide any history therefore all history obtained from the chart.   Patient with medical history significant of alcohol use disorder, dCHF, CAD, depression with anxiety, morbid obesity, ulcerative colitis, NSVT, PSVT, varicose vein in both legs, carotid artery stenosis, morbid obesity, who presented initially with left leg swelling and pain.  Developed confusion in the ED.  Initially was not febrile but developed fevers while hospitalized.  Was found to have a UTI and there was infection of the left leg as well.  There was question of PNA but this has been ruled out.  Positive for Parainfluenza virus.   Placed on CIWA protocol on admission.  Has been on Zithromycin, Ceftriaxone , Vancomycin  during her stay.  Currently afrebile and with normal wbc count.  Altered mental status has continued although somewhat better today.   Etiology of altered mental status likely multifactorial and related to infection and ETOH withdrawal.  At this point the withdrawal may be playing the biggest part.  There are a percentage of patients that go through ETOH withdrawal and never return to baseline.  At this point very low likelihood that the patient has a partially treated meningitis but if patient begins to deteriorate will consider the need for an LP.  MRI of the brain unremarkable. B12, TSH, folate are normal.  B1 pending.  Recommendations: Continue high dose Thiamine     LOS: 16 days   Sonny Hock, MD Neurology  08/31/2024  10:13 AM

## 2024-08-31 NOTE — Progress Notes (Signed)
 SLP Cancellation Note  Patient Details Name: Haley Deleon MRN: [id number] DOB: 1956-08-08   Cancelled treatment:       Reason Eval/Treat Not Completed: [name] level of consciousness. Pt lethargic- not responding to verbal/tactile cues. Oral care completed, without response from pt. Oral cavity notably dry with pt demonstrating primarily mouth breathing pattern. Recommend pt be NPO given current LOA. MD and RN updated. Recommend frequent oral care as able.  Muhannad Bignell Clapp, MS, CCC-SLP Speech Language Pathologist Rehab Services; [facility] [phone] (ascom)     Jocelynn Gioffre J Clapp 08/31/2024, 12:01 PM

## 2024-09-01 ENCOUNTER — Inpatient Hospital Stay

## 2024-09-01 DIAGNOSIS — G9341 Metabolic encephalopathy: Secondary | ICD-10-CM | POA: Diagnosis not present

## 2024-09-01 DIAGNOSIS — J122 Parainfluenza virus pneumonia: Secondary | ICD-10-CM | POA: Diagnosis not present

## 2024-09-01 DIAGNOSIS — J690 Pneumonitis due to inhalation of food and vomit: Secondary | ICD-10-CM

## 2024-09-01 DIAGNOSIS — R509 Fever, unspecified: Secondary | ICD-10-CM | POA: Diagnosis not present

## 2024-09-01 DIAGNOSIS — R4182 Altered mental status, unspecified: Secondary | ICD-10-CM | POA: Diagnosis not present

## 2024-09-01 DIAGNOSIS — F10930 Alcohol use, unspecified with withdrawal, uncomplicated: Secondary | ICD-10-CM

## 2024-09-01 DIAGNOSIS — F418 Other specified anxiety disorders: Secondary | ICD-10-CM

## 2024-09-01 LAB — COMPREHENSIVE METABOLIC PANEL WITH GFR
ALT: 53 U/L — ABNORMAL HIGH (ref 0–44)
AST: 39 U/L (ref 15–41)
Albumin: 3.6 g/dL (ref 3.5–5.0)
Alkaline Phosphatase: 115 U/L (ref 38–126)
Anion gap: 13 (ref 5–15)
BUN: 28 mg/dL — ABNORMAL HIGH (ref 8–23)
CO2: 21 mmol/L — ABNORMAL LOW (ref 22–32)
Calcium: 9.5 mg/dL (ref 8.9–10.3)
Chloride: 107 mmol/L (ref 98–111)
Creatinine, Ser: 1.01 mg/dL — ABNORMAL HIGH (ref 0.44–1.00)
GFR, Estimated: >60 mL/min (ref >60)
Glucose, Bld: 188 mg/dL — ABNORMAL HIGH (ref 70–99)
Potassium: 3.8 mmol/L (ref 3.5–5.1)
Sodium: 141 mmol/L (ref 135–145)
Total Bilirubin: 1 mg/dL (ref 0.0–1.2)
Total Protein: 7.6 g/dL (ref 6.5–8.1)

## 2024-09-01 LAB — CBC
HCT: 45.7 % (ref 36.0–46.0)
HCT: 45.6 % (ref 36.0–46.0)
Hemoglobin: 14.9 g/dL (ref 12.0–15.0)
Hemoglobin: 14.7 g/dL (ref 12.0–15.0)
MCH: 30.1 pg (ref 26.0–34.0)
MCH: 29.9 pg (ref 26.0–34.0)
MCHC: 32.6 g/dL (ref 30.0–36.0)
MCHC: 32.2 g/dL (ref 30.0–36.0)
MCV: 92.3 fL (ref 80.0–100.0)
MCV: 92.7 fL (ref 80.0–100.0)
Platelets: 230 K/uL (ref 150–400)
Platelets: 240 K/uL (ref 150–400)
RBC: 4.95 MIL/uL (ref 3.87–5.11)
RBC: 4.92 MIL/uL (ref 3.87–5.11)
RDW: 13.2 % (ref 11.5–15.5)
RDW: 13.4 % (ref 11.5–15.5)
WBC: 13.6 K/uL — ABNORMAL HIGH (ref 4.0–10.5)
WBC: 22.8 K/uL — ABNORMAL HIGH (ref 4.0–10.5)
nRBC: 0 % (ref 0.0–0.2)
nRBC: 0 % (ref 0.0–0.2)

## 2024-09-01 LAB — ENA+DNA/DS+ANTICH+CENTRO+JO...
Anti JO-1: <0.2 AI (ref 0.0–0.9)
Centromere Ab Screen: <0.2 AI (ref 0.0–0.9)
Chromatin Ab SerPl-aCnc: <0.2 AI (ref 0.0–0.9)
ENA SM Ab Ser-aCnc: <0.2 AI (ref 0.0–0.9)
Ribonucleic Protein: 2.8 AI — ABNORMAL HIGH (ref 0.0–0.9)
SSA (Ro) (ENA) Antibody, IgG: <0.2 AI (ref 0.0–0.9)
SSB (La) (ENA) Antibody, IgG: 0.4 AI (ref 0.0–0.9)
Scleroderma (Scl-70) (ENA) Antibody, IgG: <0.2 AI (ref 0.0–0.9)
ds DNA Ab: <1 [IU]/mL (ref 0–9)

## 2024-09-01 LAB — RENAL FUNCTION PANEL
Albumin: 3.5 g/dL (ref 3.5–5.0)
Anion gap: 16 — ABNORMAL HIGH (ref 5–15)
BUN: 30 mg/dL — ABNORMAL HIGH (ref 8–23)
CO2: 21 mmol/L — ABNORMAL LOW (ref 22–32)
Calcium: 9.2 mg/dL (ref 8.9–10.3)
Chloride: 105 mmol/L (ref 98–111)
Creatinine, Ser: 1.2 mg/dL — ABNORMAL HIGH (ref 0.44–1.00)
GFR, Estimated: 49 mL/min — ABNORMAL LOW (ref >60)
Glucose, Bld: 213 mg/dL — ABNORMAL HIGH (ref 70–99)
Phosphorus: 4.4 mg/dL (ref 2.5–4.6)
Potassium: 3.7 mmol/L (ref 3.5–5.1)
Sodium: 142 mmol/L (ref 135–145)

## 2024-09-01 LAB — BLOOD GAS, ARTERIAL
Acid-base deficit: 7 mmol/L — ABNORMAL HIGH (ref 0.0–2.0)
Bicarbonate: 19.7 mmol/L — ABNORMAL LOW (ref 20.0–28.0)
FIO2: 60 %
MECHVT: 450 mL
Mechanical Rate: 15
O2 Saturation: 100 %
PEEP: 5 cmH2O
Patient temperature: 37
pCO2 arterial: 43 mmHg (ref 32–48)
pH, Arterial: 7.27 — ABNORMAL LOW (ref 7.35–7.45)
pO2, Arterial: 168 mmHg — ABNORMAL HIGH (ref 83–108)

## 2024-09-01 LAB — RHEUMATOID FACTOR: Rheumatoid fact SerPl-aCnc: 13.4 [IU]/mL (ref <14.0)

## 2024-09-01 LAB — ANA W/REFLEX IF POSITIVE: Anti Nuclear Antibody (ANA): POSITIVE — AB

## 2024-09-01 LAB — LACTIC ACID, PLASMA
Lactic Acid, Venous: 1.4 mmol/L (ref 0.5–1.9)
Lactic Acid, Venous: 4.4 mmol/L (ref 0.5–1.9)

## 2024-09-01 LAB — GLUCOSE, CAPILLARY
Glucose-Capillary: 175 mg/dL — ABNORMAL HIGH (ref 70–99)
Glucose-Capillary: 197 mg/dL — ABNORMAL HIGH (ref 70–99)
Glucose-Capillary: 229 mg/dL — ABNORMAL HIGH (ref 70–99)

## 2024-09-01 LAB — SYPHILIS: RPR W/REFLEX TO RPR TITER AND TREPONEMAL ANTIBODIES, TRADITIONAL SCREENING AND DIAGNOSIS ALGORITHM: RPR Ser Ql: NONREACTIVE

## 2024-09-01 MED ORDER — NOREPINEPHRINE 16 MG/250ML-% IV SOLN: 0.0000 ug/min | INTRAVENOUS | Status: DC

## 2024-09-01 MED ORDER — PANTOPRAZOLE SODIUM 40 MG IV SOLR
40.0000 mg | Freq: Every day | INTRAVENOUS | Status: DC
  Administered 2024-09-01 – 2024-09-05 (×5): 40 mg via INTRAVENOUS
  Filled 2024-09-01 (×5): qty 10

## 2024-09-01 MED ORDER — LACTATED RINGERS IV BOLUS
1000.0000 mL | Freq: Once | INTRAVENOUS | Status: AC
  Administered 2024-09-01: 1000 mL via INTRAVENOUS

## 2024-09-01 MED ORDER — NOREPINEPHRINE 4 MG/250ML-% IV SOLN
INTRAVENOUS | Status: AC
  Filled 2024-09-01: qty 250

## 2024-09-01 MED ORDER — VANCOMYCIN HCL 1250 MG/250ML IV SOLN: 1250.0000 mg | INTRAVENOUS | Status: DC

## 2024-09-01 MED ORDER — ETOMIDATE 2 MG/ML IV SOLN: 20.0000 mg | Freq: Once | INTRAVENOUS | Status: AC

## 2024-09-01 MED ORDER — NOREPINEPHRINE 16 MG/250ML-% IV SOLN
0.0000 ug/min | INTRAVENOUS | Status: DC
  Administered 2024-09-01: 14 ug/min via INTRAVENOUS
  Administered 2024-09-02: 34 ug/min via INTRAVENOUS
  Administered 2024-09-02: 3 ug/min via INTRAVENOUS
  Filled 2024-09-01 (×3): qty 250

## 2024-09-01 MED ORDER — VANCOMYCIN HCL 1500 MG/300ML IV SOLN
1500.0000 mg | Freq: Once | INTRAVENOUS | Status: AC
  Administered 2024-09-01: 1500 mg via INTRAVENOUS
  Filled 2024-09-01: qty 300

## 2024-09-01 MED ORDER — PROPOFOL 1000 MG/100ML IV EMUL
INTRAVENOUS | Status: AC
  Filled 2024-09-01: qty 100

## 2024-09-01 MED ORDER — PROPOFOL 1000 MG/100ML IV EMUL
0.0000 ug/kg/min | INTRAVENOUS | Status: DC
  Administered 2024-09-01 (×2): 15 ug/kg/min via INTRAVENOUS
  Administered 2024-09-01: 60 ug/kg/min via INTRAVENOUS
  Administered 2024-09-02: 35 ug/kg/min via INTRAVENOUS
  Administered 2024-09-02: 30 ug/kg/min via INTRAVENOUS
  Administered 2024-09-03: 20 ug/kg/min via INTRAVENOUS
  Administered 2024-09-03 – 2024-09-04 (×2): 10 ug/kg/min via INTRAVENOUS
  Administered 2024-09-04: 20:00:00 25 ug/kg/min via INTRAVENOUS
  Administered 2024-09-04: 04:00:00 20 ug/kg/min via INTRAVENOUS
  Administered 2024-09-04: 14:00:00 40 ug/kg/min via INTRAVENOUS
  Administered 2024-09-05: 01:00:00 25 ug/kg/min via INTRAVENOUS
  Administered 2024-09-05: 17:00:00 45 ug/kg/min via INTRAVENOUS
  Administered 2024-09-05: 07:00:00 25 ug/kg/min via INTRAVENOUS
  Filled 2024-09-01 (×13): qty 100

## 2024-09-01 MED ORDER — ORAL CARE MOUTH RINSE
15.0000 mL | OROMUCOSAL | Status: DC
  Administered 2024-09-01 – 2024-09-05 (×46): 15 mL via OROMUCOSAL

## 2024-09-01 MED ORDER — POLYETHYLENE GLYCOL 3350 17 G PO PACK
17.0000 g | PACK | Freq: Every day | ORAL | Status: DC
  Administered 2024-09-01: 17 g
  Filled 2024-09-01: qty 1

## 2024-09-01 MED ORDER — CHLORHEXIDINE GLUCONATE CLOTH 2 % EX PADS
6.0000 | MEDICATED_PAD | Freq: Every day | CUTANEOUS | Status: DC
  Administered 2024-09-01: 6 via TOPICAL

## 2024-09-01 MED ORDER — VANCOMYCIN HCL IN DEXTROSE 1-5 GM/200ML-% IV SOLN
1000.0000 mg | Freq: Once | INTRAVENOUS | Status: AC
  Administered 2024-09-01: 1000 mg via INTRAVENOUS
  Filled 2024-09-01: qty 200

## 2024-09-01 MED ORDER — FENTANYL CITRATE (PF) 50 MCG/ML IJ SOSY
50.0000 ug | PREFILLED_SYRINGE | INTRAMUSCULAR | Status: AC | PRN
  Administered 2024-09-01 – 2024-09-03 (×3): 50 ug via INTRAVENOUS
  Filled 2024-09-01 (×3): qty 1

## 2024-09-01 MED ORDER — ETOMIDATE 2 MG/ML IV SOLN
INTRAVENOUS | Status: AC
  Administered 2024-09-01: 20 mg
  Filled 2024-09-01: qty 10

## 2024-09-01 MED ORDER — FENTANYL 2500MCG IN NS 250ML (10MCG/ML) PREMIX INFUSION
0.0000 ug/h | INTRAVENOUS | Status: DC
  Administered 2024-09-01: 25 ug/h via INTRAVENOUS
  Filled 2024-09-01: qty 250

## 2024-09-01 MED ORDER — DOCUSATE SODIUM 50 MG/5ML PO LIQD
100.0000 mg | Freq: Two times a day (BID) | ORAL | Status: DC
  Administered 2024-09-01: 100 mg
  Filled 2024-09-01 (×2): qty 10

## 2024-09-01 MED ORDER — ROCURONIUM BROMIDE 10 MG/ML (PF) SYRINGE
PREFILLED_SYRINGE | INTRAVENOUS | Status: AC
  Administered 2024-09-01: 100 mg
  Filled 2024-09-01: qty 10

## 2024-09-01 MED ORDER — FENTANYL CITRATE (PF) 50 MCG/ML IJ SOSY
50.0000 ug | PREFILLED_SYRINGE | INTRAMUSCULAR | Status: DC | PRN
  Administered 2024-09-04 – 2024-09-05 (×2): 50 ug via INTRAVENOUS
  Filled 2024-09-01 (×2): qty 1

## 2024-09-01 MED ORDER — ROCURONIUM BROMIDE 10 MG/ML (PF) SYRINGE: 100.0000 mg | PREFILLED_SYRINGE | Freq: Once | INTRAVENOUS | Status: AC

## 2024-09-01 NOTE — Progress Notes (Signed)
 INFECTIOUS DISEASE PROGRESS NOTE Date of Admission:  08/15/2024     ID: Haley Deleon is a 68 y.o. female with  fever, ams Principal Problem:   Acute metabolic encephalopathy Active Problems:   Ulcerative colitis (HCC)   Alcohol withdrawal (HCC)   Chronic diastolic CHF (congestive heart failure) (HCC)   Intertriginous candidiasis   Depression   Pain and swelling of left lower leg   Obesity, Class III, BMI 40-49.9 (morbid obesity) (HCC)   UTI (urinary tract infection)   Subjective: No fevers.  Remains waxing and waning  responsiveness.    ROS  Unable to obtain   Medications:  Antibiotics Given (last 72 hours)     Date/Time Action Medication Dose Rate   08/31/24 1656 New Bag/Given   piperacillin-tazobactam (ZOSYN) IVPB 3.375 g 3.375 g 12.5 mL/hr   09/01/24 0025 New Bag/Given   piperacillin-tazobactam (ZOSYN) IVPB 3.375 g 3.375 g 12.5 mL/hr   09/01/24 0907 New Bag/Given   piperacillin-tazobactam (ZOSYN) IVPB 3.375 g 3.375 g 12.5 mL/hr       [START ON 09/02/2024] enoxaparin  (LOVENOX ) injection  60 mg Subcutaneous Q24H   escitalopram   10 mg Oral Daily   feeding supplement  237 mL Oral BID BM   folic acid   1 mg Oral Daily   multivitamin with minerals  1 tablet Oral Daily   nystatin    Topical BID   mouth rinse  15 mL Mouth Rinse 4 times per day    Objective: Vital signs in last 24 hours: Temp:  [99.1 F (37.3 C)] 99.1 F (37.3 C) (12/12 0831) Pulse Rate:  [91] 91 (12/12 0831) Resp:  [16] 16 (12/12 0831) BP: (123)/(70) 123/70 (12/12 0831) SpO2:  [98 %] 98 % (12/12 0831) Constitutional:  opens eyes but min responsive HENT: [place]/AT, PERRLA, no scleral icterus Mouth/Throat: Oropharynx is dry Neck supple Cardiovascular: Normal rate, regular rhythm and normal heart sounds.  Pulmonary/Chest:  Poor air movement  Neck = diff to assess neck movemetn Abdominal: Soft. Bowel sounds are normal.  exhibits no distension.  Lymphadenopathy: no cervical adenopathy. No axillary  adenopathy Neurological:  more interactive Skin: some abrasions and bruising.   Lab Results Recent Labs    08/30/24 1045 09/01/24 0522  WBC  --  13.6*  HGB  --  14.9  HCT  --  45.7  NA 141 141  K 3.9 3.8  CL 105 107  CO2 22 21*  BUN 20 28*  CREATININE 0.74 1.01*    Microbiology: Results for orders placed or performed during the hospital encounter of 08/15/24  Urine Culture (for pregnant, neutropenic or urologic patients or patients with an indwelling urinary catheter)     Status: Abnormal   Collection Time: 08/16/24  5:07 AM   Specimen: Urine, Clean Catch  Result Value Ref Range Status   Specimen Description   Final    URINE, CLEAN CATCH Performed at [facility], [address], [place], [place] [address]    Special Requests   Final    NONE Performed at [facility], [address] [place], [place] [address]    Culture (A)  Final    30,000 COLONIES/mL PROTEUS MIRABILIS >=100,000 COLONIES/mL ESCHERICHIA COLI    Report Status 08/20/2024 FINAL  Final   Organism ID, Bacteria PROTEUS MIRABILIS (A)  Final   Organism ID, Bacteria ESCHERICHIA COLI (A)  Final      Susceptibility   Escherichia coli - MIC*    AMPICILLIN >=32 RESISTANT Resistant     CEFAZOLIN  (  URINE) Value in next row Sensitive      2 SENSITIVEThis is a modified FDA-approved test that has been validated and its performance characteristics determined by the reporting laboratory.  This laboratory is certified under the Clinical Laboratory Improvement Amendments CLIA as qualified to perform high complexity clinical laboratory testing.    CEFEPIME Value in next row Sensitive      2 SENSITIVEThis is a modified FDA-approved test that has been validated and its performance characteristics determined by the reporting laboratory.  This laboratory is certified under the Clinical Laboratory Improvement Amendments CLIA as qualified to perform high complexity clinical laboratory testing.    ERTAPENEM  Value in next row Sensitive      2 SENSITIVEThis is a modified FDA-approved test that has been validated and its performance characteristics determined by the reporting laboratory.  This laboratory is certified under the Clinical Laboratory Improvement Amendments CLIA as qualified to perform high complexity clinical laboratory testing.    CEFTRIAXONE  Value in next row Sensitive      2 SENSITIVEThis is a modified FDA-approved test that has been validated and its performance characteristics determined by the reporting laboratory.  This laboratory is certified under the Clinical Laboratory Improvement Amendments CLIA as qualified to perform high complexity clinical laboratory testing.    CIPROFLOXACIN Value in next row Sensitive      2 SENSITIVEThis is a modified FDA-approved test that has been validated and its performance characteristics determined by the reporting laboratory.  This laboratory is certified under the Clinical Laboratory Improvement Amendments CLIA as qualified to perform high complexity clinical laboratory testing.    GENTAMICIN Value in next row Sensitive      2 SENSITIVEThis is a modified FDA-approved test that has been validated and its performance characteristics determined by the reporting laboratory.  This laboratory is certified under the Clinical Laboratory Improvement Amendments CLIA as qualified to perform high complexity clinical laboratory testing.    NITROFURANTOIN Value in next row Sensitive      2 SENSITIVEThis is a modified FDA-approved test that has been validated and its performance characteristics determined by the reporting laboratory.  This laboratory is certified under the Clinical Laboratory Improvement Amendments CLIA as qualified to perform high complexity clinical laboratory testing.    TRIMETH/SULFA Value in next row Sensitive      2 SENSITIVEThis is a modified FDA-approved test that has been validated and its performance characteristics determined by the  reporting laboratory.  This laboratory is certified under the Clinical Laboratory Improvement Amendments CLIA as qualified to perform high complexity clinical laboratory testing.    AMPICILLIN/SULBACTAM Value in next row Sensitive      2 SENSITIVEThis is a modified FDA-approved test that has been validated and its performance characteristics determined by the reporting laboratory.  This laboratory is certified under the Clinical Laboratory Improvement Amendments CLIA as qualified to perform high complexity clinical laboratory testing.    PIP/TAZO Value in next row Sensitive      <=4 SENSITIVEThis is a modified FDA-approved test that has been validated and its performance characteristics determined by the reporting laboratory.  This laboratory is certified under the Clinical Laboratory Improvement Amendments CLIA as qualified to perform high complexity clinical laboratory testing.    MEROPENEM Value in next row Sensitive      <=4 SENSITIVEThis is a modified FDA-approved test that has been validated and its performance characteristics determined by the reporting laboratory.  This laboratory is certified under the Clinical Laboratory Improvement Amendments CLIA as qualified to perform high  complexity clinical laboratory testing.    * >=100,000 COLONIES/mL ESCHERICHIA COLI   Proteus mirabilis - MIC*    AMPICILLIN Value in next row Sensitive      <=4 SENSITIVEThis is a modified FDA-approved test that has been validated and its performance characteristics determined by the reporting laboratory.  This laboratory is certified under the Clinical Laboratory Improvement Amendments CLIA as qualified to perform high complexity clinical laboratory testing.    CEFAZOLIN  (URINE) Value in next row Sensitive      8 SENSITIVEThis is a modified FDA-approved test that has been validated and its performance characteristics determined by the reporting laboratory.  This laboratory is certified under the Clinical Laboratory  Improvement Amendments CLIA as qualified to perform high complexity clinical laboratory testing.    CEFEPIME Value in next row Sensitive      8 SENSITIVEThis is a modified FDA-approved test that has been validated and its performance characteristics determined by the reporting laboratory.  This laboratory is certified under the Clinical Laboratory Improvement Amendments CLIA as qualified to perform high complexity clinical laboratory testing.    ERTAPENEM Value in next row Sensitive      8 SENSITIVEThis is a modified FDA-approved test that has been validated and its performance characteristics determined by the reporting laboratory.  This laboratory is certified under the Clinical Laboratory Improvement Amendments CLIA as qualified to perform high complexity clinical laboratory testing.    CEFTRIAXONE  Value in next row Sensitive      8 SENSITIVEThis is a modified FDA-approved test that has been validated and its performance characteristics determined by the reporting laboratory.  This laboratory is certified under the Clinical Laboratory Improvement Amendments CLIA as qualified to perform high complexity clinical laboratory testing.    CIPROFLOXACIN Value in next row Sensitive      8 SENSITIVEThis is a modified FDA-approved test that has been validated and its performance characteristics determined by the reporting laboratory.  This laboratory is certified under the Clinical Laboratory Improvement Amendments CLIA as qualified to perform high complexity clinical laboratory testing.    GENTAMICIN Value in next row Sensitive      8 SENSITIVEThis is a modified FDA-approved test that has been validated and its performance characteristics determined by the reporting laboratory.  This laboratory is certified under the Clinical Laboratory Improvement Amendments CLIA as qualified to perform high complexity clinical laboratory testing.    NITROFURANTOIN Value in next row Resistant      8 SENSITIVEThis is a  modified FDA-approved test that has been validated and its performance characteristics determined by the reporting laboratory.  This laboratory is certified under the Clinical Laboratory Improvement Amendments CLIA as qualified to perform high complexity clinical laboratory testing.    TRIMETH/SULFA Value in next row Sensitive      8 SENSITIVEThis is a modified FDA-approved test that has been validated and its performance characteristics determined by the reporting laboratory.  This laboratory is certified under the Clinical Laboratory Improvement Amendments CLIA as qualified to perform high complexity clinical laboratory testing.    AMPICILLIN/SULBACTAM Value in next row Sensitive      8 SENSITIVEThis is a modified FDA-approved test that has been validated and its performance characteristics determined by the reporting laboratory.  This laboratory is certified under the Clinical Laboratory Improvement Amendments CLIA as qualified to perform high complexity clinical laboratory testing.    PIP/TAZO Value in next row Sensitive      <=4 SENSITIVEThis is a modified FDA-approved test that has been validated and its performance characteristics  determined by the reporting laboratory.  This laboratory is certified under the Clinical Laboratory Improvement Amendments CLIA as qualified to perform high complexity clinical laboratory testing.    MEROPENEM Value in next row Sensitive      <=4 SENSITIVEThis is a modified FDA-approved test that has been validated and its performance characteristics determined by the reporting laboratory.  This laboratory is certified under the Clinical Laboratory Improvement Amendments CLIA as qualified to perform high complexity clinical laboratory testing.    * 30,000 COLONIES/mL PROTEUS MIRABILIS  Resp panel by RT-PCR (RSV, Flu A&B, Covid) Anterior Nasal Swab     Status: None   Collection Time: 08/20/24  5:34 PM   Specimen: Anterior Nasal Swab  Result Value Ref Range Status   SARS  Coronavirus 2 by RT PCR NEGATIVE NEGATIVE Final    Comment: (NOTE) SARS-CoV-2 target nucleic acids are NOT DETECTED.  The SARS-CoV-2 RNA is generally detectable in upper respiratory specimens during the acute phase of infection. The lowest concentration of SARS-CoV-2 viral copies this assay can detect is 138 copies/mL. A negative result does not preclude SARS-Cov-2 infection and should not be used as the sole basis for treatment or other patient management decisions. A negative result may occur with  improper specimen collection/handling, submission of specimen other than nasopharyngeal swab, presence of viral mutation(s) within the areas targeted by this assay, and inadequate number of viral copies(<138 copies/mL). A negative result must be combined with clinical observations, patient history, and epidemiological information. The expected result is Negative.  Fact Sheet for Patients:  bloggercourse.com  Fact Sheet for Healthcare Providers:  seriousbroker.it  This test is no t yet approved or cleared by the [place]  FDA and  has been authorized for detection and/or diagnosis of SARS-CoV-2 by FDA under an Emergency Use Authorization (EUA). This EUA will remain  in effect (meaning this test can be used) for the duration of the COVID-19 declaration under Section 564(b)(1) of the Act, 21 U.S.C.section 360bbb-3(b)(1), unless the authorization is terminated  or revoked sooner.       Influenza A by PCR NEGATIVE NEGATIVE Final   Influenza B by PCR NEGATIVE NEGATIVE Final    Comment: (NOTE) The Xpert Xpress SARS-CoV-2/FLU/RSV plus assay is intended as an aid in the diagnosis of influenza from Nasopharyngeal swab specimens and should not be used as a sole basis for treatment. Nasal washings and aspirates are unacceptable for Xpert Xpress SARS-CoV-2/FLU/RSV testing.  Fact Sheet for  Patients: bloggercourse.com  Fact Sheet for Healthcare Providers: seriousbroker.it  This test is not yet approved or cleared by the [place]  FDA and has been authorized for detection and/or diagnosis of SARS-CoV-2 by FDA under an Emergency Use Authorization (EUA). This EUA will remain in effect (meaning this test can be used) for the duration of the COVID-19 declaration under Section 564(b)(1) of the Act, 21 U.S.C. section 360bbb-3(b)(1), unless the authorization is terminated or revoked.     Resp Syncytial Virus by PCR NEGATIVE NEGATIVE Final    Comment: (NOTE) Fact Sheet for Patients: bloggercourse.com  Fact Sheet for Healthcare Providers: seriousbroker.it  This test is not yet approved or cleared by the [place]  FDA and has been authorized for detection and/or diagnosis of SARS-CoV-2 by FDA under an Emergency Use Authorization (EUA). This EUA will remain in effect (meaning this test can be used) for the duration of the COVID-19 declaration under Section 564(b)(1) of the Act, 21 U.S.C. section 360bbb-3(b)(1), unless the authorization is terminated or revoked.  Performed at [facility], [address] [place], [place] [address]   Respiratory (~20 pathogens) panel by PCR     Status: Abnormal   Collection Time: 08/21/24  8:23 AM   Specimen: Nasopharyngeal Swab; Respiratory  Result Value Ref Range Status   Adenovirus NOT DETECTED NOT DETECTED Final   Coronavirus 229E NOT DETECTED NOT DETECTED Final    Comment: (NOTE) The Coronavirus on the Respiratory Panel, DOES NOT test for the novel  Coronavirus (2019 nCoV)    Coronavirus HKU1 NOT DETECTED NOT DETECTED Final   Coronavirus NL63 NOT DETECTED NOT DETECTED Final   Coronavirus OC43 NOT DETECTED NOT DETECTED Final   Metapneumovirus NOT DETECTED NOT DETECTED Final   Rhinovirus / Enterovirus NOT DETECTED NOT  DETECTED Final   Influenza A NOT DETECTED NOT DETECTED Final   Influenza B NOT DETECTED NOT DETECTED Final   Parainfluenza Virus 1 DETECTED (A) NOT DETECTED Final   Parainfluenza Virus 2 NOT DETECTED NOT DETECTED Final   Parainfluenza Virus 3 NOT DETECTED NOT DETECTED Final   Parainfluenza Virus 4 NOT DETECTED NOT DETECTED Final   Respiratory Syncytial Virus NOT DETECTED NOT DETECTED Final   Bordetella pertussis NOT DETECTED NOT DETECTED Final   Bordetella Parapertussis NOT DETECTED NOT DETECTED Final   Chlamydophila pneumoniae NOT DETECTED NOT DETECTED Final   Mycoplasma pneumoniae NOT DETECTED NOT DETECTED Final    Comment: Performed at [facility], [address], [place], [place] [address]  MRSA Next Gen by PCR, Nasal     Status: Abnormal   Collection Time: 08/21/24  8:23 AM   Specimen: Nasal Mucosa; Nasal Swab  Result Value Ref Range Status   MRSA by PCR Next Gen DETECTED (A) NOT DETECTED Final    Comment: RESULT CALLED TO, READ BACK BY AND VERIFIED WITH: ALLIE T. RN 1032 08/21/24 HNM (NOTE) The GeneXpert MRSA Assay (FDA approved for NASAL specimens only), is one component of a comprehensive MRSA colonization surveillance program. It is not intended to diagnose MRSA infection nor to guide or monitor treatment for MRSA infections. Test performance is not FDA approved in patients less than 4 years old. Performed at [facility], [address] [place], [place] [address]   Culture, blood (Routine X 2) w Reflex to ID Panel     Status: None (Preliminary result)   Collection Time: 08/28/24  1:40 PM   Specimen: BLOOD  Result Value Ref Range Status   Specimen Description BLOOD BLOOD RIGHT HAND  Final   Special Requests   Final    BOTTLES DRAWN AEROBIC AND ANAEROBIC Blood Culture adequate volume   Culture   Final    NO GROWTH 4 DAYS Performed at [facility], [address], [place], [place] [address]    Report Status PENDING  Incomplete  Culture,  blood (Routine X 2) w Reflex to ID Panel     Status: None (Preliminary result)   Collection Time: 08/28/24  1:40 PM   Specimen: BLOOD  Result Value Ref Range Status   Specimen Description BLOOD BLOOD LEFT HAND  Final   Special Requests   Final    BOTTLES DRAWN AEROBIC AND ANAEROBIC Blood Culture adequate volume   Culture   Final    NO GROWTH 4 DAYS Performed at [facility], [address] [place], [place] [address]    Report Status PENDING  Incomplete  Culture, blood (Routine X 2) w Reflex to ID Panel     Status: None (Preliminary result)   Collection Time: 08/31/24  1:28 PM   Specimen: BLOOD  Result Value Ref Range Status   Specimen Description BLOOD BLOOD LEFT HAND  Final   Special Requests   Final    BOTTLES DRAWN AEROBIC AND ANAEROBIC Blood Culture adequate volume   Culture   Final    NO GROWTH < 24 HOURS Performed at [facility], [address], [place], [place] [address]    Report Status PENDING  Incomplete  Culture, blood (Routine X 2) w Reflex to ID Panel     Status: None (Preliminary result)   Collection Time: 08/31/24  1:28 PM   Specimen: BLOOD  Result Value Ref Range Status   Specimen Description BLOOD BLOOD RIGHT HAND  Final   Special Requests   Final    BOTTLES DRAWN AEROBIC AND ANAEROBIC Blood Culture adequate volume   Culture   Final    NO GROWTH < 24 HOURS Performed at [facility], [address], [place], [place] [address]    Report Status PENDING  Incomplete    Studies/Results: CT CHEST ABDOMEN PELVIS W CONTRAST Result Date: 08/31/2024 CLINICAL DATA:  Fever of unknown origin. EXAM: CT CHEST, ABDOMEN, AND PELVIS WITH CONTRAST TECHNIQUE: Multidetector CT imaging of the chest, abdomen and pelvis was performed following the standard protocol during bolus administration of intravenous contrast. RADIATION DOSE REDUCTION: This exam was performed according to the departmental dose-optimization program which includes automated  exposure control, adjustment of the mA and/or kV according to patient size and/or use of iterative reconstruction technique. CONTRAST:  OMNIPAQUE  IOHEXOL  300 MG/ML  SOLN COMPARISON:  Chest CTA on 03/05/2022, and AP CT on 02/27/2014 FINDINGS: CT CHEST FINDINGS Cardiovascular: No acute findings. Mediastinum/Lymph Nodes: No masses or pathologically enlarged lymph nodes identified. Lungs/Pleura: No suspicious pulmonary nodules or masses identified. No evidence of infiltrate or pleural effusion. Musculoskeletal: No suspicious bone lesions identified. Several old left lateral rib fracture deformities are noted. CT ABDOMEN AND PELVIS FINDINGS Hepatobiliary: No masses identified. Small cyst in the inferior right hepatic lobe is again noted. Gallbladder is unremarkable. No evidence of biliary ductal dilatation. Pancreas:  No mass or inflammatory changes. Spleen:  Within normal limits in size and appearance. Adrenals/Urinary tract: No No evidence of adrenal or renal masses. Severe right hydronephrosis and proximal ureterectasis is again seen with transition to nondilated ureter at the level of the crossing iliac vessels. No obstructing ureteral calculus or mass identified. Mild left hydronephrosis and proximal ureterectasis is new since 2023 exam, without No evidence of obstructing calculus or mass. This may be due to postoperative scarring related to previous colectomy and ileorectal anastomosis. Urinary bladder appears tethered anteriorly and superiorly, likely due to postop scarring, but is otherwise unremarkable. Stomach/Bowel: Postoperative changes are again seen from subtotal colectomy with ileorectal anastomosis. A small well-circumscribed gas collection is seen in the adjacent presacral region measuring 2.4 x 2.0 cm, where a percutaneous drainage catheter was previously located. No residual fluid or acute inflammatory changes identified. No evidence of bowel obstruction. Vascular/Lymphatic: No pathologically  enlarged lymph nodes identified. No acute vascular findings. Reproductive:  No mass or other significant abnormality identified. Other:  None. Musculoskeletal:  No suspicious bone lesions identified. IMPRESSION: No evidence of acute inflammatory process or abscess. Stable severe right hydronephrosis and proximal ureterectasis, and new mild left hydronephrosis and proximal ureterectasis. No obstructing calculus or mass identified. This is likely due to postop scarring related to previous bowel surgery. Decreased size of small presacral gas collection, where a percutaneous drainage catheter was previously located. This  is also likely postop in etiology. Electronically Signed   By: Norleen DELENA Kil M.D.   On: 08/31/2024 17:53   DG FL GUIDED LUMBAR PUNCTURE Result Date: 08/31/2024 CLINICAL DATA:  68 year old female with a history of alcohol use disorder who presented to the ED with lower extremity swelling and confusion. Currently admitted with acute metabolic encephalopathy of unclear origin and being treated for alcohol withdrawal, UTI, parainfluenza, and left leg infection. Request for lumbar puncture. EXAM: LUMBAR PUNCTURE UNDER FLUOROSCOPY PROCEDURE: An appropriate skin entry site was determined fluoroscopically. Operator donned sterile gloves and mask. Skin site was marked, then prepped with Betadine , draped in usual sterile fashion, and infiltrated locally with 1% lidocaine . A 20 gauge 6-inch spinal needle advanced into the thecal sac at L5-S1 from a right interlaminar approach. Blood was noted to the hub of the needle; this was cleaned out and needle placement adjusted sightly, but again only blood returned into the hub. A second attempt was made at the same level with a new needle, but only a small amount of blood returned into the hub. A third attempt to was made with a new 20 gauge 6-inch spinal needle and advanced into the thecal sac at L3-4 from a right interlaminar approach. Initially a small amount of  clear colorless CSF spontaneously returned, but was quickly followed by bloody output in the hub. This eventually clotted and obstructed the flow of CSF. An attempt was made to clean the needle out and reposition, but only a small amount of bloody return noted in the hub. Dr. Marcelino was then called into the room for additional assistance. A fourth and final attempt was made with a new 20 gauge 6-inch spinal needle at the L4-L5 level from a right interlaminar approach. Unfortunately, despite reassuring imaging, there was no return of CSF. The procedure was aborted and the needle was then removed. The patient tolerated the procedure well and there were no complications. FLUOROSCOPY: Radiation Exposure Index (as provided by the fluoroscopic device): 165.5 mGy Kerma IMPRESSION: Technically unsuccessful lumbar puncture under fluoroscopy with multiple attempts at repositioning of the spinal needle. No CSF obtained. This exam was performed by [facility] PA-C, and was supervised and interpreted by Dr. Harrietta Marcelino. Electronically Signed   By: Harrietta Marcelino M.D.   On: 08/31/2024 16:42    Assessment/Plan: Haley Deleon is a 68 y.o. female with AMS and fevers recurring after initial treatment for UTI and CAP. + parainfluenza on 12/1 Despite fevers wbc and procalc nml. CXR no change CT with chronic changes in head. MRI brain unimpressive. Venous doppler meg 11/25 Multiple possible etiologies for the fevers including UTI, aspiration, bacteremia, DVT, skin soft tissue infection   12/10 defervescing. Some improvement Mental status. UA not a clean catch but mixed. Some cough today. MRI head neg Neck is supple so doubt meningitis and the waxing and waning mental status would be unusual for bacterial meningitis  12/11 recurrent fevers.  She is sitting up in bed today.  Nurse tired to feed her  but she is pocketing food.  12/12 - wbc up some. Had CT scan chest abd pelvis. Tm 100.8 Bcx neg. LP attempts by IR  unsuccessful  Recommendations Cont zosyn for now. Will plan 5 day course then stop and monitor  Neurology has evaluated patient. LP could not be done.   Thank you very much for the consult. Will follow with you.  Alm SHAUNNA Needle   09/01/2024, 12:16 PM

## 2024-09-01 NOTE — Significant Event (Signed)
 Rapid Response Event Note   Reason for Call : called RRT for resp distress   Initial Focused Assessment: upon arrival pt agonal breathing... put on NRB and immediate tx for intubation.      Interventions: Dr Kandis at bedside, immediate tx for intubation   Plan of Care: as above    Event Summary: as above  MD Notified: Wouk/Dgayli 1612 Call Time:1612 Arrival Time:1613 End Upfz:[id number]  Shanisha Lech A, RN

## 2024-09-01 NOTE — Progress Notes (Signed)
 SLP Cancellation Note  Patient Details Name: Haley Deleon MRN: [id number] DOB: [birth year]   Cancelled treatment:       Reason Eval/Treat Not Completed: Medical issues which prohibited therapy;Patient not medically ready;[name] level of consciousness (chart reviewed)  Pt had a decline in medical status requiring emergent intubation and transfer to CCU. ST services will sign off at this time. MD to reconsult in future.     Comer Portugal, MS, CCC-SLP Speech Language Pathologist Rehab Services; [facility] [phone] (ascom) Darianny Momon 09/01/2024, 4:36 PM

## 2024-09-01 NOTE — Consult Note (Signed)
 NAME:  Haley Deleon, MRN:  [id number], DOB:  [birth year], LOS: 17 ADMISSION DATE:  08/15/2024, CONSULTATION DATE:  09/01/24 REFERRING MD:  Dr. Kandis, CHIEF COMPLAINT:  Acute Metabolic Encephalopathy   Brief Pt Description / Synopsis:  68 y.o. female with PMHx significant for alcohol abuse and HFpEF admitted with Acute Metabolic Encephalopathy in setting of UTI, Parainfluenza infection, left leg infection.  Course complicated by persistent Encephalopathy, recurrent fever, high risk for aspiration and possible need for intubation for airway protection.   History of Present Illness:  Haley Deleon is a 68 y.o. female with medical history significant of alcohol use disorder, dCHF, CAD, depression with anxiety, morbid obesity, ulcerative colitis, NSVT, PSVT, varicose vein in both legs, carotid artery stenosis, morbid obesity, who presents with left leg swelling and pain, confusion.   Patient is currently altered, therefore history is obtained from chart review.  It is reported patient initially presented to ED with left leg swelling and pain, and then she developed confusion in ED. When I saw patient in ED, she is alert, orientated to place and person, not to the time. She moves all extremities normally. No facial droop or slurred speech. She has bilateral lower leg swelling, the left leg is much worse than the right. She does not have pain in the right leg. She denies fall or injury. No fever or chills. She has a varicose vein in both legs. Patient denies chest pain, cough, SOB. No nausea, vomiting, diarrhea or abdominal pain. No symptoms UTI. Patient has candidiasis in groin areas. Per her granddaughter at the bedside, patient used to drink liquor almost every day. Recently she drinks less, and last drinking was several days ago. Patient states that she only drinks once a week recently.    ED Course: Initial Vital Signs: Temperature normal, blood pressure 159/99, heart rate of 107, RR 17, oxygen  saturation 100% on room air  Significant Labs: WBC 10.1, GFR> 60, UA (cloudy appearance, small amount of leukocyte, rare bacteria, WBC 21-50), ammonia 19, GFR> 60, negative UDS  Imaging CT Head>> negative for acute intracranial abnormalities   TRH asked to admit for further workup and treatment.  Please see Significant Hospital Events section below for full detailed hospital course.   Pertinent  Medical History   Past Medical History:  Diagnosis Date   Alcohol use disorder    Anemia    Atypical chest pain    Carotid arterial disease    a. 11/2017 Carotid U/S: RICA <50, LICA nl.   Claustrophobia    Coronary artery disease    Diastolic dysfunction    a. 11/2017 Echo: EF 60-65%, no rwma, Gr1 DD. Nl RV fxn.   Dysrhythmia    Family history of adverse reaction to anesthesia    a.) early emergence in 3rd degree relative (1st cousin)   Heart murmur    Liver cyst    Pneumonia    Ulcerative colitis (HCC)    Venous insufficiency     Micro Data:  11/26: Urine>> Proteus Mirabilis & E. Coli  11/27: HIV Screen>> nonreactive 11/30: COVID/FLU/RSV PCR>> negative 12/1: RVP >> + Parainfluenza 12/1: MRSA PCR + 12/11: RPR>> nonreactive  12/8: Blood cultures x2>> no growth to date 12/11: Blood cultures x2>> no growth to date    Antimicrobials:   Anti-infectives (From admission, onward)    Start     Dose/Rate Route Frequency Ordered Stop   08/31/24 1600  piperacillin-tazobactam (ZOSYN) IVPB 3.375 g  3.375 g 12.5 mL/hr over 240 Minutes Intravenous Every 8 hours 08/31/24 1447     08/22/24 0600  vancomycin  (VANCOREADY) IVPB 1500 mg/300 mL  Status:  Discontinued        1,500 mg 150 mL/hr over 120 Minutes Intravenous Every 24 hours 08/21/24 1312 08/23/24 1042   08/21/24 1130  vancomycin  (VANCOREADY) IVPB 2000 mg/400 mL        2,000 mg 200 mL/hr over 120 Minutes Intravenous  Once 08/21/24 1040 08/21/24 1426   08/20/24 1730  azithromycin  (ZITHROMAX ) tablet 500 mg  Status:   Discontinued        500 mg Oral Daily 08/20/24 1632 08/23/24 1042   08/16/24 0100  cefTRIAXone  (ROCEPHIN ) 2 g in sodium chloride  0.9 % 100 mL IVPB  Status:  Discontinued        2 g 200 mL/hr over 30 Minutes Intravenous Every 24 hours 08/16/24 0057 08/23/24 1042       Significant Hospital Events: Including procedures, antibiotic start and stop dates in addition to other pertinent events   11/25: admitted to hospitalist w/ acute metabolic encephalopathy EtOH withdrawal, UTI.  11/26: remains confused / hallucinating this morning. Continuing withdrawal protocols  11/27: remains confused but is more calm today. Urine culture still pending.  11/28: remains confused but is more calm today and bit more conversant but not making sense. Urine culture still pending - staff has confirmed w/ lab that it is in process today as of 05:00 11/29: more alert but remains confused. PT/OT ordered for tomorrow. UCX pend susceptibilities  11/30: Proteus and Ecoli both sensitive to rocephin . Febrile this AM so will leave on abx, CXR read as possible R mid-lung atelectasis / early infection, also vascular congestion. Gave dose Lasix  and will add azithro for possible PNA 12/01: continues to be febrile, more oriented but still confused / not conversational so difficult history. No skin lesions. Added vancomycin  given (+)MRSA screen and skin erythema groin, but likely d/t parainfluenza virus 12/02: febrile but improved, remains somewhat confused, continue abx 12/9: ID consulted for AMS and recurrent fevers after initial treatment of UTI and CAP. 12/10: Neurology consulted for AMS. defervescing. Some improvement Mental status. UA not a clean catch but mixed. Some cough today. MRI head neg. Neck is supple so doubt meningitis and the waxing and waning mental status would be unusual for bacterial meningitis  12/11: Afebrile, more alert but remains confused.  Coughing on exam, Unsuccessful LP attempt.  12/12: Remains  encephalopathic, high risk for aspiration.  PCCM consulted to evaluate ability to protect airway and possible need for intubation.  Palliative Care consulted.  Interim History / Subjective:  As outlined above under Significant Hospital Events section  Objective   Blood pressure 123/70, pulse 91, temperature 99.1 F (37.3 C), resp. rate 16, height 5' 7 (1.702 m), weight 111.7 kg, SpO2 98%.        Intake/Output Summary (Last 24 hours) at 09/01/2024 1253 Last data filed at 09/01/2024 0800 Gross per 24 hour  Intake --  Output 900 ml  Net -900 ml   Filed Weights   08/28/24 0500 08/30/24 0500 08/31/24 0424  Weight: 117.3 kg 113.9 kg 111.7 kg    Examination: General: Acute on chronically ill appearing female, sitting in bed, on room air, in NAD HENT: Atraumatic, normocephalic, neck supple, no JVD Lungs: Coarse breath sounds throughout, even, nonlabored Cardiovascular: RRR, s1s2, no M/R/G Abdomen: Obese, soft, nontender, nondistended, no guarding or rebound tenderness Extremities: Generalized weakness, BLE 3+ edema (L slightly >R)  with chronic tropic changes Neuro: Somnolent, withdraws and moans from painful stimuli, currently unable to follow commands, PERRL GU: External female catheter in place draining yellow urine   Resolved Hospital Problem list     Assessment & Plan:   #Acute Metabolic Encephalopathy #Alcohol Withdrawal PMHx: ETOH abuse, depression, anxiety MRI Brain 12/9 unremarkable LP attempted on 12/11 by IR but unsuccessful -Treatment of metabolic derangements as outlined below  -Provide supportive care -Promote normal sleep/wake cycle and family presence -Avoid sedating medications as able -B12, TSH, Folate, Ammonia are all normal -B1 pending  -Neurology following, appreciate input  -Continue high dose thiamine   -CURRENTLY PROTECTING HER AIRWAY, BUT HIGH RISK FOR ASPIRATION AND NEED FOR INTUBATION ~ Aspiration precautions  -Check ABG   #Recurrent  Fever #Questionable Aspiration  #UTI: Proteus & E. Coli ~ TREATED  #Parainfluenza infection #Left Leg infection -Monitor fever curve -Trend WBC's & Procalcitonin -Follow cultures as above -ID following, appreciate input ~ Continue empiric Zosyn pending cultures & sensitivities        Pt is critically ill with multiorgan failure. Prognosis is guarded, high risk for further decompensation, cardiac arrest, and death.  Given current critical illness superimposed on multiple chronic co-morbidities and advanced age, overall long term prognosis is poor.  Recommend consideration of DNR/DNI status.  Will consult Palliative Care to assist with GOC discussions.   Best Practice (right click and Reselect all SmartList Selections daily)   Diet/type: NPO until mental status improved  DVT prophylaxis: LMWH GI prophylaxis: N/A Lines: N/A Foley:  N/A Code Status:  full code Last date of multidisciplinary goals of care discussion [N/A]   Labs   CBC: Recent Labs  Lab 08/26/24 0832 08/27/24 0530 08/29/24 0434 09/01/24 0522  WBC 7.5 8.7 9.7 13.6*  NEUTROABS 5.0 5.9  --   --   HGB 14.2 15.0 14.7 14.9  HCT 42.7 46.9* 44.9 45.7  MCV 89.7 92.5 90.9 92.3  PLT 229 289 353 230    Basic Metabolic Panel: Recent Labs  Lab 08/26/24 0832 08/27/24 0530 08/29/24 0434 08/30/24 1045 09/01/24 0522  NA 139 139 146* 141 141  K 3.7 4.0 4.0 3.9 3.8  CL 106 104 108 105 107  CO2 23 22 22 22  21*  GLUCOSE 109* 122* 118* 150* 188*  BUN 16 15 16 20  28*  CREATININE 0.62 0.68 0.77 0.74 1.01*  CALCIUM 8.9 9.4 9.3 9.6 9.5   GFR: Estimated Creatinine Clearance: 69.6 mL/min (A) (by C-G formula based on SCr of 1.01 mg/dL (H)). Recent Labs  Lab 08/26/24 0832 08/27/24 0530 08/28/24 1340 08/29/24 0434 09/01/24 0522  PROCALCITON  --   --  0.10  --   --   WBC 7.5 8.7  --  9.7 13.6*    Liver Function Tests: Recent Labs  Lab 08/29/24 0434 08/30/24 1045 09/01/24 0522  AST 71* 64* 39  ALT 71* 73*  53*  ALKPHOS 109 112 115  BILITOT 0.7 1.0 1.0  PROT 7.1 7.5 7.6  ALBUMIN 3.5 3.5 3.6   No results for input(s): LIPASE, AMYLASE in the last 168 hours. Recent Labs  Lab 08/29/24 0434  AMMONIA 25    ABG No results found for: PHART, PCO2ART, PO2ART, HCO3, TCO2, ACIDBASEDEF, O2SAT   Coagulation Profile: No results for input(s): INR, PROTIME in the last 168 hours.  Cardiac Enzymes: No results for input(s): CKTOTAL, CKMB, CKMBINDEX, TROPONINI in the last 168 hours.  HbA1C: Hgb A1c MFr Bld  Date/Time Value Ref Range Status  02/24/2021 02:45 AM 5.3  4.8 - 5.6 % Final    Comment:    (NOTE)         Prediabetes: 5.7 - 6.4         Diabetes: >6.4         Glycemic control for adults with diabetes: <7.0     CBG: Recent Labs  Lab 08/27/24 2307  GLUCAP 112*    Review of Systems:   Unable to assess due to AMS  Past Medical History:  She,  has a past medical history of Alcohol use disorder, Anemia, Atypical chest pain, Carotid arterial disease, Claustrophobia, Coronary artery disease, Diastolic dysfunction, Dysrhythmia, Family history of adverse reaction to anesthesia, Heart murmur, Liver cyst, Pneumonia, Ulcerative colitis (HCC), and Venous insufficiency.   Surgical History:   Past Surgical History:  Procedure Laterality Date   BREAST BIOPSY Right 11/02/2011   ultrasound guided biopsy/clip-benign   CHOLECYSTECTOMY N/A 07/29/2017   Procedure: LAPAROSCOPIC CHOLECYSTECTOMY;  Surgeon: Jordis Laneta FALCON, MD;  Location: [facility] ORS;  Service: General;  Laterality: N/A;   COLON SURGERY  2014, 2015   DUE TO ULERATIVE COLITIS WITH COLOSTOMY   COLOSTOMY TAKEDOWN     FOOT SURGERY Bilateral    BONE SPURS    HYSTEROSCOPY WITH D & C N/A 09/11/2022   Procedure: FRACTIONAL DILATATION AND CURETTAGE /HYSTEROSCOPY;  Surgeon: Schermerhorn, Debby PARAS, MD;  Location: [facility] ORS;  Service: Gynecology;  Laterality: N/A;   MANDIBLE FRACTURE SURGERY  1991   MOUTH SURGERY      VENTRAL HERNIA REPAIR N/A 07/29/2017   Procedure: LAPAROSCOPIC VENTRAL HERNIA;  Surgeon: Jordis Laneta FALCON, MD;  Location: [facility] ORS;  Service: General;  Laterality: N/A;     Social History:   reports that she quit smoking about 19 years ago. Her smoking use included cigarettes. She started smoking about 39 years ago. She has a 20 pack-year smoking history. She has never used smokeless tobacco. She reports current alcohol use. She reports that she does not use drugs.   Family History:  Her family history includes Heart attack (age of onset: 55) in her mother; Hyperlipidemia in her brother. There is no history of Breast cancer.   Allergies Allergies[1]   Home Medications  Prior to Admission medications  Medication Sig Start Date End Date Taking? Authorizing Provider  escitalopram  (LEXAPRO ) 10 MG tablet Take 10 mg by mouth daily.   Yes [provider]  ferrous sulfate 325 (65 FE) MG tablet Take 325 mg by mouth daily as needed.    Yes [provider]  loperamide  (IMODIUM ) 2 MG capsule Take 2 mg as needed by mouth for diarrhea or loose stools (NORMALLY TAKES 2-3 TIMES DAILY SINCE HAVING HER COLON SURGERY).    Yes [provider]  Multiple Vitamin (MULTIVITAMIN) tablet Take 1 tablet by mouth daily.   Yes [provider]  gabapentin  (NEURONTIN ) 300 MG capsule Take 1 capsule by mouth at bedtime. Patient not taking: Reported on 08/16/2024    [provider]  hydrOXYzine  (ATARAX ) 10 MG tablet Take 10 mg by mouth 3 (three) times daily as needed for anxiety. Patient not taking: Reported on 08/16/2024    [provider]     Critical care time: 45 minutes     Inge Lecher, AGACNP-BC [facility] Prefer epic messenger for cross cover needs If after hours, please call E-link       [1]  Allergies Allergen Reactions   Caffeine Anaphylaxis and Other (See Comments)   Chocolate Anaphylaxis   Chocolate  Flavoring Agent  (Non-Screening) Anaphylaxis   Cocoa Anaphylaxis   Meloxicam Other (See Comments)    migraine and bleeding  Severe headache  meloxicam

## 2024-09-01 NOTE — Procedures (Signed)
 Central Venous Catheter Insertion Procedure Note  Haley Deleon  [id number]  [birth year]  Date:09/01/2024  Time:5:09 PM   Provider Performing:[name]   Procedure: Insertion of Non-tunneled Central Venous Catheter(36556) with US  guidance (23062)   Indication(s) Medication administration and Difficult access  Consent Unable to obtain consent due to emergent nature of procedure.  Anesthesia Topical only with 1% lidocaine    Timeout Verified patient identification, verified procedure, site/side was marked, verified correct patient position, special equipment/implants available, medications/allergies/relevant history reviewed, required imaging and test results available.  Sterile Technique Maximal sterile technique including full sterile barrier drape, hand hygiene, sterile gown, sterile gloves, mask, hair covering, sterile ultrasound probe cover (if used).  Procedure Description Area of catheter insertion was cleaned with chlorhexidine  and draped in sterile fashion.  With real-time ultrasound guidance a central venous catheter was placed into the left internal jugular vein. Nonpulsatile blood flow and easy flushing noted in all ports.  The catheter was sutured in place and sterile dressing applied.  Complications/Tolerance None; patient tolerated the procedure well. Chest X-ray is ordered to verify placement for internal jugular or subclavian cannulation.   Chest x-ray is not ordered for femoral cannulation.  EBL Minimal  Specimen(s) None    Line inserted to the 20 cm mark.   Haley Deleon, AGACNP-BC [facility] Prefer epic messenger for cross cover needs If after hours, please call E-link

## 2024-09-01 NOTE — Procedures (Signed)
 Bronchoscopy Procedure Note  Haley Deleon  [id number]  04/05/1956  Date:09/01/2024  Time:4:38 PM   Provider Performing:Siddhanth Denk   Procedure(s):  Initial Therapeutic Aspiration of Tracheobronchial Tree [phone])  Indication(s) Respiratory failure, aspiration of airway secretions  Consent Unable to obtain consent due to emergent nature of procedure.  Anesthesia Etomidate, Rocuronium    Time Out Verified patient identification, verified procedure, site/side was marked, verified correct patient position, special equipment/implants available, medications/allergies/relevant history reviewed, required imaging and test results available.   Sterile Technique Usual hand hygiene, masks, gowns, and gloves were used   Procedure Description Bronchoscope advanced through endotracheal tube and into airway.  Airways were examined down to subsegmental level with findings noted below.   Following diagnostic evaluation, washings were obtained from the right tracheobronchial tree. Therapeutic aspiration performed throughout the tracheobronchial tree and copious secretions cleared from the airway.  Findings: Copious thick secretions throughout the tracheobronchial tree, therapeutically aspirated. Washings obtained from    Complications/Tolerance None; patient tolerated the procedure well. Chest X-ray is needed post procedure.   EBL Minimal   Specimen(s) Bronchial washing.  Haley November, MD [facility] 09/01/2024 4:39 PM

## 2024-09-01 NOTE — Progress Notes (Signed)
 Physical Therapy Treatment Patient Details Name: Haley Deleon MRN: [id number] DOB: Dec 28, 1955 Today's Date: 09/01/2024   History of Present Illness Pt is a 68 y.o. female who presents with left leg swelling and pain, confusion. MD assessment: Acute metabolic encephalopathy, UTI and possible alcohol withdrawal. PMH of alcohol use disorder, dCHF, CAD, depression with anxiety, morbid obesity, ulcerative colitis, NSVT, PSVT, varicose vein in both legs, carotid artery stenosis, morbid obesity    PT Comments  Pt received upright in bed. In obtunded state. PT/OT session focused on repositioning. Pt minimally responsive to multiple sternal rubs. Opens eyese briefly and groans with LE PROM at hips and ankles to assess for joint contractures. Notable L bicep muscle contracture with hard end feel. Multiple minutes spent attempting to stretch and reposition with minimal change in ROM. TotalA+2 to scoot up in bed. Placed head in neutral position via pillows due to L sided cervical muscular tightness lending to maintained L side bending. Pt's waxing and waning mentation/alertness greatly limiting prognosis in functional capacity for skilled PT/OT treatment and OOB mobility in general especially with multiple developing joint contractures. Pt may be inappropriate for acute PT rehab. Need to re-assess next session and potentially sign off if pt unable to participate due to complexity of medical condition. Pt left in care of NT.     If plan is discharge home, recommend the following: Two people to help with walking and/or transfers;Two people to help with bathing/dressing/bathroom;Help with stairs or ramp for entrance;Assist for transportation;Assistance with feeding;Assistance with cooking/housework;Direct supervision/assist for financial management;Supervision due to cognitive status   Can travel by private vehicle     No  Equipment Recommendations  Other (comment) (TBD)    Recommendations for Other Services        Precautions / Restrictions Precautions Precautions: Fall Recall of Precautions/Restrictions: Impaired Restrictions Weight Bearing Restrictions Per Provider Order: No     Mobility  Bed Mobility               General bed mobility comments: unable to participate    Transfers                   General transfer comment: unable    Ambulation/Gait                   Stairs             Wheelchair Mobility     Tilt Bed    Modified Rankin (Stroke Patients Only)       Balance       Sitting balance - Comments: unable       Standing balance comment: unable in current condition                            Communication Communication Communication: Impaired Factors Affecting Communication: Reduced clarity of speech  Cognition Arousal: Obtunded Behavior During Therapy: Flat affect                             Following commands: Impaired Following commands impaired: Follows one step commands inconsistently    Cueing Cueing Techniques: Verbal cues, Tactile cues, Visual cues, Gestural cues  Exercises Other Exercises Other Exercises: L bicep stretching: 3-4 minutes total    General Comments General comments (skin integrity, edema, etc.): positioned with neutral head position in bed via pillows. Notable L bicep contracture.      Pertinent Vitals/Pain  Pain Assessment Pain Assessment: Faces Faces Pain Scale: Hurts even more Pain Location: generalized. Appears mostly to LE mobility Pain Descriptors / Indicators: Discomfort, Grimacing Pain Intervention(s): Monitored during session, Repositioned    Home Living                          Prior Function            PT Goals (current goals can now be found in the care plan section) Acute Rehab PT Goals PT Goal Formulation: Patient unable to participate in goal setting    Frequency    Min 2X/week      PT Plan      Co-evaluation PT/OT/SLP  Co-Evaluation/Treatment: Yes Reason for Co-Treatment: Complexity of the patient's impairments (multi-system involvement);Necessary to address cognition/behavior during functional activity;For patient/therapist safety PT goals addressed during session: Mobility/safety with mobility;Strengthening/ROM OT goals addressed during session: ADL's and self-care;Proper use of Adaptive equipment and DME;Strengthening/ROM      AM-PAC PT 6 Clicks Mobility   Outcome Measure  Help needed turning from your back to your side while in a flat bed without using bedrails?: Total Help needed moving from lying on your back to sitting on the side of a flat bed without using bedrails?: Total Help needed moving to and from a bed to a chair (including a wheelchair)?: Total Help needed standing up from a chair using your arms (e.g., wheelchair or bedside chair)?: Total Help needed to walk in hospital room?: Total Help needed climbing 3-5 steps with a railing? : Total 6 Click Score: 6    End of Session         PT Visit Diagnosis: Muscle weakness (generalized) (M62.81);History of falling (Z91.81);Difficulty in walking, not elsewhere classified (R26.2);Other symptoms and signs involving the nervous system (R29.898)     Time: 1417-1440 PT Time Calculation (min) (ACUTE ONLY): 23 min  Charges:    $Therapeutic Activity: 8-22 mins PT General Charges $$ ACUTE PT VISIT: 1 Visit                    Dorina HERO. Fairly IV, PT, DPT Physical Therapist- [place]  [facility] 09/01/2024, 3:48 PM

## 2024-09-01 NOTE — Progress Notes (Signed)
 Occupational Therapy Treatment Patient Details Name: Haley Deleon MRN: [id number] DOB: November 10, 1955 Today's Date: 09/01/2024   History of present illness Pt is a 68 y.o. female who presents with left leg swelling and pain, confusion. MD assessment: Acute metabolic encephalopathy, UTI and possible alcohol withdrawal. PMH of alcohol use disorder, dCHF, CAD, depression with anxiety, morbid obesity, ulcerative colitis, NSVT, PSVT, varicose vein in both legs, carotid artery stenosis, morbid obesity   OT comments  Patient seen for OT/PT cotreatment on this date. Upon arrival to room patient asleep and unresponsive to noxious stimul- sternal rubbing, deep massage, nailbed pressure and cold washcloth all with minimal response from patient. Patient presents with labored mouth breathing; attempted to reposition with minimal engagement (eyes open briefly), prolonged stretching of BUE for contracture prevention and pericare performed. Patient not responding well to therapy, may be appropriate for signing off if tx attempts present the same.        If plan is discharge home, recommend the following:  Two people to help with walking and/or transfers;A lot of help with bathing/dressing/bathroom;Two people to help with bathing/dressing/bathroom   Equipment Recommendations  Other (comment)    Recommendations for Other Services      Precautions / Restrictions Precautions Precautions: Fall Recall of Precautions/Restrictions: Impaired Restrictions Weight Bearing Restrictions Per Provider Order: No       Mobility Bed Mobility               General bed mobility comments: unable to participate    Transfers                   General transfer comment: unable     Balance       Sitting balance - Comments: unable                                   ADL either performed or assessed with clinical judgement   ADL Overall ADL's : Needs assistance/impaired                                        General ADL Comments: patient unrseponsive to noxious stimuli this tx    Extremity/Trunk Assessment Upper Extremity Assessment Upper Extremity Assessment: RUE deficits/detail;LUE deficits/detail RUE Deficits / Details: increased tone in bicep/pec LUE Deficits / Details: increased tone in bicep/pec            Vision       Perception     Praxis     Communication Communication Communication: Impaired Factors Affecting Communication: Reduced clarity of speech   Cognition Arousal: Obtunded Behavior During Therapy: Flat affect Cognition: Cognition impaired                               Following commands: Impaired Following commands impaired: Follows one step commands inconsistently      Cueing   Cueing Techniques: Verbal cues, Tactile cues, Visual cues, Gestural cues  Exercises      Shoulder Instructions       General Comments      Pertinent Vitals/ Pain       Pain Assessment Pain Assessment: Faces Faces Pain Scale: Hurts little more Pain Location: generalized. APpears mostly to LE mobility Pain Intervention(s): Monitored during session  Home Living  Prior Functioning/Environment              Frequency  Min 1X/week        Progress Toward Goals  OT Goals(current goals can now be found in the care plan section)  Progress towards OT goals: Not progressing toward goals - comment (unable to participate)  Acute Rehab OT Goals OT Goal Formulation: Patient unable to participate in goal setting Time For Goal Achievement: 09/15/24 Potential to Achieve Goals: Poor ADL Goals Pt Will Perform Eating: bed level;with mod assist Pt Will Perform Grooming: bed level;with max assist Pt/caregiver will Perform Home Exercise Program: Increased strength;Increased ROM;Both right and left upper extremity;With minimal assist  Plan      Co-evaluation     PT/OT/SLP Co-Evaluation/Treatment: Yes Reason for Co-Treatment: Complexity of the patient's impairments (multi-system involvement);Necessary to address cognition/behavior during functional activity;For patient/therapist safety PT goals addressed during session: Mobility/safety with mobility;Strengthening/ROM OT goals addressed during session: ADL's and self-care;Proper use of Adaptive equipment and DME;Strengthening/ROM      AM-PAC OT 6 Clicks Daily Activity     Outcome Measure   Help from another person eating meals?: A Lot Help from another person taking care of personal grooming?: A Lot Help from another person toileting, which includes using toliet, bedpan, or urinal?: Total Help from another person bathing (including washing, rinsing, drying)?: Total Help from another person to put on and taking off regular upper body clothing?: A Lot Help from another person to put on and taking off regular lower body clothing?: Total 6 Click Score: 9    End of Session    OT Visit Diagnosis: Other abnormalities of gait and mobility (R26.89);Muscle weakness (generalized) (M62.81)   Activity Tolerance Treatment limited secondary to medical complications (Comment)   Patient Left in bed;with call bell/phone within reach;with bed alarm set;with nursing/sitter in room   Nurse Communication Other (comment) (not able to arouse)        Time: 1419-1440 OT Time Calculation (min): 21 min  Charges: OT General Charges $OT Visit: 1 Visit  Rogers Clause, OT/L MSOT, 09/01/2024

## 2024-09-01 NOTE — Progress Notes (Addendum)
 Subjective: Patient remains poorly responsive.  Must be constantly re-alerted.  Coughing.  Eventually required intubation and now in ICU.  Objective: Current vital signs: BP 127/85 (BP Location: Left Arm)   Pulse 90   Temp 99.1 F (37.3 C)   Resp 16   Ht 5' 7 (1.702 m)   Wt 111.7 kg   SpO2 95%   BMI 38.57 kg/m  Vital signs in last 24 hours: Temp:  [99.1 F (37.3 C)] 99.1 F (37.3 C) (12/12 1421) Pulse Rate:  [90-91] 90 (12/12 1421) Resp:  [16] 16 (12/12 1421) BP: (123-127)/(70-85) 127/85 (12/12 1421) SpO2:  [95 %-98 %] 95 % (12/12 1421)  Intake/Output from previous day: No intake/output data recorded. Intake/Output this shift: Total I/O In: -  Out: 900 [Urine:900] Nutritional status:  Diet Order             Diet NPO time specified  Diet effective now                   Neurologic Exam: ( prior to ICU transfer) Mental Status: Lethargic but able to be aroused.  Follows some simple commands.  Speech minimal  and dysarthric.  Cranial Nerves: II: Blinks to bilateral confrontation III,IV, VI: intact oculocephalic response V,VII: face symmetric Motor: Moves all extremities weakly but symmetrical with no focal weakness appreciated.  Sensory: Pinprick and light touch intact throughout, bilaterally.  Localizes to pain.    Lab Results: Basic Metabolic Panel: Recent Labs  Lab 08/26/24 0832 08/27/24 0530 08/29/24 0434 08/30/24 1045 09/01/24 0522  NA 139 139 146* 141 141  K 3.7 4.0 4.0 3.9 3.8  CL 106 104 108 105 107  CO2 23 22 22 22  21*  GLUCOSE 109* 122* 118* 150* 188*  BUN 16 15 16 20  28*  CREATININE 0.62 0.68 0.77 0.74 1.01*  CALCIUM 8.9 9.4 9.3 9.6 9.5    Liver Function Tests: Recent Labs  Lab 08/29/24 0434 08/30/24 1045 09/01/24 0522  AST 71* 64* 39  ALT 71* 73* 53*  ALKPHOS 109 112 115  BILITOT 0.7 1.0 1.0  PROT 7.1 7.5 7.6  ALBUMIN 3.5 3.5 3.6   No results for input(s): LIPASE, AMYLASE in the last 168 hours. Recent Labs  Lab  08/29/24 0434  AMMONIA 25    CBC: Recent Labs  Lab 08/26/24 0832 08/27/24 0530 08/29/24 0434 09/01/24 0522  WBC 7.5 8.7 9.7 13.6*  NEUTROABS 5.0 5.9  --   --   HGB 14.2 15.0 14.7 14.9  HCT 42.7 46.9* 44.9 45.7  MCV 89.7 92.5 90.9 92.3  PLT 229 289 353 230    Cardiac Enzymes: No results for input(s): CKTOTAL, CKMB, CKMBINDEX, TROPONINI in the last 168 hours.  Lipid Panel: No results for input(s): CHOL, TRIG, HDL, CHOLHDL, VLDL, LDLCALC in the last 168 hours.  CBG: Recent Labs  Lab 08/27/24 2307 09/01/24 1637  GLUCAP 112* 229*    Microbiology: Results for orders placed or performed during the hospital encounter of 08/15/24  Urine Culture (for pregnant, neutropenic or urologic patients or patients with an indwelling urinary catheter)     Status: Abnormal   Collection Time: 08/16/24  5:07 AM   Specimen: Urine, Clean Catch  Result Value Ref Range Status   Specimen Description   Final    URINE, CLEAN CATCH Performed at [facility], [address] [place], [place] [address]    Special Requests   Final    NONE Performed at [facility], [address]  [place], [place] [address]    Culture (A)  Final    30,000 COLONIES/mL PROTEUS MIRABILIS >=100,000 COLONIES/mL ESCHERICHIA COLI    Report Status 08/20/2024 FINAL  Final   Organism ID, Bacteria PROTEUS MIRABILIS (A)  Final   Organism ID, Bacteria ESCHERICHIA COLI (A)  Final      Susceptibility   Escherichia coli - MIC*    AMPICILLIN >=32 RESISTANT Resistant     CEFAZOLIN  (URINE) Value in next row Sensitive      2 SENSITIVEThis is a modified FDA-approved test that has been validated and its performance characteristics determined by the reporting laboratory.  This laboratory is certified under the Clinical Laboratory Improvement Amendments CLIA as qualified to perform high complexity clinical laboratory testing.    CEFEPIME Value in next row Sensitive      2 SENSITIVEThis is a  modified FDA-approved test that has been validated and its performance characteristics determined by the reporting laboratory.  This laboratory is certified under the Clinical Laboratory Improvement Amendments CLIA as qualified to perform high complexity clinical laboratory testing.    ERTAPENEM Value in next row Sensitive      2 SENSITIVEThis is a modified FDA-approved test that has been validated and its performance characteristics determined by the reporting laboratory.  This laboratory is certified under the Clinical Laboratory Improvement Amendments CLIA as qualified to perform high complexity clinical laboratory testing.    CEFTRIAXONE  Value in next row Sensitive      2 SENSITIVEThis is a modified FDA-approved test that has been validated and its performance characteristics determined by the reporting laboratory.  This laboratory is certified under the Clinical Laboratory Improvement Amendments CLIA as qualified to perform high complexity clinical laboratory testing.    CIPROFLOXACIN Value in next row Sensitive      2 SENSITIVEThis is a modified FDA-approved test that has been validated and its performance characteristics determined by the reporting laboratory.  This laboratory is certified under the Clinical Laboratory Improvement Amendments CLIA as qualified to perform high complexity clinical laboratory testing.    GENTAMICIN Value in next row Sensitive      2 SENSITIVEThis is a modified FDA-approved test that has been validated and its performance characteristics determined by the reporting laboratory.  This laboratory is certified under the Clinical Laboratory Improvement Amendments CLIA as qualified to perform high complexity clinical laboratory testing.    NITROFURANTOIN Value in next row Sensitive      2 SENSITIVEThis is a modified FDA-approved test that has been validated and its performance characteristics determined by the reporting laboratory.  This laboratory is certified under the  Clinical Laboratory Improvement Amendments CLIA as qualified to perform high complexity clinical laboratory testing.    TRIMETH/SULFA Value in next row Sensitive      2 SENSITIVEThis is a modified FDA-approved test that has been validated and its performance characteristics determined by the reporting laboratory.  This laboratory is certified under the Clinical Laboratory Improvement Amendments CLIA as qualified to perform high complexity clinical laboratory testing.    AMPICILLIN/SULBACTAM Value in next row Sensitive      2 SENSITIVEThis is a modified FDA-approved test that has been validated and its performance characteristics determined by the reporting laboratory.  This laboratory is certified under the Clinical Laboratory Improvement Amendments CLIA as qualified to perform high complexity clinical laboratory testing.    PIP/TAZO Value in next row Sensitive      <=4 SENSITIVEThis is a modified FDA-approved test that has been validated and its performance characteristics determined by the  reporting laboratory.  This laboratory is certified under the Clinical Laboratory Improvement Amendments CLIA as qualified to perform high complexity clinical laboratory testing.    MEROPENEM Value in next row Sensitive      <=4 SENSITIVEThis is a modified FDA-approved test that has been validated and its performance characteristics determined by the reporting laboratory.  This laboratory is certified under the Clinical Laboratory Improvement Amendments CLIA as qualified to perform high complexity clinical laboratory testing.    * >=100,000 COLONIES/mL ESCHERICHIA COLI   Proteus mirabilis - MIC*    AMPICILLIN Value in next row Sensitive      <=4 SENSITIVEThis is a modified FDA-approved test that has been validated and its performance characteristics determined by the reporting laboratory.  This laboratory is certified under the Clinical Laboratory Improvement Amendments CLIA as qualified to perform high complexity  clinical laboratory testing.    CEFAZOLIN  (URINE) Value in next row Sensitive      8 SENSITIVEThis is a modified FDA-approved test that has been validated and its performance characteristics determined by the reporting laboratory.  This laboratory is certified under the Clinical Laboratory Improvement Amendments CLIA as qualified to perform high complexity clinical laboratory testing.    CEFEPIME Value in next row Sensitive      8 SENSITIVEThis is a modified FDA-approved test that has been validated and its performance characteristics determined by the reporting laboratory.  This laboratory is certified under the Clinical Laboratory Improvement Amendments CLIA as qualified to perform high complexity clinical laboratory testing.    ERTAPENEM Value in next row Sensitive      8 SENSITIVEThis is a modified FDA-approved test that has been validated and its performance characteristics determined by the reporting laboratory.  This laboratory is certified under the Clinical Laboratory Improvement Amendments CLIA as qualified to perform high complexity clinical laboratory testing.    CEFTRIAXONE  Value in next row Sensitive      8 SENSITIVEThis is a modified FDA-approved test that has been validated and its performance characteristics determined by the reporting laboratory.  This laboratory is certified under the Clinical Laboratory Improvement Amendments CLIA as qualified to perform high complexity clinical laboratory testing.    CIPROFLOXACIN Value in next row Sensitive      8 SENSITIVEThis is a modified FDA-approved test that has been validated and its performance characteristics determined by the reporting laboratory.  This laboratory is certified under the Clinical Laboratory Improvement Amendments CLIA as qualified to perform high complexity clinical laboratory testing.    GENTAMICIN Value in next row Sensitive      8 SENSITIVEThis is a modified FDA-approved test that has been validated and its performance  characteristics determined by the reporting laboratory.  This laboratory is certified under the Clinical Laboratory Improvement Amendments CLIA as qualified to perform high complexity clinical laboratory testing.    NITROFURANTOIN Value in next row Resistant      8 SENSITIVEThis is a modified FDA-approved test that has been validated and its performance characteristics determined by the reporting laboratory.  This laboratory is certified under the Clinical Laboratory Improvement Amendments CLIA as qualified to perform high complexity clinical laboratory testing.    TRIMETH/SULFA Value in next row Sensitive      8 SENSITIVEThis is a modified FDA-approved test that has been validated and its performance characteristics determined by the reporting laboratory.  This laboratory is certified under the Clinical Laboratory Improvement Amendments CLIA as qualified to perform high complexity clinical laboratory testing.    AMPICILLIN/SULBACTAM Value in next row Sensitive  8 SENSITIVEThis is a modified FDA-approved test that has been validated and its performance characteristics determined by the reporting laboratory.  This laboratory is certified under the Clinical Laboratory Improvement Amendments CLIA as qualified to perform high complexity clinical laboratory testing.    PIP/TAZO Value in next row Sensitive      <=4 SENSITIVEThis is a modified FDA-approved test that has been validated and its performance characteristics determined by the reporting laboratory.  This laboratory is certified under the Clinical Laboratory Improvement Amendments CLIA as qualified to perform high complexity clinical laboratory testing.    MEROPENEM Value in next row Sensitive      <=4 SENSITIVEThis is a modified FDA-approved test that has been validated and its performance characteristics determined by the reporting laboratory.  This laboratory is certified under the Clinical Laboratory Improvement Amendments CLIA as qualified to  perform high complexity clinical laboratory testing.    * 30,000 COLONIES/mL PROTEUS MIRABILIS  Resp panel by RT-PCR (RSV, Flu A&B, Covid) Anterior Nasal Swab     Status: None   Collection Time: 08/20/24  5:34 PM   Specimen: Anterior Nasal Swab  Result Value Ref Range Status   SARS Coronavirus 2 by RT PCR NEGATIVE NEGATIVE Final    Comment: (NOTE) SARS-CoV-2 target nucleic acids are NOT DETECTED.  The SARS-CoV-2 RNA is generally detectable in upper respiratory specimens during the acute phase of infection. The lowest concentration of SARS-CoV-2 viral copies this assay can detect is 138 copies/mL. A negative result does not preclude SARS-Cov-2 infection and should not be used as the sole basis for treatment or other patient management decisions. A negative result may occur with  improper specimen collection/handling, submission of specimen other than nasopharyngeal swab, presence of viral mutation(s) within the areas targeted by this assay, and inadequate number of viral copies(<138 copies/mL). A negative result must be combined with clinical observations, patient history, and epidemiological information. The expected result is Negative.  Fact Sheet for Patients:  bloggercourse.com  Fact Sheet for Healthcare Providers:  seriousbroker.it  This test is no t yet approved or cleared by the [place]  FDA and  has been authorized for detection and/or diagnosis of SARS-CoV-2 by FDA under an Emergency Use Authorization (EUA). This EUA will remain  in effect (meaning this test can be used) for the duration of the COVID-19 declaration under Section 564(b)(1) of the Act, 21 U.S.C.section 360bbb-3(b)(1), unless the authorization is terminated  or revoked sooner.       Influenza A by PCR NEGATIVE NEGATIVE Final   Influenza B by PCR NEGATIVE NEGATIVE Final    Comment: (NOTE) The Xpert Xpress SARS-CoV-2/FLU/RSV plus assay is intended as  an aid in the diagnosis of influenza from Nasopharyngeal swab specimens and should not be used as a sole basis for treatment. Nasal washings and aspirates are unacceptable for Xpert Xpress SARS-CoV-2/FLU/RSV testing.  Fact Sheet for Patients: bloggercourse.com  Fact Sheet for Healthcare Providers: seriousbroker.it  This test is not yet approved or cleared by the [place]  FDA and has been authorized for detection and/or diagnosis of SARS-CoV-2 by FDA under an Emergency Use Authorization (EUA). This EUA will remain in effect (meaning this test can be used) for the duration of the COVID-19 declaration under Section 564(b)(1) of the Act, 21 U.S.C. section 360bbb-3(b)(1), unless the authorization is terminated or revoked.     Resp Syncytial Virus by PCR NEGATIVE NEGATIVE Final    Comment: (NOTE) Fact Sheet for Patients: bloggercourse.com  Fact Sheet for Healthcare Providers: seriousbroker.it  This test  is not yet approved or cleared by the [place]  FDA and has been authorized for detection and/or diagnosis of SARS-CoV-2 by FDA under an Emergency Use Authorization (EUA). This EUA will remain in effect (meaning this test can be used) for the duration of the COVID-19 declaration under Section 564(b)(1) of the Act, 21 U.S.C. section 360bbb-3(b)(1), unless the authorization is terminated or revoked.  Performed at [facility], [address] [place], [place] [address]   Respiratory (~20 pathogens) panel by PCR     Status: Abnormal   Collection Time: 08/21/24  8:23 AM   Specimen: Nasopharyngeal Swab; Respiratory  Result Value Ref Range Status   Adenovirus NOT DETECTED NOT DETECTED Final   Coronavirus 229E NOT DETECTED NOT DETECTED Final    Comment: (NOTE) The Coronavirus on the Respiratory Panel, DOES NOT test for the novel  Coronavirus (2019 nCoV)     Coronavirus HKU1 NOT DETECTED NOT DETECTED Final   Coronavirus NL63 NOT DETECTED NOT DETECTED Final   Coronavirus OC43 NOT DETECTED NOT DETECTED Final   Metapneumovirus NOT DETECTED NOT DETECTED Final   Rhinovirus / Enterovirus NOT DETECTED NOT DETECTED Final   Influenza A NOT DETECTED NOT DETECTED Final   Influenza B NOT DETECTED NOT DETECTED Final   Parainfluenza Virus 1 DETECTED (A) NOT DETECTED Final   Parainfluenza Virus 2 NOT DETECTED NOT DETECTED Final   Parainfluenza Virus 3 NOT DETECTED NOT DETECTED Final   Parainfluenza Virus 4 NOT DETECTED NOT DETECTED Final   Respiratory Syncytial Virus NOT DETECTED NOT DETECTED Final   Bordetella pertussis NOT DETECTED NOT DETECTED Final   Bordetella Parapertussis NOT DETECTED NOT DETECTED Final   Chlamydophila pneumoniae NOT DETECTED NOT DETECTED Final   Mycoplasma pneumoniae NOT DETECTED NOT DETECTED Final    Comment: Performed at [facility], [address], [place], [place] [address]  MRSA Next Gen by PCR, Nasal     Status: Abnormal   Collection Time: 08/21/24  8:23 AM   Specimen: Nasal Mucosa; Nasal Swab  Result Value Ref Range Status   MRSA by PCR Next Gen DETECTED (A) NOT DETECTED Final    Comment: RESULT CALLED TO, READ BACK BY AND VERIFIED WITH: ALLIE T. RN 1032 08/21/24 HNM (NOTE) The GeneXpert MRSA Assay (FDA approved for NASAL specimens only), is one component of a comprehensive MRSA colonization surveillance program. It is not intended to diagnose MRSA infection nor to guide or monitor treatment for MRSA infections. Test performance is not FDA approved in patients less than 68 years old. Performed at [facility], [address] [place], [place] [address]   Culture, blood (Routine X 2) w Reflex to ID Panel     Status: None (Preliminary result)   Collection Time: 08/28/24  1:40 PM   Specimen: BLOOD  Result Value Ref Range Status   Specimen Description BLOOD BLOOD RIGHT HAND  Final   Special Requests    Final    BOTTLES DRAWN AEROBIC AND ANAEROBIC Blood Culture adequate volume   Culture   Final    NO GROWTH 4 DAYS Performed at [facility], [address], [place], [place] [address]    Report Status PENDING  Incomplete  Culture, blood (Routine X 2) w Reflex to ID Panel     Status: None (Preliminary result)   Collection Time: 08/28/24  1:40 PM   Specimen: BLOOD  Result Value Ref Range Status   Specimen Description BLOOD BLOOD LEFT HAND  Final   Special Requests  Final    BOTTLES DRAWN AEROBIC AND ANAEROBIC Blood Culture adequate volume   Culture   Final    NO GROWTH 4 DAYS Performed at [facility], [address] [place], [place] [address]    Report Status PENDING  Incomplete  Culture, blood (Routine X 2) w Reflex to ID Panel     Status: None (Preliminary result)   Collection Time: 08/31/24  1:28 PM   Specimen: BLOOD  Result Value Ref Range Status   Specimen Description BLOOD BLOOD LEFT HAND  Final   Special Requests   Final    BOTTLES DRAWN AEROBIC AND ANAEROBIC Blood Culture adequate volume   Culture   Final    NO GROWTH < 24 HOURS Performed at [facility], [address] [place], [place] [address]    Report Status PENDING  Incomplete  Culture, blood (Routine X 2) w Reflex to ID Panel     Status: None (Preliminary result)   Collection Time: 08/31/24  1:28 PM   Specimen: BLOOD  Result Value Ref Range Status   Specimen Description BLOOD BLOOD RIGHT HAND  Final   Special Requests   Final    BOTTLES DRAWN AEROBIC AND ANAEROBIC Blood Culture adequate volume   Culture   Final    NO GROWTH < 24 HOURS Performed at [facility], [address] [place], [place] [address]    Report Status PENDING  Incomplete    Coagulation Studies: No results for input(s): LABPROT, INR in the last 72 hours.  Imaging: DG Chest Port 1 View Result Date: 09/01/2024 CLINICAL DATA:  Aspiration EXAM: PORTABLE CHEST 1 VIEW COMPARISON:  Three days ago  FINDINGS: Stable cardiomediastinal silhouette. Both lungs are clear. Old left rib fractures. IMPRESSION: No active disease. Electronically Signed   By: Lynwood Landy Raddle M.D.   On: 09/01/2024 14:40   CT CHEST ABDOMEN PELVIS W CONTRAST Result Date: 08/31/2024 CLINICAL DATA:  Fever of unknown origin. EXAM: CT CHEST, ABDOMEN, AND PELVIS WITH CONTRAST TECHNIQUE: Multidetector CT imaging of the chest, abdomen and pelvis was performed following the standard protocol during bolus administration of intravenous contrast. RADIATION DOSE REDUCTION: This exam was performed according to the departmental dose-optimization program which includes automated exposure control, adjustment of the mA and/or kV according to patient size and/or use of iterative reconstruction technique. CONTRAST:  OMNIPAQUE  IOHEXOL  300 MG/ML  SOLN COMPARISON:  Chest CTA on 03/05/2022, and AP CT on 02/27/2014 FINDINGS: CT CHEST FINDINGS Cardiovascular: No acute findings. Mediastinum/Lymph Nodes: No masses or pathologically enlarged lymph nodes identified. Lungs/Pleura: No suspicious pulmonary nodules or masses identified. No evidence of infiltrate or pleural effusion. Musculoskeletal: No suspicious bone lesions identified. Several old left lateral rib fracture deformities are noted. CT ABDOMEN AND PELVIS FINDINGS Hepatobiliary: No masses identified. Small cyst in the inferior right hepatic lobe is again noted. Gallbladder is unremarkable. No evidence of biliary ductal dilatation. Pancreas:  No mass or inflammatory changes. Spleen:  Within normal limits in size and appearance. Adrenals/Urinary tract: No No evidence of adrenal or renal masses. Severe right hydronephrosis and proximal ureterectasis is again seen with transition to nondilated ureter at the level of the crossing iliac vessels. No obstructing ureteral calculus or mass identified. Mild left hydronephrosis and proximal ureterectasis is new since 2023 exam, without No evidence of obstructing  calculus or mass. This may be due to postoperative scarring related to previous colectomy and ileorectal anastomosis. Urinary bladder appears tethered anteriorly and superiorly, likely due to postop scarring, but is  otherwise unremarkable. Stomach/Bowel: Postoperative changes are again seen from subtotal colectomy with ileorectal anastomosis. A small well-circumscribed gas collection is seen in the adjacent presacral region measuring 2.4 x 2.0 cm, where a percutaneous drainage catheter was previously located. No residual fluid or acute inflammatory changes identified. No evidence of bowel obstruction. Vascular/Lymphatic: No pathologically enlarged lymph nodes identified. No acute vascular findings. Reproductive:  No mass or other significant abnormality identified. Other:  None. Musculoskeletal:  No suspicious bone lesions identified. IMPRESSION: No evidence of acute inflammatory process or abscess. Stable severe right hydronephrosis and proximal ureterectasis, and new mild left hydronephrosis and proximal ureterectasis. No obstructing calculus or mass identified. This is likely due to postop scarring related to previous bowel surgery. Decreased size of small presacral gas collection, where a percutaneous drainage catheter was previously located. This is also likely postop in etiology. Electronically Signed   By: Norleen DELENA Kil M.D.   On: 08/31/2024 17:53   DG FL GUIDED LUMBAR PUNCTURE Result Date: 08/31/2024 CLINICAL DATA:  68 year old female with a history of alcohol use disorder who presented to the ED with lower extremity swelling and confusion. Currently admitted with acute metabolic encephalopathy of unclear origin and being treated for alcohol withdrawal, UTI, parainfluenza, and left leg infection. Request for lumbar puncture. EXAM: LUMBAR PUNCTURE UNDER FLUOROSCOPY PROCEDURE: An appropriate skin entry site was determined fluoroscopically. Operator donned sterile gloves and mask. Skin site was marked, then  prepped with Betadine , draped in usual sterile fashion, and infiltrated locally with 1% lidocaine . A 20 gauge 6-inch spinal needle advanced into the thecal sac at L5-S1 from a right interlaminar approach. Blood was noted to the hub of the needle; this was cleaned out and needle placement adjusted sightly, but again only blood returned into the hub. A second attempt was made at the same level with a new needle, but only a small amount of blood returned into the hub. A third attempt to was made with a new 20 gauge 6-inch spinal needle and advanced into the thecal sac at L3-4 from a right interlaminar approach. Initially a small amount of clear colorless CSF spontaneously returned, but was quickly followed by bloody output in the hub. This eventually clotted and obstructed the flow of CSF. An attempt was made to clean the needle out and reposition, but only a small amount of bloody return noted in the hub. Dr. Marcelino was then called into the room for additional assistance. A fourth and final attempt was made with a new 20 gauge 6-inch spinal needle at the L4-L5 level from a right interlaminar approach. Unfortunately, despite reassuring imaging, there was no return of CSF. The procedure was aborted and the needle was then removed. The patient tolerated the procedure well and there were no complications. FLUOROSCOPY: Radiation Exposure Index (as provided by the fluoroscopic device): 165.5 mGy Kerma IMPRESSION: Technically unsuccessful lumbar puncture under fluoroscopy with multiple attempts at repositioning of the spinal needle. No CSF obtained. This exam was performed by [facility] PA-C, and was supervised and interpreted by Dr. Harrietta Marcelino. Electronically Signed   By: Harrietta Marcelino M.D.   On: 08/31/2024 16:42    Medications: I have reviewed the patient's current medications. Scheduled:  Chlorhexidine  Gluconate Cloth  6 each Topical Daily   [START ON 09/02/2024] enoxaparin  (LOVENOX ) injection  60 mg  Subcutaneous Q24H   escitalopram   10 mg Oral Daily   etomidate       feeding supplement  237 mL Oral BID BM   folic acid   1  mg Oral Daily   multivitamin with minerals  1 tablet Oral Daily   nystatin    Topical BID   mouth rinse  15 mL Mouth Rinse 4 times per day   rocuronium         Assessment/Plan: 68 y.o. female who due to mental status is unable to provide any history therefore all history obtained from the chart.   Patient with medical history significant of alcohol use disorder, dCHF, CAD, depression with anxiety, morbid obesity, ulcerative colitis, NSVT, PSVT, varicose vein in both legs, carotid artery stenosis, morbid obesity, who presented initially with left leg swelling and pain.  Developed confusion in the ED.  Initially was not febrile but developed fevers while hospitalized.  Was found to have a UTI and there was infection of the left leg as well.  There was question of PNA but this has been ruled out.  Positive for Parainfluenza virus.   Placed on CIWA protocol on admission.  Has been on Zithromycin, Ceftriaxone , Vancomycin  during her stay.  Currently afrebile and with normal wbc count.  Altered mental status has continued although somewhat better today.   Etiology of altered mental status likely multifactorial and related to infection and ETOH withdrawal. MRI of the brain unremarkable. B12, TSH, folate are normal.  B1 pending.  Has received multiple doses of high dose thiamine  with no benefit.  Although this may still be from her ETOH withdrawal, can not rule out a partially treated meningitis.  No evidence of a post viral encephalitis on imaging.  LP attempted but unable to be performed by fluoro.   EEG pending  Recommendations: Discussion to be had  with ID about the need to cover for partially treated meningitis since LP unable to be performed and patient continues to decompensate.   May discontinue high dose Thiamine  supplementation  Case discussed with Dr. Kandis   LOS: 17  days   Sonny Hock, MD Neurology  09/01/2024  4:41 PM

## 2024-09-01 NOTE — Progress Notes (Addendum)
 Pharmacy Antibiotic Note  Haley Deleon is a 68 y.o. female admitted on 08/15/2024 with pneumonia. PMH significant for alcohol use disorder, dCHF, CAD, depression, anxiety. Pharmacy has been consulted for Vancomycin  dosing.  12/12: [name] Scr is slightly elevated from baseline at 1.01 (BL 0.6-0.9). WBC 13.6 and patient is afebrile. Will continue to monitor.  Plan: - Will order Vancomycin  2500 mg IV loading dose x 1 - Will start Vancomycin  maintenance regimen at 1250 mg IV q24h (eAUC 466. Cmin 11.1, Scr 1.01, IBW used, Vd 0.5 L/kg since BMI > 35) - Goal AUC 400-550 - Patient also receiving Zosyn 3.375 g IV q8h - Will continue to monitor renal function and signs of clinical improvement.   Height: 5' 7 (170.2 cm) Weight: 111.7 kg (246 lb 4.1 oz) IBW/kg (Calculated) : 61.6  Temp (24hrs), Avg:99.1 F (37.3 C), Min:99.1 F (37.3 C), Max:99.1 F (37.3 C)  Recent Labs  Lab 08/26/24 0832 08/27/24 0530 08/29/24 0434 08/30/24 1045 09/01/24 0522  WBC 7.5 8.7 9.7  --  13.6*  CREATININE 0.62 0.68 0.77 0.74 1.01*    Estimated Creatinine Clearance: 69.6 mL/min (A) (by C-G formula based on SCr of 1.01 mg/dL (H)).    Allergies[1]  Antimicrobials this admission: Zosyn 12/11 >> Vancomycin  12/12 >>  Dose adjustments this admission:   Microbiology results: 12/1 Resp panel: parainfluenza virus 1  12/1 MRSA PCR: detected  12/8 BCx: NGTD 12/11 Bcx: NGTD 12/11 UCx: ordered  12/12 RespCx: sent 12/12 FungalCx: sent  12/12 MRSA PCR: ordered  Thank you for allowing pharmacy to be a part of this patients care.   Ransom Blanch PGY-1 Pharmacy Resident  [place] [facility]  09/01/2024 6:09 PM     [1]  Allergies Allergen Reactions   Caffeine Anaphylaxis and Other (See Comments)   Chocolate Anaphylaxis   Chocolate Flavoring Agent (Non-Screening) Anaphylaxis   Cocoa Anaphylaxis   Meloxicam Other (See Comments)    migraine and bleeding  Severe headache  meloxicam

## 2024-09-01 NOTE — TOC Progression Note (Signed)
 Transition of Care [facility]) - Progression Note    Patient Details  Name: Haley Deleon MRN: [id number] Date of Birth: Dec 10, 1955  Transition of [facility]) CM/SW Contact  Corean ONEIDA Haddock, RN Phone Number: 09/01/2024, 11:13 AM  Clinical Narrative:     Reported minimally responsive and not appropriate for POs.  Recommended Palliative consult to medical team                    Expected Discharge Plan and Services                                               Social Drivers of Health (SDOH) Interventions SDOH Screenings   Food Insecurity: No Food Insecurity (08/21/2024)  Housing: Unknown (08/25/2024)  Transportation Needs: No Transportation Needs (08/21/2024)  Utilities: Not At Risk (08/21/2024)  Social Connections: Moderately Isolated (08/22/2024)  Tobacco Use: Medium Risk (08/15/2024)    Readmission Risk Interventions     No data to display

## 2024-09-01 NOTE — Procedures (Signed)
 Intubation Procedure Note  AJLA MCGEACHY  [id number]  10-23-1955  Date:09/01/2024  Time:4:38 PM   Provider Performing:Zacherie Honeyman    Procedure: Intubation (31500)  Indication(s) Respiratory Failure  Consent Unable to obtain consent due to emergent nature of procedure.   Anesthesia Etomidate and Rocuronium    Time Out Verified patient identification, verified procedure, site/side was marked, verified correct patient position, special equipment/implants available, medications/allergies/relevant history reviewed, required imaging and test results available.   Sterile Technique Usual hand hygeine, masks, and gloves were used   Procedure Description Patient positioned in bed supine.  Sedation given as noted above.  Patient was intubated with endotracheal tube using Glidescope.  View was Grade 2 only posterior commissure .  Number of attempts was 1.  Colorimetric CO2 detector was consistent with tracheal placement.   Complications/Tolerance None; patient tolerated the procedure well. Chest X-ray is ordered to verify placement.   EBL Minimal   Specimen(s) None  [name], MD [facility] 09/01/2024 4:38 PM

## 2024-09-01 NOTE — Plan of Care (Signed)

## 2024-09-01 NOTE — Progress Notes (Signed)
°   09/01/24 1600  Spiritual Encounters  Type of Visit Initial  Care provided to: [facility] partners present during encounter Nurse  Referral source Code page  Reason for visit Code   Chaplain responded to a RRT in room 211.  Chaplain found patient's partner in the hallway outside of the room preparing to be moved to the ICU.  Patient had already been rolled away.  [name] partner had the belongings and Chaplain escorted him.  Partner spoke of concern for his girlfriend all evening and his strong faith.  Chaplain offered presence and nourishment but partner declined.  Partner made phone calls so Chaplain excused herself, let staff know he was there and let overnight Chaplain know, as well.    Rev. Rana M. Nicholaus, M.Div. Chaplain Resident [facility]

## 2024-09-01 NOTE — Progress Notes (Signed)
 Initial Nutrition Assessment  DOCUMENTATION CODES:   Obesity unspecified  INTERVENTION:   -Continue NPO status -Case discussed with RN, MD, SLP, and TOC; recommending palliative care consult for goals of care. If patient unable to take PO's or goals of care remains aggressive, recommend placement of feeding tube for nutrition (recommendations discussed with team):   Initiate Osmolite 1.5 @ 20 ml/hr and increase by 10 ml every 12 hours to goal rate of 60 ml/hr.   60 ml Prosource TF20  Tube feeding regimen provides 2260 kcal (100% of needs), 130 grams of protein, and 1097 ml of H2O.    -If feedings are pursued, recommend: 1 mg folic acid  daily, MVI with minerals daily, 100 mg thiamine  daily, and monitor Mg, K, and Phos and replete as needed secondary to high refeeding risk   NUTRITION DIAGNOSIS:   Inadequate oral intake related to inability to eat, dysphagia as evidenced by NPO status.  GOAL:   Patient will meet greater than or equal to 90% of their needs  MONITOR:   Diet advancement  REASON FOR ASSESSMENT:   Consult Assessment of nutrition requirement/status  ASSESSMENT:   68 y.o. female with medical history significant of alcohol use disorder, dCHF, CAD, depression with anxiety, morbid obesity, ulcerative colitis, NSVT, PSVT, varicose vein in both legs, carotid artery stenosis, morbid obesity, who presents with left leg swelling and pain, confusion.  Patient admitted with acute metabolic encephalopathy, UTI, history of alcohol abuse and possible alcohol withdrawal, and pain and swelling in left lower leg.   12/8- per notes, became obtunded 12/10- high dose thiamine  started by neurology 12/11- s/p BSE- NPO, lumbar puncture attempts unsuccessful  12/12- s/p BSE- NPO  Reviewed I/O's: 0 ml x 24 hours and -2.8 L since 08/18/24  Per neurology notes, etiology of altered mental status likely multifactorial (infection and ETOH withdrawal), which withdrawal likely being the  largest contributing factor. High dose thiamine  started on 08/30/24. MRI of the brain was unremarkable.   Per PCCM notes, patient is encephalopathic, difficult to arouse but withdraws to pain. SHe is currently protecting her airways, but is at high risk for aspiration. Plan to consider EEG. Lumbar puncture to be re attempted in 48 hours.   Patient lying in bed at time of visit. No family present. Patient snoring loudly during visit and did not arouse to voice or touch.   Case discussed with RN, MD, TOC, and SLP. Patient is NPO and unsafe for any oral intake. Plan to follow-up within the next few days to assess for possible diet upgrade. Discussed concern over prolonged hospital stay, lethargy, and NPO status. Palliative care has been consulted for goals of care discussions. RD discussed concern over lack of nutrition and recommendations for feeding tube is goals of care are aggressive or patient unable to advance to a PO diet within the next few days.   Reviewed weight history. Weight have ranged from 114-118 kg within the past week. Patient has also experienced a 9% weight loss over the past 3 months, which is significant for time frame. Patient with moderate edema on exam, which is likely masking further weight loss as well as potential fat and muscle depletions.   Per TOC notes, plan for SNF placement at discharge.   Medications reviewed and include folic acid , MVI, and IV thiamine .   Labs reviewed.   NUTRITION - FOCUSED PHYSICAL EXAM:  Flowsheet Row Most Recent Value  Orbital Region No depletion  Upper Arm Region No depletion  Thoracic and Lumbar Region  No depletion  Buccal Region Mild depletion  Temple Region Mild depletion  Clavicle Bone Region No depletion  Clavicle and Acromion Bone Region No depletion  Scapular Bone Region No depletion  Dorsal Hand Mild depletion  Patellar Region No depletion  Anterior Thigh Region No depletion  Posterior Calf Region No depletion  Edema (RD  Assessment) Moderate  Hair Reviewed  Eyes Reviewed  Mouth Reviewed  Skin Reviewed  Nails Reviewed    Diet Order:   Diet Order             Diet NPO time specified  Diet effective now                   EDUCATION NEEDS:   Not appropriate for education at this time  Skin:  Skin Assessment: Reviewed RN Assessment  Last BM:  09/01/24 (type 6)  Height:   Ht Readings from Last 1 Encounters:  08/15/24 5' 7 (1.702 m)    Weight:   Wt Readings from Last 1 Encounters:  08/31/24 111.7 kg    Ideal Body Weight:  59.1 kg  BMI:  Body mass index is 38.57 kg/m.  Estimated Nutritional Needs:   Kcal:  2150-2350  Protein:  120-135 grams  Fluid:  2.0-2.2 L    Margery ORN, RD, LDN, CDCES Registered Dietitian III Certified Diabetes Care and Education Specialist If unable to reach this RD, please use RD Inpatient group chat on secure chat between hours of 8am-4 pm daily

## 2024-09-01 NOTE — Significant Event (Addendum)
 Rapid responses for respiratory distress. Responded immediately to bedside and patient found to have agonal breathing, mottled skin. Nonrebreather ordered, ICU team made aware of need for emergent intubation, and the patient was transferred to the ICU where preparations for immediate intubation were underway. Care transferred to ICU team who know the patient (consulted on he earlier in the day). Notifed next-of-kin (granddaughter [name]) who is near the hospital and will arrive shortly.

## 2024-09-01 NOTE — Progress Notes (Signed)
 SLP Cancellation Note  Patient Details Name: Haley Deleon MRN: [id number] DOB: 1956-02-06   Cancelled treatment:       Reason Eval/Treat Not Completed: Medical issues which prohibited therapy;[name] level of consciousness;Patient not medically ready (chart reviewed; consulted NSG and MD re: pt's status today)  Upon attempt to see pt at bedside for a swallowing evaluation, noted pt w/ reduced responsiveness and heavy snoring. MOD-MAX tactile stim w/ oral care given - she reacted w/ tonic bite and withdrawal to the stim and the oral care given. She exhibited a junky, wet cough w/ a slow, nonvolitional swallow response to the Phlegm. NSG reported recent suctioning (little response to the stim also).   Pt is Not appropriate nor safe for oral intake/po trials at this time d/t poor alertness/awareness. Recommend strict NPO status w/ alternative means for Medsications. ST services will f/u next 1-2 days to check for appropriate State/status for BSE/po intake. Recommend frequent oral care for hygiene and stimulation of swallowing. Palliative Care order requested. Dietician, NSG, and MD updated.        Comer Portugal, MS, CCC-SLP Speech Language Pathologist Rehab Services; [facility] [phone] (ascom) Maralee Higuchi 09/01/2024, 2:43 PM

## 2024-09-01 NOTE — Progress Notes (Signed)
 PROGRESS NOTE   HPI was taken from Dr. Hilma: Haley Deleon is a 68 y.o. female with medical history significant of alcohol use disorder, dCHF, CAD, depression with anxiety, morbid obesity, ulcerative colitis, NSVT, PSVT, varicose vein in both legs, carotid artery stenosis, morbid obesity, who presents with left leg swelling and pain, confusion.   Per ED physician, patient initially presented to ED with left leg swelling and pain, and then she developed confusion in ED.  When I saw patient in ED, she is alert, orientated to place and person, not to the time.  She moves all extremities normally.  No facial droop or slurred speech.  She has bilateral lower leg swelling, the left leg is much worse than the right.  She does not have pain in the right leg.  She denies fall or injury.  No fever or chills.  She has a varicose vein in both legs.  Patient denies chest pain, cough, SOB.  No nausea, vomiting, diarrhea or abdominal pain.  No symptoms UTI.  Patient has candidiasis in groin areas. Per her granddaughter at the bedside, patient used to drink liquor almost every day. Recently she drinks less, and last drinking was several days ago.  Patient states that she only drinks once a week recently.   Data reviewed independently and ED Course: pt was found to have WBC 10.1, GFR> 60, UA (cloudy appearance, small amount of leukocyte, rare bacteria, WBC  21-50), ammonia 19, GFR> 60, negative UDS.  Temperature normal, blood pressure 159/99, heart rate of 107, RR 17, oxygen saturation 100% on room air.  CT of head negative for acute intracranial abnormalities.  Left lower extremity venous Doppler negative for DVT.  Patient is admitted to PCU as inpatient.     Haley Deleon  FMW:[id number] DOB: 10-08-1955 DOA: 08/15/2024 PCP: Ricard Tawni KIDD, MD    Assessment & Plan:   Principal Problem:   Acute metabolic encephalopathy Active Problems:   UTI (urinary tract infection)   Alcohol withdrawal (HCC)   Pain  and swelling of left lower leg   Chronic diastolic CHF (congestive heart failure) (HCC)   Ulcerative colitis (HCC)   Intertriginous candidiasis   Depression   Obesity, Class III, BMI 40-49.9 (morbid obesity) (HCC)  Assessment and Plan:  Acute metabolic encephalopathy: etiolgoy unclear. Treated for withdrawal, uti, parainfluenza, left leg infection. Id and neuro following. Non-con mri negative. Repeat blood cultures drawn, ngtd. Ct c/a/p shows nothing acute. LP attempted on 12/11 but dry tap. Hydrating, may need to attempt repeat LP when better hydrated. Has been receiving high-dose thiamine . Currently on zosyn for possible microaspiration, nothing acute seen on CT. Will ask pccm to evaluate patient today, given poor. Will also consult palliative as long term prognosis poor at this point. Relayed this poor prognosis to grandaughter, she is re-considering code status but full code for now. Will also need to address feeding  UTI: urine cx grew proteus and e. coli. Completed abx course.   Parainfluenza virus: continue w/ supportive care   Fever: see above for broadened w/u  Hypernatremia: resolved  Groin candida: continue on nystatin    Chronic venous stasis: w/ pain and swelling of LLE. US  of LLE was neg for DVT    Chronic diastolic CHF: echo on 02/24/2021 showed EF 60 to 65%. Appears compensated. Monitor I/Os  Depression: severity unknown. Continue on home dose of lexapro    Obesity: BMI 39.3. Complicates overall care & prognosis     DVT prophylaxis: Lovenox  Code Status: full  Family Communication: discussed pt's care w/ pt's granddaughter, [name], 12/12 Disposition Plan: SNF?  Level of care: Med-Surg  Status is: Inpatient Remains inpatient appropriate because: remains acutely ill    Consultants:  ID Neuro  PCCM   Subjective: Lethargic, snoring, doesn't rouse  Objective: Vitals:   08/31/24 0353 08/31/24 0424 08/31/24 0732 09/01/24 0831  BP: 116/70  118/83 123/70   Pulse: 100  (!) 49 91  Resp: 18  18 16   Temp: 99.8 F (37.7 C)  99.9 F (37.7 C) 99.1 F (37.3 C)  TempSrc: Axillary     SpO2: 95%  97% 98%  Weight:  111.7 kg    Height:        Intake/Output Summary (Last 24 hours) at 09/01/2024 1246 Last data filed at 09/01/2024 0800 Gross per 24 hour  Intake --  Output 900 ml  Net -900 ml    Filed Weights   08/28/24 0500 08/30/24 0500 08/31/24 0424  Weight: 117.3 kg 113.9 kg 111.7 kg    Examination:  General exam: chronically ill appearing Respiratory system: diminished breath sounds b/l  Cardiovascular system: S1/S2+. No rubs or gallops Gastrointestinal system: abd is soft, NT, obese & hypoactive bowel sounds Central nervous system: snoring, does not rouse Ext: left leg swelling greater than right Psychiatry: calm   Data Reviewed: I have personally reviewed following labs and imaging studies  CBC: Recent Labs  Lab 08/26/24 0832 08/27/24 0530 08/29/24 0434 09/01/24 0522  WBC 7.5 8.7 9.7 13.6*  NEUTROABS 5.0 5.9  --   --   HGB 14.2 15.0 14.7 14.9  HCT 42.7 46.9* 44.9 45.7  MCV 89.7 92.5 90.9 92.3  PLT 229 289 353 230   Basic Metabolic Panel: Recent Labs  Lab 08/26/24 0832 08/27/24 0530 08/29/24 0434 08/30/24 1045 09/01/24 0522  NA 139 139 146* 141 141  K 3.7 4.0 4.0 3.9 3.8  CL 106 104 108 105 107  CO2 23 22 22 22  21*  GLUCOSE 109* 122* 118* 150* 188*  BUN 16 15 16 20  28*  CREATININE 0.62 0.68 0.77 0.74 1.01*  CALCIUM 8.9 9.4 9.3 9.6 9.5   GFR: Estimated Creatinine Clearance: 69.6 mL/min (A) (by C-G formula based on SCr of 1.01 mg/dL (H)). Liver Function Tests: Recent Labs  Lab 08/29/24 0434 08/30/24 1045 09/01/24 0522  AST 71* 64* 39  ALT 71* 73* 53*  ALKPHOS 109 112 115  BILITOT 0.7 1.0 1.0  PROT 7.1 7.5 7.6  ALBUMIN 3.5 3.5 3.6   No results for input(s): LIPASE, AMYLASE in the last 168 hours. Recent Labs  Lab 08/29/24 0434  AMMONIA 25   Coagulation Profile: No results for input(s):  INR, PROTIME in the last 168 hours. Cardiac Enzymes: No results for input(s): CKTOTAL, CKMB, CKMBINDEX, TROPONINI in the last 168 hours. BNP (last 3 results) Recent Labs    08/15/24 2238  PROBNP 483.0*   HbA1C: No results for input(s): HGBA1C in the last 72 hours. CBG: Recent Labs  Lab 08/27/24 2307  GLUCAP 112*   Lipid Profile: No results for input(s): CHOL, HDL, LDLCALC, TRIG, CHOLHDL, LDLDIRECT in the last 72 hours. Thyroid Function Tests: Recent Labs    08/30/24 1759  TSH 2.430   Anemia Panel: Recent Labs    08/30/24 1759  VITAMINB12 377  FOLATE >20.0   Sepsis Labs: Recent Labs  Lab 08/28/24 1340  PROCALCITON 0.10    Recent Results (from the past 240 hours)  Culture, blood (Routine X 2) w Reflex to ID Panel  Status: None (Preliminary result)   Collection Time: 08/28/24  1:40 PM   Specimen: BLOOD  Result Value Ref Range Status   Specimen Description BLOOD BLOOD RIGHT HAND  Final   Special Requests   Final    BOTTLES DRAWN AEROBIC AND ANAEROBIC Blood Culture adequate volume   Culture   Final    NO GROWTH 4 DAYS Performed at [facility], [address], [place], [place] [address]    Report Status PENDING  Incomplete  Culture, blood (Routine X 2) w Reflex to ID Panel     Status: None (Preliminary result)   Collection Time: 08/28/24  1:40 PM   Specimen: BLOOD  Result Value Ref Range Status   Specimen Description BLOOD BLOOD LEFT HAND  Final   Special Requests   Final    BOTTLES DRAWN AEROBIC AND ANAEROBIC Blood Culture adequate volume   Culture   Final    NO GROWTH 4 DAYS Performed at [facility], [address] [place], [place] [address]    Report Status PENDING  Incomplete  Culture, blood (Routine X 2) w Reflex to ID Panel     Status: None (Preliminary result)   Collection Time: 08/31/24  1:28 PM   Specimen: BLOOD  Result Value Ref Range Status   Specimen Description BLOOD BLOOD LEFT HAND  Final    Special Requests   Final    BOTTLES DRAWN AEROBIC AND ANAEROBIC Blood Culture adequate volume   Culture   Final    NO GROWTH < 24 HOURS Performed at [facility], [address], [place], [place] [address]    Report Status PENDING  Incomplete  Culture, blood (Routine X 2) w Reflex to ID Panel     Status: None (Preliminary result)   Collection Time: 08/31/24  1:28 PM   Specimen: BLOOD  Result Value Ref Range Status   Specimen Description BLOOD BLOOD RIGHT HAND  Final   Special Requests   Final    BOTTLES DRAWN AEROBIC AND ANAEROBIC Blood Culture adequate volume   Culture   Final    NO GROWTH < 24 HOURS Performed at [facility], [address], [place], [place] [address]    Report Status PENDING  Incomplete         Radiology Studies: CT CHEST ABDOMEN PELVIS W CONTRAST Result Date: 08/31/2024 CLINICAL DATA:  Fever of unknown origin. EXAM: CT CHEST, ABDOMEN, AND PELVIS WITH CONTRAST TECHNIQUE: Multidetector CT imaging of the chest, abdomen and pelvis was performed following the standard protocol during bolus administration of intravenous contrast. RADIATION DOSE REDUCTION: This exam was performed according to the departmental dose-optimization program which includes automated exposure control, adjustment of the mA and/or kV according to patient size and/or use of iterative reconstruction technique. CONTRAST:  OMNIPAQUE  IOHEXOL  300 MG/ML  SOLN COMPARISON:  Chest CTA on 03/05/2022, and AP CT on 02/27/2014 FINDINGS: CT CHEST FINDINGS Cardiovascular: No acute findings. Mediastinum/Lymph Nodes: No masses or pathologically enlarged lymph nodes identified. Lungs/Pleura: No suspicious pulmonary nodules or masses identified. No evidence of infiltrate or pleural effusion. Musculoskeletal: No suspicious bone lesions identified. Several old left lateral rib fracture deformities are noted. CT ABDOMEN AND PELVIS FINDINGS Hepatobiliary: No masses identified. Small cyst in the  inferior right hepatic lobe is again noted. Gallbladder is unremarkable. No evidence of biliary ductal dilatation. Pancreas:  No mass or inflammatory changes. Spleen:  Within normal limits in size and appearance. Adrenals/Urinary tract: No No evidence of adrenal or renal masses. Severe right  hydronephrosis and proximal ureterectasis is again seen with transition to nondilated ureter at the level of the crossing iliac vessels. No obstructing ureteral calculus or mass identified. Mild left hydronephrosis and proximal ureterectasis is new since 2023 exam, without No evidence of obstructing calculus or mass. This may be due to postoperative scarring related to previous colectomy and ileorectal anastomosis. Urinary bladder appears tethered anteriorly and superiorly, likely due to postop scarring, but is otherwise unremarkable. Stomach/Bowel: Postoperative changes are again seen from subtotal colectomy with ileorectal anastomosis. A small well-circumscribed gas collection is seen in the adjacent presacral region measuring 2.4 x 2.0 cm, where a percutaneous drainage catheter was previously located. No residual fluid or acute inflammatory changes identified. No evidence of bowel obstruction. Vascular/Lymphatic: No pathologically enlarged lymph nodes identified. No acute vascular findings. Reproductive:  No mass or other significant abnormality identified. Other:  None. Musculoskeletal:  No suspicious bone lesions identified. IMPRESSION: No evidence of acute inflammatory process or abscess. Stable severe right hydronephrosis and proximal ureterectasis, and new mild left hydronephrosis and proximal ureterectasis. No obstructing calculus or mass identified. This is likely due to postop scarring related to previous bowel surgery. Decreased size of small presacral gas collection, where a percutaneous drainage catheter was previously located. This is also likely postop in etiology. Electronically Signed   By: Norleen DELENA Kil M.D.    On: 08/31/2024 17:53   DG FL GUIDED LUMBAR PUNCTURE Result Date: 08/31/2024 CLINICAL DATA:  68 year old female with a history of alcohol use disorder who presented to the ED with lower extremity swelling and confusion. Currently admitted with acute metabolic encephalopathy of unclear origin and being treated for alcohol withdrawal, UTI, parainfluenza, and left leg infection. Request for lumbar puncture. EXAM: LUMBAR PUNCTURE UNDER FLUOROSCOPY PROCEDURE: An appropriate skin entry site was determined fluoroscopically. Operator donned sterile gloves and mask. Skin site was marked, then prepped with Betadine , draped in usual sterile fashion, and infiltrated locally with 1% lidocaine . A 20 gauge 6-inch spinal needle advanced into the thecal sac at L5-S1 from a right interlaminar approach. Blood was noted to the hub of the needle; this was cleaned out and needle placement adjusted sightly, but again only blood returned into the hub. A second attempt was made at the same level with a new needle, but only a small amount of blood returned into the hub. A third attempt to was made with a new 20 gauge 6-inch spinal needle and advanced into the thecal sac at L3-4 from a right interlaminar approach. Initially a small amount of clear colorless CSF spontaneously returned, but was quickly followed by bloody output in the hub. This eventually clotted and obstructed the flow of CSF. An attempt was made to clean the needle out and reposition, but only a small amount of bloody return noted in the hub. Dr. Marcelino was then called into the room for additional assistance. A fourth and final attempt was made with a new 20 gauge 6-inch spinal needle at the L4-L5 level from a right interlaminar approach. Unfortunately, despite reassuring imaging, there was no return of CSF. The procedure was aborted and the needle was then removed. The patient tolerated the procedure well and there were no complications. FLUOROSCOPY: Radiation Exposure  Index (as provided by the fluoroscopic device): 165.5 mGy Kerma IMPRESSION: Technically unsuccessful lumbar puncture under fluoroscopy with multiple attempts at repositioning of the spinal needle. No CSF obtained. This exam was performed by [facility] PA-C, and was supervised and interpreted by Dr. Harrietta Marcelino. Electronically Signed   By:  Harrietta Sherry M.D.   On: 08/31/2024 16:42        Scheduled Meds:  [START ON 09/02/2024] enoxaparin  (LOVENOX ) injection  60 mg Subcutaneous Q24H   escitalopram   10 mg Oral Daily   feeding supplement  237 mL Oral BID BM   folic acid   1 mg Oral Daily   multivitamin with minerals  1 tablet Oral Daily   nystatin    Topical BID   mouth rinse  15 mL Mouth Rinse 4 times per day   Continuous Infusions:  dextrose  5 % and 0.45 % NaCl 125 mL/hr at 08/31/24 2125   piperacillin-tazobactam (ZOSYN)  IV 3.375 g (09/01/24 0907)   thiamine  (VITAMIN B1) injection 500 mg (09/01/24 0554)      LOS: 17 days   CRITICAL CARE Performed by: Devaughn KATHEE Ban   Total critical care time: 55 minutes  Critical care time was exclusive of separately billable procedures and treating other patients.  Critical care was necessary to treat or prevent imminent or life-threatening deterioration.  Critical care was time spent personally by me on the following activities: development of treatment plan with patient and/or surrogate as well as nursing, discussions with consultants, evaluation of patient's response to treatment, examination of patient, obtaining history from patient or surrogate, ordering and performing treatments and interventions, ordering and review of laboratory studies, ordering and review of radiographic studies, pulse oximetry and re-evaluation of patient's condition.     Devaughn KATHEE Ban, MD [facility]   If 7PM-7AM, please contact night-coverage [email or web address] 09/01/2024, 12:46 PM

## 2024-09-01 NOTE — Progress Notes (Signed)
 Went into patients room, family at bedside talking to the patient. This nurse noticed her hands turning blue and having agonal breathing. A rapid response was called and patient was immediately taken to the ICU. Went to the ICU to give report to the nurse taking over. Patient family escorted to the ICU waiting room.

## 2024-09-01 NOTE — Progress Notes (Signed)
 Eeg done

## 2024-09-01 NOTE — Procedures (Signed)
 Line in-waiting for xray-then I can do eeg.

## 2024-09-02 ENCOUNTER — Inpatient Hospital Stay

## 2024-09-02 DIAGNOSIS — N179 Acute kidney failure, unspecified: Secondary | ICD-10-CM

## 2024-09-02 DIAGNOSIS — A419 Sepsis, unspecified organism: Secondary | ICD-10-CM

## 2024-09-02 DIAGNOSIS — E872 Acidosis, unspecified: Secondary | ICD-10-CM

## 2024-09-02 DIAGNOSIS — R569 Unspecified convulsions: Secondary | ICD-10-CM

## 2024-09-02 DIAGNOSIS — M6282 Rhabdomyolysis: Secondary | ICD-10-CM

## 2024-09-02 DIAGNOSIS — R6521 Severe sepsis with septic shock: Secondary | ICD-10-CM

## 2024-09-02 DIAGNOSIS — R4182 Altered mental status, unspecified: Secondary | ICD-10-CM

## 2024-09-02 DIAGNOSIS — J9601 Acute respiratory failure with hypoxia: Secondary | ICD-10-CM

## 2024-09-02 LAB — COMPREHENSIVE METABOLIC PANEL WITH GFR
ALT: 89 U/L — ABNORMAL HIGH (ref 0–44)
AST: 61 U/L — ABNORMAL HIGH (ref 15–41)
Albumin: 3.3 g/dL — ABNORMAL LOW (ref 3.5–5.0)
Alkaline Phosphatase: 146 U/L — ABNORMAL HIGH (ref 38–126)
Anion gap: 15 (ref 5–15)
BUN: 33 mg/dL — ABNORMAL HIGH (ref 8–23)
CO2: 19 mmol/L — ABNORMAL LOW (ref 22–32)
Calcium: 9.3 mg/dL (ref 8.9–10.3)
Chloride: 104 mmol/L (ref 98–111)
Creatinine, Ser: 1.86 mg/dL — ABNORMAL HIGH (ref 0.44–1.00)
GFR, Estimated: 29 mL/min — ABNORMAL LOW (ref >60)
Glucose, Bld: 249 mg/dL — ABNORMAL HIGH (ref 70–99)
Potassium: 3.5 mmol/L (ref 3.5–5.1)
Sodium: 138 mmol/L (ref 135–145)
Total Bilirubin: 0.9 mg/dL (ref 0.0–1.2)
Total Protein: 7.1 g/dL (ref 6.5–8.1)

## 2024-09-02 LAB — CBC
HCT: 45.6 % (ref 36.0–46.0)
Hemoglobin: 14.4 g/dL (ref 12.0–15.0)
MCH: 29.7 pg (ref 26.0–34.0)
MCHC: 31.6 g/dL (ref 30.0–36.0)
MCV: 94 fL (ref 80.0–100.0)
Platelets: 229 K/uL (ref 150–400)
RBC: 4.85 MIL/uL (ref 3.87–5.11)
RDW: 13.2 % (ref 11.5–15.5)
WBC: 34.2 K/uL — ABNORMAL HIGH (ref 4.0–10.5)
nRBC: 0 % (ref 0.0–0.2)

## 2024-09-02 LAB — URINALYSIS, COMPLETE (UACMP) WITH MICROSCOPIC
Bilirubin Urine: NEGATIVE
Glucose, UA: NEGATIVE mg/dL
Ketones, ur: NEGATIVE mg/dL
Leukocytes,Ua: NEGATIVE
Nitrite: NEGATIVE
Protein, ur: 30 mg/dL — AB
Specific Gravity, Urine: 1.027 (ref 1.005–1.030)
pH: 5 (ref 5.0–8.0)

## 2024-09-02 LAB — BLOOD GAS, ARTERIAL
Acid-base deficit: 6.6 mmol/L — ABNORMAL HIGH (ref 0.0–2.0)
Bicarbonate: 18.3 mmol/L — ABNORMAL LOW (ref 20.0–28.0)
FIO2: 30 %
MECHVT: 450 mL
Mechanical Rate: 15
O2 Saturation: 97.8 %
PEEP: 5 cmH2O
Patient temperature: 37
pCO2 arterial: 34 mmHg (ref 32–48)
pH, Arterial: 7.34 — ABNORMAL LOW (ref 7.35–7.45)
pO2, Arterial: 73 mmHg — ABNORMAL LOW (ref 83–108)

## 2024-09-02 LAB — CULTURE, BLOOD (ROUTINE X 2)
Culture: NO GROWTH
Culture: NO GROWTH
Special Requests: ADEQUATE
Special Requests: ADEQUATE

## 2024-09-02 LAB — GLUCOSE, CAPILLARY
Glucose-Capillary: 206 mg/dL — ABNORMAL HIGH (ref 70–99)
Glucose-Capillary: 227 mg/dL — ABNORMAL HIGH (ref 70–99)
Glucose-Capillary: 209 mg/dL — ABNORMAL HIGH (ref 70–99)
Glucose-Capillary: 196 mg/dL — ABNORMAL HIGH (ref 70–99)
Glucose-Capillary: 190 mg/dL — ABNORMAL HIGH (ref 70–99)
Glucose-Capillary: 203 mg/dL — ABNORMAL HIGH (ref 70–99)

## 2024-09-02 LAB — VANCOMYCIN, RANDOM: Vancomycin Rm: 28 ug/mL

## 2024-09-02 LAB — MRSA NEXT GEN BY PCR, NASAL: MRSA by PCR Next Gen: DETECTED — AB

## 2024-09-02 LAB — TRIGLYCERIDES: Triglycerides: 105 mg/dL (ref <150)

## 2024-09-02 LAB — CK
Total CK: 630 U/L — ABNORMAL HIGH (ref 38–234)
Total CK: 562 U/L — ABNORMAL HIGH (ref 38–234)

## 2024-09-02 LAB — LACTIC ACID, PLASMA: Lactic Acid, Venous: 1.9 mmol/L (ref 0.5–1.9)

## 2024-09-02 MED ORDER — INSULIN ASPART 100 UNIT/ML IJ SOLN
0.0000 [IU] | INTRAMUSCULAR | Status: DC
  Administered 2024-09-02: 8 [IU] via SUBCUTANEOUS
  Administered 2024-09-02 (×2): 4 [IU] via SUBCUTANEOUS
  Administered 2024-09-02: 8 [IU] via SUBCUTANEOUS
  Administered 2024-09-03: 4 [IU] via SUBCUTANEOUS
  Administered 2024-09-03 (×2): 8 [IU] via SUBCUTANEOUS
  Administered 2024-09-03: 4 [IU] via SUBCUTANEOUS
  Administered 2024-09-03: 8 [IU] via SUBCUTANEOUS
  Administered 2024-09-03: 12 [IU] via SUBCUTANEOUS
  Administered 2024-09-04: 08:00:00 8 [IU] via SUBCUTANEOUS
  Administered 2024-09-04: 15:00:00 2 [IU] via SUBCUTANEOUS
  Administered 2024-09-04: 12:00:00 4 [IU] via SUBCUTANEOUS
  Administered 2024-09-04: 20:00:00 2 [IU] via SUBCUTANEOUS
  Administered 2024-09-04: 04:00:00 8 [IU] via SUBCUTANEOUS
  Administered 2024-09-04 – 2024-09-05 (×5): 4 [IU] via SUBCUTANEOUS
  Filled 2024-09-02: qty 8
  Filled 2024-09-02: qty 4
  Filled 2024-09-02: qty 8
  Filled 2024-09-02 (×2): qty 4
  Filled 2024-09-02 (×3): qty 8
  Filled 2024-09-02: qty 4
  Filled 2024-09-02: qty 2
  Filled 2024-09-02: qty 4
  Filled 2024-09-02: qty 2
  Filled 2024-09-02: qty 4
  Filled 2024-09-02: qty 8
  Filled 2024-09-02: qty 4
  Filled 2024-09-02: qty 12
  Filled 2024-09-02: qty 4
  Filled 2024-09-02 (×2): qty 8
  Filled 2024-09-02: qty 4

## 2024-09-02 MED ORDER — FOLIC ACID 1 MG PO TABS
1.0000 mg | ORAL_TABLET | Freq: Every day | ORAL | Status: DC
  Administered 2024-09-03 – 2024-09-04 (×2): 1 mg
  Filled 2024-09-02 (×2): qty 1

## 2024-09-02 MED ORDER — HYDROCORTISONE SOD SUC (PF) 100 MG IJ SOLR
50.0000 mg | Freq: Four times a day (QID) | INTRAMUSCULAR | Status: DC
  Administered 2024-09-02 – 2024-09-05 (×14): 50 mg via INTRAVENOUS
  Filled 2024-09-02 (×14): qty 2

## 2024-09-02 MED ORDER — ACETAMINOPHEN 325 MG PO TABS
650.0000 mg | ORAL_TABLET | Freq: Four times a day (QID) | ORAL | Status: DC | PRN
  Administered 2024-09-02: 650 mg
  Filled 2024-09-02: qty 2

## 2024-09-02 MED ORDER — ENSURE PLUS HIGH PROTEIN PO LIQD: 237.0000 mL | Freq: Two times a day (BID) | ORAL | Status: DC

## 2024-09-02 MED ORDER — HEPARIN SODIUM (PORCINE) 5000 UNIT/ML IJ SOLN
5000.0000 [IU] | Freq: Three times a day (TID) | INTRAMUSCULAR | Status: DC
  Administered 2024-09-02 – 2024-09-05 (×10): 5000 [IU] via SUBCUTANEOUS
  Filled 2024-09-02 (×10): qty 1

## 2024-09-02 MED ORDER — CHLORHEXIDINE GLUCONATE CLOTH 2 % EX PADS
6.0000 | MEDICATED_PAD | Freq: Every day | CUTANEOUS | Status: DC
  Administered 2024-09-02 – 2024-09-05 (×4): 6 via TOPICAL

## 2024-09-02 MED ORDER — LACTATED RINGERS IV BOLUS
500.0000 mL | Freq: Once | INTRAVENOUS | Status: AC
  Administered 2024-09-02: 500 mL via INTRAVENOUS

## 2024-09-02 MED ORDER — MUPIROCIN 2 % EX OINT
1.0000 | TOPICAL_OINTMENT | Freq: Two times a day (BID) | CUTANEOUS | Status: DC
  Administered 2024-09-02 – 2024-09-05 (×7): 1 via NASAL
  Filled 2024-09-02: qty 22

## 2024-09-02 MED ORDER — VANCOMYCIN VARIABLE DOSE PER UNSTABLE RENAL FUNCTION (PHARMACIST DOSING): Status: DC

## 2024-09-02 MED ORDER — LINEZOLID 600 MG/300ML IV SOLN
600.0000 mg | Freq: Two times a day (BID) | INTRAVENOUS | Status: DC
  Administered 2024-09-02 – 2024-09-04 (×5): 600 mg via INTRAVENOUS
  Filled 2024-09-02 (×5): qty 300

## 2024-09-02 MED ORDER — VASOPRESSIN 20 UNITS/100 ML INFUSION FOR SHOCK
0.0000 [IU]/min | INTRAVENOUS | Status: DC
  Administered 2024-09-02 – 2024-09-03 (×3): 0.03 [IU]/min via INTRAVENOUS
  Filled 2024-09-02 (×3): qty 100

## 2024-09-02 MED ORDER — ESCITALOPRAM OXALATE 20 MG PO TABS
10.0000 mg | ORAL_TABLET | Freq: Every day | ORAL | Status: DC
  Administered 2024-09-03 – 2024-09-05 (×3): 10 mg
  Filled 2024-09-02 (×3): qty 1

## 2024-09-02 MED ORDER — ADULT MULTIVITAMIN W/MINERALS CH
1.0000 | ORAL_TABLET | Freq: Every day | ORAL | Status: DC
  Administered 2024-09-03 – 2024-09-04 (×2): 1
  Filled 2024-09-02 (×2): qty 1

## 2024-09-02 MED ORDER — OSMOLITE 1.2 CAL PO LIQD
1000.0000 mL | ORAL | Status: DC
  Administered 2024-09-02 – 2024-09-03 (×2): 1000 mL

## 2024-09-02 NOTE — Plan of Care (Signed)
°  Problem: Education: Goal: Knowledge of General Education information will improve Description: Including pain rating scale, medication(s)/side effects and non-pharmacologic comfort measures Outcome: Progressing   Problem: Health Behavior/Discharge Planning: Goal: Ability to manage health-related needs will improve Outcome: Progressing   Problem: Clinical Measurements: Goal: Diagnostic test results will improve Outcome: Progressing Goal: Respiratory complications will improve Outcome: Progressing Goal: Cardiovascular complication will be avoided Outcome: Progressing   Problem: Elimination: Goal: Will not experience complications related to bowel motility Outcome: Progressing Goal: Will not experience complications related to urinary retention Outcome: Progressing   Problem: Respiratory: Goal: Ability to maintain a clear airway and adequate ventilation will improve Outcome: Progressing

## 2024-09-02 NOTE — Consult Note (Signed)
 NAME:  Haley Deleon, MRN:  [id number], DOB:  1956/06/27, LOS: 18 ADMISSION DATE:  08/15/2024, CONSULTATION DATE:  09/01/24 REFERRING MD:  Dr. Kandis, CHIEF COMPLAINT:  Acute Metabolic Encephalopathy   Brief Pt Description / Synopsis:  68 y.o. female with PMHx significant for alcohol  abuse and HFpEF admitted with Acute Metabolic Encephalopathy in setting of UTI, Parainfluenza infection, left leg infection.  Course complicated by persistent Encephalopathy, recurrent fever, high risk for aspiration and possible need for intubation for airway protection.   History of Present Illness:  Haley Deleon is a 68 y.o. female with medical history significant of alcohol  use disorder, dCHF, CAD, depression with anxiety, morbid obesity, ulcerative colitis, NSVT, PSVT, varicose vein in both legs, carotid artery stenosis, morbid obesity, who presents with left leg swelling and pain, confusion.   Patient is currently altered, therefore history is obtained from chart review.  It is reported patient initially presented to ED with left leg swelling and pain, and then she developed confusion in ED. When I saw patient in ED, she is alert, orientated to place and person, not to the time. She moves all extremities normally. No facial droop or slurred speech. She has bilateral lower leg swelling, the left leg is much worse than the right. She does not have pain in the right leg. She denies fall or injury. No fever or chills. She has a varicose vein in both legs. Patient denies chest pain, cough, SOB. No nausea, vomiting, diarrhea or abdominal pain. No symptoms UTI. Patient has candidiasis in groin areas. Per her granddaughter at the bedside, patient used to drink liquor almost every day. Recently she drinks less, and last drinking was several days ago. Patient states that she only drinks once a week recently.   ED Course: Initial Vital Signs: Temperature normal, blood pressure 159/99, heart rate of 107, RR 17, oxygen  saturation 100% on room air  Significant Labs: WBC 10.1, GFR> 60, UA (cloudy appearance, small amount of leukocyte, rare bacteria, WBC 21-50), ammonia 19, GFR> 60, negative UDS  Imaging CT Head>> negative for acute intracranial abnormalities   TRH asked to admit for further workup and treatment.  Please see Significant Hospital Events section below for full detailed hospital course.   Pertinent  Medical History   Past Medical History:  Diagnosis Date   Alcohol  use disorder    Anemia    Atypical chest pain    Carotid arterial disease    a. 11/2017 Carotid U/S: RICA <50, LICA nl.   Claustrophobia    Coronary artery disease    Diastolic dysfunction    a. 11/2017 Echo: EF 60-65%, no rwma, Gr1 DD. Nl RV fxn.   Dysrhythmia    Family history of adverse reaction to anesthesia    a.) early emergence in 3rd degree relative (1st cousin)   Heart murmur    Liver cyst    Pneumonia    Ulcerative colitis (HCC)    Venous insufficiency     Micro Data:  11/26: Urine>> Proteus Mirabilis & E. Coli  11/27: HIV Screen>> nonreactive 11/30: COVID/FLU/RSV PCR>> negative 12/1: RVP >> + Parainfluenza 12/1: MRSA PCR + 12/11: RPR>> nonreactive  12/8: Blood cultures x2>> no growth to date 12/11: Blood cultures x2>> no growth to date    Antimicrobials:   Anti-infectives (From admission, onward)    Start     Dose/Rate Route Frequency Ordered Stop   09/02/24 2100  vancomycin  (VANCOREADY) IVPB 1250 mg/250 mL  Status:  Discontinued  1,250 mg 166.7 mL/hr over 90 Minutes Intravenous Every 24 hours 09/01/24 1811 09/02/24 0845   09/02/24 0845  vancomycin  variable dose per unstable renal function (pharmacist dosing)         Does not apply See admin instructions 09/02/24 0845     09/01/24 2030  vancomycin  (VANCOCIN ) IVPB 1000 mg/200 mL premix       Placed in Followed by Linked Group   1,000 mg 200 mL/hr over 60 Minutes Intravenous  Once 09/01/24 1811 09/01/24 2238   09/01/24 1900  vancomycin   (VANCOREADY) IVPB 1500 mg/300 mL       Placed in Followed by Linked Group   1,500 mg 150 mL/hr over 120 Minutes Intravenous  Once 09/01/24 1811 09/01/24 2135   08/31/24 1600  piperacillin -tazobactam (ZOSYN ) IVPB 3.375 g        3.375 g 12.5 mL/hr over 240 Minutes Intravenous Every 8 hours 08/31/24 1447     08/22/24 0600  vancomycin  (VANCOREADY) IVPB 1500 mg/300 mL  Status:  Discontinued        1,500 mg 150 mL/hr over 120 Minutes Intravenous Every 24 hours 08/21/24 1312 08/23/24 1042   08/21/24 1130  vancomycin  (VANCOREADY) IVPB 2000 mg/400 mL        2,000 mg 200 mL/hr over 120 Minutes Intravenous  Once 08/21/24 1040 08/21/24 1426   08/20/24 1730  azithromycin  (ZITHROMAX ) tablet 500 mg  Status:  Discontinued        500 mg Oral Daily 08/20/24 1632 08/23/24 1042   08/16/24 0100  cefTRIAXone  (ROCEPHIN ) 2 g in sodium chloride  0.9 % 100 mL IVPB  Status:  Discontinued        2 g 200 mL/hr over 30 Minutes Intravenous Every 24 hours 08/16/24 0057 08/23/24 1042       Significant Hospital Events: Including procedures, antibiotic start and stop dates in addition to other pertinent events   11/25: admitted to hospitalist w/ acute metabolic encephalopathy EtOH withdrawal, UTI.  11/26: remains confused / hallucinating this morning. Continuing withdrawal protocols  11/27: remains confused but is more calm today. Urine culture still pending.  11/28: remains confused but is more calm today and bit more conversant but not making sense. Urine culture still pending - staff has confirmed w/ lab that it is in process today as of 05:00 11/29: more alert but remains confused. PT/OT ordered for tomorrow. UCX pend susceptibilities  11/30: Proteus and Ecoli both sensitive to rocephin . Febrile this AM so will leave on abx, CXR read as possible R mid-lung atelectasis / early infection, also vascular congestion. Gave dose Lasix  and will add azithro for possible PNA 12/01: continues to be febrile, more oriented but  still confused / not conversational so difficult history. No skin lesions. Added vancomycin  given (+)MRSA screen and skin erythema groin, but likely d/t parainfluenza virus 12/02: febrile but improved, remains somewhat confused, continue abx 12/9: ID consulted for AMS and recurrent fevers after initial treatment of UTI and CAP. 12/10: Neurology consulted for AMS. defervescing. Some improvement Mental status. UA not a clean catch but mixed. Some cough today. MRI head neg. Neck is supple so doubt meningitis and the waxing and waning mental status would be unusual for bacterial meningitis  12/11: Afebrile, more alert but remains confused.  Coughing on exam, Unsuccessful LP attempt.  12/12: Remains encephalopathic, high risk for aspiration.  PCCM consulted to evaluate ability to protect airway and possible need for intubation.  Palliative Care consulted. 12/13: ordered for BLE dopplers for mottling L>R, attempting to wean  pressors (vaso/Levo). Started on IV hydrocortisone  q6h for septic shock. CXR with basilar atelectasis. Initiated enteral feeds via NGT. Urine culture pending.   Interim History / Subjective:  As outlined above under Significant Hospital Events section  Objective   Blood pressure 128/61, pulse 64, temperature 98 F (36.7 C), temperature source Axillary, resp. rate 16, height 5' 7 (1.702 m), weight 116.9 kg, SpO2 97%.    Vent Mode: PRVC FiO2 (%):  [30 %-60 %] 30 % Set Rate:  [15 bmp] 15 bmp Vt Set:  [450 mL] 450 mL PEEP:  [5 cmH20] 5 cmH20   Intake/Output Summary (Last 24 hours) at 09/02/2024 0952 Last data filed at 09/02/2024 9167 Gross per 24 hour  Intake 2768.24 ml  Output 750 ml  Net 2018.24 ml   Filed Weights   08/30/24 0500 08/31/24 0424 09/02/24 0500  Weight: 113.9 kg 111.7 kg 116.9 kg    Examination: General: Acute on chronically ill appearing female, supine in bed, on room air, in NAD HENT: Atraumatic, normocephalic, neck supple, no JVD. Orally intubated.  NGT in place Lungs: Fine crackles throughout, even, nonlabored Cardiovascular: RRR, s1s2, no M/R/G Abdomen: Obese, soft, nontender, nondistended, no guarding or rebound tenderness Extremities: Generalized weakness, BLE 3+ edema (L slightly >R) with mottling. +pedal signals via doppler Neuro: Altered, no purposeful movement although said to have been observed overnight, withdraws from painful stimuli, currently unable to follow commands, PERRL 3mm GU: External female catheter in place draining yellow urine   Resolved Hospital Problem list     Assessment & Plan:   #Acute Metabolic Encephalopathy #Alcohol  Withdrawal PMHx: ETOH abuse, depression, anxiety MRI Brain 12/9 unremarkable LP attempted on 12/11 by IR but unsuccessful -Treatment of metabolic derangements as outlined below  -Provide supportive care -Promote normal sleep/wake cycle and family presence -Avoid sedating medications as able -B12, TSH, Folate, Ammonia are all normal -B1 pending  -Neurology following, appreciate input  -Continue high dose thiamine   -Spot EEG 12/12 with diffuse slowing, no seizure activity -Anticipate second attempt to obtain CSF and OP early week of 12/15  #Acute hypoxemic respiratory failure requiring mechanical ventilation #Parainfluenza infection #Questionable Aspiration  -Intubated 12/12 after rapid response on the floor -VAP prevention -Trend ABG with lactate  -CXR prn, personally reviewed 12/13  #Recurrent Fever #UTI: Proteus & E. Coli ~ TREATED  #Left Leg infection #Lactic acidosis #Septic Shock -Monitor fever curve -Trend WBC's, Procalcitonin 0.1 -Follow cultures as above -ID following, appreciate input ~ Continue empiric Zosyn  per sensitivities -BLE arterial doppler ordered for mottling -Trend CK -s/p fluid boluses total 1L 12/12 -initiated IV hydrocortisone  50 mg q6h scheduled (12/13)  #AKI -daily BMP -avoid nephrotoxic agents -ensure adequate perfusion with MAP > or equal  to 65 -strict I&Os, foley catheter in place  Pt is critically ill with multiorgan failure. Prognosis is guarded, high risk for further decompensation, cardiac arrest, and death.  Given current critical illness superimposed on multiple chronic co-morbidities and advanced age, overall long term prognosis is poor.  Recommend consideration of DNR/DNI status. Palliative Care following to assist with GOC discussions.   Best Practice (right click and Reselect all SmartList Selections daily)   Diet/type: NPO , enteral feeds DVT prophylaxis: LMWH GI prophylaxis: PPI Lines: Central line Foley:  Yes, and it is still needed Code Status:  full code Last date of multidisciplinary goals of care discussion: 12/13   Labs   CBC: Recent Labs  Lab 08/27/24 0530 08/29/24 0434 09/01/24 0522 09/01/24 1838 09/02/24 0425  WBC 8.7 9.7 13.6*  22.8* 34.2*  NEUTROABS 5.9  --   --   --   --   HGB 15.0 14.7 14.9 14.7 14.4  HCT 46.9* 44.9 45.7 45.6 45.6  MCV 92.5 90.9 92.3 92.7 94.0  PLT 289 353 230 240 229    Basic Metabolic Panel: Recent Labs  Lab 08/29/24 0434 08/30/24 1045 09/01/24 0522 09/01/24 1838 09/02/24 0425  NA 146* 141 141 142 138  K 4.0 3.9 3.8 3.7 3.5  CL 108 105 107 105 104  CO2 22 22 21* 21* 19*  GLUCOSE 118* 150* 188* 213* 249*  BUN 16 20 28* 30* 33*  CREATININE 0.77 0.74 1.01* 1.20* 1.86*  CALCIUM 9.3 9.6 9.5 9.2 9.3  PHOS  --   --   --  4.4  --    GFR: Estimated Creatinine Clearance: 38.8 mL/min (A) (by C-G formula based on SCr of 1.86 mg/dL (H)). Recent Labs  Lab 08/28/24 1340 08/29/24 0434 09/01/24 0522 09/01/24 1838 09/01/24 2114 09/02/24 0425  PROCALCITON 0.10  --   --   --   --   --   WBC  --  9.7 13.6* 22.8*  --  34.2*  LATICACIDVEN  --   --   --  1.4 4.4*  --     Liver Function Tests: Recent Labs  Lab 08/29/24 0434 08/30/24 1045 09/01/24 0522 09/01/24 1838 09/02/24 0425  AST 71* 64* 39  --  61*  ALT 71* 73* 53*  --  89*  ALKPHOS 109 112 115  --   146*  BILITOT 0.7 1.0 1.0  --  0.9  PROT 7.1 7.5 7.6  --  7.1  ALBUMIN 3.5 3.5 3.6 3.5 3.3*   No results for input(s): LIPASE, AMYLASE in the last 168 hours. Recent Labs  Lab 08/29/24 0434  AMMONIA 25    ABG    Component Value Date/Time   PHART 7.34 (L) 09/02/2024 0457   PCO2ART 34 09/02/2024 0457   PO2ART 73 (L) 09/02/2024 0457   HCO3 18.3 (L) 09/02/2024 0457   ACIDBASEDEF 6.6 (H) 09/02/2024 0457   O2SAT 97.8 09/02/2024 0457     Coagulation Profile: No results for input(s): INR, PROTIME in the last 168 hours.  Cardiac Enzymes: No results for input(s): CKTOTAL, CKMB, CKMBINDEX, TROPONINI in the last 168 hours.  HbA1C: Hgb A1c MFr Bld  Date/Time Value Ref Range Status  02/24/2021 02:45 AM 5.3 4.8 - 5.6 % Final    Comment:    (NOTE)         Prediabetes: 5.7 - 6.4         Diabetes: >6.4         Glycemic control for adults with diabetes: <7.0     CBG: Recent Labs  Lab 09/01/24 1637 09/01/24 2127 09/01/24 2341 09/02/24 0553 09/02/24 0803  GLUCAP 229* 197* 175* 206* 227*    Review of Systems:   Unable to assess due to AMS  Past Medical History:  She,  has a past medical history of Alcohol  use disorder, Anemia, Atypical chest pain, Carotid arterial disease, Claustrophobia, Coronary artery disease, Diastolic dysfunction, Dysrhythmia, Family history of adverse reaction to anesthesia, Heart murmur, Liver cyst, Pneumonia, Ulcerative colitis (HCC), and Venous insufficiency.   Surgical History:   Past Surgical History:  Procedure Laterality Date   BREAST BIOPSY Right 11/02/2011   ultrasound guided biopsy/clip-benign   CHOLECYSTECTOMY N/A 07/29/2017   Procedure: LAPAROSCOPIC CHOLECYSTECTOMY;  Surgeon: Jordis Laneta FALCON, MD;  Location: [facility];  Service: General;  Laterality: N/A;  COLON SURGERY  2014, 2015   DUE TO ULERATIVE COLITIS WITH COLOSTOMY   COLOSTOMY TAKEDOWN     FOOT SURGERY Bilateral    BONE SPURS    HYSTEROSCOPY WITH D & C N/A  09/11/2022   Procedure: FRACTIONAL DILATATION AND CURETTAGE /HYSTEROSCOPY;  Surgeon: Schermerhorn, Debby PARAS, MD;  Location: [facility];  Service: Gynecology;  Laterality: N/A;   MANDIBLE FRACTURE SURGERY  1991   MOUTH SURGERY     VENTRAL HERNIA REPAIR N/A 07/29/2017   Procedure: LAPAROSCOPIC VENTRAL HERNIA;  Surgeon: Jordis Laneta FALCON, MD;  Location: [facility];  Service: General;  Laterality: N/A;     Social History:   reports that she quit smoking about 19 years ago. Her smoking use included cigarettes. She started smoking about 39 years ago. She has a 20 pack-year smoking history. She has never used smokeless tobacco. She reports current alcohol  use. She reports that she does not use drugs.   Family History:  Her family history includes Heart attack (age of onset: 57) in her mother; Hyperlipidemia in her brother. There is no history of Breast cancer.   Allergies Allergies[1]   Home Medications  Prior to Admission medications  Medication Sig Start Date End Date Taking? Authorizing Provider  escitalopram  (LEXAPRO ) 10 MG tablet Take 10 mg by mouth daily.   Yes [provider]  ferrous sulfate 325 (65 FE) MG tablet Take 325 mg by mouth daily as needed.    Yes [provider]  loperamide  (IMODIUM ) 2 MG capsule Take 2 mg as needed by mouth for diarrhea or loose stools (NORMALLY TAKES 2-3 TIMES DAILY SINCE HAVING HER COLON SURGERY).    Yes [provider]  Multiple Vitamin (MULTIVITAMIN) tablet Take 1 tablet by mouth daily.   Yes [provider]  gabapentin  (NEURONTIN ) 300 MG capsule Take 1 capsule by mouth at bedtime. Patient not taking: Reported on 08/16/2024    [provider]  hydrOXYzine  (ATARAX ) 10 MG tablet Take 10 mg by mouth 3 (three) times daily as needed for anxiety. Patient not taking: Reported on 08/16/2024    [provider]     Critical care time: 55 minutes     Marjorie Deprey, ACNP-BC Pulmonary Critical Care,  [place] Phone: [phone]         [1]  Allergies Allergen Reactions   Caffeine Anaphylaxis and Other (See Comments)   Chocolate Anaphylaxis   Chocolate Flavoring Agent (Non-Screening) Anaphylaxis   Cocoa Anaphylaxis   Meloxicam Other (See Comments)    migraine and bleeding  Severe headache  meloxicam

## 2024-09-02 NOTE — Plan of Care (Signed)
°  Problem: Coping: Goal: Level of anxiety will decrease Outcome: Progressing   Problem: Elimination: Goal: Will not experience complications related to bowel motility Outcome: Progressing Goal: Will not experience complications related to urinary retention Outcome: Progressing   Problem: Pain Managment: Goal: General experience of comfort will improve and/or be controlled Outcome: Progressing   Problem: Safety: Goal: Ability to remain free from injury will improve Outcome: Progressing   Problem: Education: Goal: Knowledge of General Education information will improve Description: Including pain rating scale, medication(s)/side effects and non-pharmacologic comfort measures Outcome: Not Progressing   Problem: Activity: Goal: Risk for activity intolerance will decrease Outcome: Not Progressing   Problem: Nutrition: Goal: Adequate nutrition will be maintained Outcome: Not Progressing

## 2024-09-02 NOTE — Progress Notes (Signed)
 Subjective: Intubated, on Propofol   Objective: Current vital signs: BP (!) 168/151 (BP Location: Left Wrist)   Pulse 89   Temp 98.7 F (37.1 C) (Axillary)   Resp 16   Ht 5' 7 (1.702 m)   Wt 116.9 kg   SpO2 98%   BMI 40.36 kg/m  Vital signs in last 24 hours: Temp:  [98 F (36.7 C)-102 F (38.9 C)] 98.7 F (37.1 C) (12/13 1230) Pulse Rate:  [64-121] 89 (12/13 1230) Resp:  [15-37] 16 (12/13 1230) BP: (70-168)/(43-151) 168/151 (12/13 1230) SpO2:  [89 %-100 %] 98 % (12/13 1230) FiO2 (%):  [30 %-60 %] 30 % (12/13 1230) Weight:  [116.9 kg] 116.9 kg (12/13 0500)  Intake/Output from previous day: 12/12 0701 - 12/13 0700 In: 2706.8 [I.V.:620.4; IV Piggyback:2086.5] Out: 1525 [Urine:1525] Intake/Output this shift: Total I/O In: 221.9 [I.V.:172.4; IV Piggyback:49.5] Out: 390 [Urine:300; Emesis/NG output:90] Nutritional status:  Diet Order             Diet NPO time specified  Diet effective now                   Neurologic Exam: Mental Status: Patient does not respond to verbal stimuli.  Localizes to sternal rub, left greater than right.  Does not follow commands.  No verbalizations noted.  Cranial Nerves: II: patient does not respond confrontation bilaterally III,IV,VI: Oculocephalic response present bilaterally.  V,VII: corneal reflex present bilaterally  VIII: patient does not respond to verbal stimuli Motor: Moves all extremities, left greater than right Sensory: Responds to noxious stimuli in all extremities.   Lab Results: Basic Metabolic Panel: Recent Labs  Lab 08/29/24 0434 08/30/24 1045 09/01/24 0522 09/01/24 1838 09/02/24 0425  NA 146* 141 141 142 138  K 4.0 3.9 3.8 3.7 3.5  CL 108 105 107 105 104  CO2 22 22 21* 21* 19*  GLUCOSE 118* 150* 188* 213* 249*  BUN 16 20 28* 30* 33*  CREATININE 0.77 0.74 1.01* 1.20* 1.86*  CALCIUM 9.3 9.6 9.5 9.2 9.3  PHOS  --   --   --  4.4  --     Liver Function Tests: Recent Labs  Lab 08/29/24 0434  08/30/24 1045 09/01/24 0522 09/01/24 1838 09/02/24 0425  AST 71* 64* 39  --  61*  ALT 71* 73* 53*  --  89*  ALKPHOS 109 112 115  --  146*  BILITOT 0.7 1.0 1.0  --  0.9  PROT 7.1 7.5 7.6  --  7.1  ALBUMIN 3.5 3.5 3.6 3.5 3.3*   No results for input(s): LIPASE, AMYLASE in the last 168 hours. Recent Labs  Lab 08/29/24 0434  AMMONIA 25    CBC: Recent Labs  Lab 08/27/24 0530 08/29/24 0434 09/01/24 0522 09/01/24 1838 09/02/24 0425  WBC 8.7 9.7 13.6* 22.8* 34.2*  NEUTROABS 5.9  --   --   --   --   HGB 15.0 14.7 14.9 14.7 14.4  HCT 46.9* 44.9 45.7 45.6 45.6  MCV 92.5 90.9 92.3 92.7 94.0  PLT 289 353 230 240 229    Cardiac Enzymes: No results for input(s): CKTOTAL, CKMB, CKMBINDEX, TROPONINI in the last 168 hours.  Lipid Panel: Recent Labs  Lab 09/02/24 0425  TRIG 105    CBG: Recent Labs  Lab 09/01/24 2127 09/01/24 2341 09/02/24 0553 09/02/24 0803 09/02/24 1117  GLUCAP 197* 175* 206* 227* 209*    Microbiology: Results for orders placed or performed during the hospital encounter of 08/15/24  Urine Culture (  for pregnant, neutropenic or urologic patients or patients with an indwelling urinary catheter)     Status: Abnormal   Collection Time: 08/16/24  5:07 AM   Specimen: Urine, Clean Catch  Result Value Ref Range Status   Specimen Description   Final    URINE, CLEAN CATCH Performed at [facility], [address], [place], [place] [address]    Special Requests   Final    NONE Performed at [facility], [address] [place], [place] [address]    Culture (A)  Final    30,000 COLONIES/mL PROTEUS MIRABILIS >=100,000 COLONIES/mL ESCHERICHIA COLI    Report Status 08/20/2024 FINAL  Final   Organism ID, Bacteria PROTEUS MIRABILIS (A)  Final   Organism ID, Bacteria ESCHERICHIA COLI (A)  Final      Susceptibility   Escherichia coli - MIC*    AMPICILLIN >=32 RESISTANT Resistant     CEFAZOLIN  (URINE) Value in next row  Sensitive      2 SENSITIVEThis is a modified FDA-approved test that has been validated and its performance characteristics determined by the reporting laboratory.  This laboratory is certified under the Clinical Laboratory Improvement Amendments CLIA as qualified to perform high complexity clinical laboratory testing.    CEFEPIME Value in next row Sensitive      2 SENSITIVEThis is a modified FDA-approved test that has been validated and its performance characteristics determined by the reporting laboratory.  This laboratory is certified under the Clinical Laboratory Improvement Amendments CLIA as qualified to perform high complexity clinical laboratory testing.    ERTAPENEM Value in next row Sensitive      2 SENSITIVEThis is a modified FDA-approved test that has been validated and its performance characteristics determined by the reporting laboratory.  This laboratory is certified under the Clinical Laboratory Improvement Amendments CLIA as qualified to perform high complexity clinical laboratory testing.    CEFTRIAXONE  Value in next row Sensitive      2 SENSITIVEThis is a modified FDA-approved test that has been validated and its performance characteristics determined by the reporting laboratory.  This laboratory is certified under the Clinical Laboratory Improvement Amendments CLIA as qualified to perform high complexity clinical laboratory testing.    CIPROFLOXACIN Value in next row Sensitive      2 SENSITIVEThis is a modified FDA-approved test that has been validated and its performance characteristics determined by the reporting laboratory.  This laboratory is certified under the Clinical Laboratory Improvement Amendments CLIA as qualified to perform high complexity clinical laboratory testing.    GENTAMICIN Value in next row Sensitive      2 SENSITIVEThis is a modified FDA-approved test that has been validated and its performance characteristics determined by the reporting laboratory.  This  laboratory is certified under the Clinical Laboratory Improvement Amendments CLIA as qualified to perform high complexity clinical laboratory testing.    NITROFURANTOIN Value in next row Sensitive      2 SENSITIVEThis is a modified FDA-approved test that has been validated and its performance characteristics determined by the reporting laboratory.  This laboratory is certified under the Clinical Laboratory Improvement Amendments CLIA as qualified to perform high complexity clinical laboratory testing.    TRIMETH/SULFA Value in next row Sensitive      2 SENSITIVEThis is a modified FDA-approved test that has been validated and its performance characteristics determined by the reporting laboratory.  This laboratory is certified under the Clinical Laboratory Improvement Amendments CLIA as qualified to perform high complexity clinical laboratory testing.  AMPICILLIN/SULBACTAM Value in next row Sensitive      2 SENSITIVEThis is a modified FDA-approved test that has been validated and its performance characteristics determined by the reporting laboratory.  This laboratory is certified under the Clinical Laboratory Improvement Amendments CLIA as qualified to perform high complexity clinical laboratory testing.    PIP/TAZO Value in next row Sensitive      <=4 SENSITIVEThis is a modified FDA-approved test that has been validated and its performance characteristics determined by the reporting laboratory.  This laboratory is certified under the Clinical Laboratory Improvement Amendments CLIA as qualified to perform high complexity clinical laboratory testing.    MEROPENEM Value in next row Sensitive      <=4 SENSITIVEThis is a modified FDA-approved test that has been validated and its performance characteristics determined by the reporting laboratory.  This laboratory is certified under the Clinical Laboratory Improvement Amendments CLIA as qualified to perform high complexity clinical laboratory testing.    *  >=100,000 COLONIES/mL ESCHERICHIA COLI   Proteus mirabilis - MIC*    AMPICILLIN Value in next row Sensitive      <=4 SENSITIVEThis is a modified FDA-approved test that has been validated and its performance characteristics determined by the reporting laboratory.  This laboratory is certified under the Clinical Laboratory Improvement Amendments CLIA as qualified to perform high complexity clinical laboratory testing.    CEFAZOLIN  (URINE) Value in next row Sensitive      8 SENSITIVEThis is a modified FDA-approved test that has been validated and its performance characteristics determined by the reporting laboratory.  This laboratory is certified under the Clinical Laboratory Improvement Amendments CLIA as qualified to perform high complexity clinical laboratory testing.    CEFEPIME Value in next row Sensitive      8 SENSITIVEThis is a modified FDA-approved test that has been validated and its performance characteristics determined by the reporting laboratory.  This laboratory is certified under the Clinical Laboratory Improvement Amendments CLIA as qualified to perform high complexity clinical laboratory testing.    ERTAPENEM Value in next row Sensitive      8 SENSITIVEThis is a modified FDA-approved test that has been validated and its performance characteristics determined by the reporting laboratory.  This laboratory is certified under the Clinical Laboratory Improvement Amendments CLIA as qualified to perform high complexity clinical laboratory testing.    CEFTRIAXONE  Value in next row Sensitive      8 SENSITIVEThis is a modified FDA-approved test that has been validated and its performance characteristics determined by the reporting laboratory.  This laboratory is certified under the Clinical Laboratory Improvement Amendments CLIA as qualified to perform high complexity clinical laboratory testing.    CIPROFLOXACIN Value in next row Sensitive      8 SENSITIVEThis is a modified FDA-approved test that  has been validated and its performance characteristics determined by the reporting laboratory.  This laboratory is certified under the Clinical Laboratory Improvement Amendments CLIA as qualified to perform high complexity clinical laboratory testing.    GENTAMICIN Value in next row Sensitive      8 SENSITIVEThis is a modified FDA-approved test that has been validated and its performance characteristics determined by the reporting laboratory.  This laboratory is certified under the Clinical Laboratory Improvement Amendments CLIA as qualified to perform high complexity clinical laboratory testing.    NITROFURANTOIN Value in next row Resistant      8 SENSITIVEThis is a modified FDA-approved test that has been validated and its performance characteristics determined by the reporting laboratory.  This  laboratory is certified under the Clinical Laboratory Improvement Amendments CLIA as qualified to perform high complexity clinical laboratory testing.    TRIMETH/SULFA Value in next row Sensitive      8 SENSITIVEThis is a modified FDA-approved test that has been validated and its performance characteristics determined by the reporting laboratory.  This laboratory is certified under the Clinical Laboratory Improvement Amendments CLIA as qualified to perform high complexity clinical laboratory testing.    AMPICILLIN/SULBACTAM Value in next row Sensitive      8 SENSITIVEThis is a modified FDA-approved test that has been validated and its performance characteristics determined by the reporting laboratory.  This laboratory is certified under the Clinical Laboratory Improvement Amendments CLIA as qualified to perform high complexity clinical laboratory testing.    PIP/TAZO Value in next row Sensitive      <=4 SENSITIVEThis is a modified FDA-approved test that has been validated and its performance characteristics determined by the reporting laboratory.  This laboratory is certified under the Clinical Laboratory  Improvement Amendments CLIA as qualified to perform high complexity clinical laboratory testing.    MEROPENEM Value in next row Sensitive      <=4 SENSITIVEThis is a modified FDA-approved test that has been validated and its performance characteristics determined by the reporting laboratory.  This laboratory is certified under the Clinical Laboratory Improvement Amendments CLIA as qualified to perform high complexity clinical laboratory testing.    * 30,000 COLONIES/mL PROTEUS MIRABILIS  Resp panel by RT-PCR (RSV, Flu A&B, Covid) Anterior Nasal Swab     Status: None   Collection Time: 08/20/24  5:34 PM   Specimen: Anterior Nasal Swab  Result Value Ref Range Status   SARS Coronavirus 2 by RT PCR NEGATIVE NEGATIVE Final    Comment: (NOTE) SARS-CoV-2 target nucleic acids are NOT DETECTED.  The SARS-CoV-2 RNA is generally detectable in upper respiratory specimens during the acute phase of infection. The lowest concentration of SARS-CoV-2 viral copies this assay can detect is 138 copies/mL. A negative result does not preclude SARS-Cov-2 infection and should not be used as the sole basis for treatment or other patient management decisions. A negative result may occur with  improper specimen collection/handling, submission of specimen other than nasopharyngeal swab, presence of viral mutation(s) within the areas targeted by this assay, and inadequate number of viral copies(<138 copies/mL). A negative result must be combined with clinical observations, patient history, and epidemiological information. The expected result is Negative.  Fact Sheet for Patients:  bloggercourse.com  Fact Sheet for Healthcare Providers:  seriousbroker.it  This test is no t yet approved or cleared by the [place]  FDA and  has been authorized for detection and/or diagnosis of SARS-CoV-2 by FDA under an Emergency Use Authorization (EUA). This EUA will remain  in  effect (meaning this test can be used) for the duration of the COVID-19 declaration under Section 564(b)(1) of the Act, 21 U.S.C.section 360bbb-3(b)(1), unless the authorization is terminated  or revoked sooner.       Influenza A by PCR NEGATIVE NEGATIVE Final   Influenza B by PCR NEGATIVE NEGATIVE Final    Comment: (NOTE) The Xpert Xpress SARS-CoV-2/FLU/RSV plus assay is intended as an aid in the diagnosis of influenza from Nasopharyngeal swab specimens and should not be used as a sole basis for treatment. Nasal washings and aspirates are unacceptable for Xpert Xpress SARS-CoV-2/FLU/RSV testing.  Fact Sheet for Patients: bloggercourse.com  Fact Sheet for Healthcare Providers: seriousbroker.it  This test is not yet approved or cleared by the [place]  [place] FDA and has been authorized for detection and/or diagnosis of SARS-CoV-2 by FDA under an Emergency Use Authorization (EUA). This EUA will remain in effect (meaning this test can be used) for the duration of the COVID-19 declaration under Section 564(b)(1) of the Act, 21 U.S.C. section 360bbb-3(b)(1), unless the authorization is terminated or revoked.     Resp Syncytial Virus by PCR NEGATIVE NEGATIVE Final    Comment: (NOTE) Fact Sheet for Patients: bloggercourse.com  Fact Sheet for Healthcare Providers: seriousbroker.it  This test is not yet approved or cleared by the [place]  FDA and has been authorized for detection and/or diagnosis of SARS-CoV-2 by FDA under an Emergency Use Authorization (EUA). This EUA will remain in effect (meaning this test can be used) for the duration of the COVID-19 declaration under Section 564(b)(1) of the Act, 21 U.S.C. section 360bbb-3(b)(1), unless the authorization is terminated or revoked.  Performed at [facility], [address] [place], [place] [address]    Respiratory (~20 pathogens) panel by PCR     Status: Abnormal   Collection Time: 08/21/24  8:23 AM   Specimen: Nasopharyngeal Swab; Respiratory  Result Value Ref Range Status   Adenovirus NOT DETECTED NOT DETECTED Final   Coronavirus 229E NOT DETECTED NOT DETECTED Final    Comment: (NOTE) The Coronavirus on the Respiratory Panel, DOES NOT test for the novel  Coronavirus (2019 nCoV)    Coronavirus HKU1 NOT DETECTED NOT DETECTED Final   Coronavirus NL63 NOT DETECTED NOT DETECTED Final   Coronavirus OC43 NOT DETECTED NOT DETECTED Final   Metapneumovirus NOT DETECTED NOT DETECTED Final   Rhinovirus / Enterovirus NOT DETECTED NOT DETECTED Final   Influenza A NOT DETECTED NOT DETECTED Final   Influenza B NOT DETECTED NOT DETECTED Final   Parainfluenza Virus 1 DETECTED (A) NOT DETECTED Final   Parainfluenza Virus 2 NOT DETECTED NOT DETECTED Final   Parainfluenza Virus 3 NOT DETECTED NOT DETECTED Final   Parainfluenza Virus 4 NOT DETECTED NOT DETECTED Final   Respiratory Syncytial Virus NOT DETECTED NOT DETECTED Final   Bordetella pertussis NOT DETECTED NOT DETECTED Final   Bordetella Parapertussis NOT DETECTED NOT DETECTED Final   Chlamydophila pneumoniae NOT DETECTED NOT DETECTED Final   Mycoplasma pneumoniae NOT DETECTED NOT DETECTED Final    Comment: Performed at [facility], [address] [place], [place] [address]  MRSA Next Gen by PCR, Nasal     Status: Abnormal   Collection Time: 08/21/24  8:23 AM   Specimen: Nasal Mucosa; Nasal Swab  Result Value Ref Range Status   MRSA by PCR Next Gen DETECTED (A) NOT DETECTED Final    Comment: RESULT CALLED TO, READ BACK BY AND VERIFIED WITH: ALLIE T. RN 1032 08/21/24 HNM (NOTE) The GeneXpert MRSA Assay (FDA approved for NASAL specimens only), is one component of a comprehensive MRSA colonization surveillance program. It is not intended to diagnose MRSA infection nor to guide or monitor treatment for MRSA infections. Test  performance is not FDA approved in patients less than 38 years old. Performed at [facility], [address] [place], [place] [address]   Culture, blood (Routine X 2) w Reflex to ID Panel     Status: None   Collection Time: 08/28/24  1:40 PM   Specimen: BLOOD  Result Value Ref Range Status   Specimen Description BLOOD BLOOD RIGHT HAND  Final   Special Requests   Final    BOTTLES DRAWN AEROBIC AND ANAEROBIC Blood Culture adequate volume  Culture   Final    NO GROWTH 5 DAYS Performed at [facility], [address] [place], [place] [address]    Report Status 09/02/2024 FINAL  Final  Culture, blood (Routine X 2) w Reflex to ID Panel     Status: None   Collection Time: 08/28/24  1:40 PM   Specimen: BLOOD  Result Value Ref Range Status   Specimen Description BLOOD BLOOD LEFT HAND  Final   Special Requests   Final    BOTTLES DRAWN AEROBIC AND ANAEROBIC Blood Culture adequate volume   Culture   Final    NO GROWTH 5 DAYS Performed at [facility], [address] [place], [place] [address]    Report Status 09/02/2024 FINAL  Final  Culture, blood (Routine X 2) w Reflex to ID Panel     Status: None (Preliminary result)   Collection Time: 08/31/24  1:28 PM   Specimen: BLOOD  Result Value Ref Range Status   Specimen Description BLOOD BLOOD LEFT HAND  Final   Special Requests   Final    BOTTLES DRAWN AEROBIC AND ANAEROBIC Blood Culture adequate volume   Culture   Final    NO GROWTH 2 DAYS Performed at [facility], [address] [place], [place] [address]    Report Status PENDING  Incomplete  Culture, blood (Routine X 2) w Reflex to ID Panel     Status: None (Preliminary result)   Collection Time: 08/31/24  1:28 PM   Specimen: BLOOD  Result Value Ref Range Status   Specimen Description BLOOD BLOOD RIGHT HAND  Final   Special Requests   Final    BOTTLES DRAWN AEROBIC AND ANAEROBIC Blood Culture adequate volume   Culture   Final    NO  GROWTH 2 DAYS Performed at [facility], [address] [place], [place] [address]    Report Status PENDING  Incomplete  Culture, Respiratory w Gram Stain     Status: None (Preliminary result)   Collection Time: 09/01/24  4:49 PM   Specimen: Bronchoalveolar Lavage; Respiratory  Result Value Ref Range Status   Specimen Description   Final    BRONCHIAL ALVEOLAR LAVAGE Performed at [facility], [address], [place], [place] [address]    Special Requests   Final    NONE Performed at [facility], [address] [place], [place] [address]    Gram Stain   Final    FEW WBC PRESENT, PREDOMINANTLY PMN FEW GRAM POSITIVE COCCI Performed at [facility], [address], [place], [place] [address]    Culture PENDING  Incomplete   Report Status PENDING  Incomplete  MRSA Next Gen by PCR, Nasal     Status: Abnormal   Collection Time: 09/02/24  2:43 AM   Specimen: Nasal Mucosa; Nasal Swab  Result Value Ref Range Status   MRSA by PCR Next Gen DETECTED (A) NOT DETECTED Corrected    Comment: (NOTE) The GeneXpert MRSA Assay (FDA approved for NASAL specimens only), is one component of a comprehensive MRSA colonization surveillance program. It is not intended to diagnose MRSA infection nor to guide or monitor treatment for MRSA infections. Test performance is not FDA approved in patients less than 87 years old. Performed at [facility], [address] [place], [place] [address] CORRECTED ON 12/13 AT 0840: PREVIOUSLY REPORTED AS DETECTED CRITICAL RESULT CALLED TO, READ BACK BY AND VERIFIED WITH: FREDIA FURY RN AT [phone] 09/02/24 RAM  Coagulation Studies: No results for input(s): LABPROT, INR in the last 72 hours.  Imaging: DG Chest Port 1 View Result Date: 09/02/2024 CLINICAL DATA:  Respiratory failure. EXAM: PORTABLE CHEST 1 VIEW COMPARISON:  09/01/2024 FINDINGS: Endotracheal tube tip is 2.1 cm above the base of the carina. The NG tube  passes into the stomach although the distal tip position is not included on the film. Cardiopericardial silhouette is at upper limits of normal for size. The lungs are clear without focal pneumonia, edema, pneumothorax or pleural effusion. Old posterior left rib fractures evident. Left IJ central line tip projects at the level of the innominate vein confluence. IMPRESSION: 1. Endotracheal tube tip is 2.1 cm above the base of the carina. 2. No acute cardiopulmonary findings. Electronically Signed   By: Camellia Candle M.D.   On: 09/02/2024 09:21   EEG adult Result Date: 09/02/2024 Shelton Arlin KIDD, MD     09/02/2024  7:32 AM Patient Name: ENVY MENO MRN: [id number] Epilepsy Attending: Arlin KIDD Shelton Referring Physician/Provider: Germaine Raring, MD Date: 09/01/2024 Duration: 29.21 mins Patient history: 68yo F with ams. EEG to evaluate for seizure Level of alertness: comatose/ lethargic AEDs during EEG study: Propofol  Technical aspects: This EEG study was done with scalp electrodes positioned according to the 10-20 International system of electrode placement. Electrical activity was reviewed with band pass filter of 1-70Hz , sensitivity of 7 uV/mm, display speed of 37mm/sec with a 60Hz  notched filter applied as appropriate. EEG data were recorded continuously and digitally stored.  Video monitoring was available and reviewed as appropriate. Description: EEG showed continuous generalized 3 to 6 Hz theta-delta slowing. Hyperventilation and photic stimulation were not performed.   ABNORMALITY - Continuous slow, generalized IMPRESSION: This study is suggestive of generalized cerebral dysfunction ( encephalopathy). No seizures or epileptiform discharges were seen throughout the recording. Arlin KIDD Shelton   DG Abd Portable 1V Result Date: 09/01/2024 EXAM: 1 VIEW XRAY OF THE ABDOMEN 09/01/2024 05:27:00 PM COMPARISON: CT abdomen/pelvis dated 08/31/2024. CLINICAL HISTORY: Encounter for orogastric (OG) tube  placement. FINDINGS: LINES, TUBES AND DEVICES: Enteric tube in place with tip in the distal stomach. BOWEL: Nonobstructive bowel gas pattern. SOFT TISSUES: Cholecystectomy clips noted. Severe right hydroureteronephrosis with residual contrast from prior CT. BONES: No acute fracture. IMPRESSION: 1. Enteric tube in appropriate position with tip in the distal stomach. 2. Severe right hydroureteronephrosis with residual contrast from prior CT. Electronically signed by: Pinkie Pebbles MD 09/01/2024 06:33 PM EST RP Workstation: [id number]   DG Chest Port 1 View Result Date: 09/01/2024 EXAM: 1 VIEW(S) XRAY OF THE CHEST 09/01/2024 05:27:00 PM COMPARISON: 09/01/2024 CLINICAL HISTORY: Encounter for orogastric (OG) tube placement. FINDINGS: LINES, TUBES AND DEVICES: Endotracheal tube in place with tip 2 cm above the carina. Enteric tube in place, courses below the diaphragm with tip out of field of view. Left IJ CVC in place with tip in upper SVC. LUNGS AND PLEURA: Elevated right hemidiaphragm. Right basilar atelectasis. No focal pulmonary opacity. No pleural effusion. No pneumothorax. HEART AND MEDIASTINUM: No acute abnormality of the cardiac and mediastinal silhouettes. BONES AND SOFT TISSUES: No acute osseous abnormality. IMPRESSION: 1. Endotracheal tube with tip 2cm above the carina. 2. Additional support apparatus as above. 3. Right basilar atelectasis. Electronically signed by: Pinkie Pebbles MD 09/01/2024 06:32 PM EST RP Workstation: [id number]   DG Chest Port 1 View Result Date: 09/01/2024 CLINICAL DATA:  Aspiration EXAM: PORTABLE CHEST 1 VIEW COMPARISON:  Three days ago FINDINGS: Stable cardiomediastinal silhouette. Both lungs are clear. Old left  rib fractures. IMPRESSION: No active disease. Electronically Signed   By: Lynwood Landy Raddle M.D.   On: 09/01/2024 14:40   CT CHEST ABDOMEN PELVIS W CONTRAST Result Date: 08/31/2024 CLINICAL DATA:  Fever of unknown origin. EXAM: CT CHEST, ABDOMEN, AND PELVIS WITH  CONTRAST TECHNIQUE: Multidetector CT imaging of the chest, abdomen and pelvis was performed following the standard protocol during bolus administration of intravenous contrast. RADIATION DOSE REDUCTION: This exam was performed according to the departmental dose-optimization program which includes automated exposure control, adjustment of the mA and/or kV according to patient size and/or use of iterative reconstruction technique. CONTRAST:  OMNIPAQUE  IOHEXOL  300 MG/ML  SOLN COMPARISON:  Chest CTA on 03/05/2022, and AP CT on 02/27/2014 FINDINGS: CT CHEST FINDINGS Cardiovascular: No acute findings. Mediastinum/Lymph Nodes: No masses or pathologically enlarged lymph nodes identified. Lungs/Pleura: No suspicious pulmonary nodules or masses identified. No evidence of infiltrate or pleural effusion. Musculoskeletal: No suspicious bone lesions identified. Several old left lateral rib fracture deformities are noted. CT ABDOMEN AND PELVIS FINDINGS Hepatobiliary: No masses identified. Small cyst in the inferior right hepatic lobe is again noted. Gallbladder is unremarkable. No evidence of biliary ductal dilatation. Pancreas:  No mass or inflammatory changes. Spleen:  Within normal limits in size and appearance. Adrenals/Urinary tract: No No evidence of adrenal or renal masses. Severe right hydronephrosis and proximal ureterectasis is again seen with transition to nondilated ureter at the level of the crossing iliac vessels. No obstructing ureteral calculus or mass identified. Mild left hydronephrosis and proximal ureterectasis is new since 2023 exam, without No evidence of obstructing calculus or mass. This may be due to postoperative scarring related to previous colectomy and ileorectal anastomosis. Urinary bladder appears tethered anteriorly and superiorly, likely due to postop scarring, but is otherwise unremarkable. Stomach/Bowel: Postoperative changes are again seen from subtotal colectomy with ileorectal  anastomosis. A small well-circumscribed gas collection is seen in the adjacent presacral region measuring 2.4 x 2.0 cm, where a percutaneous drainage catheter was previously located. No residual fluid or acute inflammatory changes identified. No evidence of bowel obstruction. Vascular/Lymphatic: No pathologically enlarged lymph nodes identified. No acute vascular findings. Reproductive:  No mass or other significant abnormality identified. Other:  None. Musculoskeletal:  No suspicious bone lesions identified. IMPRESSION: No evidence of acute inflammatory process or abscess. Stable severe right hydronephrosis and proximal ureterectasis, and new mild left hydronephrosis and proximal ureterectasis. No obstructing calculus or mass identified. This is likely due to postop scarring related to previous bowel surgery. Decreased size of small presacral gas collection, where a percutaneous drainage catheter was previously located. This is also likely postop in etiology. Electronically Signed   By: Norleen DELENA Kil M.D.   On: 08/31/2024 17:53   DG FL GUIDED LUMBAR PUNCTURE Result Date: 08/31/2024 CLINICAL DATA:  68 year old female with a history of alcohol  use disorder who presented to the ED with lower extremity swelling and confusion. Currently admitted with acute metabolic encephalopathy of unclear origin and being treated for alcohol  withdrawal, UTI, parainfluenza, and left leg infection. Request for lumbar puncture. EXAM: LUMBAR PUNCTURE UNDER FLUOROSCOPY PROCEDURE: An appropriate skin entry site was determined fluoroscopically. Operator donned sterile gloves and mask. Skin site was marked, then prepped with Betadine , draped in usual sterile fashion, and infiltrated locally with 1% lidocaine . A 20 gauge 6-inch spinal needle advanced into the thecal sac at L5-S1 from a right interlaminar approach. Blood was noted to the hub of the needle; this was cleaned out and needle placement adjusted sightly, but again only  blood  returned into the hub. A second attempt was made at the same level with a new needle, but only a small amount of blood returned into the hub. A third attempt to was made with a new 20 gauge 6-inch spinal needle and advanced into the thecal sac at L3-4 from a right interlaminar approach. Initially a small amount of clear colorless CSF spontaneously returned, but was quickly followed by bloody output in the hub. This eventually clotted and obstructed the flow of CSF. An attempt was made to clean the needle out and reposition, but only a small amount of bloody return noted in the hub. Dr. Marcelino was then called into the room for additional assistance. A fourth and final attempt was made with a new 20 gauge 6-inch spinal needle at the L4-L5 level from a right interlaminar approach. Unfortunately, despite reassuring imaging, there was no return of CSF. The procedure was aborted and the needle was then removed. The patient tolerated the procedure well and there were no complications. FLUOROSCOPY: Radiation Exposure Index (as provided by the fluoroscopic device): 165.5 mGy Kerma IMPRESSION: Technically unsuccessful lumbar puncture under fluoroscopy with multiple attempts at repositioning of the spinal needle. No CSF obtained. This exam was performed by [facility] PA-C, and was supervised and interpreted by Dr. Harrietta Marcelino. Electronically Signed   By: Harrietta Marcelino M.D.   On: 08/31/2024 16:42    Medications: I have reviewed the patient's current medications. Scheduled:  Chlorhexidine  Gluconate Cloth  6 each Topical Daily   docusate  100 mg Per Tube BID   [START ON 09/03/2024] escitalopram   10 mg Per Tube Daily   feeding supplement  237 mL Per Tube BID BM   [START ON 09/03/2024] folic acid   1 mg Per Tube Daily   heparin  injection (subcutaneous)  5,000 Units Subcutaneous Q8H   hydrocortisone  sod succinate (SOLU-CORTEF ) inj  50 mg Intravenous Q6H   insulin  aspart  0-24 Units Subcutaneous Q4H   [START  ON 09/03/2024] multivitamin with minerals  1 tablet Per Tube Daily   mupirocin  ointment  1 Application Nasal BID   nystatin    Topical BID   mouth rinse  15 mL Mouth Rinse 4 times per day   mouth rinse  15 mL Mouth Rinse Q2H   pantoprazole  (PROTONIX ) IV  40 mg Intravenous Daily   polyethylene glycol  17 g Per Tube Daily    Assessment/Plan: 68 y.o. female who due to mental status is unable to provide any history therefore all history obtained from the chart.   Patient with medical history significant of alcohol  use disorder, dCHF, CAD, depression with anxiety, morbid obesity, ulcerative colitis, NSVT, PSVT, varicose vein in both legs, carotid artery stenosis, morbid obesity, who presented initially with left leg swelling and pain.  Developed confusion in the ED.  Initially was not febrile but developed fevers while hospitalized.  Was found to have a UTI and there was infection of the left leg as well.  There was question of PNA but this has been ruled out.  Positive for Parainfluenza virus.   Placed on CIWA protocol on admission.  Has been on Zithromycin, Ceftriaxone , Vancomycin , Zosyn  during her stay.  Currently afebrile, wbc count elevated.  Found to have aspirated leading to her ICU admission.  MRI of the brain during this admission unremarkable.  EEG only significant for slowing.  LP unable to be performed.   B12, TSH, folate are normal.  B1 pending.  Has received multiple doses of high dose thiamine   with no benefit.  Although this may still be from her ETOH withdrawal, can not rule out a partially treated meningitis.  No evidence of a post viral encephalitis on imaging.  ID involvement to determine if patient should be covered with antibiotics to treat meningitis.    Plan: Will continue to follow with you   LOS: 18 days   Sonny Hock, MD Neurology  09/02/2024  12:50 PM

## 2024-09-02 NOTE — Plan of Care (Signed)
 Palliative consult received.  Visited with Haley Deleon at her bedside, she remains intubated, sedation off for WUA--not following commands. No family at bedside. ICU RN spoke with granddaughter who plans to visit bedside this afternoon--RN to alert PMT provider once granddaughter arrived.  No message received from RN. Rounded at bedside a few times throughout shift, no visitors at bedside. Will attempt to set time to meet with granddaughter at bedside tomorrow.  No Charge.  Haley Lesches, DNP, AGNP-C Palliative Medicine  Please call Palliative Medicine team phone with any questions [phone]. For individual providers please see AMION.

## 2024-09-02 NOTE — Procedures (Signed)
 Patient Name: Haley Deleon  MRN: [id number]  Epilepsy Attending: Arlin MALVA Krebs  Referring Physician/Provider: Germaine Raring, MD  Date: 09/01/2024 Duration: 29.21 mins  Patient history: 68yo F with ams. EEG to evaluate for seizure  Level of alertness: comatose/ lethargic   AEDs during EEG study: Propofol   Technical aspects: This EEG study was done with scalp electrodes positioned according to the 10-20 International system of electrode placement. Electrical activity was reviewed with band pass filter of 1-70Hz , sensitivity of 7 uV/mm, display speed of 67mm/sec with a 60Hz  notched filter applied as appropriate. EEG data were recorded continuously and digitally stored.  Video monitoring was available and reviewed as appropriate.  Description: EEG showed continuous generalized 3 to 6 Hz theta-delta slowing. Hyperventilation and photic stimulation were not performed.     ABNORMALITY - Continuous slow, generalized  IMPRESSION: This study is suggestive of generalized cerebral dysfunction ( encephalopathy). No seizures or epileptiform discharges were seen throughout the recording.  Kanya Potteiger O Terrianne Cavness

## 2024-09-03 LAB — URINE CULTURE: Culture: NO GROWTH

## 2024-09-03 LAB — GLUCOSE, CAPILLARY
Glucose-Capillary: 207 mg/dL — ABNORMAL HIGH (ref 70–99)
Glucose-Capillary: 198 mg/dL — ABNORMAL HIGH (ref 70–99)
Glucose-Capillary: 227 mg/dL — ABNORMAL HIGH (ref 70–99)
Glucose-Capillary: 194 mg/dL — ABNORMAL HIGH (ref 70–99)
Glucose-Capillary: 246 mg/dL — ABNORMAL HIGH (ref 70–99)
Glucose-Capillary: 266 mg/dL — ABNORMAL HIGH (ref 70–99)

## 2024-09-03 LAB — CBC
HCT: 38.4 % (ref 36.0–46.0)
Hemoglobin: 12.6 g/dL (ref 12.0–15.0)
MCH: 30 pg (ref 26.0–34.0)
MCHC: 32.8 g/dL (ref 30.0–36.0)
MCV: 91.4 fL (ref 80.0–100.0)
Platelets: 187 K/uL (ref 150–400)
RBC: 4.2 MIL/uL (ref 3.87–5.11)
RDW: 13.3 % (ref 11.5–15.5)
WBC: 21.1 K/uL — ABNORMAL HIGH (ref 4.0–10.5)
nRBC: 0 % (ref 0.0–0.2)

## 2024-09-03 LAB — BASIC METABOLIC PANEL WITH GFR
Anion gap: 15 (ref 5–15)
BUN: 38 mg/dL — ABNORMAL HIGH (ref 8–23)
CO2: 21 mmol/L — ABNORMAL LOW (ref 22–32)
Calcium: 8.9 mg/dL (ref 8.9–10.3)
Chloride: 106 mmol/L (ref 98–111)
Creatinine, Ser: 1.65 mg/dL — ABNORMAL HIGH (ref 0.44–1.00)
GFR, Estimated: 34 mL/min — ABNORMAL LOW (ref >60)
Glucose, Bld: 210 mg/dL — ABNORMAL HIGH (ref 70–99)
Potassium: 4 mmol/L (ref 3.5–5.1)
Sodium: 141 mmol/L (ref 135–145)

## 2024-09-03 LAB — VITAMIN B1: Vitamin B1 (Thiamine): 271.4 nmol/L — ABNORMAL HIGH (ref 66.5–200.0)

## 2024-09-03 MED ORDER — INSULIN GLARGINE 100 UNIT/ML ~~LOC~~ SOLN
8.0000 [IU] | Freq: Every day | SUBCUTANEOUS | Status: DC
  Administered 2024-09-03 – 2024-09-05 (×3): 8 [IU] via SUBCUTANEOUS
  Filled 2024-09-03 (×3): qty 0.08

## 2024-09-03 NOTE — Progress Notes (Signed)
 NAME:  Haley Deleon, MRN:  [id number], DOB:  1956/07/27, LOS: 19 ADMISSION DATE:  08/15/2024, CHIEF COMPLAINT:  Encephalopathy, respiratory failure   History of Present Illness:  Haley Deleon is a 68 y.o. female with medical history significant of alcohol  use disorder, dCHF, CAD, depression with anxiety, morbid obesity, ulcerative colitis, NSVT, PSVT, varicose vein in both legs, carotid artery stenosis, morbid obesity, who presents with left leg swelling and pain, confusion.   Patient is currently altered, therefore history is obtained from chart review.   It is reported patient initially presented to ED with left leg swelling and pain, and then she developed confusion in ED. When I saw patient in ED, she is alert, orientated to place and person, not to the time. She moves all extremities normally. No facial droop or slurred speech. She has bilateral lower leg swelling, the left leg is much worse than the right. She does not have pain in the right leg. She denies fall or injury. No fever or chills. She has a varicose vein in both legs. Patient denies chest pain, cough, SOB. No nausea, vomiting, diarrhea or abdominal pain. No symptoms UTI. Patient has candidiasis in groin areas. Per her granddaughter at the bedside, patient used to drink liquor almost every day. Recently she drinks less, and last drinking was several days ago. Patient states that she only drinks once a week recently.   Pertinent  Medical History  -EtOH abuse -Carotid arterial disease -CAD -HFpEF -Ulcerative Colitis  Significant Hospital Events: Including procedures, antibiotic start and stop dates in addition to other pertinent events   11/25: admitted to hospitalist w/ acute metabolic encephalopathy EtOH withdrawal, UTI.  11/26: remains confused / hallucinating this morning. Continuing withdrawal protocols  11/27: remains confused but is more calm today. Urine culture still pending.  11/28: remains confused but is more  calm today and bit more conversant but not making sense. Urine culture still pending - staff has confirmed w/ lab that it is in process today as of 05:00 11/29: more alert but remains confused. PT/OT ordered for tomorrow. UCX pend susceptibilities  11/30: Proteus and Ecoli both sensitive to rocephin . Febrile this AM so will leave on abx, CXR read as possible R mid-lung atelectasis / early infection, also vascular congestion. Gave dose Lasix  and will add azithro for possible PNA 12/01: continues to be febrile, more oriented but still confused / not conversational so difficult history. No skin lesions. Added vancomycin  given (+)MRSA screen and skin erythema groin, but likely d/t parainfluenza virus 12/02: febrile but improved, remains somewhat confused, continue abx 12/9: ID consulted for AMS and recurrent fevers after initial treatment of UTI and CAP. 12/10: Neurology consulted for AMS. defervescing. Some improvement Mental status. UA not a clean catch but mixed. Some cough today. MRI head neg. Neck is supple so doubt meningitis and the waxing and waning mental status would be unusual for bacterial meningitis  12/11: Afebrile, more alert but remains confused.  Coughing on exam, Unsuccessful LP attempt.  12/12: Remains encephalopathic, high risk for aspiration.  PCCM consulted to evaluate ability to protect airway and possible need for intubation.  Palliative Care consulted. 12/13: ordered for BLE dopplers for mottling L>R, attempting to wean pressors (vaso/Levo). Started on IV hydrocortisone  q6h for septic shock. CXR with basilar atelectasis. Initiated enteral feeds via NGT. Urine culture pending.  12/14: encephalopathic, discontinued vasopressin   Interim History / Subjective:  Intubated, remains unresponsive  Objective    Blood pressure 112/65, pulse 85, temperature 98.6 F (  37 C), temperature source Axillary, resp. rate 20, height 5' 7 (1.702 m), weight 117.1 kg, SpO2 97%.    Vent Mode:  PRVC FiO2 (%):  [30 %] 30 % Set Rate:  [15 bmp] 15 bmp Vt Set:  [450 mL] 450 mL PEEP:  [5 cmH20] 5 cmH20   Intake/Output Summary (Last 24 hours) at 09/03/2024 0848 Last data filed at 09/03/2024 0800 Gross per 24 hour  Intake 2075.68 ml  Output 885 ml  Net 1190.68 ml   Filed Weights   08/31/24 0424 09/02/24 0500 09/03/24 0447  Weight: 111.7 kg 116.9 kg 117.1 kg    Examination: Physical Exam Constitutional:      Appearance: She is obese. She is ill-appearing.  Cardiovascular:     Rate and Rhythm: Normal rate and regular rhythm.     Pulses: Normal pulses.     Heart sounds: Normal heart sounds.  Pulmonary:     Comments: Ventilated breath sounds bilaterally Neurological:     Mental Status: She is disoriented.     Comments: Opens eyes to tactile stimuli, does not follow commands      Assessment and Plan   68 year old female with history of chronic alcohol  use who presented to the hospital with lower extremity pain, found to be encephalopathic for which she was admitted. She developed aspiration pneumonia resulting in respiratory failure requiring intubation and admission to the ICU.   #Acute Hypoxic Respiratory Failure #Aspiration Pneumonia #Septic Shock #Rhabdomyolysis #AKI  #Toxic metabolic encephalopathy   Neuro - history of chronic alcohol  use and developed alcohol  withdrawal (required phenobarbital  11/25 and lorazepam  11/26), remains encephalopathic with attempted LP unsuccessful by IR (would recommend re-attempting on Monday) as well as EEG only showing slowing/encephalopathy and unremarkable imaging. Would recommend sending infectious/auto-immune/paraneoplastic panels on LP fluid once obtained. Continue high dose thiamine  and folate.   CV - Septic shock is worsened post intubation requiring nor-epinephrine  and vasopressin . Nor-epinephrine  requirements are improved and vasopressin  has been discontinued this AM. Goal MAP > 65 mmHg. Check lower extremity ultrasound and  dopplers to assess for PAD (result pending). TTE ordered.    Pulm - respiratory failure secondary to aspiration pneumonia that was precipitated by her persistent encephalopathy. Copious secretions noted post intubation with bronchoscopy performed for therapeutic aspiration of secretions. Interestingly, her chest CT 12/11 did not show any sign of pneumonia. She is currently with minimal vent settings. SBT with improvement of her secretion burden and improvement in her shock. Para-influenza infection (12/1) was managed symptomatically.   GI - starting tube feeds. PPI for SUP   Renal - Workup has included a CT scan of the chest/abdomen/pelvis (does have a chronic right sided severe hydronephrosis and new left-sided hydronephrosis). Urology consulted who felt this is chronic and would benefit from outpatient workup. Despite lack of fat stranding and radiographic sign of kidney infection, would continue to closely monitor this as she could be developing worsening obstruction and/or infection. CK's elevated but have trended downwards.   Endo - ICU glycemic protocol   Hem/Onc - heparin  subQ for DVT prophylaxis   ID - on broad spectrum antibiotics for management of aspiration pneumonia with pseudomonal and MRSA coverage (Zosyn /Linezolid ). Respiratory cultures sent from bronchial washings. Would recommend reattempted LP with spinal fluid sent to assess for infectious, autoimmune, and paraneoplastic causes of encephalitis (send out to [facility]).   Labs   CBC: Recent Labs  Lab 08/29/24 0434 09/01/24 0522 09/01/24 1838 09/02/24 0425 09/03/24 0429  WBC 9.7 13.6* 22.8* 34.2* 21.1*  HGB 14.7 14.9 14.7 14.4 12.6  HCT 44.9 45.7 45.6 45.6 38.4  MCV 90.9 92.3 92.7 94.0 91.4  PLT 353 230 240 229 187    Basic Metabolic Panel: Recent Labs  Lab 08/30/24 1045 09/01/24 0522 09/01/24 1838 09/02/24 0425 09/03/24 0429  NA 141 141 142 138 141  K 3.9 3.8 3.7 3.5 4.0  CL 105 107 105 104 106  CO2 22 21* 21*  19* 21*  GLUCOSE 150* 188* 213* 249* 210*  BUN 20 28* 30* 33* 38*  CREATININE 0.74 1.01* 1.20* 1.86* 1.65*  CALCIUM 9.6 9.5 9.2 9.3 8.9  PHOS  --   --  4.4  --   --    GFR: Estimated Creatinine Clearance: 43.8 mL/min (A) (by C-G formula based on SCr of 1.65 mg/dL (H)). Recent Labs  Lab 08/28/24 1340 08/29/24 0434 09/01/24 0522 09/01/24 1838 09/01/24 2114 09/02/24 0425 09/02/24 1336 09/03/24 0429  PROCALCITON 0.10  --   --   --   --   --   --   --   WBC  --    < > 13.6* 22.8*  --  34.2*  --  21.1*  LATICACIDVEN  --   --   --  1.4 4.4*  --  1.9  --    < > = values in this interval not displayed.    Liver Function Tests: Recent Labs  Lab 08/29/24 0434 08/30/24 1045 09/01/24 0522 09/01/24 1838 09/02/24 0425  AST 71* 64* 39  --  61*  ALT 71* 73* 53*  --  89*  ALKPHOS 109 112 115  --  146*  BILITOT 0.7 1.0 1.0  --  0.9  PROT 7.1 7.5 7.6  --  7.1  ALBUMIN 3.5 3.5 3.6 3.5 3.3*   No results for input(s): LIPASE, AMYLASE in the last 168 hours. Recent Labs  Lab 08/29/24 0434  AMMONIA 25    ABG    Component Value Date/Time   PHART 7.34 (L) 09/02/2024 0457   PCO2ART 34 09/02/2024 0457   PO2ART 73 (L) 09/02/2024 0457   HCO3 18.3 (L) 09/02/2024 0457   ACIDBASEDEF 6.6 (H) 09/02/2024 0457   O2SAT 97.8 09/02/2024 0457     Coagulation Profile: No results for input(s): INR, PROTIME in the last 168 hours.  Cardiac Enzymes: Recent Labs  Lab 09/02/24 1335 09/02/24 2120  CKTOTAL 630* 562*    HbA1C: Hgb A1c MFr Bld  Date/Time Value Ref Range Status  02/24/2021 02:45 AM 5.3 4.8 - 5.6 % Final    Comment:    (NOTE)         Prediabetes: 5.7 - 6.4         Diabetes: >6.4         Glycemic control for adults with diabetes: <7.0     CBG: Recent Labs  Lab 09/02/24 1534 09/02/24 1911 09/02/24 2321 09/03/24 0323 09/03/24 0737  GLUCAP 196* 190* 203* 207* 198*    Review of Systems:   N/A  Past Medical History:  She,  has a past medical history of  Alcohol  use disorder, Anemia, Atypical chest pain, Carotid arterial disease, Claustrophobia, Coronary artery disease, Diastolic dysfunction, Dysrhythmia, Family history of adverse reaction to anesthesia, Heart murmur, Liver cyst, Pneumonia, Ulcerative colitis (HCC), and Venous insufficiency.   Surgical History:   Past Surgical History:  Procedure Laterality Date   BREAST BIOPSY Right 11/02/2011   ultrasound guided biopsy/clip-benign   CHOLECYSTECTOMY N/A 07/29/2017   Procedure: LAPAROSCOPIC CHOLECYSTECTOMY;  Surgeon: Jordis Laneta FALCON,  MD;  Location: [facility] ORS;  Service: General;  Laterality: N/A;   COLON SURGERY  2014, 2015   DUE TO ULERATIVE COLITIS WITH COLOSTOMY   COLOSTOMY TAKEDOWN     FOOT SURGERY Bilateral    BONE SPURS    HYSTEROSCOPY WITH D & C N/A 09/11/2022   Procedure: FRACTIONAL DILATATION AND CURETTAGE /HYSTEROSCOPY;  Surgeon: Schermerhorn, Debby PARAS, MD;  Location: [facility] ORS;  Service: Gynecology;  Laterality: N/A;   MANDIBLE FRACTURE SURGERY  1991   MOUTH SURGERY     VENTRAL HERNIA REPAIR N/A 07/29/2017   Procedure: LAPAROSCOPIC VENTRAL HERNIA;  Surgeon: Jordis Laneta FALCON, MD;  Location: [facility] ORS;  Service: General;  Laterality: N/A;     Social History:   reports that she quit smoking about 19 years ago. Her smoking use included cigarettes. She started smoking about 39 years ago. She has a 20 pack-year smoking history. She has never used smokeless tobacco. She reports current alcohol  use. She reports that she does not use drugs.   Family History:  Her family history includes Heart attack (age of onset: 17) in her mother; Hyperlipidemia in her brother. There is no history of Breast cancer.   Allergies Allergies[1]   Home Medications  Prior to Admission medications  Medication Sig Start Date End Date Taking? Authorizing Provider  escitalopram  (LEXAPRO ) 10 MG tablet Take 10 mg by mouth daily.   Yes [provider]  ferrous sulfate 325 (65 FE) MG tablet Take 325 mg by  mouth daily as needed.    Yes [provider]  loperamide  (IMODIUM ) 2 MG capsule Take 2 mg as needed by mouth for diarrhea or loose stools (NORMALLY TAKES 2-3 TIMES DAILY SINCE HAVING HER COLON SURGERY).    Yes [provider]  Multiple Vitamin (MULTIVITAMIN) tablet Take 1 tablet by mouth daily.   Yes [provider]  gabapentin  (NEURONTIN ) 300 MG capsule Take 1 capsule by mouth at bedtime. Patient not taking: Reported on 08/16/2024    [provider]  hydrOXYzine  (ATARAX ) 10 MG tablet Take 10 mg by mouth 3 (three) times daily as needed for anxiety. Patient not taking: Reported on 08/16/2024    [provider]     The patient is critically ill due to toxic metabolic encephalopathy, acute hypoxic respiratory failure, aspiration pneumonia.  Critical care was necessary to treat or prevent imminent or life-threatening deterioration. I personally performed high risk medication and infusion titration and management, titration, monitoring, and management of vasopressor/ionotrope infusion, and mechanical ventilation management and titration. Critical care time was spent by me on the following activities: development of a treatment plan with the patient and/or surrogate as well as nursing, discussions with consultants, evaluation of the patient's response to treatment, examination of the patient, obtaining a history from the patient or surrogate, ordering and performing treatments and interventions, ordering and review of laboratory studies, ordering and review of radiographic studies, review of telemetry data including pulse oximetry, re-evaluation of patient's condition and participation in multidisciplinary rounds.   I personally spent 43 minutes providing critical care not including any separately billable procedures.   [name], MD [facility] 09/03/2024 8:57 AM                [1]  Allergies Allergen Reactions    Caffeine Anaphylaxis and Other (See Comments)   Chocolate Anaphylaxis   Chocolate Flavoring Agent (Non-Screening) Anaphylaxis   Cocoa Anaphylaxis   Meloxicam Other (See Comments)    migraine and bleeding  Severe headache  meloxicam

## 2024-09-03 NOTE — Plan of Care (Signed)

## 2024-09-03 NOTE — Consult Note (Signed)
 Consultation Note Date: 09/03/2024   Patient Name: Haley Deleon  DOB: May 08, 1956  MRN: [id number]  Age / Sex: 68 y.o., female  PCP: Haley Tawni KIDD, MD Referring Physician: Isadora Hose, MD  Reason for Consultation: Establishing goals of care   HPI/Brief Hospital Course: 68 y.o. female  with past medical history of alcohol  use disorder, CHF, CAD, depression and anxiety, morbid obesity, ulcerative colitis, in SVT, PSVT, varicose veins, carotid artery stenosis admitted from home on 08/15/2024 with left leg swelling and pain.  Soon after arrival to ED became altered and confused.  Hospital course has been complicated, has received treatment for left leg infection, UTI and parainfluenza virus.  Concern related to ongoing and worsening encephalopathy--neurology following, has received high-dose thiamine .  LP has been attempted unsuccessfully, plan to repeat on 05-Jan-Deleon.  Hospital course further complicated by rapid response being called 12/12 related to likely aspiration event resulting in transfer to ICU and need for endotracheal intubation.  Sedation has been turned off since 12/13 AM--opens eyes intermittently but unable to track, not able to follow commands.  Palliative medicine was consulted for assisting with goals of care conversations.  Subjective:  Extensive chart review has been completed prior to meeting patient including labs, vital signs, imaging, progress notes, orders, and available advanced directive documents from current and previous encounters.  Visited with Haley Deleon at her bedside.  She remains intubated without sedation, unable to engage or follow commands.  Two granddaughters at bedside during time of visit Haley Deleon and Haley Deleon.  Met with Haley Deleon outside of patient room in private Deleon meeting room.  Introduced myself as a publishing rights manager as a member of the palliative care team. Explained palliative medicine is specialized medical  care for people living with serious illness. It focuses on providing relief from the symptoms and stress of a serious illness. The goal is to improve quality of life for both the patient and the Deleon.   Haley Deleon shares she is 68 years old and her younger sister Haley Deleon is only 76 years old.  Their mother, Haley Deleon's daughter tragically passed away in 2020/09/25.  At that time Haley Deleon was given custody of Haley Deleon.  Haley Deleon shares since the passing of their mother in 09-25-2020, Haley Deleon utilized heavy use of alcohol  consumption.  Haley Deleon reports Haley Deleon consuming 3-4 pitchers of margaritas per day every day since the passing of their mother in 25-Sep-2020.  Haley Deleon reports Haley Deleon was consuming alcohol  at a [employer] right before coming to the ED for medical evaluation.  Haley Deleon shares heavy alcohol  consumption started while Haley Deleon was still working full-time. 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  Haley Deleon shares Ms. Matzen has a sister-Haley Deleon who lives in [place] .  Haley Deleon shares Deleon has appointed her as runner, broadcasting/film/video but Haley Deleon is in constant communication with her aunt Haley Deleon and includes her and decision making.  Haley Deleon also shares Haley Deleon has a long-term significant other-Haley Deleon, never legally married.  Haley Deleon able to share her understanding of Haley Deleon's current medical condition.  We reviewed overall hospital course and most recent medical updates.  Concern remains for ongoing encephalopathy without significant improvement.  Overall concern admit sent for his ability to be liberated from mechanical ventilation with significant encephalopathy.  Discussed LP to be reattempted on Haley Deleon for further  investigation into etiology of encephalopathy.  We discussed potentially related to chronic heavy  EtOH consumption.  We discussed patient's current illness and what it means in the larger context of patient's on-going co-morbidities. Natural disease trajectory and expectations at EOL were discussed.   Attempted to elicit goals of care.  We discussed CODE STATUS and the difference between full code and DO NOT RESUSCITATE. Encouraged Deleon to consider DNR/DNI status understanding evidenced based poor outcomes in similar hospitalized patients, as the cause of the arrest is likely associated with chronic/terminal disease rather than a reversible acute cardio-pulmonary event.  At this time Haley Deleon wishes for Haley Deleon to remain full code.  She speaks to trying to come to terms with understanding the severity of Haley Deleon's condition and ultimately would not want her grandmother to suffer.  We spoke to long-term goals in the event Haley Deleon unable to be successfully liberated from mechanical ventilation.  We discussed long-term ventilation through tracheostomy for which Haley Deleon shares she feels Haley Deleon will be in agreement to not be accepting of tracheostomy placement.  Haley Deleon wishes to give Haley Deleon more time for recovery at this time.  I discussed importance of continued conversations with Deleon/support persons and all members of their medical team regarding overall plan of care and treatment options ensuring decisions are in alignment with patients goals of care.  All questions/concerns addressed. Emotional support provided to patient/Deleon/support persons. PMT will continue to follow and support patient as needed.  Objective: Primary Diagnoses: Present on Admission:  Alcohol  withdrawal (HCC)  Ulcerative colitis (HCC)  Chronic diastolic CHF (congestive heart failure) (HCC)  Intertriginous candidiasis  Depression  Pain and swelling of left lower leg  Obesity, Class III, BMI 40-49.9 (morbid obesity) (HCC)  UTI (urinary tract infection)  Acute metabolic  encephalopathy   Physical Exam Constitutional:      General: She is not in acute distress.    Appearance: She is ill-appearing.  HENT:     Head: Normocephalic.     Mouth/Throat:     Mouth: Mucous membranes are dry.  Pulmonary:     Effort: Pulmonary effort is normal. No respiratory distress.  Abdominal:     General: There is no distension.     Palpations: Abdomen is soft.     Tenderness: There is no abdominal tenderness.  Skin:    General: Skin is warm and dry.     Findings: Bruising present.  Neurological:     Comments: Opens eyes, no tracking, unable to follow commands     Vital Signs: BP 101/69   Pulse 83   Temp 100 F (37.8 C) (Axillary)   Resp 20   Ht 5' 7 (1.702 m)   Wt 117.1 kg   SpO2 98%   BMI 40.43 kg/m  Pain Scale: CPOT POSS *See Group Information*: 1-Acceptable,Awake and alert Pain Score: 0-No pain  IO: Intake/output summary:  Intake/Output Summary (Last 24 hours) at 09/03/2024 1815 Last data filed at 09/03/2024 1600 Gross per 24 hour  Intake 1416.71 ml  Output 635 ml  Net 781.71 ml    LBM: Last BM Date : 09/01/24 Baseline Weight: Weight: 120.2 kg Most recent weight: Weight: 117.1 kg      Assessment and Plan  SUMMARY OF RECOMMENDATIONS   Full Code Full Scope Time for outcomes Overall poor prognosis Ongoing GOC discussions  Palliative Prophylaxis:   Bowel Regimen, Delirium Protocol and Frequent Pain Assessment  Discussed With: CCM team and nursing staff   Thank you for  this consult and allowing Palliative Medicine to participate in the care of Ariannie C. Dowler. Palliative medicine will continue to follow and assist as needed.   Visit includes: Detailed review of medical records (labs, imaging, vital signs), medically appropriate exam (mental status, respiratory, cardiac, skin), discussed with treatment team, counseling and educating patient, Deleon and staff, documenting clinical information, medication management and coordination of  care.   Signed by: Waddell Lesches, DNP, AGNP-C Palliative Medicine    Please contact Palliative Medicine Team phone at [phone] for questions and concerns.  For individual provider: See Tracey

## 2024-09-03 NOTE — Plan of Care (Signed)
°  Problem: Clinical Measurements: Goal: Ability to maintain clinical measurements within normal limits will improve Outcome: Progressing Goal: Will remain free from infection Outcome: Progressing Goal: Diagnostic test results will improve Outcome: Progressing Goal: Respiratory complications will improve Outcome: Progressing Goal: Cardiovascular complication will be avoided Outcome: Progressing   Problem: Nutrition: Goal: Adequate nutrition will be maintained Outcome: Progressing   Problem: Coping: Goal: Level of anxiety will decrease Outcome: Progressing   Problem: Pain Managment: Goal: General experience of comfort will improve and/or be controlled Outcome: Progressing   Problem: Safety: Goal: Ability to remain free from injury will improve Outcome: Progressing   Problem: Respiratory: Goal: Ability to maintain a clear airway and adequate ventilation will improve Outcome: Progressing

## 2024-09-04 ENCOUNTER — Inpatient Hospital Stay
Admit: 2024-09-04 | Discharge: 2024-09-04 | Disposition: A | Discharge status: Home / Self Care | Attending: Student in an Organized Health Care Education/Training Program | Admitting: Student in an Organized Health Care Education/Training Program

## 2024-09-04 ENCOUNTER — Inpatient Hospital Stay

## 2024-09-04 DIAGNOSIS — J9691 Respiratory failure, unspecified with hypoxia: Secondary | ICD-10-CM

## 2024-09-04 DIAGNOSIS — Z930 Tracheostomy status: Secondary | ICD-10-CM | POA: Diagnosis not present

## 2024-09-04 DIAGNOSIS — B9562 Methicillin resistant Staphylococcus aureus infection as the cause of diseases classified elsewhere: Secondary | ICD-10-CM | POA: Diagnosis not present

## 2024-09-04 DIAGNOSIS — B965 Pseudomonas (aeruginosa) (mallei) (pseudomallei) as the cause of diseases classified elsewhere: Secondary | ICD-10-CM

## 2024-09-04 DIAGNOSIS — J9601 Acute respiratory failure with hypoxia: Secondary | ICD-10-CM

## 2024-09-04 DIAGNOSIS — J9692 Respiratory failure, unspecified with hypercapnia: Secondary | ICD-10-CM

## 2024-09-04 DIAGNOSIS — J690 Pneumonitis due to inhalation of food and vomit: Secondary | ICD-10-CM | POA: Diagnosis not present

## 2024-09-04 LAB — CSF CELL COUNT WITH DIFFERENTIAL
Eosinophils, CSF: 0 %
Lymphs, CSF: 1 %
Monocyte-Macrophage-Spinal Fluid: 0 %
RBC Count, CSF: 39510 /mm3 — ABNORMAL HIGH (ref 0–3)
Segmented Neutrophils-CSF: 99 %
Tube #: 1
WBC, CSF: 224 /mm3 (ref 0–5)

## 2024-09-04 LAB — CBC
HCT: 34.4 % — ABNORMAL LOW (ref 36.0–46.0)
Hemoglobin: 11.1 g/dL — ABNORMAL LOW (ref 12.0–15.0)
MCH: 29.8 pg (ref 26.0–34.0)
MCHC: 32.3 g/dL (ref 30.0–36.0)
MCV: 92.2 fL (ref 80.0–100.0)
Platelets: 183 K/uL (ref 150–400)
RBC: 3.73 MIL/uL — ABNORMAL LOW (ref 3.87–5.11)
RDW: 13.5 % (ref 11.5–15.5)
WBC: 15.6 K/uL — ABNORMAL HIGH (ref 4.0–10.5)
nRBC: 0 % (ref 0.0–0.2)

## 2024-09-04 LAB — GLUCOSE, CAPILLARY
Glucose-Capillary: 221 mg/dL — ABNORMAL HIGH (ref 70–99)
Glucose-Capillary: 202 mg/dL — ABNORMAL HIGH (ref 70–99)
Glucose-Capillary: 193 mg/dL — ABNORMAL HIGH (ref 70–99)
Glucose-Capillary: 129 mg/dL — ABNORMAL HIGH (ref 70–99)
Glucose-Capillary: 157 mg/dL — ABNORMAL HIGH (ref 70–99)
Glucose-Capillary: 176 mg/dL — ABNORMAL HIGH (ref 70–99)

## 2024-09-04 LAB — CULTURE, RESPIRATORY W GRAM STAIN

## 2024-09-04 LAB — PATHOLOGIST SMEAR REVIEW

## 2024-09-04 LAB — BASIC METABOLIC PANEL WITH GFR
Anion gap: 12 (ref 5–15)
BUN: 34 mg/dL — ABNORMAL HIGH (ref 8–23)
CO2: 22 mmol/L (ref 22–32)
Calcium: 8.4 mg/dL — ABNORMAL LOW (ref 8.9–10.3)
Chloride: 108 mmol/L (ref 98–111)
Creatinine, Ser: 1.33 mg/dL — ABNORMAL HIGH (ref 0.44–1.00)
GFR, Estimated: 44 mL/min — ABNORMAL LOW (ref >60)
Glucose, Bld: 225 mg/dL — ABNORMAL HIGH (ref 70–99)
Potassium: 3.5 mmol/L (ref 3.5–5.1)
Sodium: 141 mmol/L (ref 135–145)

## 2024-09-04 LAB — MENINGITIS/ENCEPHALITIS PANEL (CSF)

## 2024-09-04 LAB — PROTEIN, CSF: Total  Protein, CSF: 275 mg/dL — ABNORMAL HIGH (ref 15–45)

## 2024-09-04 LAB — CRYPTOCOCCAL ANTIGEN, CSF: Crypto Ag: NEGATIVE

## 2024-09-04 LAB — MAGNESIUM: Magnesium: 2 mg/dL (ref 1.7–2.4)

## 2024-09-04 MED ORDER — B COMPLEX-C PO TABS
1.0000 | ORAL_TABLET | Freq: Every day | ORAL | Status: DC
  Administered 2024-09-05: 10:00:00 1
  Filled 2024-09-04: qty 1

## 2024-09-04 MED ORDER — JUVEN PO PACK
1.0000 | PACK | Freq: Two times a day (BID) | ORAL | Status: DC
  Administered 2024-09-05: 10:00:00 1

## 2024-09-04 MED ORDER — LIDOCAINE HCL (PF) 1 % IJ SOLN
10.0000 mL | Freq: Once | INTRAMUSCULAR | Status: AC
  Administered 2024-09-04: 13:00:00 10 mL via INTRADERMAL
  Filled 2024-09-04: qty 10

## 2024-09-04 MED ORDER — THIAMINE HCL 100 MG PO TABS
100.0000 mg | ORAL_TABLET | Freq: Every day | ORAL | Status: DC
  Administered 2024-09-05: 10:00:00 100 mg
  Filled 2024-09-04 (×2): qty 1

## 2024-09-04 MED ORDER — POLYETHYLENE GLYCOL 3350 17 G PO PACK: 17.0000 g | PACK | Freq: Every day | ORAL | Status: DC | PRN

## 2024-09-04 MED ORDER — VITAL HP 1.0 CAL PO LIQD
1000.0000 mL | ORAL | Status: DC
  Administered 2024-09-04: 19:00:00 1000 mL

## 2024-09-04 MED ORDER — LIDOCAINE 1 % OPTIME INJ - NO CHARGE
5.0000 mL | Freq: Once | INTRAMUSCULAR | Status: AC
  Administered 2024-09-04: 13:00:00 5 mL via INTRADERMAL
  Filled 2024-09-04: qty 6

## 2024-09-04 MED ORDER — DOCUSATE SODIUM 50 MG/5ML PO LIQD: 100.0000 mg | Freq: Two times a day (BID) | ORAL | Status: DC | PRN

## 2024-09-04 MED ORDER — FREE WATER
30.0000 mL | Status: DC
  Administered 2024-09-04 – 2024-09-05 (×4): 30 mL

## 2024-09-04 MED ORDER — PERFLUTREN LIPID MICROSPHERE
1.0000 mL | INTRAVENOUS | Status: AC | PRN
  Administered 2024-09-04: 15:00:00 2 mL via INTRAVENOUS

## 2024-09-04 NOTE — Progress Notes (Signed)
 OT Cancellation Note  Patient Details Name: Haley Deleon MRN: [id number] DOB: 21-Oct-1955   Cancelled Treatment:    Reason Eval/Treat Not Completed: Patient not medically ready. Chart reviewed - pt has transitioned to higher level of care, now intubated. Per therapy protocols, will require new orders or continue at transfer orders to initiate therapy services once pt appropriate for services. Will sign off, please reconsult as pt appropriate.  Elston Slot, M.S. OTR/L  09/04/2024, 8:16 AM  ascom [phone]

## 2024-09-04 NOTE — Progress Notes (Signed)
 This RT relieved the ICU RT in IR and pt transported from IR to CT for a CT guided LP procedure. PT then transported back to ICU 14 on servo air with no complications.

## 2024-09-04 NOTE — Progress Notes (Signed)
 INFECTIOUS DISEASE PROGRESS NOTE Date of Admission:  08/15/2024     ID: Haley Deleon is a 68 y.o. female with  fever, ams Principal Problem:   Acute metabolic encephalopathy Active Problems:   Ulcerative colitis (HCC)   Alcohol  withdrawal (HCC)   Chronic diastolic CHF (congestive heart failure) (HCC)   Intertriginous candidiasis   Depression   Pain and swelling of left lower leg   Obesity, Class III, BMI 40-49.9 (morbid obesity) (HCC)   UTI (urinary tract infection)   Aspiration pneumonia of both lungs due to gastric secretions (HCC)   Septic shock (HCC)   Acute respiratory failure with hypoxia (HCC)   AKI (acute kidney injury)   Subjective: Now in unit, intubated after resp compromise. BAL cx with pseudomonas and MRSA. On linezolid   and zosyn   Parainfluenza + as well   ROS  Unable to obtain   Medications:  Antibiotics Given (last 72 hours)     Date/Time Action Medication Dose Rate   09/01/24 1930 New Bag/Given   vancomycin  (VANCOREADY) IVPB 1500 mg/300 mL 1,500 mg 150 mL/hr   09/01/24 2138 New Bag/Given   vancomycin  (VANCOCIN ) IVPB 1000 mg/200 mL premix 1,000 mg 200 mL/hr   09/01/24 2150 New Bag/Given   piperacillin -tazobactam (ZOSYN ) IVPB 3.375 g 3.375 g 12.5 mL/hr   09/02/24 0414 New Bag/Given   piperacillin -tazobactam (ZOSYN ) IVPB 3.375 g 3.375 g 12.5 mL/hr   09/02/24 1150 New Bag/Given   linezolid  (ZYVOX ) IVPB 600 mg 600 mg 300 mL/hr   09/02/24 1330 New Bag/Given   piperacillin -tazobactam (ZOSYN ) IVPB 3.375 g 3.375 g 12.5 mL/hr   09/02/24 2031 New Bag/Given   piperacillin -tazobactam (ZOSYN ) IVPB 3.375 g 3.375 g 12.5 mL/hr   09/02/24 2126 New Bag/Given   linezolid  (ZYVOX ) IVPB 600 mg 600 mg 300 mL/hr   09/03/24 0450 New Bag/Given   piperacillin -tazobactam (ZOSYN ) IVPB 3.375 g 3.375 g 12.5 mL/hr   09/03/24 0944 New Bag/Given   linezolid  (ZYVOX ) IVPB 600 mg 600 mg 300 mL/hr   09/03/24 1147 New Bag/Given   piperacillin -tazobactam (ZOSYN ) IVPB 3.375 g 3.375 g  12.5 mL/hr   09/03/24 2143 New Bag/Given   linezolid  (ZYVOX ) IVPB 600 mg 600 mg 300 mL/hr   09/03/24 2145 New Bag/Given   piperacillin -tazobactam (ZOSYN ) IVPB 3.375 g 3.375 g 12.5 mL/hr   09/04/24 0419 New Bag/Given   piperacillin -tazobactam (ZOSYN ) IVPB 3.375 g 3.375 g 12.5 mL/hr   09/04/24 0935 New Bag/Given   linezolid  (ZYVOX ) IVPB 600 mg 600 mg 300 mL/hr   09/04/24 1145 New Bag/Given   piperacillin -tazobactam (ZOSYN ) IVPB 3.375 g 3.375 g 12.5 mL/hr       Chlorhexidine  Gluconate Cloth  6 each Topical Daily   escitalopram   10 mg Per Tube Daily   folic acid   1 mg Per Tube Daily   heparin  injection (subcutaneous)  5,000 Units Subcutaneous Q8H   hydrocortisone  sod succinate (SOLU-CORTEF ) inj  50 mg Intravenous Q6H   insulin  aspart  0-24 Units Subcutaneous Q4H   insulin  glargine  8 Units Subcutaneous Daily   multivitamin with minerals  1 tablet Per Tube Daily   mupirocin  ointment  1 Application Nasal BID   nystatin    Topical BID   mouth rinse  15 mL Mouth Rinse Q2H   pantoprazole  (PROTONIX ) IV  40 mg Intravenous Daily    Objective: Vital signs in last 24 hours: Temp:  [97.5 F (36.4 C)-100 F (37.8 C)] 97.9 F (36.6 C) (12/15 0800) Pulse Rate:  [55-96] 65 (12/15 1130) Resp:  [15-27] 18 (  12/15 1130) BP: (89-152)/(56-92) 117/83 (12/15 1130) SpO2:  [96 %-100 %] 99 % (12/15 1130) FiO2 (%):  [30 %] 30 % (12/15 0750) Weight:  [115.1 kg] 115.1 kg (12/15 0500) Constitutional:  intubated, sedated  HENT: ETT  Neck supple Cardiovascular: Normal rate, regular rhythm and normal heart sounds.  Pulmonary/Chest:  Poor air movement  Neck = diff to assess neck movemetn Abdominal: Soft. Bowel sounds are normal.  exhibits no distension.  Lymphadenopathy: no cervical adenopathy. No axillary adenopathy Neurological:  sedated Skin: some abrasions and bruising.   Lab Results Recent Labs    09/03/24 0429 09/04/24 0419  WBC 21.1* 15.6*  HGB 12.6 11.1*  HCT 38.4 34.4*  NA 141 141  K  4.0 3.5  CL 106 108  CO2 21* 22  BUN 38* 34*  CREATININE 1.65* 1.33*    Microbiology: Results for orders placed or performed during the hospital encounter of 08/15/24  Urine Culture (for pregnant, neutropenic or urologic patients or patients with an indwelling urinary catheter)     Status: Abnormal   Collection Time: 08/16/24  5:07 AM   Specimen: Urine, Clean Catch  Result Value Ref Range Status   Specimen Description   Final    URINE, CLEAN CATCH Performed at [facility], [address], [place], [place] [address]    Special Requests   Final    NONE Performed at [facility], [address] [place], [place] [address]    Culture (A)  Final    30,000 COLONIES/mL PROTEUS MIRABILIS >=100,000 COLONIES/mL ESCHERICHIA COLI    Report Status 08/20/2024 FINAL  Final   Organism ID, Bacteria PROTEUS MIRABILIS (A)  Final   Organism ID, Bacteria ESCHERICHIA COLI (A)  Final      Susceptibility   Escherichia coli - MIC*    AMPICILLIN >=32 RESISTANT Resistant     CEFAZOLIN  (URINE) Value in next row Sensitive      2 SENSITIVEThis is a modified FDA-approved test that has been validated and its performance characteristics determined by the reporting laboratory.  This laboratory is certified under the Clinical Laboratory Improvement Amendments CLIA as qualified to perform high complexity clinical laboratory testing.    CEFEPIME Value in next row Sensitive      2 SENSITIVEThis is a modified FDA-approved test that has been validated and its performance characteristics determined by the reporting laboratory.  This laboratory is certified under the Clinical Laboratory Improvement Amendments CLIA as qualified to perform high complexity clinical laboratory testing.    ERTAPENEM Value in next row Sensitive      2 SENSITIVEThis is a modified FDA-approved test that has been validated and its performance characteristics determined by the reporting laboratory.  This laboratory is certified  under the Clinical Laboratory Improvement Amendments CLIA as qualified to perform high complexity clinical laboratory testing.    CEFTRIAXONE  Value in next row Sensitive      2 SENSITIVEThis is a modified FDA-approved test that has been validated and its performance characteristics determined by the reporting laboratory.  This laboratory is certified under the Clinical Laboratory Improvement Amendments CLIA as qualified to perform high complexity clinical laboratory testing.    CIPROFLOXACIN Value in next row Sensitive      2 SENSITIVEThis is a modified FDA-approved test that has been validated and its performance characteristics determined by the reporting laboratory.  This laboratory is certified under the Clinical Laboratory Improvement Amendments CLIA as qualified to perform high complexity clinical laboratory testing.    GENTAMICIN Value in next  row Sensitive      2 SENSITIVEThis is a modified FDA-approved test that has been validated and its performance characteristics determined by the reporting laboratory.  This laboratory is certified under the Clinical Laboratory Improvement Amendments CLIA as qualified to perform high complexity clinical laboratory testing.    NITROFURANTOIN Value in next row Sensitive      2 SENSITIVEThis is a modified FDA-approved test that has been validated and its performance characteristics determined by the reporting laboratory.  This laboratory is certified under the Clinical Laboratory Improvement Amendments CLIA as qualified to perform high complexity clinical laboratory testing.    TRIMETH/SULFA Value in next row Sensitive      2 SENSITIVEThis is a modified FDA-approved test that has been validated and its performance characteristics determined by the reporting laboratory.  This laboratory is certified under the Clinical Laboratory Improvement Amendments CLIA as qualified to perform high complexity clinical laboratory testing.    AMPICILLIN/SULBACTAM Value in next  row Sensitive      2 SENSITIVEThis is a modified FDA-approved test that has been validated and its performance characteristics determined by the reporting laboratory.  This laboratory is certified under the Clinical Laboratory Improvement Amendments CLIA as qualified to perform high complexity clinical laboratory testing.    PIP/TAZO Value in next row Sensitive      <=4 SENSITIVEThis is a modified FDA-approved test that has been validated and its performance characteristics determined by the reporting laboratory.  This laboratory is certified under the Clinical Laboratory Improvement Amendments CLIA as qualified to perform high complexity clinical laboratory testing.    MEROPENEM Value in next row Sensitive      <=4 SENSITIVEThis is a modified FDA-approved test that has been validated and its performance characteristics determined by the reporting laboratory.  This laboratory is certified under the Clinical Laboratory Improvement Amendments CLIA as qualified to perform high complexity clinical laboratory testing.    * >=100,000 COLONIES/mL ESCHERICHIA COLI   Proteus mirabilis - MIC*    AMPICILLIN Value in next row Sensitive      <=4 SENSITIVEThis is a modified FDA-approved test that has been validated and its performance characteristics determined by the reporting laboratory.  This laboratory is certified under the Clinical Laboratory Improvement Amendments CLIA as qualified to perform high complexity clinical laboratory testing.    CEFAZOLIN  (URINE) Value in next row Sensitive      8 SENSITIVEThis is a modified FDA-approved test that has been validated and its performance characteristics determined by the reporting laboratory.  This laboratory is certified under the Clinical Laboratory Improvement Amendments CLIA as qualified to perform high complexity clinical laboratory testing.    CEFEPIME Value in next row Sensitive      8 SENSITIVEThis is a modified FDA-approved test that has been validated and  its performance characteristics determined by the reporting laboratory.  This laboratory is certified under the Clinical Laboratory Improvement Amendments CLIA as qualified to perform high complexity clinical laboratory testing.    ERTAPENEM Value in next row Sensitive      8 SENSITIVEThis is a modified FDA-approved test that has been validated and its performance characteristics determined by the reporting laboratory.  This laboratory is certified under the Clinical Laboratory Improvement Amendments CLIA as qualified to perform high complexity clinical laboratory testing.    CEFTRIAXONE  Value in next row Sensitive      8 SENSITIVEThis is a modified FDA-approved test that has been validated and its performance characteristics determined by the reporting laboratory.  This laboratory is certified under  the Clinical Laboratory Improvement Amendments CLIA as qualified to perform high complexity clinical laboratory testing.    CIPROFLOXACIN Value in next row Sensitive      8 SENSITIVEThis is a modified FDA-approved test that has been validated and its performance characteristics determined by the reporting laboratory.  This laboratory is certified under the Clinical Laboratory Improvement Amendments CLIA as qualified to perform high complexity clinical laboratory testing.    GENTAMICIN Value in next row Sensitive      8 SENSITIVEThis is a modified FDA-approved test that has been validated and its performance characteristics determined by the reporting laboratory.  This laboratory is certified under the Clinical Laboratory Improvement Amendments CLIA as qualified to perform high complexity clinical laboratory testing.    NITROFURANTOIN Value in next row Resistant      8 SENSITIVEThis is a modified FDA-approved test that has been validated and its performance characteristics determined by the reporting laboratory.  This laboratory is certified under the Clinical Laboratory Improvement Amendments CLIA as qualified  to perform high complexity clinical laboratory testing.    TRIMETH/SULFA Value in next row Sensitive      8 SENSITIVEThis is a modified FDA-approved test that has been validated and its performance characteristics determined by the reporting laboratory.  This laboratory is certified under the Clinical Laboratory Improvement Amendments CLIA as qualified to perform high complexity clinical laboratory testing.    AMPICILLIN/SULBACTAM Value in next row Sensitive      8 SENSITIVEThis is a modified FDA-approved test that has been validated and its performance characteristics determined by the reporting laboratory.  This laboratory is certified under the Clinical Laboratory Improvement Amendments CLIA as qualified to perform high complexity clinical laboratory testing.    PIP/TAZO Value in next row Sensitive      <=4 SENSITIVEThis is a modified FDA-approved test that has been validated and its performance characteristics determined by the reporting laboratory.  This laboratory is certified under the Clinical Laboratory Improvement Amendments CLIA as qualified to perform high complexity clinical laboratory testing.    MEROPENEM Value in next row Sensitive      <=4 SENSITIVEThis is a modified FDA-approved test that has been validated and its performance characteristics determined by the reporting laboratory.  This laboratory is certified under the Clinical Laboratory Improvement Amendments CLIA as qualified to perform high complexity clinical laboratory testing.    * 30,000 COLONIES/mL PROTEUS MIRABILIS  Resp panel by RT-PCR (RSV, Flu A&B, Covid) Anterior Nasal Swab     Status: None   Collection Time: 08/20/24  5:34 PM   Specimen: Anterior Nasal Swab  Result Value Ref Range Status   SARS Coronavirus 2 by RT PCR NEGATIVE NEGATIVE Final    Comment: (NOTE) SARS-CoV-2 target nucleic acids are NOT DETECTED.  The SARS-CoV-2 RNA is generally detectable in upper respiratory specimens during the acute phase of  infection. The lowest concentration of SARS-CoV-2 viral copies this assay can detect is 138 copies/mL. A negative result does not preclude SARS-Cov-2 infection and should not be used as the sole basis for treatment or other patient management decisions. A negative result may occur with  improper specimen collection/handling, submission of specimen other than nasopharyngeal swab, presence of viral mutation(s) within the areas targeted by this assay, and inadequate number of viral copies(<138 copies/mL). A negative result must be combined with clinical observations, patient history, and epidemiological information. The expected result is Negative.  Fact Sheet for Patients:  bloggercourse.com  Fact Sheet for Healthcare Providers:  seriousbroker.it  This test is no  t yet approved or cleared by the [place]  FDA and  has been authorized for detection and/or diagnosis of SARS-CoV-2 by FDA under an Emergency Use Authorization (EUA). This EUA will remain  in effect (meaning this test can be used) for the duration of the COVID-19 declaration under Section 564(b)(1) of the Act, 21 U.S.C.section 360bbb-3(b)(1), unless the authorization is terminated  or revoked sooner.       Influenza A by PCR NEGATIVE NEGATIVE Final   Influenza B by PCR NEGATIVE NEGATIVE Final    Comment: (NOTE) The Xpert Xpress SARS-CoV-2/FLU/RSV plus assay is intended as an aid in the diagnosis of influenza from Nasopharyngeal swab specimens and should not be used as a sole basis for treatment. Nasal washings and aspirates are unacceptable for Xpert Xpress SARS-CoV-2/FLU/RSV testing.  Fact Sheet for Patients: bloggercourse.com  Fact Sheet for Healthcare Providers: seriousbroker.it  This test is not yet approved or cleared by the [place]  FDA and has been authorized for detection and/or diagnosis of SARS-CoV-2  by FDA under an Emergency Use Authorization (EUA). This EUA will remain in effect (meaning this test can be used) for the duration of the COVID-19 declaration under Section 564(b)(1) of the Act, 21 U.S.C. section 360bbb-3(b)(1), unless the authorization is terminated or revoked.     Resp Syncytial Virus by PCR NEGATIVE NEGATIVE Final    Comment: (NOTE) Fact Sheet for Patients: bloggercourse.com  Fact Sheet for Healthcare Providers: seriousbroker.it  This test is not yet approved or cleared by the [place]  FDA and has been authorized for detection and/or diagnosis of SARS-CoV-2 by FDA under an Emergency Use Authorization (EUA). This EUA will remain in effect (meaning this test can be used) for the duration of the COVID-19 declaration under Section 564(b)(1) of the Act, 21 U.S.C. section 360bbb-3(b)(1), unless the authorization is terminated or revoked.  Performed at [facility], [address] [place], [place] [address]   Respiratory (~20 pathogens) panel by PCR     Status: Abnormal   Collection Time: 08/21/24  8:23 AM   Specimen: Nasopharyngeal Swab; Respiratory  Result Value Ref Range Status   Adenovirus NOT DETECTED NOT DETECTED Final   Coronavirus 229E NOT DETECTED NOT DETECTED Final    Comment: (NOTE) The Coronavirus on the Respiratory Panel, DOES NOT test for the novel  Coronavirus (2019 nCoV)    Coronavirus HKU1 NOT DETECTED NOT DETECTED Final   Coronavirus NL63 NOT DETECTED NOT DETECTED Final   Coronavirus OC43 NOT DETECTED NOT DETECTED Final   Metapneumovirus NOT DETECTED NOT DETECTED Final   Rhinovirus / Enterovirus NOT DETECTED NOT DETECTED Final   Influenza A NOT DETECTED NOT DETECTED Final   Influenza B NOT DETECTED NOT DETECTED Final   Parainfluenza Virus 1 DETECTED (A) NOT DETECTED Final   Parainfluenza Virus 2 NOT DETECTED NOT DETECTED Final   Parainfluenza Virus 3 NOT DETECTED NOT DETECTED  Final   Parainfluenza Virus 4 NOT DETECTED NOT DETECTED Final   Respiratory Syncytial Virus NOT DETECTED NOT DETECTED Final   Bordetella pertussis NOT DETECTED NOT DETECTED Final   Bordetella Parapertussis NOT DETECTED NOT DETECTED Final   Chlamydophila pneumoniae NOT DETECTED NOT DETECTED Final   Mycoplasma pneumoniae NOT DETECTED NOT DETECTED Final    Comment: Performed at [facility], [address], [place], [place] [address]  MRSA Next Gen by PCR, Nasal     Status: Abnormal   Collection Time: 08/21/24  8:23 AM   Specimen: Nasal Mucosa; Nasal Swab  Result Value Ref  Range Status   MRSA by PCR Next Gen DETECTED (A) NOT DETECTED Final    Comment: RESULT CALLED TO, READ BACK BY AND VERIFIED WITH: ALLIE T. RN [id number] 08/21/24 HNM (NOTE) The GeneXpert MRSA Assay (FDA approved for NASAL specimens only), is one component of a comprehensive MRSA colonization surveillance program. It is not intended to diagnose MRSA infection nor to guide or monitor treatment for MRSA infections. Test performance is not FDA approved in patients less than 25 years old. Performed at [facility], [address] [place], [place] [address]   Culture, blood (Routine X 2) w Reflex to ID Panel     Status: None   Collection Time: 08/28/24  1:40 PM   Specimen: BLOOD  Result Value Ref Range Status   Specimen Description BLOOD BLOOD RIGHT HAND  Final   Special Requests   Final    BOTTLES DRAWN AEROBIC AND ANAEROBIC Blood Culture adequate volume   Culture   Final    NO GROWTH 5 DAYS Performed at [facility], [address] [place], [place] [address]    Report Status 09/02/2024 FINAL  Final  Culture, blood (Routine X 2) w Reflex to ID Panel     Status: None   Collection Time: 08/28/24  1:40 PM   Specimen: BLOOD  Result Value Ref Range Status   Specimen Description BLOOD BLOOD LEFT HAND  Final   Special Requests   Final    BOTTLES DRAWN AEROBIC AND ANAEROBIC Blood Culture adequate  volume   Culture   Final    NO GROWTH 5 DAYS Performed at [facility], [address] [place], [place] [address]    Report Status 09/02/2024 FINAL  Final  Culture, blood (Routine X 2) w Reflex to ID Panel     Status: None (Preliminary result)   Collection Time: 08/31/24  1:28 PM   Specimen: BLOOD  Result Value Ref Range Status   Specimen Description BLOOD BLOOD LEFT HAND  Final   Special Requests   Final    BOTTLES DRAWN AEROBIC AND ANAEROBIC Blood Culture adequate volume   Culture   Final    NO GROWTH 4 DAYS Performed at [facility], [address] [place], [place] [address]    Report Status PENDING  Incomplete  Culture, blood (Routine X 2) w Reflex to ID Panel     Status: None (Preliminary result)   Collection Time: 08/31/24  1:28 PM   Specimen: BLOOD  Result Value Ref Range Status   Specimen Description BLOOD BLOOD RIGHT HAND  Final   Special Requests   Final    BOTTLES DRAWN AEROBIC AND ANAEROBIC Blood Culture adequate volume   Culture   Final    NO GROWTH 4 DAYS Performed at [facility], [address] [place], [place] [address]    Report Status PENDING  Incomplete  Culture, Respiratory w Gram Stain     Status: None   Collection Time: 09/01/24  4:49 PM   Specimen: Bronchoalveolar Lavage; Respiratory  Result Value Ref Range Status   Specimen Description   Final    BRONCHIAL ALVEOLAR LAVAGE Performed at [facility], [address] [place], [place] [address]    Special Requests   Final    NONE Performed at [facility], [address] [place], [place] [address]    Gram Stain   Final    FEW WBC PRESENT, PREDOMINANTLY PMN FEW GRAM POSITIVE COCCI Performed at [facility], [address] [place], [place] [address]    Culture   Final    ABUNDANT METHICILLIN RESISTANT STAPHYLOCOCCUS AUREUS FEW PSEUDOMONAS AERUGINOSA    Report Status 09/04/2024 FINAL  Final   Organism ID, Bacteria METHICILLIN RESISTANT  STAPHYLOCOCCUS AUREUS  Final   Organism ID, Bacteria PSEUDOMONAS AERUGINOSA  Final      Susceptibility   Methicillin resistant staphylococcus aureus - MIC*    CIPROFLOXACIN >=8 RESISTANT Resistant     ERYTHROMYCIN >=8 RESISTANT Resistant     GENTAMICIN <=0.5 SENSITIVE Sensitive     OXACILLIN >=4 RESISTANT Resistant     TETRACYCLINE <=1 SENSITIVE Sensitive     VANCOMYCIN  1 SENSITIVE Sensitive     TRIMETH/SULFA <=10 SENSITIVE Sensitive     CLINDAMYCIN <=0.25 SENSITIVE Sensitive     RIFAMPIN <=0.5 SENSITIVE Sensitive     Inducible Clindamycin NEGATIVE Sensitive     LINEZOLID  2 SENSITIVE Sensitive     * ABUNDANT METHICILLIN RESISTANT STAPHYLOCOCCUS AUREUS   Pseudomonas aeruginosa - MIC*    MEROPENEM <=0.25 SENSITIVE Sensitive     CIPROFLOXACIN 0.12 SENSITIVE Sensitive     IMIPENEM 2 SENSITIVE Sensitive     PIP/TAZO Value in next row Sensitive      8 SENSITIVEThis is a modified FDA-approved test that has been validated and its performance characteristics determined by the reporting laboratory.  This laboratory is certified under the Clinical Laboratory Improvement Amendments CLIA as qualified to perform high complexity clinical laboratory testing.    CEFEPIME Value in next row Sensitive      8 SENSITIVEThis is a modified FDA-approved test that has been validated and its performance characteristics determined by the reporting laboratory.  This laboratory is certified under the Clinical Laboratory Improvement Amendments CLIA as qualified to perform high complexity clinical laboratory testing.    CEFTAZIDIME/AVIBACTAM Value in next row Sensitive      8 SENSITIVEThis is a modified FDA-approved test that has been validated and its performance characteristics determined by the reporting laboratory.  This laboratory is certified under the Clinical Laboratory Improvement Amendments CLIA as qualified to perform high complexity clinical laboratory testing.    CEFTOLOZANE/TAZOBACTAM Value in next row  Sensitive      8 SENSITIVEThis is a modified FDA-approved test that has been validated and its performance characteristics determined by the reporting laboratory.  This laboratory is certified under the Clinical Laboratory Improvement Amendments CLIA as qualified to perform high complexity clinical laboratory testing.    TOBRAMYCIN Value in next row Sensitive      8 SENSITIVEThis is a modified FDA-approved test that has been validated and its performance characteristics determined by the reporting laboratory.  This laboratory is certified under the Clinical Laboratory Improvement Amendments CLIA as qualified to perform high complexity clinical laboratory testing.    CEFTAZIDIME Value in next row Sensitive      8 SENSITIVEThis is a modified FDA-approved test that has been validated and its performance characteristics determined by the reporting laboratory.  This laboratory is certified under the Clinical Laboratory Improvement Amendments CLIA as qualified to perform high complexity clinical laboratory testing.    * FEW PSEUDOMONAS AERUGINOSA  Urine Culture (for pregnant, neutropenic or urologic patients or patients with an indwelling urinary catheter)     Status: None   Collection Time: 09/02/24  2:43 AM   Specimen: Urine, Catheterized  Result Value Ref Range Status   Specimen Description   Final    URINE, CATHETERIZED Performed at [facility], [address] [place], [place] [address]  Special Requests   Final    NONE Performed at [facility], [address], [place], [place] [address]    Culture   Final    NO GROWTH Performed at [facility], [address] [place], [place] [address]    Report Status 09/03/2024 FINAL  Final  MRSA Next Gen by PCR, Nasal     Status: Abnormal   Collection Time: 09/02/24  2:43 AM   Specimen: Nasal Mucosa; Nasal Swab  Result Value Ref Range Status   MRSA by PCR Next Gen DETECTED (A) NOT DETECTED Corrected    Comment:  (NOTE) The GeneXpert MRSA Assay (FDA approved for NASAL specimens only), is one component of a comprehensive MRSA colonization surveillance program. It is not intended to diagnose MRSA infection nor to guide or monitor treatment for MRSA infections. Test performance is not FDA approved in patients less than 17 years old. Performed at [facility], [address] [place], [place] [address] CORRECTED ON 12/13 AT 0840: PREVIOUSLY REPORTED AS DETECTED CRITICAL RESULT CALLED TO, READ BACK BY AND VERIFIED WITH: FREDIA FURY RN AT 9160 09/02/24 RAM     Studies/Results: CT GUIDED NEEDLE PLACEMENT Result Date: 09/04/2024 EXAM: CT GUIDED LUMBAR PUNCTURE 09/04/2024 01:47:40 PM COMPARISON: CT chest/abdomen/pelvis 08/31/2024 CLINICAL HISTORY: Meningitis PROCEDURE: Informed consent was obtained after the risks and benefits of the procedure were discussed with the patient or their representative and all questions were answered fully. Universal protocol was observed and a standard timeout was performed. The patient was positioned prone and the back was prepped and draped in the normal sterile fashion. 1% lidocaine  was used for local anesthesia. The subarachnoid space was accessed with a 22-gauge 5 spinal needle at the L5-S1 level. Approximately 3 ml of mildly bloody CSF was aspirated and sent for analysis. The stylet was reinserted, spinal needle was removed and brief pressure was applied at the puncture site. There were no immediate complications and the patient tolerated the procedure well. IMPRESSION: 1. Successful CT-guided lumbar puncture at L5-S1 with 3 mL of mildly bloody CSF obtained. Electronically signed by: Ryan Chess MD 09/04/2024 02:10 PM EST RP Workstation: [id number]   US  ARTERIAL LOWER EXTREMITY DUPLEX BILATERAL Result Date: 09/03/2024 CLINICAL DATA:  68 year old female with history of mottled skin. EXAM: BILATERAL LOWER EXTREMITY ARTERIAL DUPLEX SCAN TECHNIQUE: Gray-scale  sonography as well as color Doppler and duplex ultrasound was performed to evaluate the arteries of both lower extremities including the common, superficial and profunda femoral arteries, popliteal artery and calf arteries. COMPARISON:  None Available. FINDINGS: Right Lower Extremity ABI: Not obtained. Inflow: Normal common femoral arterial waveforms and velocities. No evidence of inflow (aortoiliac) disease. Outflow: Normal profunda femoral, superficial femoral and popliteal arterial waveforms and velocities. No focal elevation of the PSV to suggest stenosis. Runoff: Normal posterior and anterior tibial arterial waveforms and velocities. Vessels are patent to the ankle. Left Lower Extremity ABI: Not obtained. Inflow: Normal common femoral arterial waveforms and velocities. No evidence of inflow (aortoiliac) disease. Outflow: Normal profunda femoral, superficial femoral and popliteal arterial waveforms and velocities. No focal elevation of the PSV to suggest stenosis. Runoff: Normal posterior and anterior tibial arterial waveforms and velocities. Vessels are patent to the ankle. IMPRESSION: No evidence of hemodynamically significant stenosis or occlusion in the bilateral lower extremities. Ester Sides, MD [facility] Electronically Signed   By: Ester Sides M.D.   On: 09/03/2024 11:07    Assessment/Plan: REBEKA KIMBLE is a 68 y.o.  female with AMS and fevers recurring after initial treatment for UTI and CAP. + parainfluenza on 12/1. Now intubated and in ICU   Recommendations Aspiration PNA - now intubated- MRSA and Pseudomonas on BAL Cont linezolid  and zosyn   LP pending  Thank you very much for the consult. Will follow with you.  Alm SHAUNNA Needle   09/04/2024, 2:24 PM

## 2024-09-04 NOTE — Plan of Care (Signed)
 Patient scheduled for IR LP this afternoon. D/w Dr. Isaiah, no need for neurology to see today, will f/u tomorrow and review LP results today when they are available.  Elida Ross, MD [facility] [phone]  If 7pm- 7am, please page neurology on call as listed in AMION.

## 2024-09-04 NOTE — Procedures (Signed)
 PROCEDURE SUMMARY:  Multiple fluoroscopic guided attempts were made at the levels of L4-5 and L3-4 for lumbar puncture by myself and Radiologist Dr. Ryan Chess.   Unfortunately, all attempts were unsuccessful.   After discussion with Dr. Chess and Dr. Isaiah, the decision was made to transfer the patient to CT for a CT Guided Needle Placement with Dr. Chess in an additional attempt to obtain fluid.   Please refer to his dictation of CT Guided Needle Placement for additional procedure details.  No immediate complications.  Pt tolerated well.   EBL = none  Please see full dictation in imaging section of Epic for procedure details.   Electronically Signed: Lashaun Krapf M Tiny Chaudhary, PA-C 09/04/2024, 5:23 PM

## 2024-09-04 NOTE — Progress Notes (Addendum)
 Palliative Care Progress Note, Assessment & Plan   Patient Name: Haley Deleon       Date: 09/04/2024 DOB: 11/11/1955  Age: 68 y.o. MRN#: [id number] Attending Physician: Isaiah Scrivener, MD Primary Care Physician: Ricard Tawni KIDD, MD Admit Date: 08/15/2024  Subjective: Patient is lying in bed, intubated and sedated, with mitts in place.  No family or friends present at bedside during my visit.  HPI: 68 y.o. female  with past medical history of alcohol  use disorder, CHF, CAD, depression and anxiety, morbid obesity, ulcerative colitis, in SVT, PSVT, varicose veins, carotid artery stenosis admitted from home on 08/15/2024 with left leg swelling and pain.  Soon after arrival to ED became altered and confused.   Hospital course has been complicated, has received treatment for left leg infection, UTI and parainfluenza virus.   Concern related to ongoing and worsening encephalopathy--neurology following, has received high-dose thiamine .  LP has been attempted unsuccessfully, plan to repeat on Monday.   Hospital course further complicated by rapid response being called 12/12 related to likely aspiration event resulting in transfer to ICU and need for endotracheal intubation.  Sedation has been turned off since 12/13 AM--opens eyes intermittently but unable to track, not able to follow commands.   Palliative medicine was consulted for assisting with goals of care conversations.  Summary of counseling/coordination of care: Extensive chart review completed prior to meeting patient including labs, vital signs, imaging, progress notes, orders, and available advanced directive documents from current and previous encounters.   After reviewing the patient's chart and assessing the patient at bedside, patient remains  unable to participate in goals of care or medical decision making independently at this time.  I counseled with RN at bedside.  Patient has just returned from LP.  Sedation was turned off earlier today and patient was able to move her foot.  However, sedation was restarted given patient's need for LP. Therefore, unable to complete PE at this time.   Unable to complete symptom assessment at this time.  No adjustment to [facility].  After visiting with the patient, I spoke with her granddaughter [name] over the phone.  Brief medical update given.  Discussed that patient's lumbar puncture was successful with CT guided ultrasound.  Results pending.  Space and opportunity provided for [name] to share her thoughts and emotions regarding her grandmother's current medical situation.  She shares she is grateful for the update.  She plans to be at the hospital tomorrow.  Advised her that I can come bedside to offer support and continue goals of care discussions at that time.  No adjustment to plan of care at this time.  PMT will continue to follow and support. Full code and full scope remain.    Physical Exam Vitals reviewed.  Constitutional:      General: She is not in acute distress. HENT:     Head: Normocephalic.     Mouth/Throat:     Mouth: Mucous membranes are moist.  Pulmonary:     Comments: Mechanical ventilatory support Abdominal:     Palpations: Abdomen is soft.  Skin:    General: Skin is warm and dry.  Neurological:     Comments: UTA - propofol  in  place             35 minute visit includes: Detailed review of medical records (labs, imaging, vital signs), medically appropriate exam (mental status, respiratory, cardiac, skin), discussed with treatment team, counseling and educating patient, family and staff, documenting clinical information, medication management and coordination of care.  Lamarr L. Arvid, DNP, FNP-BC Palliative Medicine Team

## 2024-09-04 NOTE — Progress Notes (Signed)
 PHARMACY CONSULT NOTE - FOLLOW UP  Pharmacy Consult for Electrolyte Monitoring and Replacement   Recent Labs: Potassium (mmol/L)  Date Value  09/04/2024 3.5  02/26/2014 4.1   Magnesium (mg/dL)  Date Value  87/84/7974 2.0   Calcium (mg/dL)  Date Value  87/84/7974 8.4 (L)   Calcium, Total (mg/dL)  Date Value  93/91/7984 9.0   Albumin (g/dL)  Date Value  87/86/7974 3.3 (L)  02/26/2014 2.7 (L)   Phosphorus (mg/dL)  Date Value  87/87/7974 4.4   Sodium (mmol/L)  Date Value  09/04/2024 141  02/26/2014 141     Assessment: 68 y.o. female admitted on 08/15/2024 with pneumonia. PMH significant for alcohol  use disorder, dCHF, CAD, depression, anxiety. Pharmacy is asked to follow and replace electrolytes while in CCU.  Goal of Therapy:  Electrolytes WNL  Plan:  ---no electrolyte replacement warranted for today ---recheck electrolytes in am  Adriana JONETTA Bolster ,PharmD Clinical Pharmacist 09/04/2024 7:11 AM

## 2024-09-04 NOTE — Progress Notes (Signed)
 NAME:  ALLANAH MCFARLAND, MRN:  [id number], DOB:  Jan 18, 1956, LOS: 20 ADMISSION DATE:  08/15/2024,  CHIEF COMPLAINT:  Encephalopathy, respiratory failure   History of Present Illness:  CONCETTA GUION is a 68 y.o. female with medical history significant of alcohol  use disorder, dCHF, CAD, depression with anxiety, morbid obesity, ulcerative colitis, NSVT, PSVT, varicose vein in both legs, carotid artery stenosis, morbid obesity, who presents with left leg swelling and pain, confusion.   Patient is currently altered, therefore history is obtained from chart review.   It is reported patient initially presented to ED with left leg swelling and pain, and then she developed confusion in ED. When I saw patient in ED, she is alert, orientated to place and person, not to the time. She moves all extremities normally. No facial droop or slurred speech. She has bilateral lower leg swelling, the left leg is much worse than the right. She does not have pain in the right leg. She denies fall or injury. No fever or chills. She has a varicose vein in both legs. Patient denies chest pain, cough, SOB. No nausea, vomiting, diarrhea or abdominal pain. No symptoms UTI. Patient has candidiasis in groin areas. Per her granddaughter at the bedside, patient used to drink liquor almost every day. Recently she drinks less, and last drinking was several days ago. Patient states that she only drinks once a week recently.   Pertinent  Medical History  -EtOH abuse -Carotid arterial disease -CAD -HFpEF -Ulcerative Colitis  Significant Hospital Events: Including procedures, antibiotic start and stop dates in addition to other pertinent events   11/25: admitted to hospitalist w/ acute metabolic encephalopathy EtOH withdrawal, UTI.  11/26: remains confused / hallucinating this morning. Continuing withdrawal protocols  11/27: remains confused but is more calm today. Urine culture still pending.  11/28: remains confused but is more  calm today and bit more conversant but not making sense. Urine culture still pending - staff has confirmed w/ lab that it is in process today as of 05:00 11/29: more alert but remains confused. PT/OT ordered for tomorrow. UCX pend susceptibilities  11/30: Proteus and Ecoli both sensitive to rocephin . Febrile this AM so will leave on abx, CXR read as possible R mid-lung atelectasis / early infection, also vascular congestion. Gave dose Lasix  and will add azithro for possible PNA 12/01: continues to be febrile, more oriented but still confused / not conversational so difficult history. No skin lesions. Added vancomycin  given (+)MRSA screen and skin erythema groin, but likely d/t parainfluenza virus 12/02: febrile but improved, remains somewhat confused, continue abx 12/9: ID consulted for AMS and recurrent fevers after initial treatment of UTI and CAP. 12/10: Neurology consulted for AMS. defervescing. Some improvement Mental status. UA not a clean catch but mixed. Some cough today. MRI head neg. Neck is supple so doubt meningitis and the waxing and waning mental status would be unusual for bacterial meningitis  12/11: Afebrile, more alert but remains confused.  Coughing on exam, Unsuccessful LP attempt.  12/12: Remains encephalopathic, high risk for aspiration.  PCCM consulted to evaluate ability to protect airway and possible need for intubation.  Palliative Care consulted. 12/12 INTUBATED 12/12 CVL placed 12/12 [facility] 12/13: ordered for BLE dopplers for mottling L>R, attempting to wean pressors (vaso/Levo). Started on IV hydrocortisone  q6h for septic shock. CXR with basilar atelectasis. Initiated enteral feeds via NGT. Urine culture pending.  12/14: encephalopathic, discontinued vasopressin   Interim History / Subjective:  Remains critically ill Remains intubated Severe hypoxia Requires  VENT support for survival Vent Mode: PRVC FiO2 (%):  [30 %] 30 % Set Rate:  [15 bmp] 15 bmp Vt Set:  [450 mL]  450 mL PEEP:  [5 cmH20] 5 cmH20 Plateau Pressure:  [15 cmH20] 15 cmH20   Objective    Blood pressure 112/71, pulse 74, temperature (!) 97.5 F (36.4 C), temperature source Axillary, resp. rate 20, height 5' 7 (1.702 m), weight 115.1 kg, SpO2 100%.    Vent Mode: PRVC FiO2 (%):  [30 %] 30 % Set Rate:  [15 bmp] 15 bmp Vt Set:  [450 mL] 450 mL PEEP:  [5 cmH20] 5 cmH20 Plateau Pressure:  [15 cmH20] 15 cmH20   Intake/Output Summary (Last 24 hours) at 09/04/2024 0743 Last data filed at 09/04/2024 9351 Gross per 24 hour  Intake 1928.68 ml  Output 880 ml  Net 1048.68 ml   Filed Weights   09/02/24 0500 09/03/24 0447 09/04/24 0500  Weight: 116.9 kg 117.1 kg 115.1 kg     REVIEW OF SYSTEMS  PATIENT IS UNABLE TO PROVIDE COMPLETE REVIEW OF SYSTEMS DUE TO SEVERE CRITICAL ILLNESS   PHYSICAL EXAMINATION:  GENERAL:critically ill appearing, +resp distress EYES: Pupils equal, round, reactive to light.  No scleral icterus.  MOUTH: Moist mucosal membrane. INTUBATED NECK: Supple.  PULMONARY: Lungs clear to auscultation, +rhonchi, +wheezing CARDIOVASCULAR: S1 and S2.  Regular rate and rhythm GASTROINTESTINAL: Soft, nontender, -distended. Positive bowel sounds.  MUSCULOSKELETAL: No swelling, clubbing, or edema.  NEUROLOGIC: obtunded,sedated SKIN:normal, warm to touch, Capillary refill delayed  Pulses present bilaterally    Assessment and Plan   68 year old female with history of chronic alcohol  use who presented to the hospital with lower extremity pain, found to be encephalopathic for which she was admitted. She developed aspiration pneumonia resulting in respiratory failure requiring intubation and admission to the ICU.  Severe ACUTE Hypoxic and Hypercapnic Respiratory Failure -continue Mechanical Ventilator support -Wean Fio2 and PEEP as tolerated -VAP/VENT bundle implementation - Wean PEEP & FiO2 as tolerated, maintain SpO2 > 88% - Head of bed elevated 30 degrees, VAP protocol  in place - Plateau pressures less than 30 cm H20  - Intermittent chest x-ray & ABG PRN - Ensure adequate pulmonary hygiene     NEUROLOGY ACUTE METABOLIC ENCEPHALOPATHY -need for sedation -Goal RASS -2 to -3 Follow up NEURO RECS Consider infectious etiology history of chronic alcohol  use and developed alcohol  withdrawal (required phenobarbital  11/25 and lorazepam  11/26), remains encephalopathic with attempted LP unsuccessful by IR (would recommend re-attempting on Monday) as well as EEG only showing slowing/encephalopathy and unremarkable imaging. Would recommend sending infectious/auto-immune/paraneoplastic panels on LP fluid once obtained. Continue high dose thiamine  and folate.   ACUTE KIDNEY INJURY/Renal Failure -continue Foley Catheter-assess need -Avoid nephrotoxic agents -Follow urine output, BMP -Ensure adequate renal perfusion, optimize oxygenation -Renal dose medications   Intake/Output Summary (Last 24 hours) at 09/04/2024 0747 Last data filed at 09/04/2024 9351 Gross per 24 hour  Intake 1928.68 ml  Output 880 ml  Net 1048.68 ml   INFECTIOUS DISEASE -continue antibiotics as prescribed -follow up cultures -follow up ID consultation on broad spectrum antibiotics for management of aspiration pneumonia with pseudomonal and MRSA coverage (Zosyn /Linezolid ). Respiratory cultures sent from bronchial washings. Would recommend reattempted LP with spinal fluid sent to assess for infectious, autoimmune, and paraneoplastic causes of encephalitis (send out to [facility]).    SEPTIC shock SOURCE-parainfluenza pneumonia, encephalitis? -use vasopressors to keep MAP>65 as needed -follow ABG and LA as needed -follow up cultures -emperic ABX -  stress dose steroids      ENDO - ICU hypoglycemic\Hyperglycemia protocol -check FSBS per protocol   GI GI PROPHYLAXIS as indicated NUTRITIONAL STATUS DIET-->TF's as tolerated Constipation protocol as  indicated   ELECTROLYTES -follow labs as needed -replace as needed -pharmacy consultation and following  RESTRICTIVE TRANSFUSION PROTOCOL TRANSFUSION  IF HGB<7  or ACTIVE BLEEDING OR DX of ACUTE CORONARY SYNDROMES      Labs   CBC: Recent Labs  Lab 09/01/24 0522 09/01/24 1838 09/02/24 0425 09/03/24 0429 09/04/24 0419  WBC 13.6* 22.8* 34.2* 21.1* 15.6*  HGB 14.9 14.7 14.4 12.6 11.1*  HCT 45.7 45.6 45.6 38.4 34.4*  MCV 92.3 92.7 94.0 91.4 92.2  PLT 230 240 229 187 183    Basic Metabolic Panel: Recent Labs  Lab 09/01/24 0522 09/01/24 1838 09/02/24 0425 09/03/24 0429 09/04/24 0419  NA 141 142 138 141 141  K 3.8 3.7 3.5 4.0 3.5  CL 107 105 104 106 108  CO2 21* 21* 19* 21* 22  GLUCOSE 188* 213* 249* 210* 225*  BUN 28* 30* 33* 38* 34*  CREATININE 1.01* 1.20* 1.86* 1.65* 1.33*  CALCIUM 9.5 9.2 9.3 8.9 8.4*  MG  --   --   --   --  2.0  PHOS  --  4.4  --   --   --    GFR: Estimated Creatinine Clearance: 53.8 mL/min (A) (by C-G formula based on SCr of 1.33 mg/dL (H)). Recent Labs  Lab 08/28/24 1340 08/29/24 0434 09/01/24 1838 09/01/24 2114 09/02/24 0425 09/02/24 1336 09/03/24 0429 09/04/24 0419  PROCALCITON 0.10  --   --   --   --   --   --   --   WBC  --    < > 22.8*  --  34.2*  --  21.1* 15.6*  LATICACIDVEN  --   --  1.4 4.4*  --  1.9  --   --    < > = values in this interval not displayed.    Liver Function Tests: Recent Labs  Lab 08/29/24 0434 08/30/24 1045 09/01/24 0522 09/01/24 1838 09/02/24 0425  AST 71* 64* 39  --  61*  ALT 71* 73* 53*  --  89*  ALKPHOS 109 112 115  --  146*  BILITOT 0.7 1.0 1.0  --  0.9  PROT 7.1 7.5 7.6  --  7.1  ALBUMIN 3.5 3.5 3.6 3.5 3.3*   No results for input(s): LIPASE, AMYLASE in the last 168 hours. Recent Labs  Lab 08/29/24 0434  AMMONIA 25    ABG    Component Value Date/Time   PHART 7.34 (L) 09/02/2024 0457   PCO2ART 34 09/02/2024 0457   PO2ART 73 (L) 09/02/2024 0457   HCO3 18.3 (L) 09/02/2024  0457   ACIDBASEDEF 6.6 (H) 09/02/2024 0457   O2SAT 97.8 09/02/2024 0457      DVT/GI PRX  assessed I Assessed the need for Labs I Assessed the need for Foley I Assessed the need for Central Venous Line Family Discussion when available I Assessed the need for Mobilization I made an Assessment of medications to be adjusted accordingly Safety Risk assessment completed  CASE DISCUSSED IN MULTIDISCIPLINARY ROUNDS WITH ICU TEAM     Critical Care Time devoted to patient care services described in this note is 55 minutes.  Critical care was necessary to treat /prevent imminent and life-threatening deterioration. Overall, patient is critically ill, prognosis is guarded.  Patient with Multiorgan failure and at high risk for cardiac  arrest and death.    Nickolas Alm Cellar, M.D.  [facility]  Medical Director [facility]

## 2024-09-04 NOTE — Plan of Care (Signed)
°  Problem: Clinical Measurements: Goal: Ability to maintain clinical measurements within normal limits will improve Outcome: Progressing Goal: Will remain free from infection Outcome: Progressing Goal: Diagnostic test results will improve Outcome: Progressing Goal: Respiratory complications will improve Outcome: Progressing Goal: Cardiovascular complication will be avoided Outcome: Progressing   Problem: Respiratory: Goal: Ability to maintain a clear airway and adequate ventilation will improve Outcome: Progressing

## 2024-09-04 NOTE — Inpatient Diabetes Management (Signed)
 Inpatient Diabetes Program Recommendations  AACE/ADA: New Consensus Statement on Inpatient Glycemic Control   Target Ranges:  Prepandial:   less than 140 mg/dL      Peak postprandial:   less than 180 mg/dL (1-2 hours)      Critically ill patients:  140 - 180 mg/dL    Latest Reference Range & Units 09/03/24 07:37 09/03/24 11:17 09/03/24 15:25 09/03/24 19:42 09/03/24 23:18 09/04/24 03:40 09/04/24 07:46 09/04/24 10:51  Glucose-Capillary 70 - 99 mg/dL 801 (H) 772 (H) 805 (H) 246 (H) 266 (H) 221 (H) 202 (H) 193 (H)   Review of Glycemic Control  Diabetes history: NO Outpatient Diabetes medications: NA Current orders for Inpatient glycemic control: Lantus  8 units daily, Novolog  0-24 units Q4H; Solucortef 50 mg Q6H, Osmolite @ 60 ml/hr  Inpatient Diabetes Program Recommendations:    Insulin : If steroids and tube feedings are continued as ordered, please consider increasing Lantus  to 13 units daily and adding Novolog  4 units Q4H for tube feeding coverage. If tube feeding is stopped or held then Novolog  tube feeding coverage should also be stopped or held.   Thanks, Earnie Gainer, RN, MSN, CDCES Diabetes Coordinator Inpatient Diabetes Program [phone] (Team Pager from 8am to 5pm)

## 2024-09-04 NOTE — Progress Notes (Signed)
 Nutrition Follow-up  DOCUMENTATION CODES:   Obesity unspecified  INTERVENTION:   Vital HP @60ml /hr continuous   Free water  flushes 30ml q4 hours to maintain tube patency   Propofol : 26.8 ml/hr- provides 707kcal/day   Regimen provides 2147kcal/day, 126g/day protein and 1324ml/day of free water   Pt at high refeed risk; recommend monitor potassium, magnesium and phosphorus labs daily until stable  Juven Fruit Punch BID via tube, each serving provides 95kcal and 2.5g of protein (amino acids glutamine and arginine)  B-complex with C daily via tube   Thiamine  100mg  daily via tube x 7 days   Daily weights   NUTRITION DIAGNOSIS:   Inadequate oral intake related to inability to eat, dysphagia as evidenced by NPO status. 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  Pt sedated and ventilated. OGT in place. Pt has been tolerating Osmolite 1.2@60ml /hr. 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 RD will change pt to a semi-elemental formula while on the ventilator. Pt is at high refeed risk. RD will add vitamins to support wound healing. Per chart, pt is down 14lbs(5%) since  admission and from her UBW; this is significant weight loss.   Medications reviewed and include: folic acid , heparin , solu-cortef , insulin , MVI, protonix , linezolid , zosyn , propofol   Labs reviewed: K 3.5 wnl, BUN 34(H), creat 1.33(H), Mg 2.0 wnl B12- 377 wnl, folate >20.0 wnl- 12/10 Wbc- 15.6(H) Cbgs- 129, 193, 202 x 24 hrs   Patient is currently intubated on ventilator support MV: 12.2 L/min Temp (24hrs), Avg:98.7 F (37.1 C), Min:97.5 F (36.4 C), Max:100 F (37.8 C)  Propofol : 26.8 ml/hr- provides 707kcal/day   MAP >41mmHg   UOP-   NUTRITION - FOCUSED PHYSICAL EXAM:  Flowsheet Row Most Recent Value  Orbital Region No depletion  Upper Arm Region No depletion  Thoracic and Lumbar Region No depletion  Buccal Region Mild depletion  Temple Region Moderate depletion  Clavicle Bone Region Moderate depletion  Clavicle and Acromion Bone Region Moderate depletion  Scapular Bone Region No depletion  Dorsal Hand Unable to assess  Patellar Region No depletion  Anterior Thigh Region No depletion  Posterior Calf Region No depletion  Edema (RD Assessment) Moderate  [LLE, BUE]  Hair Reviewed  Eyes Reviewed  Mouth Reviewed  Skin Reviewed  [ecchymosis]  Nails Reviewed   Diet Order:   Diet Order             Diet NPO time specified  Diet effective now                  EDUCATION NEEDS:   Not appropriate for education at this time  Skin:  Skin Assessment: Reviewed RN Assessment (Stage II buttocks, candidiasis groin, L leg swelling)  Last BM:  12/15- type  6  Height:   Ht Readings from Last 1 Encounters:  09/01/24 5' 7 (1.702 m)    Weight:   Wt Readings from Last 1 Encounters:  09/04/24 115.1 kg    Ideal Body Weight:  59.1 kg  BMI:  Body mass index is 39.74 kg/m.  Estimated Nutritional Needs:   Kcal:  2128kcal/day  Protein:  >125g/day  Fluid:  1.7-1.9L/day  Augustin Shams MS, RD, LDN If unable to be reached, please send secure chat to RD  inpatient available from 8:00a-4:00p daily

## 2024-09-04 NOTE — Plan of Care (Signed)

## 2024-09-04 NOTE — Progress Notes (Signed)
 Dr. Isaiah aware of elevated CSF WBC.

## 2024-09-05 DIAGNOSIS — J690 Pneumonitis due to inhalation of food and vomit: Secondary | ICD-10-CM

## 2024-09-05 LAB — CULTURE, BLOOD (ROUTINE X 2)
Culture: NO GROWTH
Culture: NO GROWTH
Special Requests: ADEQUATE
Special Requests: ADEQUATE

## 2024-09-05 LAB — CBC
HCT: 34.4 % — ABNORMAL LOW (ref 36.0–46.0)
Hemoglobin: 11.3 g/dL — ABNORMAL LOW (ref 12.0–15.0)
MCH: 29.7 pg (ref 26.0–34.0)
MCHC: 32.8 g/dL (ref 30.0–36.0)
MCV: 90.3 fL (ref 80.0–100.0)
Platelets: 191 K/uL (ref 150–400)
RBC: 3.81 MIL/uL — ABNORMAL LOW (ref 3.87–5.11)
RDW: 13.5 % (ref 11.5–15.5)
WBC: 13.2 K/uL — ABNORMAL HIGH (ref 4.0–10.5)
nRBC: 0 % (ref 0.0–0.2)

## 2024-09-05 LAB — BASIC METABOLIC PANEL WITH GFR
Anion gap: 11 (ref 5–15)
BUN: 39 mg/dL — ABNORMAL HIGH (ref 8–23)
CO2: 24 mmol/L (ref 22–32)
Calcium: 8.7 mg/dL — ABNORMAL LOW (ref 8.9–10.3)
Chloride: 106 mmol/L (ref 98–111)
Creatinine, Ser: 1.11 mg/dL — ABNORMAL HIGH (ref 0.44–1.00)
GFR, Estimated: 54 mL/min — ABNORMAL LOW (ref >60)
Glucose, Bld: 208 mg/dL — ABNORMAL HIGH (ref 70–99)
Potassium: 3.7 mmol/L (ref 3.5–5.1)
Sodium: 141 mmol/L (ref 135–145)

## 2024-09-05 LAB — GLUCOSE, CAPILLARY
Glucose-Capillary: 195 mg/dL — ABNORMAL HIGH (ref 70–99)
Glucose-Capillary: 167 mg/dL — ABNORMAL HIGH (ref 70–99)
Glucose-Capillary: 195 mg/dL — ABNORMAL HIGH (ref 70–99)
Glucose-Capillary: 180 mg/dL — ABNORMAL HIGH (ref 70–99)

## 2024-09-05 LAB — TRIGLYCERIDES: Triglycerides: 119 mg/dL (ref <150)

## 2024-09-05 LAB — MAGNESIUM: Magnesium: 2.1 mg/dL (ref 1.7–2.4)

## 2024-09-05 LAB — PHOSPHORUS: Phosphorus: 2.6 mg/dL (ref 2.5–4.6)

## 2024-09-05 MED ORDER — MORPHINE 100MG IN NS 100ML (1MG/ML) PREMIX INFUSION
0.0000 mg/h | INTRAVENOUS | Status: AC
  Administered 2024-09-05 – 2024-09-06 (×2): 5 mg/h via INTRAVENOUS
  Filled 2024-09-05 (×2): qty 100

## 2024-09-05 MED ORDER — ACETAMINOPHEN 325 MG PO TABS: 650.0000 mg | ORAL_TABLET | Freq: Four times a day (QID) | ORAL | Status: DC | PRN

## 2024-09-05 MED ORDER — MORPHINE BOLUS VIA INFUSION: 5.0000 mg | INTRAVENOUS | Status: DC | PRN

## 2024-09-05 MED ORDER — GLYCOPYRROLATE 0.2 MG/ML IJ SOLN
0.2000 mg | INTRAMUSCULAR | Status: DC | PRN
  Filled 2024-09-05: qty 1

## 2024-09-05 MED ORDER — MIDAZOLAM HCL (PF) 2 MG/2ML IJ SOLN
2.0000 mg | INTRAMUSCULAR | Status: DC | PRN
  Administered 2024-09-05: 19:00:00 2 mg via INTRAVENOUS
  Filled 2024-09-05: qty 2

## 2024-09-05 MED ORDER — ORAL CARE MOUTH RINSE: 15.0000 mL | OROMUCOSAL | Status: DC | PRN

## 2024-09-05 MED ORDER — GLYCOPYRROLATE 1 MG PO TABS: 1.0000 mg | ORAL_TABLET | ORAL | Status: DC | PRN

## 2024-09-05 MED ORDER — ACETAMINOPHEN 650 MG RE SUPP: 650.0000 mg | Freq: Four times a day (QID) | RECTAL | Status: DC | PRN

## 2024-09-05 MED ORDER — GLYCOPYRROLATE 0.2 MG/ML IJ SOLN
0.2000 mg | INTRAMUSCULAR | Status: DC | PRN
  Administered 2024-09-05 – 2024-09-06 (×3): 0.2 mg via INTRAVENOUS
  Filled 2024-09-05 (×2): qty 1

## 2024-09-05 MED ORDER — LINEZOLID 600 MG/300ML IV SOLN
600.0000 mg | Freq: Two times a day (BID) | INTRAVENOUS | Status: DC
  Administered 2024-09-05: 12:00:00 600 mg via INTRAVENOUS
  Filled 2024-09-05 (×2): qty 300

## 2024-09-05 MED ORDER — SODIUM CHLORIDE 0.9 % IV SOLN: INTRAVENOUS | Status: AC

## 2024-09-05 MED ORDER — POLYVINYL ALCOHOL 1.4 % OP SOLN: 1.0000 [drp] | Freq: Four times a day (QID) | OPHTHALMIC | Status: DC | PRN

## 2024-09-05 NOTE — Progress Notes (Signed)
 PT Cancellation Note  Patient Details Name: MCKENZE SLONE MRN: [id number] DOB: Apr 13, 1956   Cancelled Treatment:    Reason Eval/Treat Not Completed: Medical issues which prohibited therapy (Not appropriate for PT at this time. Will sign off.)  Randine Essex, PT, MPT  Randine LULLA Essex 09/05/2024, 1:59 PM

## 2024-09-05 NOTE — IPAL (Signed)
°  Interdisciplinary Goals of Care Family Meeting   Date carried out: 09/05/2024  Location of the meeting: Bedside  Member's involved: Physician, Bedside Registered Nurse, and Family Member or next of kin   GOALS OF CARE DISCUSSION  The Clinical status was relayed to family in detail-Grand-Daughter  Updated and notified of patients medical condition- Patient remains unresponsive and will not open eyes to command.   Patient with increased WOB and using accessory muscles to breathe Explained to family course of therapy and the modalities  Patient with Progressive multiorgan failure with a very high probablity of a very minimal chance of meaningful recovery despite all aggressive and optimal medical therapy.   Family understands the situation.  They have consented and agreed to proceed with Comfort care measures.  Family are satisfied with Plan of action and management. All questions answered  Additional CC time 45 mins   Avaline Stillson Alm Cellar, M.D.  [facility]  Medical Director [facility] Medical Director [facility] Cardio-Pulmonary Department

## 2024-09-05 NOTE — IPAL (Signed)
°  Interdisciplinary Goals of Care Family Meeting   Date carried out: 09/05/2024  Location of the meeting: Bedside  Member's involved: Physician, Bedside Registered Nurse, Family Member or next of kin, and Palliative care team member    GOALS OF CARE DISCUSSION  The Clinical status was relayed to family in detail-Grand-daughter  Updated and notified of patients medical condition- Patient remains unresponsive and will not open eyes to command.   Patient with increased WOB and using accessory muscles to breathe Explained to family course of therapy and the modalities  Patient with Progressive multiorgan failure with a very high probablity of a very minimal chance of meaningful recovery despite all aggressive and optimal medical therapy.   Grand-Daughter understands the situation. Patient with significant ETOH abuse leading to demise Patient with severe resp failure, failure to wean from vent  Grand-daughter consented and agreed to DNR status, proceed with Comfort care measures in next 24-48 hrs  Family are satisfied with Plan of action and management. All questions answered  Additional CC time 45 mins   Haley Deleon, M.D.  [facility]  Medical Director [facility] Medical Director [facility] Cardio-Pulmonary Department

## 2024-09-05 NOTE — Plan of Care (Signed)

## 2024-09-05 NOTE — Progress Notes (Signed)
 Pt extubated at 1956

## 2024-09-05 NOTE — Progress Notes (Signed)
 PHARMACY CONSULT NOTE - FOLLOW UP  Pharmacy Consult for Electrolyte Monitoring and Replacement   Recent Labs: Potassium (mmol/L)  Date Value  09/05/2024 3.7  02/26/2014 4.1   Magnesium (mg/dL)  Date Value  87/83/7974 2.1   Calcium (mg/dL)  Date Value  87/83/7974 8.7 (L)   Calcium, Total (mg/dL)  Date Value  93/91/7984 9.0   Albumin (g/dL)  Date Value  87/86/7974 3.3 (L)  02/26/2014 2.7 (L)   Phosphorus (mg/dL)  Date Value  87/83/7974 2.6   Sodium (mmol/L)  Date Value  09/05/2024 141  02/26/2014 141     Assessment: 68 y.o. female admitted on 08/15/2024 with pneumonia. PMH significant for alcohol  use disorder, dCHF, CAD, depression, anxiety. Pharmacy is asked to follow and replace electrolytes while in CCU.  Goal of Therapy:  Electrolytes WNL  Plan:  ---no electrolyte replacement warranted for today ---recheck electrolytes in am  Adriana JONETTA Bolster ,PharmD Clinical Pharmacist 09/05/2024 7:10 AM

## 2024-09-05 NOTE — Plan of Care (Addendum)
 Neurology plan of care  D/w Dr. Isaiah, patient will likely be transitioning to comfort care in 36-48 hrs and no further assistance from neurology is requested at this time. We will sign off but please reach out if needed.  Elida Ross, MD [facility] [phone]  If 7pm- 7am, please page neurology on call as listed in AMION.

## 2024-09-05 NOTE — Progress Notes (Signed)
 Palliative Care Progress Note, Assessment & Plan   Patient Name: Haley Deleon       Date: 09/05/2024 DOB: 09-Jun-1956  Age: 68 y.o. MRN#: [id number] Attending Physician: Haley Scrivener, MD Primary Care Physician: Haley Tawni KIDD, MD Admit Date: 08/15/2024  Subjective: Patient is lying in bed, intubated and sedated.  Her granddaughter/decision-maker Haley Deleon is present at bedside during my visit.  HPI: 68 y.o. female  with past medical history of alcohol  use disorder, CHF, CAD, depression and anxiety, morbid obesity, ulcerative colitis, in SVT, PSVT, varicose veins, carotid artery stenosis admitted from home on 08/15/2024 with left leg swelling and pain.  Soon after arrival to ED became altered and confused.   Hospital course has been complicated, has received treatment for left leg infection, UTI and parainfluenza virus.   Concern related to ongoing and worsening encephalopathy--neurology following, has received high-dose thiamine .  LP has been attempted unsuccessfully, plan to repeat on Monday.   Hospital course further complicated by rapid response being called 12/12 related to likely aspiration event resulting in transfer to ICU and need for endotracheal intubation.  Sedation has been turned off since 12/13 AM--opens eyes intermittently but unable to track, not able to follow commands.   Palliative medicine was consulted for assisting with goals of care conversations.    Summary of counseling/coordination of care: Extensive chart review completed prior to meeting patient including labs, vital signs, imaging, progress notes, orders, and available advanced directive documents from current and previous encounters.   After reviewing the patient's chart and assessing the patient at bedside, I spoke with  patient's granddaughter Haley Deleon alongside Dr. Isaiah in regards to symptom management and goals of care.   Medical update of patient's condition conveyed to granddaughter.  Discussed that patient is off of sedation but having minimal signs of responsiveness, opening eyes to command, or able to wake up.  Additionally, discussed that patient is having increased work of breathing with increased secretions.  During our discussion, patient had a gag reflex and wherein she vomited tube feeds and required extensive suctioning.  [name] granddaughter was present throughout these episodes.  I attempted to elicit values and goals important to patient and her granddaughter.  Hely shares that she believes that patient is suffering and she does not like it.  She does not believe that her grandmother would want to live this way.  In light of these comments, we discussed shifting to comfort focused care.  Discussed one-way extubation, supporting patient with aggressive symptom management, and allowing the natural disease process to occur after liberating her from machines.  Space and opportunity provided for Haley Deleon to share her thoughts and emotions regarding her grandmother's current medical situation.  She shares that she would like to speak with her aunt/patient's sister before making any final decisions.  I returned to bedside a few hours later to meet with Haley Deleon again.  She is spoken with her aunt who plans to visit today.  She shares that she will shift to comfort measures once her aunt has arrived.  She is unsure if this will be this evening or tomorrow.  Endorse that there is no rush or urgency to the matter.  Patient will be maintained on  current regimen with no change to plan of care until Haley Deleon is prepared to shift to comfort one-way extubation.  She endorsed understanding and appreciation.  PMT will continue to follow and support.  One-way comfort extubation to occur either this evening or tomorrow when  Haley Deleon is ready.  Physical Exam Vitals reviewed.  Constitutional:      General: She is in acute distress.     Appearance: She is obese. She is ill-appearing.  HENT:     Head: Normocephalic.     Mouth/Throat:     Mouth: Mucous membranes are moist.  Cardiovascular:     Rate and Rhythm: Bradycardia present.  Pulmonary:     Comments: Mechnical ventilatory support Abdominal:     Palpations: Abdomen is soft.  Skin:    General: Skin is warm.  Neurological:     Comments: UTA               Recommendations:   DNR with limited interventions remains Plan for comfort care/one-way extubation either this evening or tomorrow-at granddaughter's discretion  50 minute visit includes: Detailed review of medical records (labs, imaging, vital signs), medically appropriate exam (mental status, respiratory, cardiac, skin), discussed with treatment team, counseling and educating patient, family and staff, documenting clinical information, medication management and coordination of care.  Haley L. Arvid, DNP, FNP-BC Palliative Medicine Team

## 2024-09-05 NOTE — Plan of Care (Signed)
  Problem: Health Behavior/Discharge Planning: Goal: Ability to manage health-related needs will improve Outcome: Progressing   Problem: Clinical Measurements: Goal: Ability to maintain clinical measurements within normal limits will improve Outcome: Progressing Goal: Will remain free from infection Outcome: Progressing Goal: Diagnostic test results will improve Outcome: Progressing Goal: Cardiovascular complication will be avoided Outcome: Progressing   Problem: Nutrition: Goal: Adequate nutrition will be maintained Outcome: Progressing

## 2024-09-05 NOTE — Progress Notes (Signed)
 NAME:  Haley Deleon, MRN:  [id number], DOB:  03-08-1956, LOS: 21 ADMISSION DATE:  08/15/2024,  CHIEF COMPLAINT:  Encephalopathy, respiratory failure   History of Present Illness:  Haley Deleon is a 68 y.o. female with medical history significant of alcohol  use disorder, dCHF, CAD, depression with anxiety, morbid obesity, ulcerative colitis, NSVT, PSVT, varicose vein in both legs, carotid artery stenosis, morbid obesity, who presents with left leg swelling and pain, confusion.   Patient is currently altered, therefore history is obtained from chart review.   It is reported patient initially presented to ED with left leg swelling and pain, and then she developed confusion in ED. When I saw patient in ED, she is alert, orientated to place and person, not to the time. She moves all extremities normally. No facial droop or slurred speech. She has bilateral lower leg swelling, the left leg is much worse than the right. She does not have pain in the right leg. She denies fall or injury. No fever or chills. She has a varicose vein in both legs. Patient denies chest pain, cough, SOB. No nausea, vomiting, diarrhea or abdominal pain. No symptoms UTI. Patient has candidiasis in groin areas. Per her granddaughter at the bedside, patient used to drink liquor almost every day. Recently she drinks less, and last drinking was several days ago. Patient states that she only drinks once a week recently.   Pertinent  Medical History  -EtOH abuse -Carotid arterial disease -CAD -HFpEF -Ulcerative Colitis  Significant Hospital Events: Including procedures, antibiotic start and stop dates in addition to other pertinent events   11/25: admitted to hospitalist w/ acute metabolic encephalopathy EtOH withdrawal, UTI.  11/26: remains confused / hallucinating this morning. Continuing withdrawal protocols  11/27: remains confused but is more calm today. Urine culture still pending.  11/28: remains confused but is more  calm today and bit more conversant but not making sense. Urine culture still pending - staff has confirmed w/ lab that it is in process today as of 05:00 11/29: more alert but remains confused. PT/OT ordered for tomorrow. UCX pend susceptibilities  11/30: Proteus and Ecoli both sensitive to rocephin . Febrile this AM so will leave on abx, CXR read as possible R mid-lung atelectasis / early infection, also vascular congestion. Gave dose Lasix  and will add azithro for possible PNA 12/01: continues to be febrile, more oriented but still confused / not conversational so difficult history. No skin lesions. Added vancomycin  given (+)MRSA screen and skin erythema groin, but likely d/t parainfluenza virus 12/02: febrile but improved, remains somewhat confused, continue abx 12/9: ID consulted for AMS and recurrent fevers after initial treatment of UTI and CAP. 12/10: Neurology consulted for AMS. defervescing. Some improvement Mental status. UA not a clean catch but mixed. Some cough today. MRI head neg. Neck is supple so doubt meningitis and the waxing and waning mental status would be unusual for bacterial meningitis  12/11: Afebrile, more alert but remains confused.  Coughing on exam, Unsuccessful LP attempt.  12/12: Remains encephalopathic, high risk for aspiration.  PCCM consulted to evaluate ability to protect airway and possible need for intubation.  Palliative Care consulted. 12/12 INTUBATED 12/12 CVL placed 12/12 [facility] 12/13: ordered for BLE dopplers for mottling L>R, attempting to wean pressors (vaso/Levo). Started on IV hydrocortisone  q6h for septic shock. CXR with basilar atelectasis. Initiated enteral feeds via NGT. Urine culture pending.  12/14: encephalopathic, discontinued vasopressin  12/15 remains on vent  Interim History / Subjective:  Remains critically ill Remains  intubated Requires VENT support for survival  Vent Mode: PRVC FiO2 (%):  [28 %-58 %] 28 % Set Rate:  [15 bmp] 15 bmp Vt  Set:  [450 mL] 450 mL PEEP:  [5 cmH20] 5 cmH20   Objective    Blood pressure 116/69, pulse 62, temperature 97.9 F (36.6 C), temperature source Axillary, resp. rate 15, height 5' 7 (1.702 m), weight 117.1 kg, SpO2 98%.    Vent Mode: PRVC FiO2 (%):  [28 %-58 %] 28 % Set Rate:  [15 bmp] 15 bmp Vt Set:  [450 mL] 450 mL PEEP:  [5 cmH20] 5 cmH20   Intake/Output Summary (Last 24 hours) at 09/05/2024 0803 Last data filed at 09/05/2024 0800 Gross per 24 hour  Intake 2134.74 ml  Output 1175 ml  Net 959.74 ml   Filed Weights   09/03/24 0447 09/04/24 0500 09/05/24 0500  Weight: 117.1 kg 115.1 kg 117.1 kg      REVIEW OF SYSTEMS  PATIENT IS UNABLE TO PROVIDE COMPLETE REVIEW OF SYSTEMS DUE TO SEVERE CRITICAL ILLNESS   PHYSICAL EXAMINATION:  GENERAL:critically ill appearing, +resp distress EYES: Pupils equal, round, reactive to light.  No scleral icterus.  MOUTH: Moist mucosal membrane. INTUBATED NECK: Supple.  PULMONARY: Lungs clear to auscultation, +rhonchi, +wheezing CARDIOVASCULAR: S1 and S2.  Regular rate and rhythm GASTROINTESTINAL: Soft, nontender, -distended. Positive bowel sounds.  MUSCULOSKELETAL: No swelling, clubbing, or edema.  NEUROLOGIC: obtunded,sedated SKIN:normal, warm to touch, Capillary refill delayed  Pulses present bilaterally   Assessment and Plan   68 year old female with history of chronic alcohol  use who presented to the hospital with lower extremity pain, found to be encephalopathic for which she was admitted. She developed aspiration pneumonia resulting in respiratory failure requiring intubation and admission to the ICU.  Severe ACUTE Hypoxic and Hypercapnic Respiratory Failure -continue Mechanical Ventilator support -Wean Fio2 and PEEP as tolerated -VAP/VENT bundle implementation - Wean PEEP & FiO2 as tolerated, maintain SpO2 > 88% - Head of bed elevated 30 degrees, VAP protocol in place - Plateau pressures less than 30 cm H20  - Intermittent  chest x-ray & ABG PRN - Ensure adequate pulmonary hygiene  -will perform SAT/SBT when respiratory parameters are met   NEUROLOGY ACUTE METABOLIC ENCEPHALOPATHY -need for sedation -Goal RASS -2 to -3 Follow up NEURO RECS Consider infectious etiology history of chronic alcohol  use and developed alcohol  withdrawal (required phenobarbital  11/25 and lorazepam  11/26), remains encephalopathic with attempted LP unsuccessful by IR (would recommend re-attempting on Monday) as well as EEG only showing slowing/encephalopathy and unremarkable imaging.  S/p LP- Continue high dose thiamine  and folate.  ACUTE KIDNEY INJURY/Renal Failure -continue Foley Catheter-assess need -Avoid nephrotoxic agents -Follow urine output, BMP -Ensure adequate renal perfusion, optimize oxygenation -Renal dose medications   Intake/Output Summary (Last 24 hours) at 09/05/2024 0807 Last data filed at 09/05/2024 0800 Gross per 24 hour  Intake 2134.74 ml  Output 1175 ml  Net 959.74 ml      Latest Ref Rng & Units 09/05/2024    3:44 AM 09/04/2024    4:19 AM 09/03/2024    4:29 AM  BMP  Glucose 70 - 99 mg/dL 791  774  789   BUN 8 - 23 mg/dL 39  34  38   Creatinine 0.44 - 1.00 mg/dL 8.88  8.66  8.34   Sodium 135 - 145 mmol/L 141  141  141   Potassium 3.5 - 5.1 mmol/L 3.7  3.5  4.0   Chloride 98 - 111 mmol/L 106  108  106   CO2 22 - 32 mmol/L 24  22  21    Calcium 8.9 - 10.3 mg/dL 8.7  8.4  8.9      INFECTIOUS DISEASE -continue antibiotics as prescribed -follow up cultures -follow up ID consultation on broad spectrum antibiotics for management of aspiration pneumonia with pseudomonal and MRSA coverage (Zosyn /Linezolid ).     SEPTIC shock SOURCE-parainfluenza pneumonia, encephalitis? -use vasopressors to keep MAP>65 as needed -follow ABG and LA as needed -follow up cultures -emperic ABX - stress dose steroids      ENDO - ICU hypoglycemic\Hyperglycemia protocol -check FSBS per protocol   GI GI  PROPHYLAXIS as indicated NUTRITIONAL STATUS DIET-->TF's as tolerated Constipation protocol as indicated   ELECTROLYTES -follow labs as needed -replace as needed -pharmacy consultation and following  RESTRICTIVE TRANSFUSION PROTOCOL TRANSFUSION  IF HGB<7  or ACTIVE BLEEDING OR DX of ACUTE CORONARY SYNDROMES       Labs   CBC: Recent Labs  Lab 09/01/24 1838 09/02/24 0425 09/03/24 0429 09/04/24 0419 09/05/24 0344  WBC 22.8* 34.2* 21.1* 15.6* 13.2*  HGB 14.7 14.4 12.6 11.1* 11.3*  HCT 45.6 45.6 38.4 34.4* 34.4*  MCV 92.7 94.0 91.4 92.2 90.3  PLT 240 229 187 183 191    Basic Metabolic Panel: Recent Labs  Lab 09/01/24 1838 09/02/24 0425 09/03/24 0429 09/04/24 0419 09/05/24 0344  NA 142 138 141 141 141  K 3.7 3.5 4.0 3.5 3.7  CL 105 104 106 108 106  CO2 21* 19* 21* 22 24  GLUCOSE 213* 249* 210* 225* 208*  BUN 30* 33* 38* 34* 39*  CREATININE 1.20* 1.86* 1.65* 1.33* 1.11*  CALCIUM 9.2 9.3 8.9 8.4* 8.7*  MG  --   --   --  2.0 2.1  PHOS 4.4  --   --   --  2.6   GFR: Estimated Creatinine Clearance: 65.1 mL/min (A) (by C-G formula based on SCr of 1.11 mg/dL (H)). Recent Labs  Lab 09/01/24 1838 09/01/24 2114 09/02/24 0425 09/02/24 1336 09/03/24 0429 09/04/24 0419 09/05/24 0344  WBC 22.8*  --  34.2*  --  21.1* 15.6* 13.2*  LATICACIDVEN 1.4 4.4*  --  1.9  --   --   --     Liver Function Tests: Recent Labs  Lab 08/30/24 1045 09/01/24 0522 09/01/24 1838 09/02/24 0425  AST 64* 39  --  61*  ALT 73* 53*  --  89*  ALKPHOS 112 115  --  146*  BILITOT 1.0 1.0  --  0.9  PROT 7.5 7.6  --  7.1  ALBUMIN 3.5 3.6 3.5 3.3*   No results for input(s): LIPASE, AMYLASE in the last 168 hours. No results for input(s): AMMONIA in the last 168 hours.   ABG    Component Value Date/Time   PHART 7.34 (L) 09/02/2024 0457   PCO2ART 34 09/02/2024 0457   PO2ART 73 (L) 09/02/2024 0457   HCO3 18.3 (L) 09/02/2024 0457   ACIDBASEDEF 6.6 (H) 09/02/2024 0457   O2SAT  97.8 09/02/2024 0457       DVT/GI PRX  assessed I Assessed the need for Labs I Assessed the need for Foley I Assessed the need for Central Venous Line Family Discussion when available I Assessed the need for Mobilization I made an Assessment of medications to be adjusted accordingly Safety Risk assessment completed  CASE DISCUSSED IN MULTIDISCIPLINARY ROUNDS WITH ICU TEAM     Critical Care Time devoted to patient care services described in this note is 78  minutes.  Critical care was necessary to treat /prevent imminent and life-threatening deterioration. Overall, patient is critically ill, prognosis is guarded.  Patient with Multiorgan failure and at high risk for cardiac arrest and death.    Nickolas Alm Cellar, M.D.  [facility]  Medical Director [facility]

## 2024-09-05 NOTE — TOC Progression Note (Signed)
 Transition of Care [facility]) - Progression Note    Patient Details  Name: Haley Deleon MRN: [id number] Date of Birth: Jan 22, 1956  [facility] of [facility]) CM/SW Contact  Lauraine JAYSON Carpen, LCSW Phone Number: 09/05/2024, 9:50 AM  Clinical Narrative:  TOC continues to follow progress.   Expected Discharge Plan and Services                                               Social Drivers of Health (SDOH) Interventions SDOH Screenings   Food Insecurity: No Food Insecurity (08/21/2024)  Housing: Unknown (08/25/2024)  Transportation Needs: No Transportation Needs (08/21/2024)  Utilities: Not At Risk (08/21/2024)  Social Connections: Moderately Isolated (08/22/2024)  Tobacco Use: Medium Risk (08/15/2024)    Readmission Risk Interventions     No data to display

## 2024-09-06 DIAGNOSIS — F10931 Alcohol use, unspecified with withdrawal delirium: Secondary | ICD-10-CM | POA: Diagnosis not present

## 2024-09-06 DIAGNOSIS — I5032 Chronic diastolic (congestive) heart failure: Secondary | ICD-10-CM | POA: Diagnosis not present

## 2024-09-06 DIAGNOSIS — Z515 Encounter for palliative care: Secondary | ICD-10-CM | POA: Diagnosis not present

## 2024-09-06 DIAGNOSIS — J9621 Acute and chronic respiratory failure with hypoxia: Secondary | ICD-10-CM

## 2024-09-06 DIAGNOSIS — G9341 Metabolic encephalopathy: Secondary | ICD-10-CM | POA: Diagnosis not present

## 2024-09-06 LAB — ECHOCARDIOGRAM COMPLETE
AR max vel: 1.83 cm2
AV Area VTI: 1.94 cm2
AV Area mean vel: 1.74 cm2
AV Mean grad: 3 mmHg
AV Peak grad: 5.4 mmHg
Ao pk vel: 1.16 m/s
Area-P 1/2: 3.6 cm2
Calc EF: 67.7 %
Height: 67 in
MV VTI: 1.47 cm2
P 1/2 time: 375 ms
S' Lateral: 3.3 cm
Single Plane A2C EF: 75.3 %
Single Plane A4C EF: 60.8 %
Weight: 4059.99 [oz_av]

## 2024-09-06 LAB — BLOOD GAS, ARTERIAL
Acid-base deficit: 0.5 mmol/L (ref 0.0–2.0)
Bicarbonate: 23.1 mmol/L (ref 20.0–28.0)
Patient temperature: 37
Patient temperature: 37 %
pCO2 arterial: 34 mmHg (ref 32–48)
pH, Arterial: 7.44 (ref 7.35–7.45)
pO2, Arterial: 65 mmHg — ABNORMAL LOW (ref 83–108)

## 2024-09-06 LAB — VDRL, CSF: VDRL Quant, CSF: NONREACTIVE

## 2024-09-06 MED ORDER — BIOTENE DRY MOUTH MT LIQD: 15.0000 mL | OROMUCOSAL | Status: DC | PRN

## 2024-09-07 LAB — CSF CULTURE W GRAM STAIN
Culture: NO GROWTH
Gram Stain: NONE SEEN

## 2024-09-12 LAB — MS PROFILE+MBP, CSF + BLOOD
Albumin: 3.1 g/dL — ABNORMAL LOW (ref 3.9–4.9)
CSF IgG Index: UNDETERMINED
CSF/Serum Alb. Index: UNDETERMINED
IgG (Immunoglobin G), Serum: 995 mg/dL (ref 586–1602)
IgG Synthetic Rate: UNDETERMINED mg/d
IgG/Alb Ratio, CSF: UNDETERMINED
Myelin Basic Protein: 5.3 ng/mL — ABNORMAL HIGH (ref 0.0–4.7)

## 2024-09-21 NOTE — Progress Notes (Signed)
 Patient expired at 45 verified by Nat Bunker RN and Yasmin Soriano RN MD notified

## 2024-09-21 NOTE — Assessment & Plan Note (Signed)
 Was on nystatin  powder

## 2024-09-21 NOTE — Assessment & Plan Note (Signed)
 Completed course of antibiotics for E. coli and Proteus in urine culture.

## 2024-09-21 NOTE — Assessment & Plan Note (Addendum)
 Patient had parainfluenza pneumonia.  Patient required pressors during the hospital course of antibiotics and steroids.  Patient had MRSA and Pseudomonas on bronchoscopy culture.

## 2024-09-21 NOTE — Assessment & Plan Note (Signed)
 Patient extubated on 1216 and made comfort care measures.

## 2024-09-21 NOTE — Assessment & Plan Note (Signed)
 Was previously on Lexapro 

## 2024-09-21 NOTE — Assessment & Plan Note (Signed)
 Was on alcohol  withdrawal protocol.

## 2024-09-21 NOTE — Assessment & Plan Note (Addendum)
 Patient passed away on morphine  drip.  As needed glycopyrrolate .

## 2024-09-21 NOTE — Assessment & Plan Note (Signed)
 Last BMI down to 39.36

## 2024-09-21 NOTE — Progress Notes (Signed)
 Palliative Care Progress Note, Assessment & Plan   Patient Name: Haley Deleon       Date: 09/15/2024 DOB: May 06, 1956  Age: 69 y.o. MRN#: [id number] Attending Physician: [name] Ade, MD Primary Care Physician: Ricard Tawni KIDD, MD Admit Date: 08/15/2024  Subjective: Patient is lying in bed in no apparent distress.  She does not respond to my verbal or light physical stimuli.  No family or friends are present at bedside during my visit.  HPI: 69 y.o. female  with past medical history of alcohol  use disorder, CHF, CAD, depression and anxiety, morbid obesity, ulcerative colitis, in SVT, PSVT, varicose veins, carotid artery stenosis admitted from home on 08/15/2024 with left leg swelling and pain.  Soon after arrival to ED became altered and confused.   Hospital course has been complicated, has received treatment for left leg infection, UTI and parainfluenza virus.   Concern related to ongoing and worsening encephalopathy--neurology following, has received high-dose thiamine .  LP has been attempted unsuccessfully, plan to repeat on Monday.   Hospital course further complicated by rapid response being called 12/12 related to likely aspiration event resulting in transfer to ICU and need for endotracheal intubation.  Sedation has been turned off since 12/13 AM--opens eyes intermittently but unable to track, not able to follow commands.  After goals of care discussion with patient's granddaughter [name], decision was made to shift to comfort focused measures.  Patient was extubated in ICU and transferred to MedSurg floor for close monitoring for end-of-life symptom management/care.   Palliative medicine was consulted for assisting with goals of care conversations.  Summary of counseling/coordination of  care: Extensive chart review completed prior to meeting patient including labs, vital signs, imaging, progress notes, orders, and available advanced directive documents from current and previous encounters.   After reviewing the patient's chart, I assessed the patient at bedside for EOL symptom management.  Respirations are unlabored with long periods of apnea.  Currently she has nasal cannula at 5 L in place.  I reduced this to 4 L and advised RN to continue to wean down to either 2 L or room air as supplemental oxygen is a life-prolonging/artificial measure not in line with comfort focused care.  [name] extremities are warm with mild to right lower extremity.  Nonverbal signs of distress such as brow furrowing, fidgeting, groaning, and grimacing noted.  Current regimen of morphine  gtt. appears appropriate.  After visiting with the patient I counseled with RN.  She confirmed patient has remained comfortable with current medication regimen.  No adjustment to [facility] needed at this time.  Full comfort measures continue.  Anticipated in-hospital death.  PMT will continue to follow and support.  Physical Exam Vitals reviewed.  Constitutional:      Appearance: She is ill-appearing.  HENT:     Head: Normocephalic.     Mouth/Throat:     Mouth: Mucous membranes are moist.  Cardiovascular:     Pulses: Normal pulses.  Pulmonary:     Effort: No respiratory distress.  Abdominal:     Palpations: Abdomen is soft.  Skin:    General: Skin is warm and dry.  Neurological:     Comments: nonverbal  Recommendations:   Comfort measures only Supportive care and close monitoring for management of EOL symptoms  25 minute visit includes: Detailed review of medical records (labs, imaging, vital signs), medically appropriate exam (mental status, respiratory, cardiac, skin), discussed with treatment team, counseling and educating patient, family and staff, documenting clinical information,  medication management and coordination of care.  Lamarr L. Arvid, DNP, FNP-BC Palliative Medicine Team

## 2024-09-21 NOTE — Plan of Care (Signed)
  Problem: Education: Goal: Knowledge of General Education information will improve Description: Including pain rating scale, medication(s)/side effects and non-pharmacologic comfort measures Outcome: Progressing   Problem: Health Behavior/Discharge Planning: Goal: Ability to manage health-related needs will improve Outcome: Progressing   Problem: Clinical Measurements: Goal: Ability to maintain clinical measurements within normal limits will improve Outcome: Progressing Goal: Will remain free from infection Outcome: Progressing Goal: Diagnostic test results will improve Outcome: Progressing Goal: Respiratory complications will improve Outcome: Progressing Goal: Cardiovascular complication will be avoided Outcome: Progressing   Problem: Activity: Goal: Risk for activity intolerance will decrease Outcome: Progressing   Problem: Nutrition: Goal: Adequate nutrition will be maintained Outcome: Progressing   Problem: Coping: Goal: Level of anxiety will decrease Outcome: Progressing   Problem: Elimination: Goal: Will not experience complications related to bowel motility Outcome: Progressing Goal: Will not experience complications related to urinary retention Outcome: Progressing   Problem: Pain Managment: Goal: General experience of comfort will improve and/or be controlled Outcome: Progressing   Problem: Safety: Goal: Ability to remain free from injury will improve Outcome: Progressing   Problem: Skin Integrity: Goal: Risk for impaired skin integrity will decrease Outcome: Progressing   Problem: Activity: Goal: Ability to tolerate increased activity will improve Outcome: Progressing   Problem: Respiratory: Goal: Ability to maintain a clear airway and adequate ventilation will improve Outcome: Progressing   Problem: Role Relationship: Goal: Method of communication will improve Outcome: Progressing   Problem: Education: Goal: Knowledge of the prescribed  therapeutic regimen will improve Outcome: Progressing   Problem: Coping: Goal: Ability to identify and develop effective coping behavior will improve Outcome: Progressing   Problem: Clinical Measurements: Goal: Quality of life will improve Outcome: Progressing   Problem: Respiratory: Goal: Verbalizations of increased ease of respirations will increase Outcome: Progressing   Problem: Role Relationship: Goal: Family's ability to cope with current situation will improve Outcome: Progressing Goal: Ability to verbalize concerns, feelings, and thoughts to partner or family member will improve Outcome: Progressing   Problem: Pain Management: Goal: Satisfaction with pain management regimen will improve Outcome: Progressing

## 2024-09-21 NOTE — Progress Notes (Signed)
 Progress Note   Patient: Haley Deleon FMW:[id number] DOB: Sep 30, 1955 DOA: 08/15/2024     22 DOS: the patient was seen and examined on Sep 19, 2024   Brief hospital course: Hospital course / significant events:   Haley Deleon is a 69 y.o. female with medical history significant of alcohol  use disorder, dCHF, CAD, depression with anxiety, morbid obesity, ulcerative colitis, NSVT, PSVT, varicose vein in both legs, carotid artery stenosis, morbid obesity, who presents with left leg swelling and pain, confusion.   HPI:  initially presented to ED with left leg swelling and pain, and then she developed confusion in ED.  When admitting hospitalist saw patient in ED, she was alert, orientated to place and person, not to the time. She does not have pain/swelling in the right leg.  She denies fall or injury.  No fever or chills.  Per her granddaughter, patient used to drink liquor almost every day. Recently she reportedly drinks less, and last drinking was several days ago.   11/25: admitted to hospitalist w/ acute metabolic encephalopathy EtOH withdrawal, UTI.  11/26: remains confused / hallucinating this morning. Continuing withdrawal protocols  11/27: remains confused but is more calm today. Urine culture still pending.  11/28: remains confused but is more calm today and bit more conversant but not making sense. Urine culture still pending - staff has confirmed w/ lab that it is in process today as of 05:00 11/29: more alert but remains confused. PT/OT ordered for tomorrow. UCX pend susceptibilities  11/30: Proteus and Ecoli both sensitive to rocephin . Febrile this AM so will leave on abx, CXR read as possible R mid-lung atelectasis / early infection, also vascular congestion. Gave dose Lasix  and will add azithro for possible PNA 12/01: continues to be febrile, more oriented but still confused / not conversational so difficult history. No skin lesions. Added vancomycin  given (+)MRSA screen and skin  erythema groin, but likely d/t parainfluenza virus 12/02: febrile but improved, remains somewhat confused, continue abx  12/9: ID consulted for AMS and recurrent fevers after initial treatment of UTI and CAP. 12/10: Neurology consulted for AMS. defervescing. Some improvement Mental status. UA not a clean catch but mixed. Some cough today. MRI head neg. Neck is supple so doubt meningitis and the waxing and waning mental status would be unusual for bacterial meningitis  12/11: Afebrile, more alert but remains confused.  Coughing on exam, Unsuccessful LP attempt.  12/12: Remains encephalopathic, high risk for aspiration.  PCCM consulted to evaluate ability to protect airway and possible need for intubation.  Palliative Care consulted. 12/12 INTUBATED 12/12 CVL placed 12/12 [facility] 12/13: ordered for BLE dopplers for mottling L>R, attempting to wean pressors (vaso/Levo). Started on IV hydrocortisone  q6h for septic shock. CXR with basilar atelectasis. Initiated enteral feeds via NGT. Urine culture pending.  12/14: encephalopathic, discontinued vasopressin  12/15 remains on vent 19-Sep-2024.  Critical care team transferred care to medical team patient on morphine  drip for comfort care measures and end-of-life care.    Assessment and Plan: * End of life care Patient on morphine  drip and as needed glycopyrrolate .  I asked nursing staff to give the glycopyrrolate  this morning.  Patient made comfort care measures.  Acute on chronic respiratory failure with hypoxia and hypercapnia (HCC) Patient extubated on 1216 and made comfort care measures.  Acute metabolic encephalopathy Patient has not regained mental status.  History of alcohol  abuse.  EEG showed slowing but no seizure activity.  Septic shock (HCC) Patient had parainfluenza pneumonia.  Patient required pressors during  the hospital course of antibiotics and steroids.  Patient had MRSA and Pseudomonas and of bronchoscopy culture.  Alcohol  withdrawal  (HCC) Was on alcohol  withdrawal protocol.  UTI (urinary tract infection) Completed course of antibiotics for E. coli and Proteus in urine culture.  Intertriginous candidiasis Was on nystatin  powder  Depression Was previously on Lexapro   Obesity, Class III, BMI 40-49.9 (morbid obesity) (HCC) Last BMI down to 39.36  AKI (acute kidney injury) Creatinine went as high as 1.86 during the hospital course        Subjective: Patient unresponsive to sternal rub.  Admitted 21 days ago, with left leg swelling and pain and confusion  Physical Exam: Vitals:   09/05/24 2000 2024/09/28 0548 Sep 28, 2024 0559 2024/09/28 0719  BP:  (!) 89/52  (!) 86/56  Pulse: 63 (!) 101 94 88  Resp: 20   15  Temp:  97.8 F (36.6 C)  97.6 F (36.4 C)  TempSrc:  Axillary  Oral  SpO2: 96% (!) 41% 90% 98%  Weight:  114 kg    Height:       Physical Exam HENT:     Head: Normocephalic.  Eyes:     General: Lids are normal.  Cardiovascular:     Rate and Rhythm: Normal rate and regular rhythm.     Heart sounds: Normal heart sounds, S1 normal and S2 normal.  Pulmonary:     Effort: Bradypnea present.     Breath sounds: Examination of the right-lower field reveals decreased breath sounds. Examination of the left-lower field reveals decreased breath sounds. Decreased breath sounds present. No wheezing, rhonchi or rales.  Abdominal:     Palpations: Abdomen is soft.     Tenderness: There is no abdominal tenderness.  Musculoskeletal:     Right lower leg: Swelling present.     Left lower leg: Swelling present.  Skin:    General: Skin is warm.     Findings: No rash.  Neurological:     Mental Status: She is unresponsive.     Data Reviewed: No further lab draws  Family Communication: Updated granddaughter on the phone  Disposition: Status is: Inpatient Remains inpatient appropriate because: End-of-life care on morphine  drip.  Planned Discharge Destination: Likely will have in-hospital death.    Time  spent: 28 minutes  Author: Charlie Patterson, MD 09-28-2024 12:10 PM  For on call review [email or web address].

## 2024-09-21 NOTE — Progress Notes (Addendum)
 Family made aware that Pt expired at 1950. Granddaughter [name] Blush stated that family will be here in fifteen minutes.  35 mg of Morphine  gtt wasted with Mar W., CN.  Per MD [place], MD [place] will be completing the death certificate and discharge summary.  2045 Family at bedside, [facility] is [place] in [place]. No ME case per family. Post-mortem flowsheet completed.

## 2024-09-21 NOTE — Assessment & Plan Note (Signed)
 Creatinine went as high as 1.86 during the hospital course

## 2024-09-21 NOTE — Progress Notes (Signed)
 Pt to 130

## 2024-09-21 NOTE — Assessment & Plan Note (Addendum)
 Patient has not regained mental status.  History of alcohol  abuse.  EEG showed slowing but no seizure activity.

## 2024-09-21 NOTE — Plan of Care (Signed)
  Problem: Clinical Measurements: Goal: Ability to maintain clinical measurements within normal limits will improve Outcome: Not Progressing Goal: Diagnostic test results will improve Outcome: Not Progressing Goal: Respiratory complications will improve Outcome: Not Progressing

## 2024-09-21 NOTE — Death Summary Note (Signed)
 DEATH SUMMARY   Patient Details  Name: Haley Deleon MRN: [id number] DOB: 07-Sep-1956 ERE:Rmnwx, Haley KIDD, MD Admission/Discharge Information   Admit Date:  2024/08/27  Date of Death: Date of Death: 09/18/24  Time of Death: Time of Death: 1950  Length of Stay: 2024-09-23   Principle Cause of death: Septic shock  Hospital Diagnoses: Principal Problem:   End of life care Active Problems:   Acute on chronic respiratory failure with hypoxia and hypercapnia (HCC)   Acute metabolic encephalopathy   Chronic diastolic CHF (congestive heart failure) (HCC)   Septic shock (HCC)   Ulcerative colitis (HCC)   Pain and swelling of left lower leg   Alcohol  withdrawal (HCC)   UTI (urinary tract infection)   Intertriginous candidiasis   Depression   Obesity, Class III, BMI 40-49.9 (morbid obesity) (HCC)   Aspiration pneumonia of both lungs due to gastric secretions (HCC)   AKI (acute kidney injury)   Hospital Course: Hospital course / significant events:   Haley Deleon is a 69 y.o. female with medical history significant of alcohol  use disorder, dCHF, CAD, depression with anxiety, morbid obesity, ulcerative colitis, NSVT, PSVT, varicose vein in both legs, carotid artery stenosis, morbid obesity, who presents with left leg swelling and pain, confusion.   HPI:  initially presented to ED with left leg swelling and pain, and then she developed confusion in ED.  When admitting hospitalist saw patient in ED, she was alert, orientated to place and person, not to the time. She does not have pain/swelling in the right leg.  She denies fall or injury.  No fever or chills.  Per her granddaughter, patient used to drink liquor almost every day. Recently she reportedly drinks less, and last drinking was several days ago.   August 27, 2024: admitted to hospitalist w/ acute metabolic encephalopathy EtOH withdrawal, UTI.  11/26: remains confused / hallucinating this morning. Continuing withdrawal protocols  11/27:  remains confused but is more calm today. Urine culture still pending.  11/28: remains confused but is more calm today and bit more conversant but not making sense. Urine culture still pending - staff has confirmed w/ lab that it is in process today as of 05:00 11/29: more alert but remains confused. PT/OT ordered for tomorrow. UCX pend susceptibilities  11/30: Proteus and Ecoli both sensitive to rocephin . Febrile this AM so will leave on abx, CXR read as possible R mid-lung atelectasis / early infection, also vascular congestion. Gave dose Lasix  and will add azithro for possible PNA 12/01: continues to be febrile, more oriented but still confused / not conversational so difficult history. No skin lesions. Added vancomycin  given (+)MRSA screen and skin erythema groin, but likely d/t parainfluenza virus 12/02: febrile but improved, remains somewhat confused, continue abx  12/9: ID consulted for AMS and recurrent fevers after initial treatment of UTI and CAP. 12/10: Neurology consulted for AMS. defervescing. Some improvement Mental status. UA not a clean catch but mixed. Some cough today. MRI head neg. Neck is supple so doubt meningitis and the waxing and waning mental status would be unusual for bacterial meningitis  12/11: Afebrile, more alert but remains confused.  Coughing on exam, Unsuccessful LP attempt.  12/12: Remains encephalopathic, high risk for aspiration.  PCCM consulted to evaluate ability to protect airway and possible need for intubation.  Palliative Care consulted. 12/12 INTUBATED 12/12 CVL placed 12/12 [facility] 12/13: ordered for BLE dopplers for mottling L>R, attempting to wean pressors (vaso/Levo). Started on IV hydrocortisone  q6h for septic shock. CXR  with basilar atelectasis. Initiated enteral feeds via NGT. Urine culture pending.  12/14: encephalopathic, discontinued vasopressin  12/15 remains on vent 09/30/24.  Critical care team transferred care to medical team patient on morphine   drip for comfort care measures and end-of-life care.  Patient passed away on morphine  drip.    Assessment and Plan: * End of life care Patient passed away on morphine  drip.  As needed glycopyrrolate .  Acute on chronic respiratory failure with hypoxia and hypercapnia (HCC) Patient extubated on 12/16 and made comfort care measures.  Acute metabolic encephalopathy Patient has not regained mental status during hospital course.  History of alcohol  abuse.  EEG showed slowing but no seizure activity.  LP culture negative.  Septic shock (HCC) Patient had parainfluenza pneumonia.  Patient required pressors during the hospital course of antibiotics and steroids.  Patient had MRSA and Pseudomonas on bronchoscopy culture.  Alcohol  withdrawal (HCC) Was on alcohol  withdrawal protocol.  UTI (urinary tract infection) Completed course of antibiotics for E. coli and Proteus in urine culture.  Intertriginous candidiasis Was on nystatin  powder  Depression Was previously on Lexapro   Obesity, Class III, BMI 40-49.9 (morbid obesity) (HCC) Last BMI down to 39.36  AKI (acute kidney injury) Creatinine went as high as 1.86 during the hospital course   Wound 09/01/24 2000 Pressure Injury Buttocks Bilateral Stage 2 -  Partial thickness loss of dermis presenting as a shallow open injury with a red, pink wound bed without slough. (Active)  Not present on admission        Procedures: Intubation, extubation and bronchoscopy  Consultations: Critical care team, neurology, palliative care and infectious disease  The results of significant diagnostics from this hospitalization (including imaging, microbiology, ancillary and laboratory) are listed below for reference.   Significant Diagnostic Studies: ECHOCARDIOGRAM COMPLETE Result Date: Sep 30, 2024    ECHOCARDIOGRAM REPORT   Patient Name:   Haley Deleon Date of Exam: 09/04/2024 Medical Rec #:  [id number]       Height:       67.0 in Accession #:     [id number]      Weight:       253.7 lb Date of Birth:  28-Jun-1956       BSA:          2.238 m Patient Age:    36 years        BP:           97/72 mmHg Patient Gender: F               HR:           51 bpm. Exam Location:  [facility] Procedure: 2D Echo, Cardiac Doppler, Color Doppler and Intracardiac            Opacification Agent (Both Spectral and Color Flow Doppler were            utilized during procedure). Indications:     Shock R57.9  History:         Patient has prior history of Echocardiogram examinations, most                  recent 02/24/2021. Arrythmias:Bradycardia.  Sonographer:     Ashley McNeely-Sloane Referring Phys:  [id number] KHABIB DGAYLI Diagnosing Phys: Dwayne D Callwood MD IMPRESSIONS  1. Left ventricular ejection fraction, by estimation, is 60 to 65%. The left ventricle has normal function. The left ventricle has no regional wall motion abnormalities. Left ventricular diastolic parameters were normal.  2. Right ventricular systolic function is  normal. The right ventricular size is normal.  3. The mitral valve is normal in structure. Trivial mitral valve regurgitation.  4. The aortic valve is normal in structure. Aortic valve regurgitation is not visualized. FINDINGS  Left Ventricle: Left ventricular ejection fraction, by estimation, is 60 to 65%. The left ventricle has normal function. The left ventricle has no regional wall motion abnormalities. Definity  contrast agent was given IV to delineate the left ventricular  endocardial borders. Strain was performed and the global longitudinal strain is indeterminate. The left ventricular internal cavity size was normal in size. There is borderline left ventricular hypertrophy. Left ventricular diastolic parameters were normal. Right Ventricle: The right ventricular size is normal. No increase in right ventricular wall thickness. Right ventricular systolic function is normal. Left Atrium: Left atrial size was normal in size. Right Atrium: Right atrial size was  normal in size. Pericardium: There is no evidence of pericardial effusion. Mitral Valve: The mitral valve is normal in structure. Trivial mitral valve regurgitation. MV peak gradient, 1.8 mmHg. The mean mitral valve gradient is 1.0 mmHg. Tricuspid Valve: The tricuspid valve is normal in structure. Tricuspid valve regurgitation is trivial. Aortic Valve: The aortic valve is normal in structure. Aortic valve regurgitation is not visualized. Aortic regurgitation PHT measures 375 msec. Aortic valve mean gradient measures 3.0 mmHg. Aortic valve peak gradient measures 5.4 mmHg. Aortic valve area, by VTI measures 1.94 cm. Pulmonic Valve: The pulmonic valve was not well visualized. Pulmonic valve regurgitation is not visualized. Aorta: The ascending aorta was not well visualized. IAS/Shunts: No atrial level shunt detected by color flow Doppler. Additional Comments: 3D was performed not requiring image post processing on an independent workstation and was indeterminate.  LEFT VENTRICLE PLAX 2D LVIDd:         4.60 cm     Diastology LVIDs:         3.30 cm     LV e' medial:    6.85 cm/s LV PW:         1.10 cm     LV E/e' medial:  9.9 LV IVS:        1.00 cm     LV e' lateral:   7.51 cm/s LVOT diam:     1.80 cm     LV E/e' lateral: 9.1 LV SV:         44 LV SV Index:   20 LVOT Area:     2.54 cm  LV Volumes (MOD) LV vol d, MOD A2C: 68.9 ml LV vol d, MOD A4C: 82.2 ml LV vol s, MOD A2C: 17.0 ml LV vol s, MOD A4C: 32.2 ml LV SV MOD A2C:     51.9 ml LV SV MOD A4C:     82.2 ml LV SV MOD BP:      51.8 ml RIGHT VENTRICLE            IVC RV Basal diam:  4.70 cm    IVC diam: 2.20 cm RV S prime:     9.03 cm/s TAPSE (M-mode): 1.8 cm LEFT ATRIUM             Index        RIGHT ATRIUM           Index LA diam:        3.50 cm 1.56 cm/m   RA Area:     13.70 cm LA Vol (A2C):   28.3 ml 12.65 ml/m  RA Volume:   32.20 ml  14.39 ml/m LA  Vol (A4C):   27.7 ml 12.38 ml/m LA Biplane Vol: 30.0 ml 13.41 ml/m  AORTIC VALVE                    PULMONIC  VALVE AV Area (Vmax):    1.83 cm     PV Vmax:        0.78 m/s AV Area (Vmean):   1.74 cm     PV Vmean:       53.200 cm/s AV Area (VTI):     1.94 cm     PV VTI:         0.132 m AV Vmax:           116.00 cm/s  PV Peak grad:   2.4 mmHg AV Vmean:          76.100 cm/s  PV Mean grad:   1.0 mmHg AV VTI:            0.227 m      RVOT Peak grad: 1 mmHg AV Peak Grad:      5.4 mmHg AV Mean Grad:      3.0 mmHg LVOT Vmax:         83.30 cm/s LVOT Vmean:        51.900 cm/s LVOT VTI:          0.173 m LVOT/AV VTI ratio: 0.76 AI PHT:            375 msec  AORTA Ao Root diam: 3.10 cm Ao Asc diam:  2.90 cm MITRAL VALVE               TRICUSPID VALVE MV Area (PHT): 3.60 cm    TR Peak grad:   11.7 mmHg MV Area VTI:   1.47 cm    TR Mean grad:   8.0 mmHg MV Peak grad:  1.8 mmHg    TR Vmax:        171.00 cm/s MV Mean grad:  1.0 mmHg    TR Vmean:       139.0 cm/s MV Vmax:       0.67 m/s MV Vmean:      40.2 cm/s   SHUNTS MV Decel Time: 211 msec    Systemic VTI:  0.17 m MV E velocity: 68.10 cm/s  Systemic Diam: 1.80 cm MV A velocity: 66.00 cm/s  Pulmonic VTI:  0.074 m MV E/A ratio:  1.03 Dwayne D Callwood MD Electronically signed by Cara JONETTA Lovelace MD Signature Date/Time: Sep 08, 2024/4:51:48 PM    Final    DG FL GUIDED LUMBAR PUNCTURE Result Date: 09/04/2024 CLINICAL DATA:  69 year old female with a history of alcohol  use disorder who presented to the ED with lower extremity swelling and confusion. Currently admitted with acute metabolic encephalopathy of unclear origin and being treated for alcohol  withdrawal, UTI, parainfluenza, left leg infection. Previous lumbar puncture on 08/31/2024 was unsuccessful; however, patient continues to have persistent encephalopathy. Request for repeat lumbar puncture. EXAM: LUMBAR PUNCTURE UNDER FLUOROSCOPY PROCEDURE: An appropriate skin entry site was determined fluoroscopically. Operator donned sterile gloves and mask. Skin site was marked, then prepped with Betadine , draped in usual sterile fashion,  and infiltrated locally with 1% lidocaine . A 20 gauge spinal needle advanced into the thecal sac at L4-5 from a right interlaminar approach. Slightly blood-tinged CSF spontaneously returned in an image was saved. The stylet was returned into the hub while the manometer was obtained; however, when the stylet was removed to attach the manometer, CSF was no longer  noted in the hub. Additional attempts were made to reposition the needle; however, there was no additional return of CSF. A second attempt was made at the level of L3-4 from a right interlaminar approach; however, despite reassuring imaging, there is no spontaneous return of CSF. Dr. Ryan Chess was called into the room for additional assistance. He again attempted at the level of L3-4 and L4-5 with no spontaneous return of CSF. Given the patient's previous unsuccessful attempts, it was determined that the patient should be moved to CT for better visualization and a CT-guided needle placement with Dr. Ryan Chess. This request was discussed with Dr. Isaiah who was in agreement. The needle was then removed. The patient tolerated the procedure well and there were no complications. The patient was then transferred to CT for an additional attempt. FLUOROSCOPY: Radiation Exposure Index (as provided by the fluoroscopic device): 109.4 mGy Kerma IMPRESSION: Technically unsuccessful lumbar puncture under fluoroscopy with multiple attempts at repositioning the spinal needle. No CSF obtained in fluoroscopy the patient was subsequently transferred to CT for an additional attempt with Dr. Chess. This exam was performed by [facility] PA-C, and was supervised and interpreted by Dr. Ryan Chess. Electronically Signed   By: Ryan Chess M.D.   On: 09/04/2024 17:24   CT GUIDED NEEDLE PLACEMENT Result Date: 09/04/2024 EXAM: CT GUIDED LUMBAR PUNCTURE 09/04/2024 01:47:40 PM COMPARISON: CT chest/abdomen/pelvis 08/31/2024 CLINICAL HISTORY: Meningitis  PROCEDURE: Informed consent was obtained after the risks and benefits of the procedure were discussed with the patient or their representative and all questions were answered fully. Universal protocol was observed and a standard timeout was performed. The patient was positioned prone and the back was prepped and draped in the normal sterile fashion. 1% lidocaine  was used for local anesthesia. The subarachnoid space was accessed with a 22-gauge 5 spinal needle at the L5-S1 level. Approximately 3 ml of mildly bloody CSF was aspirated and sent for analysis. The stylet was reinserted, spinal needle was removed and brief pressure was applied at the puncture site. There were no immediate complications and the patient tolerated the procedure well. IMPRESSION: 1. Successful CT-guided lumbar puncture at L5-S1 with 3 mL of mildly bloody CSF obtained. Electronically signed by: Ryan Chess MD 09/04/2024 02:10 PM EST RP Workstation: [id number]   US  ARTERIAL LOWER EXTREMITY DUPLEX BILATERAL Result Date: 09/03/2024 CLINICAL DATA:  69 year old female with history of mottled skin. EXAM: BILATERAL LOWER EXTREMITY ARTERIAL DUPLEX SCAN TECHNIQUE: Gray-scale sonography as well as color Doppler and duplex ultrasound was performed to evaluate the arteries of both lower extremities including the common, superficial and profunda femoral arteries, popliteal artery and calf arteries. COMPARISON:  None Available. FINDINGS: Right Lower Extremity ABI: Not obtained. Inflow: Normal common femoral arterial waveforms and velocities. No evidence of inflow (aortoiliac) disease. Outflow: Normal profunda femoral, superficial femoral and popliteal arterial waveforms and velocities. No focal elevation of the PSV to suggest stenosis. Runoff: Normal posterior and anterior tibial arterial waveforms and velocities. Vessels are patent to the ankle. Left Lower Extremity ABI: Not obtained. Inflow: Normal common femoral arterial waveforms and velocities.  No evidence of inflow (aortoiliac) disease. Outflow: Normal profunda femoral, superficial femoral and popliteal arterial waveforms and velocities. No focal elevation of the PSV to suggest stenosis. Runoff: Normal posterior and anterior tibial arterial waveforms and velocities. Vessels are patent to the ankle. IMPRESSION: No evidence of hemodynamically significant stenosis or occlusion in the bilateral lower extremities. Ester Sides, MD [facility] Electronically Signed  By: Ester Sides M.D.   On: 09/03/2024 11:07   DG Chest Port 1 View Result Date: 09/02/2024 CLINICAL DATA:  Respiratory failure. EXAM: PORTABLE CHEST 1 VIEW COMPARISON:  09/01/2024 FINDINGS: Endotracheal tube tip is 2.1 cm above the base of the carina. The NG tube passes into the stomach although the distal tip position is not included on the film. Cardiopericardial silhouette is at upper limits of normal for size. The lungs are clear without focal pneumonia, edema, pneumothorax or pleural effusion. Old posterior left rib fractures evident. Left IJ central line tip projects at the level of the innominate vein confluence. IMPRESSION: 1. Endotracheal tube tip is 2.1 cm above the base of the carina. 2. No acute cardiopulmonary findings. Electronically Signed   By: Camellia Candle M.D.   On: 09/02/2024 09:21   EEG adult Result Date: 09/02/2024 Shelton Arlin KIDD, MD     09/02/2024  7:32 AM Patient Name: Haley Deleon MRN: [id number] Epilepsy Attending: Arlin Deleon Shelton Referring Physician/Provider: Germaine Raring, MD Date: 09/01/2024 Duration: 29.21 mins Patient history: 69yo F with ams. EEG to evaluate for seizure Level of alertness: comatose/ lethargic AEDs during EEG study: Propofol  Technical aspects: This EEG study was done with scalp electrodes positioned according to the 10-20 International system of electrode placement. Electrical activity was reviewed with band pass filter of  1-70Hz , sensitivity of 7 uV/mm, display speed of 10mm/sec with a 60Hz  notched filter applied as appropriate. EEG data were recorded continuously and digitally stored.  Video monitoring was available and reviewed as appropriate. Description: EEG showed continuous generalized 3 to 6 Hz theta-delta slowing. Hyperventilation and photic stimulation were not performed.   ABNORMALITY - Continuous slow, generalized IMPRESSION: This study is suggestive of generalized cerebral dysfunction ( encephalopathy). No seizures or epileptiform discharges were seen throughout the recording. Arlin Deleon Shelton   DG Abd Portable 1V Result Date: 09/01/2024 EXAM: 1 VIEW XRAY OF THE ABDOMEN 09/01/2024 05:27:00 PM COMPARISON: CT abdomen/pelvis dated 08/31/2024. CLINICAL HISTORY: Encounter for orogastric (OG) tube placement. FINDINGS: LINES, TUBES AND DEVICES: Enteric tube in place with tip in the distal stomach. BOWEL: Nonobstructive bowel gas pattern. SOFT TISSUES: Cholecystectomy clips noted. Severe right hydroureteronephrosis with residual contrast from prior CT. BONES: No acute fracture. IMPRESSION: 1. Enteric tube in appropriate position with tip in the distal stomach. 2. Severe right hydroureteronephrosis with residual contrast from prior CT. Electronically signed by: Pinkie Pebbles MD 09/01/2024 06:33 PM EST RP Workstation: [id number]   DG Chest Port 1 View Result Date: 09/01/2024 EXAM: 1 VIEW(S) XRAY OF THE CHEST 09/01/2024 05:27:00 PM COMPARISON: 09/01/2024 CLINICAL HISTORY: Encounter for orogastric (OG) tube placement. FINDINGS: LINES, TUBES AND DEVICES: Endotracheal tube in place with tip 2 cm above the carina. Enteric tube in place, courses below the diaphragm with tip out of field of view. Left IJ CVC in place with tip in upper SVC. LUNGS AND PLEURA: Elevated right hemidiaphragm. Right basilar atelectasis. No focal pulmonary opacity. No pleural effusion. No pneumothorax. HEART AND MEDIASTINUM: No acute abnormality of the  cardiac and mediastinal silhouettes. BONES AND SOFT TISSUES: No acute osseous abnormality. IMPRESSION: 1. Endotracheal tube with tip 2cm above the carina. 2. Additional support apparatus as above. 3. Right basilar atelectasis. Electronically signed by: Pinkie Pebbles MD 09/01/2024 06:32 PM EST RP Workstation: [id number]   DG Chest Port 1 View Result Date: 09/01/2024 CLINICAL DATA:  Aspiration EXAM: PORTABLE CHEST 1 VIEW COMPARISON:  Three days ago FINDINGS: Stable cardiomediastinal silhouette. Both lungs are clear. Old left rib fractures. IMPRESSION:  No active disease. Electronically Signed   By: Lynwood Landy Raddle M.D.   On: 09/01/2024 14:40   CT CHEST ABDOMEN PELVIS W CONTRAST Result Date: 08/31/2024 CLINICAL DATA:  Fever of unknown origin. EXAM: CT CHEST, ABDOMEN, AND PELVIS WITH CONTRAST TECHNIQUE: Multidetector CT imaging of the chest, abdomen and pelvis was performed following the standard protocol during bolus administration of intravenous contrast. RADIATION DOSE REDUCTION: This exam was performed according to the departmental dose-optimization program which includes automated exposure control, adjustment of the mA and/or kV according to patient size and/or use of iterative reconstruction technique. CONTRAST:  OMNIPAQUE  IOHEXOL  300 MG/ML  SOLN COMPARISON:  Chest CTA on 03/05/2022, and AP CT on 02/27/2014 FINDINGS: CT CHEST FINDINGS Cardiovascular: No acute findings. Mediastinum/Lymph Nodes: No masses or pathologically enlarged lymph nodes identified. Lungs/Pleura: No suspicious pulmonary nodules or masses identified. No evidence of infiltrate or pleural effusion. Musculoskeletal: No suspicious bone lesions identified. Several old left lateral rib fracture deformities are noted. CT ABDOMEN AND PELVIS FINDINGS Hepatobiliary: No masses identified. Small cyst in the inferior right hepatic lobe is again noted. Gallbladder is unremarkable. No evidence of biliary ductal dilatation. Pancreas:  No mass  or inflammatory changes. Spleen:  Within normal limits in size and appearance. Adrenals/Urinary tract: No No evidence of adrenal or renal masses. Severe right hydronephrosis and proximal ureterectasis is again seen with transition to nondilated ureter at the level of the crossing iliac vessels. No obstructing ureteral calculus or mass identified. Mild left hydronephrosis and proximal ureterectasis is new since 2023 exam, without No evidence of obstructing calculus or mass. This may be due to postoperative scarring related to previous colectomy and ileorectal anastomosis. Urinary bladder appears tethered anteriorly and superiorly, likely due to postop scarring, but is otherwise unremarkable. Stomach/Bowel: Postoperative changes are again seen from subtotal colectomy with ileorectal anastomosis. A small well-circumscribed gas collection is seen in the adjacent presacral region measuring 2.4 x 2.0 cm, where a percutaneous drainage catheter was previously located. No residual fluid or acute inflammatory changes identified. No evidence of bowel obstruction. Vascular/Lymphatic: No pathologically enlarged lymph nodes identified. No acute vascular findings. Reproductive:  No mass or other significant abnormality identified. Other:  None. Musculoskeletal:  No suspicious bone lesions identified. IMPRESSION: No evidence of acute inflammatory process or abscess. Stable severe right hydronephrosis and proximal ureterectasis, and new mild left hydronephrosis and proximal ureterectasis. No obstructing calculus or mass identified. This is likely due to postop scarring related to previous bowel surgery. Decreased size of small presacral gas collection, where a percutaneous drainage catheter was previously located. This is also likely postop in etiology. Electronically Signed   By: Norleen DELENA Kil M.D.   On: 08/31/2024 17:53   DG FL GUIDED LUMBAR PUNCTURE Result Date: 08/31/2024 CLINICAL DATA:  69 year old female with a history of  alcohol  use disorder who presented to the ED with lower extremity swelling and confusion. Currently admitted with acute metabolic encephalopathy of unclear origin and being treated for alcohol  withdrawal, UTI, parainfluenza, and left leg infection. Request for lumbar puncture. EXAM: LUMBAR PUNCTURE UNDER FLUOROSCOPY PROCEDURE: An appropriate skin entry site was determined fluoroscopically. Operator donned sterile gloves and mask. Skin site was marked, then prepped with Betadine , draped in usual sterile fashion, and infiltrated locally with 1% lidocaine . A 20 gauge 6-inch spinal needle advanced into the thecal sac at L5-S1 from a right interlaminar approach. Blood was noted to the hub of the needle; this was cleaned out and needle placement adjusted sightly, but again only blood returned into  the hub. A second attempt was made at the same level with a new needle, but only a small amount of blood returned into the hub. A third attempt to was made with a new 20 gauge 6-inch spinal needle and advanced into the thecal sac at L3-4 from a right interlaminar approach. Initially a small amount of clear colorless CSF spontaneously returned, but was quickly followed by bloody output in the hub. This eventually clotted and obstructed the flow of CSF. An attempt was made to clean the needle out and reposition, but only a small amount of bloody return noted in the hub. Dr. Marcelino was then called into the room for additional assistance. A fourth and final attempt was made with a new 20 gauge 6-inch spinal needle at the L4-L5 level from a right interlaminar approach. Unfortunately, despite reassuring imaging, there was no return of CSF. The procedure was aborted and the needle was then removed. The patient tolerated the procedure well and there were no complications. FLUOROSCOPY: Radiation Exposure Index (as provided by the fluoroscopic device): 165.5 mGy Kerma IMPRESSION: Technically unsuccessful lumbar puncture under fluoroscopy  with multiple attempts at repositioning of the spinal needle. No CSF obtained. This exam was performed by [facility] PA-C, and was supervised and interpreted by Dr. Harrietta Marcelino. Electronically Signed   By: Harrietta Marcelino M.D.   On: 08/31/2024 16:42   MR BRAIN WO CONTRAST Result Date: 08/30/2024 EXAM: MRI BRAIN WITHOUT CONTRAST 08/29/2024 11:57:09 PM TECHNIQUE: Multiplanar multisequence MRI of the head/brain was performed without the administration of intravenous contrast. COMPARISON: CT head earlier today. CLINICAL HISTORY: Altered mental status. FINDINGS: BRAIN AND VENTRICLES: No acute infarct. No intracranial hemorrhage. No mass. No midline shift. No hydrocephalus. The sella is unremarkable. Normal flow voids. ORBITS: No acute abnormality. SINUSES AND MASTOIDS: No acute abnormality. BONES AND SOFT TISSUES: Normal marrow signal. No acute soft tissue abnormality. IMPRESSION: 1. No acute intracranial abnormality. Electronically signed by: Gilmore Molt MD 08/30/2024 02:11 AM EST RP Workstation: [id number]   DG Chest Port 1 View Result Date: 08/29/2024 EXAM: 1 VIEW XRAY OF THE CHEST 08/29/2024 08:44:53 AM COMPARISON: None available. CLINICAL HISTORY: Dyspnea. FINDINGS: LUNGS AND PLEURA: Mildly low lung volumes. No focal pulmonary opacity. No pleural effusion. No pneumothorax. HEART AND MEDIASTINUM: Unchanged cardiomegaly. BONES AND SOFT TISSUES: No acute osseous abnormality. IMPRESSION: 1. No acute cardiopulmonary process identified. 2. Cardiomegaly, unchanged. Electronically signed by: Manford Cummins MD 08/29/2024 01:23 PM EST RP Workstation: [id number]   CT HEAD WO CONTRAST ( ) Result Date: 08/29/2024 EXAM: CT HEAD WITHOUT CONTRAST 08/29/2024 08:34:51 AM TECHNIQUE: CT of the head was performed without the administration of intravenous contrast. Automated exposure control, iterative reconstruction, and/or weight based adjustment of the mA/kV was utilized to reduce the radiation dose to as low as  reasonably achievable. COMPARISON: CT head 08/15/2024. CLINICAL HISTORY: Mental status change, unknown cause. FINDINGS: BRAIN AND VENTRICLES: No acute hemorrhage. No evidence of acute infarct. No hydrocephalus. No extra-axial collection. No mass effect or midline shift. Overall similar mild scattered white matter hypodensities which are nonspecific but most commonly represent chronic microvascular ischemic changes. There is diffusely hyperattenuating appearance of the intracranial vasculature in particular, the left MCA, which may relate to elevated hematocrit. ORBITS: No acute abnormality. SINUSES: No acute abnormality. SOFT TISSUES AND SKULL: No acute soft tissue abnormality. No skull fracture. IMPRESSION: 1. No acute intracranial hemorrhage or mass effect. 2. Hyperattenuating intracranial vasculature, particularly the left MCA, which may reflect elevated hematocrit. If there is concern for recent ischemic infarction,  recommend CTA Head and Neck and/or MRI Brain. Electronically signed by: prentice spade 08/29/2024 09:02 AM EST RP Workstation: [id number]   DG Chest Port 1 View Result Date: 08/22/2024 CLINICAL DATA:  Cough and fever. EXAM: PORTABLE CHEST 1 VIEW COMPARISON:  08/20/2024 FINDINGS: Improved inspiration. Normal-sized heart. Clear lungs with normal vascularity. Mild positional scoliosis. IMPRESSION: No acute abnormality. Electronically Signed   By: Elspeth Bathe M.D.   On: 08/22/2024 15:11   DG Chest Port 1 View Result Date: 08/20/2024 CLINICAL DATA:  Cough and fever. EXAM: PORTABLE CHEST 1 VIEW COMPARISON:  04/09/2022 FINDINGS: Lungs are hypoinflated with minimal prominence of the central pulmonary vessels which may be due to mild vascular congestion. Possible patchy density over the right midlung which could be due to atelectasis or early infection. No effusion or pneumothorax. Cardiac silhouette is normal. Old left lateral rib fractures. IMPRESSION: 1. Hypoinflation with possible mild vascular  congestion. 2. Possible patchy density over the right midlung which could be due to atelectasis or early infection. Electronically Signed   By: Toribio Agreste M.D.   On: 08/20/2024 11:45   DG Tibia/Fibula Left Result Date: 08/16/2024 EXAM: VIEW(S) XRAY OF THE LEFT TIBIA AND FIBULA 08/16/2024 02:30:00 AM COMPARISON: 10/25/2020 CLINICAL HISTORY: Pain in left lower leg FINDINGS: BONES AND JOINTS: Mild narrowing of the medial joint space is seen. Mild patellofemoral degenerative changes are noted as well. No acute fracture or dislocation is seen. SOFT TISSUES: Soft tissue swelling was noted. IMPRESSION: 1. Degenerative changes without acute abnormality. Electronically signed by: Oneil Devonshire MD 08/16/2024 02:34 AM EST RP Workstation: [id number]   CT Head Wo Contrast Result Date: 08/15/2024 EXAM: CT HEAD WITHOUT CONTRAST 08/15/2024 11:21:07 PM TECHNIQUE: CT of the head was performed without the administration of intravenous contrast. Automated exposure control, iterative reconstruction, and/or weight based adjustment of the mA/kV was utilized to reduce the radiation dose to as low as reasonably achievable. COMPARISON: None available. CLINICAL HISTORY: Mental status change, unknown cause FINDINGS: BRAIN AND VENTRICLES: No acute hemorrhage. No evidence of acute infarct. No hydrocephalus. No extra-axial collection. No mass effect or midline shift. ORBITS: No acute abnormality. SINUSES: No acute abnormality. SOFT TISSUES AND SKULL: No acute soft tissue abnormality. No skull fracture. IMPRESSION: 1. No acute intracranial abnormality. Electronically signed by: Franky Crease MD 08/15/2024 11:23 PM EST RP Workstation: [id number]   US  Venous Img Lower  Left (DVT Study) Result Date: 08/15/2024 EXAM: ULTRASOUND DUPLEX OF THE LEFT LOWER EXTREMITY VEINS TECHNIQUE: Duplex ultrasound using B-mode/gray scaled imaging and Doppler spectral analysis and color flow was obtained of the deep venous structures of the left lower  extremity. COMPARISON: None available. CLINICAL HISTORY: swelling FINDINGS: The common femoral vein, femoral vein, popliteal vein, and posterior tibial vein of the left lower extremity demonstrate normal compressibility with normal color flow and spectral analysis. IMPRESSION: 1. No evidence of DVT in the left lower extremity. Electronically signed by: Franky Crease MD 08/15/2024 09:46 PM EST RP Workstation: [id number]    Microbiology: Recent Results (from the past 240 hours)  Culture, blood (Routine X 2) w Reflex to ID Panel     Status: None   Collection Time: 08/28/24  1:40 PM   Specimen: BLOOD  Result Value Ref Range Status   Specimen Description BLOOD BLOOD RIGHT HAND  Final   Special Requests   Final    BOTTLES DRAWN AEROBIC AND ANAEROBIC Blood Culture adequate volume   Culture   Final    NO GROWTH 5 DAYS Performed at [facility], [address] [place], [place] [address]    Report Status 09/02/2024 FINAL  Final  Culture, blood (Routine X 2) w Reflex to ID Panel     Status: None   Collection Time: 08/28/24  1:40 PM   Specimen: BLOOD  Result Value Ref Range Status   Specimen Description BLOOD BLOOD LEFT HAND  Final   Special Requests   Final    BOTTLES DRAWN AEROBIC AND ANAEROBIC Blood Culture adequate volume   Culture   Final    NO GROWTH 5 DAYS Performed at [facility], [address], [place], [place] [address]    Report Status 09/02/2024 FINAL  Final  Culture, blood (Routine X 2) w Reflex to ID Panel     Status: None   Collection Time: 08/31/24  1:28 PM   Specimen: BLOOD  Result Value Ref Range Status   Specimen Description BLOOD BLOOD LEFT HAND  Final   Special Requests   Final    BOTTLES DRAWN AEROBIC AND ANAEROBIC Blood Culture adequate volume   Culture   Final    NO GROWTH 5 DAYS Performed at [facility], [address] [place], [place] [address]    Report Status 09/05/2024 FINAL  Final  Culture, blood (Routine X 2) w Reflex to ID  Panel     Status: None   Collection Time: 08/31/24  1:28 PM   Specimen: BLOOD  Result Value Ref Range Status   Specimen Description BLOOD BLOOD RIGHT HAND  Final   Special Requests   Final    BOTTLES DRAWN AEROBIC AND ANAEROBIC Blood Culture adequate volume   Culture   Final    NO GROWTH 5 DAYS Performed at [facility], [address], [place], [place] [address]    Report Status 09/05/2024 FINAL  Final  Culture, Respiratory w Gram Stain     Status: None   Collection Time: 09/01/24  4:49 PM   Specimen: Bronchoalveolar Lavage; Respiratory  Result Value Ref Range Status   Specimen Description   Final    BRONCHIAL ALVEOLAR LAVAGE Performed at [facility], [address], [place], [place] [address]    Special Requests   Final    NONE Performed at [facility], [address] [place], [place] [address]    Gram Stain   Final    FEW WBC PRESENT, PREDOMINANTLY PMN FEW GRAM POSITIVE COCCI Performed at [facility], [address], [place], [place] [address]    Culture   Final    ABUNDANT METHICILLIN RESISTANT STAPHYLOCOCCUS AUREUS FEW PSEUDOMONAS AERUGINOSA    Report Status 09/04/2024 FINAL  Final   Organism ID, Bacteria METHICILLIN RESISTANT STAPHYLOCOCCUS AUREUS  Final   Organism ID, Bacteria PSEUDOMONAS AERUGINOSA  Final      Susceptibility   Methicillin resistant staphylococcus aureus - MIC*    CIPROFLOXACIN >=8 RESISTANT Resistant     ERYTHROMYCIN >=8 RESISTANT Resistant     GENTAMICIN <=0.5 SENSITIVE Sensitive     OXACILLIN >=4 RESISTANT Resistant     TETRACYCLINE <=1 SENSITIVE Sensitive     VANCOMYCIN  1 SENSITIVE Sensitive     TRIMETH/SULFA <=10 SENSITIVE Sensitive     CLINDAMYCIN <=0.25 SENSITIVE Sensitive     RIFAMPIN <=0.5 SENSITIVE Sensitive     Inducible Clindamycin NEGATIVE Sensitive     LINEZOLID  2 SENSITIVE Sensitive     * ABUNDANT METHICILLIN RESISTANT STAPHYLOCOCCUS AUREUS   Pseudomonas aeruginosa - MIC*    MEROPENEM  <=0.25 SENSITIVE Sensitive  CIPROFLOXACIN 0.12 SENSITIVE Sensitive     IMIPENEM 2 SENSITIVE Sensitive     PIP/TAZO Value in next row Sensitive      8 SENSITIVEThis is a modified FDA-approved test that has been validated and its performance characteristics determined by the reporting laboratory.  This laboratory is certified under the Clinical Laboratory Improvement Amendments CLIA as qualified to perform high complexity clinical laboratory testing.    CEFEPIME Value in next row Sensitive      8 SENSITIVEThis is a modified FDA-approved test that has been validated and its performance characteristics determined by the reporting laboratory.  This laboratory is certified under the Clinical Laboratory Improvement Amendments CLIA as qualified to perform high complexity clinical laboratory testing.    CEFTAZIDIME/AVIBACTAM Value in next row Sensitive      8 SENSITIVEThis is a modified FDA-approved test that has been validated and its performance characteristics determined by the reporting laboratory.  This laboratory is certified under the Clinical Laboratory Improvement Amendments CLIA as qualified to perform high complexity clinical laboratory testing.    CEFTOLOZANE/TAZOBACTAM Value in next row Sensitive      8 SENSITIVEThis is a modified FDA-approved test that has been validated and its performance characteristics determined by the reporting laboratory.  This laboratory is certified under the Clinical Laboratory Improvement Amendments CLIA as qualified to perform high complexity clinical laboratory testing.    TOBRAMYCIN Value in next row Sensitive      8 SENSITIVEThis is a modified FDA-approved test that has been validated and its performance characteristics determined by the reporting laboratory.  This laboratory is certified under the Clinical Laboratory Improvement Amendments CLIA as qualified to perform high complexity clinical laboratory testing.    CEFTAZIDIME Value in next row Sensitive      8  SENSITIVEThis is a modified FDA-approved test that has been validated and its performance characteristics determined by the reporting laboratory.  This laboratory is certified under the Clinical Laboratory Improvement Amendments CLIA as qualified to perform high complexity clinical laboratory testing.    * FEW PSEUDOMONAS AERUGINOSA  Fungus Culture With Stain     Status: None (Preliminary result)   Collection Time: 09/01/24  4:49 PM   Specimen: Bronchial Washing, Right; Respiratory  Result Value Ref Range Status   Fungus Stain Final report  Final    Comment: (NOTE) Performed At: [facility] [address] [place], [place] [id number] Jennette Shorter MD Ey:[id number]    Fungus (Mycology) Culture PENDING  Incomplete   Fungal Source BRONCHIAL ALVEOLAR LAVAGE  Final    Comment: Performed at [facility], [address] [place], [place] [address]  Fungus Culture Result     Status: None   Collection Time: 09/01/24  4:49 PM  Result Value Ref Range Status   Result 1 Comment  Final    Comment: (NOTE) KOH/Calcofluor preparation:  no fungus observed. Performed At: [facility] [address] [place], [place] [id number] Jennette Shorter MD Ey:[id number]   Urine Culture (for pregnant, neutropenic or urologic patients or patients with an indwelling urinary catheter)     Status: None   Collection Time: 09/02/24  2:43 AM   Specimen: Urine, Catheterized  Result Value Ref Range Status   Specimen Description   Final    URINE, CATHETERIZED Performed at [facility], [address], [place], [place] [address]    Special Requests   Final    NONE Performed at [facility], [address] [place], [place] [address]    Culture  Final    NO GROWTH Performed at [facility], [address] [place], [place] [address]    Report Status 09/03/2024 FINAL  Final  MRSA Next Gen by PCR, Nasal     Status: Abnormal   Collection Time: 09/02/24  2:43 AM    Specimen: Nasal Mucosa; Nasal Swab  Result Value Ref Range Status   MRSA by PCR Next Gen DETECTED (A) NOT DETECTED Corrected    Comment: (NOTE) The GeneXpert MRSA Assay (FDA approved for NASAL specimens only), is one component of a comprehensive MRSA colonization surveillance program. It is not intended to diagnose MRSA infection nor to guide or monitor treatment for MRSA infections. Test performance is not FDA approved in patients less than 19 years old. Performed at [facility], [address] [place], [place] [address] CORRECTED ON 12/13 AT 0840: PREVIOUSLY REPORTED AS DETECTED CRITICAL RESULT CALLED TO, READ BACK BY AND VERIFIED WITH: FREDIA FURY RN AT [id number] 09/02/24 RAM   CSF culture w Gram Stain     Status: None   Collection Time: 09/04/24  1:47 PM   Specimen: PATH Cytology CSF; Cerebrospinal Fluid  Result Value Ref Range Status   Specimen Description   Final    CSF Performed at [facility], [address], [place], [place] [address]    Special Requests   Final    NONE Performed at [facility], [address] [place], [place] [address]    Gram Stain   Final    NO ORGANISMS SEEN WBC SEEN RED BLOOD CELLS PRESENT GRAM STAIN REVIEWED-AGREE WITH RESULT DRT    Culture   Final    NO GROWTH 3 DAYS Performed at [facility], [address], [place], [place] [address]    Report Status 09/07/2024 FINAL  Final      Signed: Charlie Patterson, MD 10-04-24 No charge for discharge summary.

## 2024-09-21 NOTE — TOC Progression Note (Signed)
 Transition of Care [facility]) - Progression Note    Patient Details  Name: Haley Deleon MRN: [id number] Date of Birth: 1956/05/02  Transition of Care [facility]) CM/SW Contact  Nathanael CHRISTELLA Ring, RN Phone Number: Sep 30, 2024, 2:08 PM  Clinical Narrative:     Patient is comfort care, expected in hospital death.                     Expected Discharge Plan and Services                                               Social Drivers of Health (SDOH) Interventions SDOH Screenings   Food Insecurity: No Food Insecurity (08/21/2024)  Housing: Unknown (08/25/2024)  Transportation Needs: No Transportation Needs (08/21/2024)  Utilities: Not At Risk (08/21/2024)  Social Connections: Moderately Isolated (08/22/2024)  Tobacco Use: Medium Risk (08/15/2024)    Readmission Risk Interventions     No data to display

## 2024-09-21 DEATH — deceased

## 2024-09-28 LAB — FUNGUS CULTURE RESULT

## 2024-09-28 LAB — FUNGUS CULTURE WITH STAIN

## 2024-09-28 LAB — FUNGAL ORGANISM REFLEX

## 2024-10-03 LAB — FUNGUS CULTURE WITH STAIN

## 2024-10-03 LAB — FUNGUS CULTURE RESULT

## 2024-10-03 LAB — FUNGAL ORGANISM REFLEX
